# Patient Record
Sex: Male | Born: 1950
Health system: Southern US, Community
[De-identification: ages and names within clinical notes are randomized; demographics above are authoritative.]

## PROBLEM LIST (undated history)

## (undated) DIAGNOSIS — R200 Anesthesia of skin: Secondary | ICD-10-CM

## (undated) DIAGNOSIS — Z8619 Personal history of other infectious and parasitic diseases: Secondary | ICD-10-CM

## (undated) DIAGNOSIS — F419 Anxiety disorder, unspecified: Secondary | ICD-10-CM

## (undated) DIAGNOSIS — E041 Nontoxic single thyroid nodule: Secondary | ICD-10-CM

## (undated) DIAGNOSIS — F32A Depression, unspecified: Secondary | ICD-10-CM

## (undated) DIAGNOSIS — E669 Obesity, unspecified: Secondary | ICD-10-CM

## (undated) DIAGNOSIS — I10 Essential (primary) hypertension: Secondary | ICD-10-CM

## (undated) DIAGNOSIS — K5090 Crohn's disease, unspecified, without complications: Secondary | ICD-10-CM

## (undated) DIAGNOSIS — E785 Hyperlipidemia, unspecified: Secondary | ICD-10-CM

## (undated) DIAGNOSIS — Z87442 Personal history of urinary calculi: Secondary | ICD-10-CM

## (undated) DIAGNOSIS — T8859XA Other complications of anesthesia, initial encounter: Secondary | ICD-10-CM

## (undated) DIAGNOSIS — R7303 Prediabetes: Secondary | ICD-10-CM

## (undated) DIAGNOSIS — T4145XA Adverse effect of unspecified anesthetic, initial encounter: Secondary | ICD-10-CM

## (undated) DIAGNOSIS — I219 Acute myocardial infarction, unspecified: Secondary | ICD-10-CM

## (undated) DIAGNOSIS — M1990 Unspecified osteoarthritis, unspecified site: Secondary | ICD-10-CM

## (undated) DIAGNOSIS — I2510 Atherosclerotic heart disease of native coronary artery without angina pectoris: Secondary | ICD-10-CM

## (undated) DIAGNOSIS — H269 Unspecified cataract: Secondary | ICD-10-CM

## (undated) DIAGNOSIS — R0600 Dyspnea, unspecified: Secondary | ICD-10-CM

## (undated) DIAGNOSIS — M2560 Stiffness of unspecified joint, not elsewhere classified: Secondary | ICD-10-CM

## (undated) DIAGNOSIS — G4730 Sleep apnea, unspecified: Secondary | ICD-10-CM

## (undated) DIAGNOSIS — M75102 Unspecified rotator cuff tear or rupture of left shoulder, not specified as traumatic: Secondary | ICD-10-CM

## (undated) DIAGNOSIS — M7552 Bursitis of left shoulder: Secondary | ICD-10-CM

## (undated) DIAGNOSIS — F329 Major depressive disorder, single episode, unspecified: Secondary | ICD-10-CM

## (undated) DIAGNOSIS — M24112 Other articular cartilage disorders, left shoulder: Secondary | ICD-10-CM

## (undated) DIAGNOSIS — J189 Pneumonia, unspecified organism: Secondary | ICD-10-CM

## (undated) HISTORY — PX: KNEE ARTHROSCOPY: SHX127

## (undated) HISTORY — PX: LITHOTRIPSY: SUR834

## (undated) HISTORY — PX: PARTIAL NEPHRECTOMY: SHX414

## (undated) HISTORY — PX: CARDIAC CATHETERIZATION: SHX172

## (undated) HISTORY — PX: SPLENECTOMY: SUR1306

## (undated) HISTORY — PX: BIOPSY THYROID: PRO38

## (undated) HISTORY — PX: CYSTECTOMY: SUR359

---

## 1992-09-13 DIAGNOSIS — I219 Acute myocardial infarction, unspecified: Secondary | ICD-10-CM

## 1992-09-13 HISTORY — DX: Acute myocardial infarction, unspecified: I21.9

## 1996-09-24 HISTORY — PX: CARDIAC CATHETERIZATION: SHX172

## 2000-11-07 ENCOUNTER — Ambulatory Visit (HOSPITAL_COMMUNITY): Admission: RE | Admit: 2000-11-07 | Discharge: 2000-11-07 | Payer: Self-pay | Admitting: Cardiovascular Disease

## 2000-11-07 ENCOUNTER — Encounter: Payer: Self-pay | Admitting: Cardiovascular Disease

## 2000-11-07 HISTORY — PX: CARDIAC CATHETERIZATION: SHX172

## 2001-04-01 ENCOUNTER — Inpatient Hospital Stay (HOSPITAL_COMMUNITY): Admission: EM | Admit: 2001-04-01 | Discharge: 2001-04-03 | Payer: Self-pay | Admitting: Cardiovascular Disease

## 2001-04-01 ENCOUNTER — Emergency Department (HOSPITAL_COMMUNITY): Admission: EM | Admit: 2001-04-01 | Discharge: 2001-04-01 | Payer: Self-pay | Admitting: *Deleted

## 2001-04-01 ENCOUNTER — Encounter: Payer: Self-pay | Admitting: *Deleted

## 2001-04-02 HISTORY — PX: CARDIAC CATHETERIZATION: SHX172

## 2001-04-06 ENCOUNTER — Ambulatory Visit: Admission: RE | Admit: 2001-04-06 | Discharge: 2001-04-06 | Payer: Self-pay | Admitting: Cardiovascular Disease

## 2001-04-24 ENCOUNTER — Encounter: Payer: Self-pay | Admitting: Family Medicine

## 2001-04-24 ENCOUNTER — Inpatient Hospital Stay (HOSPITAL_COMMUNITY): Admission: AD | Admit: 2001-04-24 | Discharge: 2001-04-26 | Payer: Self-pay | Admitting: Pediatrics

## 2001-04-24 ENCOUNTER — Ambulatory Visit (HOSPITAL_COMMUNITY): Admission: RE | Admit: 2001-04-24 | Discharge: 2001-04-24 | Payer: Self-pay | Admitting: Family Medicine

## 2001-05-07 ENCOUNTER — Encounter: Payer: Self-pay | Admitting: Family Medicine

## 2001-05-07 ENCOUNTER — Ambulatory Visit (HOSPITAL_COMMUNITY): Admission: RE | Admit: 2001-05-07 | Discharge: 2001-05-07 | Payer: Self-pay | Admitting: Family Medicine

## 2001-05-08 ENCOUNTER — Emergency Department (HOSPITAL_COMMUNITY): Admission: EM | Admit: 2001-05-08 | Discharge: 2001-05-08 | Payer: Self-pay | Admitting: *Deleted

## 2001-05-08 ENCOUNTER — Encounter: Payer: Self-pay | Admitting: *Deleted

## 2001-05-11 ENCOUNTER — Ambulatory Visit (HOSPITAL_COMMUNITY): Admission: RE | Admit: 2001-05-11 | Discharge: 2001-05-11 | Payer: Self-pay | Admitting: General Surgery

## 2001-05-17 ENCOUNTER — Ambulatory Visit (HOSPITAL_COMMUNITY): Admission: RE | Admit: 2001-05-17 | Discharge: 2001-05-17 | Payer: Self-pay | Admitting: Cardiovascular Disease

## 2001-05-17 ENCOUNTER — Encounter: Payer: Self-pay | Admitting: Cardiovascular Disease

## 2001-05-31 ENCOUNTER — Encounter: Payer: Self-pay | Admitting: Family Medicine

## 2001-05-31 ENCOUNTER — Ambulatory Visit (HOSPITAL_COMMUNITY): Admission: RE | Admit: 2001-05-31 | Discharge: 2001-05-31 | Payer: Self-pay | Admitting: Family Medicine

## 2001-08-18 ENCOUNTER — Emergency Department (HOSPITAL_COMMUNITY): Admission: EM | Admit: 2001-08-18 | Discharge: 2001-08-18 | Payer: Self-pay

## 2001-08-18 ENCOUNTER — Encounter: Payer: Self-pay | Admitting: Emergency Medicine

## 2001-12-04 ENCOUNTER — Encounter: Payer: Self-pay | Admitting: Family Medicine

## 2001-12-04 ENCOUNTER — Ambulatory Visit (HOSPITAL_COMMUNITY): Admission: RE | Admit: 2001-12-04 | Discharge: 2001-12-04 | Payer: Self-pay | Admitting: Family Medicine

## 2001-12-14 ENCOUNTER — Encounter: Payer: Self-pay | Admitting: Urology

## 2001-12-14 ENCOUNTER — Ambulatory Visit (HOSPITAL_COMMUNITY): Admission: RE | Admit: 2001-12-14 | Discharge: 2001-12-14 | Payer: Self-pay | Admitting: Urology

## 2002-02-14 ENCOUNTER — Encounter: Payer: Self-pay | Admitting: Neurosurgery

## 2002-02-18 ENCOUNTER — Encounter: Payer: Self-pay | Admitting: Neurosurgery

## 2002-02-18 ENCOUNTER — Inpatient Hospital Stay (HOSPITAL_COMMUNITY): Admission: RE | Admit: 2002-02-18 | Discharge: 2002-02-21 | Payer: Self-pay | Admitting: Neurosurgery

## 2002-02-18 HISTORY — PX: LUMBAR LAMINECTOMY: SHX95

## 2002-02-18 HISTORY — PX: LUMBAR FUSION: SHX111

## 2002-03-11 ENCOUNTER — Encounter: Payer: Self-pay | Admitting: Urology

## 2002-03-11 ENCOUNTER — Ambulatory Visit (HOSPITAL_COMMUNITY): Admission: RE | Admit: 2002-03-11 | Discharge: 2002-03-11 | Payer: Self-pay | Admitting: Urology

## 2002-03-26 ENCOUNTER — Encounter: Payer: Self-pay | Admitting: *Deleted

## 2002-03-26 ENCOUNTER — Emergency Department (HOSPITAL_COMMUNITY): Admission: EM | Admit: 2002-03-26 | Discharge: 2002-03-26 | Payer: Self-pay | Admitting: *Deleted

## 2002-04-04 ENCOUNTER — Encounter: Payer: Self-pay | Admitting: Internal Medicine

## 2002-04-04 ENCOUNTER — Encounter (HOSPITAL_COMMUNITY): Admission: RE | Admit: 2002-04-04 | Discharge: 2002-05-04 | Payer: Self-pay | Admitting: Internal Medicine

## 2002-04-24 ENCOUNTER — Ambulatory Visit (HOSPITAL_COMMUNITY): Admission: RE | Admit: 2002-04-24 | Discharge: 2002-04-24 | Payer: Self-pay | Admitting: Family Medicine

## 2002-04-24 ENCOUNTER — Encounter: Payer: Self-pay | Admitting: Family Medicine

## 2002-05-06 ENCOUNTER — Encounter: Payer: Self-pay | Admitting: Family Medicine

## 2002-05-06 ENCOUNTER — Ambulatory Visit (HOSPITAL_COMMUNITY): Admission: RE | Admit: 2002-05-06 | Discharge: 2002-05-06 | Payer: Self-pay | Admitting: Family Medicine

## 2002-05-10 ENCOUNTER — Encounter: Payer: Self-pay | Admitting: Urology

## 2002-05-10 ENCOUNTER — Ambulatory Visit (HOSPITAL_COMMUNITY): Admission: RE | Admit: 2002-05-10 | Discharge: 2002-05-10 | Payer: Self-pay | Admitting: Urology

## 2002-05-13 ENCOUNTER — Ambulatory Visit (HOSPITAL_COMMUNITY): Admission: RE | Admit: 2002-05-13 | Discharge: 2002-05-13 | Payer: Self-pay | Admitting: Internal Medicine

## 2002-08-22 ENCOUNTER — Encounter: Payer: Self-pay | Admitting: Urology

## 2002-08-22 ENCOUNTER — Ambulatory Visit (HOSPITAL_COMMUNITY): Admission: RE | Admit: 2002-08-22 | Discharge: 2002-08-22 | Payer: Self-pay | Admitting: Urology

## 2003-01-30 ENCOUNTER — Ambulatory Visit (HOSPITAL_COMMUNITY): Admission: RE | Admit: 2003-01-30 | Discharge: 2003-01-30 | Payer: Self-pay | Admitting: Internal Medicine

## 2003-07-14 ENCOUNTER — Ambulatory Visit (HOSPITAL_COMMUNITY): Admission: RE | Admit: 2003-07-14 | Discharge: 2003-07-14 | Payer: Self-pay | Admitting: Internal Medicine

## 2003-08-28 ENCOUNTER — Ambulatory Visit (HOSPITAL_COMMUNITY): Admission: RE | Admit: 2003-08-28 | Discharge: 2003-08-28 | Payer: Self-pay | Admitting: Orthopedic Surgery

## 2003-08-28 HISTORY — PX: CARPAL TUNNEL RELEASE: SHX101

## 2003-11-05 ENCOUNTER — Ambulatory Visit (HOSPITAL_COMMUNITY): Admission: RE | Admit: 2003-11-05 | Discharge: 2003-11-06 | Payer: Self-pay | Admitting: Otolaryngology

## 2003-11-05 HISTORY — PX: CAUTERY OF TURBINATES: SHX1315

## 2003-11-05 HISTORY — PX: NASAL SEPTUM SURGERY: SHX37

## 2003-12-18 ENCOUNTER — Ambulatory Visit (HOSPITAL_BASED_OUTPATIENT_CLINIC_OR_DEPARTMENT_OTHER): Admission: RE | Admit: 2003-12-18 | Discharge: 2003-12-18 | Payer: Self-pay | Admitting: Otolaryngology

## 2004-05-18 ENCOUNTER — Ambulatory Visit (HOSPITAL_COMMUNITY): Admission: RE | Admit: 2004-05-18 | Discharge: 2004-05-18 | Payer: Self-pay | Admitting: Internal Medicine

## 2007-02-14 ENCOUNTER — Ambulatory Visit (HOSPITAL_COMMUNITY): Admission: RE | Admit: 2007-02-14 | Discharge: 2007-02-14 | Payer: Self-pay | Admitting: Internal Medicine

## 2007-12-24 ENCOUNTER — Inpatient Hospital Stay (HOSPITAL_COMMUNITY): Admission: EM | Admit: 2007-12-24 | Discharge: 2007-12-26 | Payer: Self-pay | Admitting: Emergency Medicine

## 2008-12-01 ENCOUNTER — Ambulatory Visit: Payer: Self-pay | Admitting: Orthopedic Surgery

## 2008-12-01 DIAGNOSIS — M19049 Primary osteoarthritis, unspecified hand: Secondary | ICD-10-CM | POA: Insufficient documentation

## 2008-12-01 DIAGNOSIS — M7150 Other bursitis, not elsewhere classified, unspecified site: Secondary | ICD-10-CM | POA: Insufficient documentation

## 2009-01-12 ENCOUNTER — Ambulatory Visit: Payer: Self-pay | Admitting: Orthopedic Surgery

## 2009-03-05 HISTORY — PX: TRANSTHORACIC ECHOCARDIOGRAM: SHX275

## 2009-08-17 ENCOUNTER — Ambulatory Visit: Payer: Self-pay | Admitting: Orthopedic Surgery

## 2009-08-18 ENCOUNTER — Encounter (INDEPENDENT_AMBULATORY_CARE_PROVIDER_SITE_OTHER): Payer: Self-pay | Admitting: *Deleted

## 2009-09-08 ENCOUNTER — Ambulatory Visit (HOSPITAL_COMMUNITY): Admission: RE | Admit: 2009-09-08 | Discharge: 2009-09-08 | Payer: Self-pay | Admitting: Family Medicine

## 2010-03-11 NOTE — Assessment & Plan Note (Signed)
Summary: RE-CHECK LT HAND/BRACE/UHC/CAF   Visit Type:  Follow-up  CC:  recheck left hand.  History of Present Illness: I saw [name] in the office today for a 6 WEEK  followup visit.  He is a 60 years old man with the complaint of:  LEFT HAND pain in his thumb with triggering of the RIGHT ring finger and LEFT ring finger  DX:  Arthritis and Trigger finger. [ring]  Treatment:  Injection and brace  MEDS: Aleve., ASA, Amlodipine, Ramipal, Celexa, Clonidine, Metoprolol, Zetia, MV, Zyrtec, Gemfibrozil.  Today, scheduled for: Recheck after one month of bracing and Aleve two times a day.  he has basal joint arthritis on x-ray and symptoms  Also has 2 trigger fingers RIGHT worse and LEFT  I treated him with bracing, injection and anti-inflammatories and is not improving.  I will go ahead and inject his RIGHT ring finger and the LEFT one is not completely locking so we will await.  Allergies: 1)  ! Penicillin  Physical Exam  Additional Exam:  GEN: well developed and nourished. normal body habitus, grooming and hygiene  SKIN: normal without lesions, masses, nodules NEURO/PSYCH: Normal coordination, reflexes and sensation. awake alert and oriented    LEFT  hand thumb tenderness over the base of the metacarpal with positive grind test decreased range of motion normal muscle tone in the thenar eminence stability is normal.  No swelling.  Crepitance found on range of motion.  RIGHT ring finger triggering pain over the A1 pulley catching locking tenderness.  Alignment normal.  Pulses perfusion normal.  Capillary refill normal.  Full range of motion noted.     Impression & Recommendations:  Problem # 1:  HAND, ARTHRITIS, DEGEN./OSTEO (ICD-715.94) Assessment Deteriorated  Orders: Orthopedic Surgeon Referral (Ortho Surgeon) Est. Patient Level III (99213)/Hand surgery  Problem # 2:  TRIGGER FINGER (ICD-727.3) Assessment: Comment Only  inject RIGHT ring trigger finger Verbal  consent was obtained: The finger was prepped with ethyl chloride and injected with 1:1 injection of .25% sensorcaine, 1cc  and 40 mg of depomedrol, 1cc. There were no complications.  Orders: Est. Patient Level III (21308) Injection, Tendon / Ligament (65784) Depo- Medrol 40mg  (J1030)  Patient Instructions: 1)  Refer to Dr. Amanda Pea for basilar joint arthritis left thumb 2)  Take xrays with you for appointment with the hand specialist  3)  You have received an injection of cortisone today. You may experience increased pain at the injection site. Apply ice pack to the area for 20 minutes every 2 hours and take 2 xtra strength tylenol every 8 hours. This increased pain will usually resolve in 24 hours. The injection will take effect in 3-10 days.  4)  If the other finger locks then I would recommend injection in it as well

## 2010-03-11 NOTE — Miscellaneous (Signed)
Summary: faxed referral to Dr. Amanda Pea  Clinical Lists Changes

## 2010-03-11 NOTE — Assessment & Plan Note (Signed)
Summary: left hand pain/no xr done/uhc/bsf   Vital Signs:  Patient profile:   60 year old male Weight:      210 pounds Pulse rate:   88 / minute Resp:     18 per minute  Vitals Entered By: Fuller Canada MD (December 01, 2008 4:07 PM)  Visit Type:  Initial     CC:  left hand.  History of Present Illness: I saw [name] in the office today for an initial visit.  He is a 60 years old man with the complaint of:  chief complaint: pain in left hand, thumb and wrist area. Also has trigger finger right ring finger.  pain -duration 6 months, no injury.  -location left hand, thumb and wrist area.  -severity [1-10] 9  -worsened by: has pain turning steering wheel, turning door knobs.  -improved by:  nothing.  -context:  sharp pain.  -other symptoms has numbness whole hand since last night. No bracing or injections.  -xrays done & where:  today in our office.  Meds: Amlodipine, Ramipril, Clonidine, ASA, Citalopram, Metoprolol, Zetia, MV, Gemfibrozil.  Allergies (verified): 1)  ! Penicillin  Past History:  Past Medical History: heart attack 1994 htn chest pains cholesterol depression hearing loss  Past Surgical History: spllen removed partial kidney removed pylenodal cysts bilateral CTR back surgery 2004 nasal surgery 2005 rt knee arthroscopy 2008.  Family History: Family History of Diabetes Family History Coronary Heart Disease male < 72 Family History of Arthritis Hx, family, chronic respiratory condition  Social History: Patient is married.  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 Cardiac :  Complains of heart attack; denies chest pain, angina, heart failure, poor circulation, blood clots, and phlebitis. Resp:  Complains of short of breath; denies difficulty breathing, COPD, cough, and  pneumonia. GI:  Complains of constipation and reflux; denies nausea, vomiting, diarrhea, difficulty swallowing, ulcers, and GERD. GU:  Denies kidney failure, kidney transplant, kidney stones, burning, poor stream, testicular cancer, blood in urine, and ; dribbling. Neuro:  Complains of headache and numbness; denies dizziness, migraines, weakness, tremor, and unsteady walking. MS:  Complains of joint pain and joint swelling; denies rheumatoid arthritis, gout, bone cancer, osteoporosis, and . Endo:  Denies thyroid disease, goiter, and diabetes. Psych:  Complains of depression; denies mood swings, anxiety, panic attack, bipolar, and schizophrenia. Derm:  Complains of eczema and itching; denies cancer. EENT:  Complains of ears ringing; denies poor vision, cataracts, glaucoma, poor hearing, vertigo, sinusitis, hoarseness, toothaches, and bleeding gums; hearing loss. Immunology:  Denies seasonal allergies, sinus problems, and allergic to bee stings. Lymphatic:  Denies lymph node cancer and lymph edema.  Physical Exam  Additional Exam:  GEN: well developed and nourished. normal body habitus, grooming and hygiene CDV: normal pulses perfusion color and temperature without swelling or edema Lymph: normal lymph system SKIN: normal without lesions, masses, nodules NEURO/PSYCH: Normal coordination, reflexes and sensation. awake alert and oriented  XBJ:[id number] normal  RIGHT hand thumb tenderness over the base of the metacarpal with positive grind test decreased range of motion normal muscle tone in the thenar eminence stability is normal.  No swelling.  Crepitance found on range of motion.  RIGHT ring finger triggering pain over the A1 pulley catching locking tenderness.  Alignment normal.  Pulses perfusion normal.  Capillary refill normal.  Full range of motion noted.  Impression & Recommendations:  Problem # 1:  HAND, ARTHRITIS, DEGEN./OSTEO (ICD-715.94)  Orders: New Patient Level IV  (16109) Hand x-ray, minimum 3 views (73130) radiographs do show that there are degenerative changes at the base of the first metacarpal and the carpal joint  Impression osteoarthritis carpal joint first digit/thumb.  Problem # 2:  TRIGGER FINGER (ICD-727.3)  Orders: New Patient Level IV (60454) Depo- Medrol 40mg  (J1030) Joint Aspirate / Injection, Small (20600)inject the LEFT trigger finger  Verbal consent was obtained: The finger was prepped with ethyl chloride and injected with 1:1 injection of .25% sensorcaine, 1cc  and 40 mg of depomedrol, 1cc. There were no complications.  Hand x-ray, minimum 3 views (73130)  Patient Instructions: 1)  You have received an injection of cortisone today. You may experience increased pain at the injection site. Apply ice pack to the area for 20 minutes every 2 hours and take 2 xtra strength tylenol every 8 hours. This increased pain will usually resolve in 24 hours. The injection will take effect in 3-10 days.  2)  brace left thumb during non work hours  3)  take aleve two times a day for 4 weeks  4)  return in 6 weeks

## 2010-03-11 NOTE — Letter (Signed)
Summary: History form  History form   Imported By: Jacklynn Ganong 12/03/2008 12:43:51  _____________________________________________________________________  External Attachment:    Type:   Image     Comment:   External Document

## 2010-06-22 NOTE — H&P (Signed)
[name], [name] NO.:  [id number]   MEDICAL RECORD NO.:  [id number]          PATIENT TYPE:  EMS   LOCATION:  MAJO                         FACILITY:  [facility]   PHYSICIAN:  [name], M.D.   DATE OF BIRTH:  January 21, 1951   DATE OF ADMISSION:  12/24/2007  DATE OF DISCHARGE:                              HISTORY & PHYSICAL   HISTORY OF PRESENT ILLNESS:  [name] is a 60 year old male who is  followed by Dr. Alanda Mckee and Dr. Sherwood Mckee in [place].  He has a history  of coronary disease.  He had a remote PMI treated with PTCA in 1994.  Restudy in 2003 showed total occluded distal right with left-to-right  collaterals.  He did have some moderate disease in the LAD of 40% with  good LV function.  He had a Myoview in June 2007 that showed no ischemia  with an EF of 54%.  He does say he has occasional chest pain although it  is hard for him to say this is always exertional or typical.  He says  once in awhile he has to take a nitroglycerin.  He works as a Curator.  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  He got nauseated and mildly  flushed.  He went to the bathroom and took a nitroglycerin with partial  relief.  He went by to see the office nurse and is now seen in the  emergency room at [facility].  Currently he is pain free.  He is admitted now  for further evaluation.  He denies any recent increase in his angina  although this episode was unusual for him.   PAST MEDICAL HISTORY:  Remarkable for treated hypertension.  He has  dyslipidemia with a history of statin intolerance.   CURRENT MEDICATIONS:  Aspirin 81 mg a day, Altace 10 mg a day, Celexa 10  mg a day, clonidine 0.1 mg b.i.d., fish oil 1 gram b.i.d., multivitamin  daily, Tricor 145 daily, Zetia 10 mg a day and Crestor 5 mg once a week.   ALLERGIES:  He is allergic to PENICILLIN and intolerant to STATINS and  NIACIN.   SOCIAL HISTORY:  He is  married and he has two children, three  grandchildren.  He quit smoking in 1994 when he had his heart attack.  He works as a Curator at [employer].   FAMILY HISTORY:  Unremarkable.   REVIEW OF SYSTEMS:  Unremarkable except for above.  He denies any GI  bleeding or melena.  Has not had thyroid disease or diabetes.   PHYSICAL EXAMINATION:  VITAL SIGNS:  Blood pressure 122/68, pulse 62,  temperature 98.1, O2 sat is 97% on 2 liters.  GENERAL:  He is well-developed, well-nourished male in no acute  distress.  HEENT: Normocephalic.  Extraocular movements are intact.  Sclerae is  nonicteric.  Conjunctivae are within normal limits.  NECK:  Without bruit and without JVD.  CHEST: Clear to auscultation and percussion.  CARDIAC EXAM:  Reveals regular rate and rhythm without murmur, rub  or  gallop.  Normal S1 and S2.  ABDOMEN:  Nontender.  No hepatosplenomegaly.  EXTREMITIES:  Without edema.  Distal pulses are intact.  There are no  femoral artery bruits noted.  NEURO:  Exam grossly intact.  He is awake, alert and oriented and  operative.  Moves all extremities without obvious deficit.  SKIN:  Skin is warm and dry.   EKG shows sinus rhythm without acute changes.   LABS:  Sodium 139, potassium 4.0, BUN 14, creatinine 1.2, hemoglobin  14.6, hematocrit 43, Troponin is negative times one.   IMPRESSION:  1. Unstable angina.  2. Known coronary disease with remote PMI treated with percutaneous      transluminal coronary angioplasty in 1994, catheterization in 2003      showing occluded right coronary artery with left-to-right      collaterals and 40% left anterior descending, treated medically,      last Myoview was unremarkable for significant ischemia in 2007.  3. Dyslipidemia with history of statin intolerance.  4. Hypertension, currently treated.  5. Remote smoker.   PLAN:  The patient was seen by Dr. Allyson Mckee and myself today in the office.  He will be admitted to telemetry, started on  heparin and nitrates.  We  will go ahead and set him up for restudy tomorrow.      [name], P.A.      [name], M.D.  Electronically Signed    LKK/MEDQ  D:  12/24/2007  T:  12/24/2007  Job:  [id number]

## 2010-06-22 NOTE — Cardiovascular Report (Signed)
NAMENAJEE, MANNINEN NO.:  [id number]   MEDICAL RECORD NO.:  [id number]          PATIENT TYPE:  INP   LOCATION:  3738                         FACILITY:  [facility]   PHYSICIAN:  Nicki Guadalajara, M.D.     DATE OF BIRTH:  1950-04-02   DATE OF PROCEDURE:  DATE OF DISCHARGE:                            CARDIAC CATHETERIZATION   PROCEDURES:  Left heart catheterization:  Cine coronary angiography;  biplane cine left ventriculography; distal aortography.   INDICATIONS:  [name] is a 60 year old gentleman who suffered an  inferior wall myocardial infarction in 1994, treated with PTCA of his  right coronary artery.  He subsequently developed silent re-occlusion  and was found to have extensive collaterals on subsequent  catheterization studies.  His last catheterization was done by Dr.  Alanda Amass in February 2003.  The patient had been doing well on medical  therapy.  He recently developed an episode of substernal chest tightness  while at work radiating to his jaw.  He was admitted to [facility] on December 24, 2007.  Cardiac enzymes have been negative.  He  is now referred for definitive repeat cardiac catheterization.   PROCEDURE:  After premedication with Valium 5 mg intravenously, the  patient was prepped and draped in the usual fashion.  His right femoral  artery was punctured anteriorly and a 5-French sheath was inserted.  Diagnostic catheterization was done utilizing 5-French Judkins for left  and right coronary catheters.  A 5-French pigtail catheter was used for  biplane cine left ventriculography as well as distal aortography.  Hemostasis was obtained by direct manual pressure.  The patient  tolerated the procedure well.   HEMODYNAMIC DATA:  Central aortic pressure was 170/96.  Left ventricular  pressure was 170/14.   ANGIOGRAPHIC DATA:  Left main coronary artery was angiographically  normal and bifurcated into a large LAD and left circumflex  system.   The LAD was a moderate-sized vessel, which extended to and wrapped  around the LV apex.  Gave rise to a proximal diagonal vessel, which had  smooth 20% proximal narrowing.  The first septal perforating artery was  a large vessel.  The LAD extended to and wrapped around the LV apex.  There was collateralization to the PLA branch of the distal RCA via the  LAD.   The circumflex vessel was angiographically normal and gave rise to 2  small marginal vessels as well as a large marginal vessel, which  extended to the apex.  The AV groove circumflex as well as an additional  branch also supplied collaterals to the distal right coronary artery and  well visualized were a PLA, 2 PD branches, and there was filling of the  RCA to the mid segment.   The right coronary artery was a moderate-sized vessel, which was totally  occluded after giving rise to a marginal branch in the mid segment.  There was 40% proximal RCA narrowing and 40-50% narrowing in the RCA  prior to giving rise to this branch and then total occlusion.  As noted  above, there was brisk left-to-right  collaterals to the distal RCA via  the left injection.   Biplane cine left ventriculography revealed preserved global  contractility with an ejection fraction of at least 50-55%, but there  was mid-to-basal hypocontractility on the RAO projection and  hypocontractility in the inferoapical segment on the LAO projection.   Distal aortography revealed widely patent renal arteries without renal  artery stenosis.  There was no evidence for any aneurysm formation in  the abdominal aorta.  The iliac system was suboptimally visualized, but  no definitive stenosis was demonstrated.   IMPRESSION:  1. Preserved global contractility with mild mid-to-basal inferior and      inferoapical hypocontractility.  This patient is status post remote      inferior wall myocardial infarction in August 1994 treated with      percutaneous  transluminal coronary angioplasty of his right      coronary artery.  2. Minimal 20% narrowing in the first diagonal branch of the left      anterior descending, but otherwise, normal left anterior descending      system.  3. Large normal left circumflex system.  4. Total occlusion of the mid right coronary artery with extensive      left-to-right collaterals from at least 3 resources rising from the      left anterior descending and 2 branches of the circumflex vessel      supplying the distal right coronary artery, which is collateralized      essentially up to its point of near occlusion in its midsegment.  5. No evidence for renal artery stenosis.  6. Systemic hypertension.   RECOMMENDATIONS:  Medical therapy.           ______________________________  Nicki Guadalajara, M.D.     TK/MEDQ  D:  12/25/2007  T:  12/25/2007  Job:  [id number]   cc:   Gerlene Burdock A. Alanda Amass, M.D.  Madelin Rear. Sherwood Gambler, MD

## 2010-06-25 NOTE — H&P (Signed)
[facility]  Patient:    [name], [name] Visit Number: [id number] MRN: [id number]          Service Type: OUT Location: RAD Attending Physician:  Darlin Priestly Dictated by:   Franky Macho, M.D. Admit Date:  05/07/2001   CC:         Patrica Duel, M.D.   History and Physical  AGE:  60 years old.  CHIEF COMPLAINT:  Dysphagia, hematochezia.  HISTORY OF PRESENT ILLNESS:  The patient is a 60 year old white male who is referred for endoscopic evaluation.  He has had multiple episodes of chest pain and dysphagia recently.  He has had two cardiac catheterizations, both of which were unremarkable.  He was seen in the emergency room earlier this week with chest pain which radiates to his shoulders.  He has had some swallowing difficulties.  No significant heartburn has been noted.  It feels like something gets stuck in his throat.  No nausea, vomiting, epigastric pain, fever, or chills have been noted.  No melena has been noted.  He has passed blood in the stools in the past.  He has never had a colonoscopy.  He denies any hemorrhoidal problems.  There is no family history of colon carcinoma.  PAST MEDICAL HISTORY: 1. Hypertension. 2. Coronary artery disease.  PAST SURGICAL HISTORY: 1. Splenectomy and partial kidney removal in the 1960s. 2. Pilonidal cyst excision.  CURRENT MEDICATIONS: 1. Toprol XL 50 mg p.o. q.d. 2. Baby aspirin 1 tablet p.o. q.d. 3. Imdur 30 mg p.o. q.d. 4. Altace 2.5 mg p.o. q.d. 5. Norvasc 5 mg p.o. q.d. 6. Lexapro 10 mg p.o. q.d. 7. Nexium 1 tablet p.o. b.i.d.  ALLERGIES:  PENICILLIN and NIACIN.  REVIEW OF SYSTEMS:  As noted above.  SOCIAL HISTORY:  The patient denies drinking or smoking.  PHYSICAL EXAMINATION:  GENERAL:  Well-developed, well-nourished white male in no acute distress.  VITAL SIGNS:  Afebrile, vital signs stable.  NECK:  Supple.  Without lymphadenopathy.  LUNGS:  Clear to auscultation, with  equal breath sounds bilaterally.  HEART:  Regular rate and rhythm without S3, S4, or murmurs.  ABDOMEN:  Soft, nontender, nondistended.  No hepatosplenomegaly or masses are noted.  RECTAL:  Deferred until the procedure.  IMPRESSION:  Dysphagia, chest pain of noncardiac nature, hematochezia.  PLAN:  The patient is scheduled for an EGD and colonoscopy on May 11, 2001. The risks and benefits of the procedure including bleeding and perforation were fully explained to the patient, who gave informed consent. Dictated by:   Franky Macho, M.D. Attending Physician:  Darlin Priestly DD:  05/10/01 TD:  05/10/01 Job: [id number]

## 2010-06-25 NOTE — Procedures (Signed)
   [name], [name]                           ACCOUNT NO.:  [id number]   MEDICAL RECORD NO.:  [id number]                   PATIENT TYPE:  EMS   LOCATION:  ED                                   FACILITY:  [facility]   PHYSICIAN:  Edward L. Juanetta Gosling, M.D.             DATE OF BIRTH:  January 11, 1951   DATE OF PROCEDURE:  03/26/2002  DATE OF DISCHARGE:  03/26/2002                                EKG INTERPRETATION   TIME:  1203 hours   DATE:  03/26/02   INTERPRETATION:  The rhythm is sinus rhythm with a rate in the 60s.  There  are small Q waves inferiorly which may indicate a previous inferior  myocardial infarction but these are rather small and may be of no  significance.   IMPRESSION:  Clinical correlation is suggested.                                                 Edward L. Juanetta Gosling, M.D.    ELH/MEDQ  D:  03/26/2002  T:  03/26/2002  Job:  [id number]

## 2010-06-25 NOTE — Op Note (Signed)
NAMENESHAWN, AIRD                           ACCOUNT NO.:  [id number]   MEDICAL RECORD NO.:  [id number]                   PATIENT TYPE:  AMB   LOCATION:  DAY                                  FACILITY:  [facility]   PHYSICIAN:  Vickki Hearing, M.D.           DATE OF BIRTH:  [birth year]   DATE OF PROCEDURE:  DATE OF DISCHARGE:                                 OPERATIVE REPORT   PREOPERATIVE DIAGNOSIS:  Carpal tunnel syndrome, right upper extremity.   POSTOPERATIVE DIAGNOSIS:  Carpal tunnel syndrome, right upper extremity.   PROCEDURE:  Open right carpal tunnel release.   SURGEON:  Vickki Hearing, M.D.   ANESTHESIA:  Regional block using a Bier block technique.   FINDINGS:  Stenosis of the carpal tunnel, compression of the median nerve.  Otherwise, the carpal tunnel contents were normal.   HISTORY:  This is a 60 year old male with tingling and numbness in the  median nerve distribution of the right upper extremity.  He had a nerve  conduction study confirming his carpal tunnel syndrome.  He had underlying  axonal neuropathy with some mild-to-moderate amplitude latency on the motor  side of the test.  He presented for carpal tunnel release.  Primary  indication was pain and weakness.   DESCRIPTION OF PROCEDURE:  Mr. Laboy was identified in the preoperative  holding area.  He marked his operative site.  I signed my initials over the  operative site.  He was given Ancef and taken to the operating room for a  Bier block.  After successful anesthesia, the required timeout was taken.  Everyone agreed with the procedure, extremity, and the patient's identity as  Riker Collier.   We made an incision over the carpal tunnel and divided the subcutaneous  tissue down in the palmar fascia.  The palmar fascia was dissected until the  transverse carpal ligament was identified at its distal extent.  Blunt  dissection was performed beneath the ligament, and the ligament was  released.  The  median nerve was found.  Findings are as noted.  The carpal  tunnel was irrigated, and the wound was closed with a running 3-0 nylon  suture.  We then injected 10 cc of 0.5% Sensorcaine plain, covered the wound  with sterile dressings, and released the tourniquet.  The fingers had good  capillary refill and perfusion.   The patient was taken to the recovery room in stable condition.   Follow up will be on Monday for dressing change.  He will be discharged on  Lorcet Plus one q.4h. p.r.n., #60 with two refills.      ___________________________________________                                            Vickki Hearing, M.D.   SEH/MEDQ  D:  08/28/2003  T:  08/28/2003  Job:  [id number]

## 2010-06-25 NOTE — Op Note (Signed)
[name], [name]                 ACCOUNT NO.:  [id number]   MEDICAL RECORD NO.:  [id number]          PATIENT TYPE:  OIB   LOCATION:  2908                         FACILITY:  [facility]   PHYSICIAN:  Kinnie Scales. Annalee Genta, M.D.DATE OF BIRTH:  22-Nov-1950   DATE OF PROCEDURE:  11/05/2003  DATE OF DISCHARGE:                                 OPERATIVE REPORT   PREOPERATIVE DIAGNOSES:  1.  Deviated nasoseptum with nasal obstruction.  2.  Turbinates hypertrophy.  3.  Severe, obstructive sleep apnea.  4.  History of coronary artery disease.   POSTOPERATIVE DIAGNOSES:  1.  Deviated nasoseptum with nasal obstruction.  2.  Turbinates hypertrophy.  3.  Severe, obstructive sleep apnea.  4.  History of coronary artery disease.   PROCEDURE:  1.  Nasoseptoplasty.  2.  Bilateral inferior turbinates intramural cautery.   ANESTHESIA:  General endotracheal.   SURGEON:  Kinnie Scales. Annalee Genta, M.D.   COMPLICATIONS:  None.   ESTIMATED BLOOD LOSS:  Less than 50 cc.   DISPOSITION:  The patient was transferred from the operating room to the  recovery room in stable condition.   INDICATIONS FOR PROCEDURE:  1.  Deviated nasoseptum with nasal obstruction.  2.  Turbinates hypertrophy.  3.  Severe, obstructive sleep apnea.  4.  History of coronary artery disease.   Mr. Highley is a 60 year old white male who is referred for evaluation of nasal  airway obstruction and obstructive sleep apnea.  The patient was diagnosed  with severe obstructive sleep apnea and significant past medical history of  coronary artery disease.  He was titrated on CPAP. He has been wearing the  CPAP on a consistent basis but was having significant difficulty secondary  to nasal airway obstruction. Given his history and examination, I  recommended that we consider him for nasoseptoplasty and turbinates  reduction. The risks, benefits and possible complications of procedure were  discussed in detail with the patient and his family, who  understood and  concurred with our plan for surgery, which was scheduled as above.   DESCRIPTION OF PROCEDURE:  The patient was brought to the operating room on  November 05, 2003 and placed in the supine position on the operating table.  General endotracheal anesthesia was established without difficulty. When the  patient was adequately anesthetized, his nose was injected with 7 cc of 1%  lidocaine with 1:100,000 solution epinephrine, injected in a submucosal  fashion along the nasoseptum and inferior turbinates bilaterally.  The  patient's nose was then packed with Afrin soaked cottonoid pledgets, which  were left in place for approximately 10 minutes to allow for  vasoconstriction and hemostasis.  The patient was prepped and draped in a  sterile fashion, and surgical procedure was begun with bilateral nasal  exams.  [name] has a severely deviated septum with left septal deviation  and significant septal spurring on the right, with bilateral inferior  turbinates hypertrophy.   The procedure was begun by creating a right hemitransfixion incision.  Incision was carried through the mucosa and underlying submucosa and a  mucoperiosteal flap was elevated from anterior  to posterior on the patient's  right-hand side.  Bony cartilaginous junction was crossed in the midline and  a mucoperiosteal flap was elevated on the patient's left.  Deviated bone and  cartilage in the mid and posterior aspect of the nasoseptum were then  resected using a swivel knife and through-cutting forceps.  A large, septal  spur was identified and the overlying mucosa was elevated and preserved.  A  4 mm osteotome was used to remove the bony, septal spur.  With septum  brought to the midline, mid-septal cartilage was morcellized and returned to  the mucoperichondrial pocket.  The mucoperichondrial flaps were  reapproximated with a 4-0 gut suture on a Keith in [employer]  fashion.  Bilateral Doyle nasal  splints were placed after the application of  Bactroban ointment and sutured in position with a 3-0 Ethilon suture.   Bilateral inferior turbinates intramural cautery was then performed with the  cautery set at 12 watts. Two passes were made in a submucosal fashion in  each inferior turbinate. When the inferior turbinates had been adequately  cauterized, they were out-fractured to create a more patent nasal cavity.  Patient's nasal cavity, oral cavity, nasopharynx, oropharynx were then  irrigated and suctioned. An orogastric tube was passed and the stomach  contents were aspirated.  The patient was then awakened from his anesthetic,  extubated and transferred from the operating room to the recovery room in  stable condition. There were no complications, and blood loss was  approximately 50 cc.       DLS/MEDQ  D:  08/65/7846  T:  11/05/2003  Job:  [id number]

## 2010-06-25 NOTE — Cardiovascular Report (Signed)
[place]  Patient:    [name], [name] Visit Number: [id number] MRN: [id number]          Service Type: MED Location: 2000 2099 [id number] Attending Physician:  Virgina Evener Dictated by:   Pearletha Furl Alanda Amass, M.D. Proc. Date: 04/02/01 Admit Date:  04/01/2001   CC:         CP Lab  Lennette Bihari, M.D.  Record Room  Dr. Jodelle Green c/o [facility] in [place], [place]   Cardiac Catheterization  PROCEDURE:  Retrograde central aortic catheterization, selective coronary angiography by Judkins technique, LV angiogram, RAO and LAO projection.  A 6 French Perclose single stitch CRFA closure device deployed successfully.  DESCRIPTION OF PROCEDURE:  The patient was brought to the second floor CP lab in a post-absorptive state after 5 mg of Valium p.o. premedication.  He was hydrated preoperatively with normal creatinine.  Heparin was on hold.  The right groin was prepped and draped in the usual manner.  Xylocaine 1% was used as local anesthesia.  The CRFA was entered with a single anterior puncture using an 18 thin wall needle, and a 6 French short dage sidearm sheath was inserted without difficulty.  Catheterization was performed with 6 French 4 cm taper pre-form Cortis coronary pigtail catheters using Omnipaque dye.  The LV angiogram was done in the RAO and LAO projection at 25 cc, 14 cc per second, and 20 cc, 12 cc per second, respectively.  Pullback pressure of the CA showed no gradient across the aortic valve.  Catheter was removed.  Sidearm sheath was flushed, pending review of the patients ______ angiograms.  Following this, hand injection of the CRFA was done in the angled projection to confirm common femoral puncture, and then the 6 French Perclose device was deployed with single suture technique successfully.  The patient was transferred to the holding area for postoperative care in stable condition with intact  RLE pulses.  PRESSURES: 1. LV:  110/0; LVEDP 18 mmHg. 2. CA:  110/70 mmHg. 3. There was no gradient across the aortic valve on catheter pull back.  Fluoroscopy revealed +1 proximal left anterior descending artery and right coronary artery calcifications.  There was no intracardiac or valvular calcification seen.  LV angiogram in the RAO and LAO projection showed hypo-akinesis of the basilar third to one half of the inferior wall, and no significant abnormality in the LAO projection.  Ejection fraction greater than 55% with no mitral regurgitation.  Prior to catheterization revealed normal renal arteries and IMAs (10/02).  The main left coronary is normal.  The left anterior descending artery has a 30 to 40% smooth mildly segmental narrowing at the junction of the proximal mid third after the moderately large first diagonal branch.  There was another tandem eccentric 40% narrowing just beyond this that was smooth and essentially unchanged.  There was 50% narrowing with mild hypodensity in the very distal quarter of the left anterior descending artery before the apical bifurcation.  The first diagonal was moderately large.  There was 30 to 40% narrowing before the bifurcation.  The circumflex was a moderately large vessel.  There was a small first marginal that was normal.  A small second marginal that was normal.  There was 30% narrowing of the right coronary artery after the second marginal.  The distal right coronary artery bifurcated with no significant stenosis, and was a predominant marginal branch.  There were three PABG branches.  One supplied direct collaterals to the  distal right coronary artery, and the other one supplied indirect collaterals.  The PLA and PDA of the RCA were visualized up to the bifurcation (grade III collaterals).  The RCA showed 70% narrowing, smooth before the acute marginal branch.  The acute marginal branch had a 70% proximal narrowing.   Beyond this, was 99 then 100% narrowing in a recanalization area.  There was only faint filling up to the acute margin, but the vessel appeared to be diffusely diseased up to the distal vessel.  This 60 year old white married father of two with three grandchildren quit smoking in 1994.  Still works for [employer] [name].  He suffered a DMI in 1994, treated with emergency percutaneous transluminal coronary angioplasty.  He had subsequently silent reocclusion following that, evident on past angiography. He has a history of intermittent chest pain, gastroesophageal reflux disease, remote cigarette abuse, hyperlipidemia, and hypertension.  He was admitted with chest pain on 04/02/01, with myocardial infarction ruled out by serial enzymes and EKGs.  There was no significant change on angiography from prior angiogram of 11/07/00.  There were non-jeopardized collaterals to the distal right coronary artery.  His distal right coronary artery does not appear to be a candidate for possible recanalization of his CTO because of diffuse nature of the disease as evident angiographically.  He has noncritical left coronary disease, and was recommend reassurance.  He may need to be considered for GI evaluation if he has recurrent chest pain.  Overall ejection fraction is well preserved.  Continue medical therapy.  CATHETERIZATION DIAGNOSES: 1. Arteriosclerotic heart disease, remote DMI on 09/13/92, treated with    emergency percutaneous transluminal coronary angioplasty, subsequent silent    reocclusion with grade III collaterals, chronic. 2. Remote smoker, quit in 1994. 3. Hyperlipidemia. 4. Systemic hypertension on therapy. 5. History of headaches. 6. Benign prostatic hypertrophy. 7. 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  8. Chest pain, etiology determined, possible gastroesophageal reflux disease    and/or esophageal spasm. Dictated by:   Pearletha Furl Alanda Amass, M.D. Attending Physician:  Virgina Evener DD:  04/02/01 TD:  04/02/01 Job: [id number]

## 2010-06-25 NOTE — Discharge Summary (Signed)
[place]  Patient:    [name], [name] Visit Number: [id number] MRN: [id number]          Service Type: MED Location: [id number] Attending Physician:  Netta Cedars Dictated by:   Raymon Mutton, P.A. Admit Date:  04/24/2001 Discharge Date: 04/26/2001   CC:         Zigmund Daniel, M.D.  Netta Cedars, M.D.  Richard A. Alanda Amass, M.D.   Discharge Summary  DATE OF BIRTH:  December 15, 1950.  ADMITTING PHYSICIAN:  Netta Cedars, M.D.  DISCHARGING PHYSICIAN:  Lenise Herald, M.D.  [facility] CHART NUMBER:  [phone]  DISCHARGE DIAGNOSES: 1. Right groin hematoma after cardiac catheterization, status post    surgical consult with Zigmund Daniel, M.D. 2. Coronary artery disease status post remote myocardial infarction in 1994    and status post recent catheterization on March 31, 2001, by    Richard A. Alanda Amass, M.D.  Medical therapy was recommended. 3. Hypertension. 4. Hyperlipidemia. 5. Remote traumatic splenectomy at age 72. 6. Chest pain determined possible gastroesophageal reflux disease and/or    esophageal spasm.  HISTORY OF PRESENT ILLNESS:  Mr. Larke is a 60 year old Caucasian gentleman was seen in our office on April 24, 2001, with complaints of pain in the right groin with large lump.  He said that he noticed small knot at the groin right after the discharge and he was seen in the office on April 06, 2001, a few days after his catheterization and it was thought that the patient had probably small palpable lymph node and no bruits were heard at that time. Ultrasound was without any abnormalities at that time per the patient.  Since that time he did somewhat better and even went on vacation to [name] with his wife but midway through his vacation, the small knot in the groin started enlarging and when he came back it was evaluated by Jonell Cluck, M.D., by CT scan  and it showed right groin hematoma with surrounding edema and hematoma was measured was measured at 7/2.6/3 cm.  The patient was admitted to the hospital on the same day and surgical consult was ordered.  Dr. Orson Slick saw the patient and based on the ultrasound evaluation, pseudoaneurysm or AV fistula was ruled out but ultrasound confirmed diagnosis of hematoma that measures 2.6/1.24 cm.  The hematoma spontaneously drained and the discharge was dark red, no fresh bright blood was seen suggestive of pseudoaneurysm. Blood culture reports were sent. The patient stayed afebrile and by the time of discharge he said that he ambulates with less difficulty than before and the pain subsided. At the time of discharge, the wound culture was pending. Dr. Orson Slick saw the patient and found him stable for discharge.  RECOMMENDATIONS:  Avoid any vigorous activities and heavy lifting for probably one week and no bathtubs or swimming until the wound heals. The patient was allowed to take showers starting tomorrow. He also was instructed to contact Dr. Marcellina Millin office if pain increases or if swelling gets worse or some bloody or purulent discharge appears again.  CONDITION ON DISCHARGE:  He was sent home in stable condition.  DISCHARGE MEDICATIONS: 1. Altace 2.5 mg one pill daily. 2. Norvasc 5 mg one pill daily. 3. Toprol XL 50 mg q.d. 4. Imdur 30 mg q.d. 5. Protonix 40 mg one to two pills daily. 6. The patient was instructed to start on aspirin 81 mg one pill daily.  FOLLOW-UP:  He will follow up with Dr. Orson Slick p.r.n. Dictated by:   Raymon Mutton, P.A. Attending Physician:  Netta Cedars DD:  04/26/01 TD:  04/27/01 Job: [id number]

## 2010-06-25 NOTE — Op Note (Signed)
NAMEOLUWASEMILORE, BAHL                           ACCOUNT NO.:  [id number]   MEDICAL RECORD NO.:  [id number]                   PATIENT TYPE:  INP   LOCATION:  3006                                 FACILITY:  [facility]   PHYSICIAN:  Hewitt Shorts, M.D.            DATE OF BIRTH:  [birth year]   DATE OF PROCEDURE:  02/18/2002  DATE OF DISCHARGE:                                 OPERATIVE REPORT   PREOPERATIVE DIAGNOSES:  L4-5 lumbar disk herniation, lumbar stenosis,  lumbar spondylosis, and lumbar degenerative disk disease.   POSTOPERATIVE DIAGNOSES:  L4-5 lumbar disk herniation, lumbar stenosis,  lumbar spondylosis, and lumbar degenerative disk disease.   PROCEDURE:  Lateral L4-5 lumbar laminotomy, facetectomy, microdiskectomy,  and posterior lumbar interbody fusion with split femoral ring allograft and  V-Toss with bone marrow aspirate and posterolateral arthrodesis with 90-D  posterior instrumentation, VITOSS, and bone marrow aspirate.   SURGEON:  Hewitt Shorts, M.D.   ASSISTANT:  Coletta Memos, M.D.   ANESTHESIA:  General endotracheal.   INDICATIONS:  The patient is a 60 year old man who presented with back and  left lower extremity pain and was found L4-5 disk herniation, broad based,  worse to the left, with resulting stenosis at the L4-5 level.  The decision  was made to proceed with decompression and arthrodesis.   PROCEDURE:  The patient was brought to the operating room and placed under  general endotracheal anesthesia.  The patient was turned to the prone  position.  The lumbar region was prepped with Betadine soap and solution and  draped in a sterile fashion.  The midline was infiltrated with local  anesthetic with epinephrine.  A midline incision was made and carried down  to the subcutaneous tissue.  Bipolar cautery and electrocautery was used to  make hemostasis.  Dissection was carried down to the lumbar fascia, which  was incised bilaterally, and the paraspinal  muscle was recessed to the  spinous process of the lamina in a subperiosteal fashion.  Localizing x-rays  were taken.  The L4-5 intralaminar space was identified.  Dissection was  carried out laterally, exposing the transverse processes of L4 and L5  bilaterally.  We then proceeded with a bilateral laminotomy and facetectomy  using the [facility] Max drill and Kerrison punches.  The microscope was draped  and brought into the field for provide magnification, illumination, and  visualization, and the remainder of the diskectomy was performed using  microdissection and microsurgical technique.  The ligamentum flavum was  carefully resected, and we identified the thecal sac and L4 and L5 nerve  roots bilaterally.  The annulus of L4-5 was also identified and dissection  was begun initially on the left side with a subligamentous disk fragment  extruding upon incision of the annulus. We entered into the disk space and  proceeded with a thorough diskectomy, removing all loose fragments of disk  material.  We then incised  the annulus on the right side, and similarly, the  diskectomy was performed using a variety of microcurettes and pituitary  rongeurs.  Posterior spinous overgrowth was removed bilaterally at the level  of the disk space, and then the disk space was further prepared for the  interbody fusion.  We placed a 12 mm distractor on the right side.  We took  a 12 mm ring of femoral allograft.  It was cut and shaped using an  oscillating saw, as well as the [facility] Max drill, and the graft was  positioned within the intervertebral disk space and countersunk.  We then  removed the distractor from the right side, again prepared the end plates  for the interbody arthrodesis, and placed the interbody graft and again  countersunk it.  The wound was then irrigated with Bacitracin solution, and  the C-arm fluoroscopy unit was brought in to help guide the placement of  bilateral pedicle screws at L4-L5.   At each site, the pedicle entry site was  identified with the C-arm fluoroscopy, the cortex perforated with the [facility] drill, and then a pedicle probe was passed down through the pedicle into  the vertebral body.  We then examined the probed hole to make sure that  there were no break-outs. We used a 5.25 tap to thread the pedicles, and  then a 6.75 x 45 mm screw was placed at each level after having again  checked the threaded hole for cut-outs.  None were found at any level.  All  four screws were placed.  We then selected 40 mm rods, which were cut to fit  the construct and to avoid excessive rod length.  Two rods were placed and  the locking caps placed, and then using the counter torque with the final  tightener, all four locking caps were secured.  We then exposed the  transverse processes in the intertransverse space at L4-5.  The transverse  processes were decorticated, and VITOSS, which had been soaked in bone  marrow aspirate, which had been aspirated through the probed pedicles, was  packed in the lateral gutters and also in the intervertebral disk space  lateral to the interbody grafts.  Care was taken to ensure that no VITOSS  was against the thecal sac or nerve roots.  Of note, during the laminectomy  portion, a small rent in the dura occurred.  This was closed with a 6-0  Prolene suture.  The patient underwent Valsalva and a good, watertight  closure was noted.  After the VITOSS had been packed in the various areas,  we did inject Tisseel over the dural repair.  The wound was then closed in  multiple layers with the deep fascia closed with interrupted undyed 1 Vicryl  sutures.  The deep subcutaneous tissue was closed with interrupted undyed 1  Vicryl sutures, the subcutaneous and subcuticular closure with interrupted  inverted 2-0 Vicryl  sutures, and the skin was reapproximated with  Dermabond.  The patient tolerated the procedure well.  The estimated blood loss was 650  cc.  The patient was given back 300 cc of Cell Saver blood.  Sponge and needle counts were correct.  Following surgery, the patient was  turned back to the supine position, reversed from anesthetic, extubated, and  transferred to the recovery room for further care.  Hewitt Shorts, M.D.    RWN/MEDQ  D:  02/18/2002  T:  02/18/2002  Job:  [id number]

## 2010-06-25 NOTE — Cardiovascular Report (Signed)
[place]. [facility]  Patient:    [name], [name] Visit Number: [id number] MRN: [id number]          Service Type: CAT Location: [facility] 2853 01 Attending Physician:  Ruta Hinds Proc. Date: 11/07/00 Admit Date:  11/07/2000   CC:         Cath Lab  Dr. Jodelle Green c/o [facility]  Runell Gess, M.D.   Cardiac Catheterization  PROCEDURE:  Retrograde central aortic catheterization, selective coronary angiography via Judkins technique, LV angiogram in RAO and LAO projection, subselective LIMA/RIMA, abdominal aortic angiogram midstream PA projection.  DESCRIPTION OF PROCEDURE:  The patient was brought to the second floor CP lab in the postabsorptive state after 5 mg of Valium p.o. premedication.  This is a same-day admission with normal preoperative laboratory.  He was hydrated preoperatively.  Omnipaque dye was used throughout the procedure.  Diagnostic coronary angiography was done with 6 French 4 cm taper Cordis preformed coronary and pigtail catheters through a 6 [place] short Daig sidearm sheath that was placed in the CFRA via single anterior puncture using an 18 thin wall needle using modified Seldinger technique.  Subselective LIMA and RIMA were done with the right coronary catheter.  LV angiogram was done in the RAO and LAO projection with 25 cc at 14 cc per second, 20 cc at 12 cc per second. Pullback pressure of the CA showed no gradient across the aortic valve. Abdominal aortic angiogram in the midstream PA projection above the level of the renal arteries showed a patent siliac and SMA axis.  The renal arteries were large, single, and normal bilaterally.  The infrarenal abdominal aorta had no significant stenosis.  Mild tortuosity of the proximal iliacs which were widely patent with good runoff.  The LIMA and RIMA were widely patent. There was no brachiocephalic or subclavian stenosis.  There was tortuosity of the brachiocephalic  and subclavian vessels.  PRESSURES:  LV 120/0, LVEDP 16 mmHg.  CA 120/84 mmHg.  There was no gradient across the aortic valve on catheter pullback.  FLUOROSCOPY:  1+ left coronary and 1+ right coronary calcification.  There was no intracardiac or valvular calcification.  The left main coronary artery was normal.  The left anterior descending artery had a 40% smooth, eccentric narrowing at the junction of the proximal third after the large septal perforator and after the large DX1 branch.  The remainder of the LAD was widely patent, coursed to the apex and undersurface of the heart.  It had no significant narrowing or stenosis.  The first diagonal branch had 40% smooth narrowing in its midportion and 40% smooth narrowing in the distal third before the bifurcation.  This branch arose before SP1 and was very proximal.  The circumflex artery gave off a normal OM1 and OM2 that was small.  There was a large OM3 distally that was normal and a large posterior descending branch from the distal circumflex.  The PABG branches were widely patent and provided excellent, well-established collaterals to the distal RCA, PDA, and PLA. There was no significant circumflex disease with only minor 20% narrowing that was smooth in the midportion.  The right coronary artery showed a 70% stenosis at the junction of the proximal third and then 99 and 100% tandem stenoses in the mid RCA just after the RV branch.  There was only faint filling of a little bit of RCA beyond this with no other antegrade filling.  This was unchanged from prior angiograms.  Following  the procedure on review of the angiograms, the right femoral angiogram was taken by hand in the oblique projection showing good entrance into the RCFA.  A 6 French Perclose single-stitch device was then used to close the arteriotomy successfully.  The patient was brought to the holding area for postoperative care in stable condition.  He received 4  mg of Versed in divided doses for sedation during the procedure.  This 60 year old married father of two and grandfather is a prior smoker.  He quit in 1994.  The patient suffered a DMI on September 13, 1992, and was treated with emergency PTCA with a reperfusion time of 3 hours 14 minutes.  On subsequent catheterizations in 1995 and 1998, he was found to have total occlusion of his RCA with good collaterals and no significant left coronary disease.  He underwent diagnostic catheterization on this occasion for several months of exertional dyspnea as well as substernal pressure and fullness on heavy lifting and moderate exertion that had been consistent.  Fortunately, diagnostic angiography shows no significant progression of disease with only minor left coronary disease and excellent well-established collaterals to the right coronary artery.  His LV angiogram demonstrates hypokinesis of the proximal third of the inferior wall with no other wall motion abnormalities and EF of approximately 55% with no MR.  He has good LV function.  I would recommend continued medical therapy along with prophylactic aspirin, statin therapy, antihypertensive therapy which he has been on longterm.  Etiology of his chest pain is not clear and may be upper GI in origin.  CATHETERIZATION DIAGNOSES: 1. Chest pain etiology not determined. 2. Remote diaphragmatic myocardial infarction September 13, 1992, treated with    emergency percutaneous transluminal coronary angioplasty, subsequent silent    occlusion with excellent left to right collaterals. 3. Hyperlipidemia. 4. Systemic hypertension. 5. History of headaches on amitriptyline. 6. Benign prostatic hypertrophy. 7. Splenectomy, traumatic, at age 21. 96. Remote cigarette abuse, quit in 1994. Attending Physician:  Ruta Hinds DD:  11/07/00 TD:  11/07/00  Job: [id number]

## 2010-06-25 NOTE — Discharge Summary (Signed)
[name], [name]                 ACCOUNT NO.:  [id number]   MEDICAL RECORD NO.:  [id number]          PATIENT TYPE:  OIB   LOCATION:  2908                         FACILITY:  [facility]   PHYSICIAN:  Kinnie Scales. Annalee Genta, M.D.DATE OF BIRTH:  1951/01/17   DATE OF ADMISSION:  11/05/2003  DATE OF DISCHARGE:  11/06/2003                                 DISCHARGE SUMMARY   ADMISSION DIAGNOSES:  1.  Severe obstructive sleep apnea.  2.  Nasal septal deviation with nasal airway obstruction.  3.  Coronary artery disease.  4.  Hypertension.   DISCHARGE DIAGNOSES:  1.  Severe obstructive sleep apnea.  2.  Nasal septal deviation with nasal airway obstruction.  3.  Coronary artery disease.  4.  Hypertension.   PROCEDURES:  1.  Nasal septoplasty.  2.  Bilateral inferior turbinate cautery, surgery performed on November 05, 2003.   DISPOSITION:  The patient discharged to home in stable condition in the  company of his wife.   DISCHARGE MEDICATIONS:  Preoperative medications:  1.  Altace 10 mg p.o. daily.  2.  Nexium 40 mg daily.  3.  Xanax 1 mg p.o. b.i.d. p.r.n.  4.  Celexa 10 mg p.o. daily.  5.  Pravachol 80 mg p.o. q.h.s.   The patient also takes discharge medications which include:  1.  Percocet 5/325 1-2 p.o. q.4-6h. p.r.n. pain.  2.  Levaquin 500 mg p.o. daily x10 days.   Pain management:  Above medications or Tylenol.   DISCHARGE INSTRUCTIONS:  1.  Activity:  Limited.  No lifting, straining or nose bleeding.  2.  Diet:  No restrictions.  3.  Wound care:  Saline nasal spray 4 puffs per nostril each hour while      awake.  4.  The patient will follow up in my office in one week for postoperative      care.   BRIEF HISTORY:  [name] is a 60 year old white male who is referred for  evaluation of nasal airway obstruction, severe obstructive sleep apnea.  The  patient had significant past medical history which included hypertension and  coronary artery disease.  Given the  patient's history and failure to respond  to CPAP secondary to nasal airway obstruction, I recommended that we  consider him for nasal septoplasty and turbinate reduction under general  anesthesia.  The risks, benefits and possible complications of the procedure  were discussed in detail, and the patient and his family understood and  conferred with our plan for surgery.  Prior to surgery, he was cleared by  his cardiologist, and surgery was scheduled at [facility] main OR.   HOSPITAL COURSE:  The patient was admitted to the ENT service under Dr.  Thurmon Fair care on November 05, 2003.  He was taken to the operating room  at [facility] main OR and underwent nasal septoplasty and inferior  turbinate reduction under general anesthesia without complication or  incident.  The patient was transferred from the operating room to recovery  and from recovery to unit 2900 for postoperative monitoring.  The patients' postoperative course was uneventful.  He had a mild amount of  bloody nasal discharge, no significant pain, he was tolerating a normal oral  diet without difficulty, normal bowel and bladder function, was ambulating  without difficulty.  The patient was monitored overnight with continuous  pulse oximetry and cardiac monitoring, and his vital signs were stable  throughout the night.  He was discharged to home in stable condition with  the above discharge instructions in the company of his wife.  They  understand and concur our discharge planning.  Will follow up in my office  in one week for postoperative care.       DLS/MEDQ  D:  16/11/9602  T:  11/06/2003  Job:  [id number]   cc:   Madelin Rear. Sherwood Gambler, M.D.  [address]  [place]  [place] [address]  Fax: [phone]   Richard A. Alanda Amass, M.D.  [phone] [address]  [place]  [place] [address]  Fax: [phone]

## 2010-06-25 NOTE — Procedures (Signed)
[name], PANAS                 ACCOUNT NO.:  [id number]   MEDICAL RECORD NO.:  [id number]          PATIENT TYPE:  OUT   LOCATION:  SLEEP CENTER                 FACILITY:  [facility]   PHYSICIAN:  Clinton D. Maple Hudson, M.D. DATE OF BIRTH:  January 31, 1951   DATE OF STUDY:                              NOCTURNAL POLYSOMNOGRAM   REFERRING PHYSICIAN:  Kinnie Scales. Annalee Genta, M.D.   INDICATION FOR STUDY AND HISTORY:  1.  Hypersomnia with sleep apnea.  2.  Previous diagnosis of sleep apnea with CPAP.   Epworth sleepiness score 8/24, neck size 15.5 inches, BMI 29.3, weight 198  pounds.   SLEEP ARCHITECTURE:  Total sleep time 335 minutes with sleep efficiency of  86%.  Stage I was 6%; stage II, 74%; Stages III and IV 2%.  REM was 19% of  total sleep time.  Latency to sleep onset 6 minutes, latency to REM 140  minutes.  Awake after sleep onset 49 minute.  Arousal index 50.9 which is  increased.   RESPIRATORY DATA:  Split study protocol.  RDI 54.9 per hour indicating  severe obstructive sleep apnea/hypopnea syndrome before CPAP.  This included  1 obstructive apneas, 4 mixed apneas, and 125 hypopneas before CPAP.  Events  were not positional.  REM RDI was 23 per hour.  CPA was titrated to 13 CWP,  RDI 4.4 per hour.  The patient initially did not tolerate CPAP or BiPAP but  was switched back to CPAP at 9 CWP and subsequently titrated up with  satisfactory control and toleration.  A large Respironics mask was used with  heated humidifier.   OXYGEN DATA:  Very loud snoring with oxygen desaturation to a nadir of 85%  with apneas during REM before CPAP.  After CPAP titration, saturation held  95 to 98% on room air.   CARDIAC DATA:  Sinus rhythm with frequent PVCs.   MOVEMENT AND PARASOMNIA:  A total of 171 limb jerks were recorded of which  114 were associated with arousal or awakening for a periodic limb movement  with arousal index of 20.4 per hour which is markedly increased.   IMPRESSION AND  RECOMMENDATIONS:  1.  Severe obstructive sleep apnea/hypopnea syndrome, RDI 54.9 per hour with      desaturation to 85%.  2.  Successful CPAP titration to 13 CWP using a large Respironics mask with      heated humidifier.  3.  Frequent PVCs.  4.  Periodic limb movement with arousal, 20 per hour, which can be      reassessed after CPAP control if necessary.   Clinton D. Maple Hudson, M.D.  Diplomate, Biomedical engineer of Sleep Medicine                                                           Clinton D. Maple Hudson, M.D.  Diplomate, [employer] Board   CDY/MEDQ  D:  12/21/2003 09:23:12  T:  12/21/2003 10:36:55  Job:  [id number]

## 2010-06-25 NOTE — Discharge Summary (Signed)
NAMEERYCK, NEGRON                 ACCOUNT NO.:  [id number]   MEDICAL RECORD NO.:  [id number]          PATIENT TYPE:  INP   LOCATION:  3738                         FACILITY:  [facility]   PHYSICIAN:  Richard A. Alanda Amass, M.D.DATE OF BIRTH:  1950/12/24   DATE OF ADMISSION:  12/24/2007  DATE OF DISCHARGE:  12/26/2007                               DISCHARGE SUMMARY   DISCHARGE DIAGNOSES:  1. Unstable angina, negative myocardial infarction.  2. Coronary artery disease with previous percutaneous transluminal      coronary angioplasties and totally occluded right coronary artery.      a.     Stable coronary disease would continue to 100% right       coronary artery stenosis and left-to-right collaterals, and normal       ejection fraction.  3. Dyslipidemia with statin intolerance.  4. Hypertension, treated.  5. Remote tobacco use.   DISCHARGE CONDITION:  Improved.   PROCEDURES:  Combined left heart cath, December 25, 2007, by Dr. Nicki Guadalajara.   DISCHARGE MEDICATIONS:  1. Enteric-coated aspirin 81 mg daily.  2. Celexa 10 mg daily.  3. Multivitamin one daily.  4. Clonidine 0.1 mg twice a day.  5. Fish oil capsule daily.  6. Altace 20, decreased dose to 10 mg daily.  7. Increase Toprol-XL to 75 mg daily which would be 1-1/2 tabs of 50      mg tablets.  8. Added Norvasc 5 mg daily.  9. Pepcid 20 mg daily.  10.Fiorinal one every 4 hours as needed for pain.  11.__________ cream as needed.   DISCHARGE INSTRUCTIONS:  1. Return to work December 31, 2007.  2. Low-sodium heart-healthy diet.  3. Increase activity slowly, may shower.  No lifting for 2 days, no      driving for 2 days.  4. Wash cath site with soap and water.  Call if any bleeding,      swelling, or drainage.  5. Follow up with Dr. Alanda Amass in [place] in 2-3 weeks.  Office      will call with date and time.   HISTORY OF PRESENT ILLNESS:  A 60 year old male with a history of  coronary disease with remote inferior MI,  treated with angioplasty in  1994.  Restudy in 2003 showed total occluded distal RCA with left-to-  right collaterals.  He had moderate-to-mild disease of the LAD with good  LV function.  EF by Myoview in June 2007 was 54%.  When he was at work  on the day of admission, December 24, 2007, developed substernal chest  tightness with radiation to his jaw, got nauseated and mildly flushed,  went to the bathroom, took a nitroglycerin with partial relief.  He was  seen by the office nurse and was sent to the emergency room at [facility].  In  the ER, he was pain free.  He denied any recent increase in his usual  angina, though this episode was a little bit more dramatic for him.   PAST MEDICAL HISTORY:  Other than coronary disease, he has hypertension,  hyperlipidemia, and STATIN intolerance.  ALLERGIES:  PENICILLIN, STATINS, and NIACIN.   FAMILY HISTORY, SOCIAL HISTORY, REVIEW OF SYSTEMS:  See H&P.   PHYSICAL EXAMINATION:  VITAL SIGNS:  Blood pressure 145/85, pulse in the  70s sinus rhythm, oxygen saturation 96-99%.  NECK:  Supple.  No JVD.  LUNGS:  Clear without rales.  HEART:  Regular rate and rhythm.  Right groin cath site was stable.   LABORATORY DATA:  Hemoglobin 14.3, hematocrit 42.1, WBC 7.5, platelets  338.  Neutrophil 66, lymph 20, mono 11, eos 3, bas is 0 .  Protime 13.3,  INR of 1, on heparin drip he was therapeutic.   Chemistry, sodium 139, potassium 4, chloride 105, CO2 27, glucose 81,  BUN 12, creatinine 1.08.   Total protein 6.8, albumin 3.9, AST 24, ALT 22, alkaline phos 72, total  bili 0.5, magnesium 2.2.   Cardiac enzymes were negative.  CK range 149 to 114, MB 2.4 to 1.8, and  troponin I 0.01.   Total cholesterol 190, triglycerides 170, HDL 26, LDL 130.  TSH 1.384.  UA was clear.  EKG on admission, sinus rhythm, old inferior infarction.  No acute changes.   HOSPITAL COURSE:  The patient was admitted after presenting from work  after episode of somewhat more  traumatic chest pain.  Pain free on  arrival to the emergency room.  He was admitted to evaluate on December 24, 2007,  by Dr. Allyson Sabal.  Placed on IV heparin, nitroglycerin drip was  held.  Serial CK and MBs were done which were negative.  It was planned  for cardiac catheterization which he underwent by Dr. Tresa Endo on December 25, 2007, which was stable and by December 26, 2007, he was still  slightly hypertensive his medications were adjusted by Dr. Tresa Endo with  the addition of Norvasc and increasing the Lopressor and decreasing the  ACE inhibitor and after ambulating the hall, he was stable and was  discharged home.  He would follow up as an outpatient.      [name]. [place], N.P.      Richard A. Alanda Amass, M.D.  Electronically Signed   LRI/MEDQ  D:  02/06/2008  T:  02/07/2008  Job:  [id number]   cc:   Madelin Rear. Sherwood Gambler, MD  Richard A. Alanda Amass, M.D.

## 2010-06-25 NOTE — Discharge Summary (Signed)
[name], LEVELS                           ACCOUNT NO.:  [id number]   MEDICAL RECORD NO.:  [id number]                   PATIENT TYPE:  INP   LOCATION:  3006                                 FACILITY:  [facility]   PHYSICIAN:  Hewitt Shorts, M.D.            DATE OF BIRTH:  01-Nov-1950   DATE OF ADMISSION:  02/18/2002  DATE OF DISCHARGE:  02/21/2002                                 DISCHARGE SUMMARY   ADMISSION HISTORY AND PHYSICAL:  The patient is a 60 year old man who was  evaluated for back and left lower extremity pain.  He was found to have  large L4-5 disk herniation, worse on the left than the right, with canal  stenosis, severe at the L4-5 level, again left worse than right.  Decision  was made to admit the patient for decompression and arthrodesis.  General  examination was unremarkable.  Neurologic examination was intact.  He did  undergo preoperative cardiology clearance by Dr. Alanda Amass.   HOSPITAL COURSE:  The patient was admitted, taken to surgery for a bilateral  L4-5 lumbar laminotomy, facetectomy, microdiskectomy, and posterior lumbar  interbody fusion with split femoral ring allograft and Vitoss with bone  marrow aspirate, posterior lateral arthrodesis with 90D posterior  instrumentation, Vitoss, and bone marrow aspirate.  He had mild to moderate  bradycardia after induction, intubation, and positioning in prone position.  We contacted Dr. Alanda Amass who felt that the bradycardia was probably due to  his beta blocker (Toprol XL).  He felt that we could proceed with surgery,  and the remainder of the surgical procedure was uneventful without evidence  of any further bradycardia.   He was monitored with telemetry postoperatively and was seen in  postoperative cardiology evaluation by Dr. Jenne Campus.  His telemetry did not  reveal any significant abnormalities and was discontinued.  He has been able  to get up ambulating in the halls.  His wound is healing well.  He has  had  some difficulties with constipation, and that is continuing to be worked on.  He has had some mild postoperative fever that improved with incentive  spirometry.  He is encouraged to steadily increase his ambulation and  activity.   FOLLOW UP:  We would like to see him back in three weeks for followup.  He  is to have AP and lateral lumbar spine x-rays done at the time of that  visit.   DISCHARGE MEDICATIONS:  Percocet 1 to 2 tablets p.o. q.4-6h. p.r.n. pain, 60  tablets prescribed with no refills.   WOUND CARE AND ACTIVITIES:  He was given instructions regarding wound care  and activities.   DISCHARGE DIAGNOSES:  1. Lumbar disk herniation.  2. Lumbar stenosis.  3. Lumbar spondylosis.  4. Lumbar degenerative disk disease.  Hewitt Shorts, M.D.   RWN/MEDQ  D:  02/21/2002  T:  02/21/2002  Job:  [id number]

## 2010-06-25 NOTE — Discharge Summary (Signed)
[place]. [facility]  Patient:    CELEDONIO, SORTINO Visit Number: [id number] MRN: [id number]          Service Type: MED Location: 2000 2033 01 Attending Physician:  Virgina Evener Dictated by:   Abelino Derrick, P.A.C. Admit Date:  04/01/2001 Discharge Date: 04/03/2001   CC:         Dr. Luciana Axe in [facility] COURSE:  Mr. Erbes is a 60 year old male followed by Dr. Alanda Amass in [place] with a history of coronary disease.  He had a DMI in 1994.  He was catheterized in October 2002 and had a total RCA with left-to-right collaterals from the circumflex.  He did have a 40% eccentric LAD narrowing. His EF was 55%.  He has been doing well on medical therapy.  He presented to [facility] ER over the weekend with chest pain and was transferred to [facility]. He was put on heparin and nitrates and his beta blocker was increased and he ruled out for an MI.  He was set up for a diagnostic catheterization on April 02, 2001 which was done by Dr. Alanda Amass.  This revealed essentially no change in his anatomy.  Plan is for continued medical therapy and empiric GI therapy.  He has had some myalgias in the past on Zocor and again on Lipitor.  He was unable to tolerate Advicor because of flushing.  He will be sent home off Lipitor and follow up with Dr. Alanda Amass and see if any of his myalgias improve.  DISCHARGE MEDICATIONS: 1. Altace 2.5 mg a day. 2. Coated aspirin q.d. 3. Imdur 30 mg a day. 4. Norvasc 5 mg a day. 5. Toprol-XL 50 mg a day. 6. Nitroglycerin sublingual p.r.n. 7. Nexium 40 mg once a day. 8. Ambien p.r.n. sleep.  LABORATORY DATA:  CK-MB and troponins are negative.  Sodium 140, potassium 3.3, BUN 9, creatinine 0.9.  LFTs are normal.  INR 1.1.  Lipid panel was obtained and is pending.  EKG shows sinus rhythm, inferior T waves.  DISPOSITION:  Patient is discharged in stable condition and will follow up with Dr.  Alanda Amass in [place]. Dictated by:   Abelino Derrick, P.A.C. Attending Physician:  Virgina Evener DD:  04/03/01 TD:  04/03/01 Job: [id number]

## 2010-06-25 NOTE — Op Note (Signed)
NAME:  [name], [name]                           ACCOUNT NO.:  [id number]   MEDICAL RECORD NO.:  [id number]                   PATIENT TYPE:  AMB   LOCATION:  DAY                                  FACILITY:  [facility]   PHYSICIAN:  R. Roetta Sessions, M.D.              DATE OF BIRTH:  [birth year]   DATE OF PROCEDURE:  05/13/2002  DATE OF DISCHARGE:                                 OPERATIVE REPORT   PROCEDURE:  Esophagogastroduodenoscopy with gastric and small bowel biopsy.   INDICATIONS FOR PROCEDURE:  The patient is a 60 year old gentleman with a  one-year history of worsening left pectoral/scapular pain, postprandial  abdominal bloating, and weight loss.  This gentleman has been evaluated  previously by Dr. Christella Hartigan, including EGD and colonoscopy.  I saw him  originally the first of March.  HIDA scan demonstrated no tracer activity in  the small bowel; however, he had a gallbladder EF in the 80% range.  His  bile duct was not dilated on ultrasound nor did he have any stones.  His  LFTs and amylase have been normal.  Recently performed an MR arteriogram at  [facility] to check for mesenteric ischemia.  This was also  essentially negative.  He has not responded to Nexium.   I spoke with his wife who tells me that he has been quite anxious and not  being able to do his usual activities such as working in the yard or working  on his antique cars because of his back.  He has been wearing a tight-  fitting back corset which just over the past one week he has loosened up and  started to wean himself off of, which has been associated with some  improvement in the above-mentioned symptoms.  He did have serological  evidence of H pylori and just recently completed triple drug therapy.  EGD  is now being done to further evaluate his symptoms.  I spoke with Dr.  Regino Schultze about this patient last week.  This approach has been discussed with  the patient.  The potential risks, benefits, and  alternatives have been  reviewed and questions answered.  Please see my handwritten H&P in the  office notes on the chart.   PROCEDURE:  O2 saturation, blood pressure, pulses, and respirations were  monitored throughout the entire procedure.  Conscious sedation was with  Versed 5 mg IV, Demerol 100 mg IV in divided doses.  The instrument used was  the Olympus video chip gastroscope.   FINDINGS:  Examination of the tubular esophagus revealed no mucosal  abnormalities.  The EG junction was easily traversed.   Stomach:  The gastric cavity was empty and insufflated well with air.  Thorough examination of the gastric mucosa including retroflex view in the  proximal stomach and esophagogastric junction demonstrated some mottled  antral erythema.  There was no ulcer, erosion, or infiltrating process.  There was  a small hiatal hernia.  The pylorus was patent and easily  traversed.   Duodenum:  Examination of D1, D2, and D3 revealed no abnormalities.   THERAPEUTIC/DIAGNOSTIC MANEUVERS PERFORMED:  Biopsies of D2 and D3 were  taken for histologic study.  Also, biopsies of the antrum were taken for  histologic study.   The patient tolerated the procedure well and was reactive in endoscopy.   IMPRESSION:  1. Normal esophagus.  2. Mottled antral erythema, doubt clinical significance.  3. Small hiatal hernia.  Otherwise normal stomach.  4. Normal first, second, and third portions of the duodenum.  Biopsies     taken, as outlined above.   RECOMMENDATIONS:  1. Will also check repeat labs today, CBC with differential, LFTs, amylase,     sed rate.  Will go ahead and check a celiac antibody panel.  2. Continue Nexium and other medications at this time without change.   NOTE:  If not mentioned above, the patient told me that he saw Dr. Alanda Amass  and associates recently, and another stress test is planned in the very near  future (the patient states he had one about a year ago and things were   okay).  He does have a history of coronary disease.   I certainly agree with the approach of another stress test at this point in  time, although I doubt his symptoms are cardiac in origin.  It is very  difficult for me to unify all of his symptoms into one diagnosis at this  time.  Further recommendations to follow.                                               Jonathon Bellows, M.D.    RMR/MEDQ  D:  05/13/2002  T:  05/13/2002  Job:  [id number]

## 2010-06-25 NOTE — H&P (Signed)
[name], [name]                           ACCOUNT NO.:  [id number]   MEDICAL RECORD NO.:  [id number]                   PATIENT TYPE:  INP   LOCATION:  3006                                 FACILITY:  [facility]   PHYSICIAN:  Hewitt Shorts, M.D.            DATE OF BIRTH:  [birth year]   DATE OF ADMISSION:  02/18/2002  DATE OF DISCHARGE:                                HISTORY & PHYSICAL   HISTORY OF PRESENT ILLNESS:  The patient is a 60 year old right-handed white  male who was evaluated for low back and left lower extremity pain.  He had  been having difficulty since September 2003.  He has pain from the left side  of his low back into the left buttock, posterior thigh and leg, and into the  sole of his left foot.  There is numbness and tingling in the left buttock,  posterior thigh and leg, and into the lateral aspect of the left foot and  into the sole of the left foot.  He did not describe any weakness.   MRI of the lumbar spine and x-rays of the lumbar spine were done.  X-ray  showed good disc space height and preservation. The alignment is good but  there is osteophytic spurring anteriorly at each of the lower lumbar disc  levels.  As well, there is some right L5-S1 facet arthropathy.  MRI scan  reveals mild stenosis of L2-3, L3-4 and in L4-5 there is a large disc  herniation, broad based, worse to the left than to the right superimposed  upon preexisting mild canal stenosis resulting in severe stenosis at L4-5,  left worse than right.  There is some right L5-S1 facet arthropathy.   The patient is admitted now for decompression and arthrodesis at the  L4-5  level.   PAST MEDICAL HISTORY:  This is notable for history of myocardial infarction  in August 1994, history of hypertension, hiatal hernia, and pilonidal cyst.  The patient underwent cardiac evaluation and was cleared for surgery by Dr.  Alanda Amass.  He did not describe any history of stroke, diabetes, cancer or  lung  disease.   ALLERGIES:  PENICILLIN.   CURRENT MEDICATIONS:  1. Enteric-coated aspirin 81 mg daily.  2. Imdur 30 mg daily.  3. Celexa 10 mg daily.  4. Toprol XL 25 mg daily.  5. Xanax 1 mg q. h.s.  6. NitroQuick 0.4 mg p.r.n. chest pain.  7. Fiorinal one q.4 h. p.r.n. for headaches.   PAST SURGICAL HISTORY:  This includes a splenectomy and partial nephrectomy  in the 1960s for an injury that he sustained as a child.  He had a vasectomy  in 1978, and pilonidal cyst surgery in the mid 1970s and lithotripsy on  January 15, 2002.   FAMILY HISTORY:  His father died of a heart attack and had asthma as well.  His mother is in fair health at age  67.  She has leukopenia and stomach  disease.   SOCIAL HISTORY:  The patient works as a Press photographer at [employer].  He  stopped smoking in 1994.  He smoked previously from age 52.  He does not  drink alcoholic beverages and denies history of substance abuse.   REVIEW OF SYSTEMS:  This is notable as described in history of present  illness and past medical history but is, otherwise, unremarkable.   PHYSICAL EXAMINATION:  GENERAL:  A patient is a well-developed, well-  nourished black male in no acute distress.  VITAL SIGNS:  Height 5 feet 9 inches.  Weight 190 pounds.  He is afebrile.  LUNGS:  Clear to auscultation.  He has symmetrical respiratory excursion.  CARDIOVASCULAR:  Regular rate and rhythm, S1 and S2.  There is no murmur.  ABDOMEN:  Soft and nondistended.  Bowel sounds present.  EXTREMITIES:  There is no clubbing, cyanosis, or edema.  MUSCULOSKELETAL:  No tenderness to palpation over the cervical spinous  process or paracervical musculature.  There is no tenderness over the lumbar  spinous process or paralumbar musculature.  He has good range of motion of  the neck without significant discomfort on range of motion of the neck.  He  is able to flex to nearly 90 degrees in the low back.  He has discomfort  with extension.  Straight  leg raising is negative bilaterally.  NEUROLOGICAL:  There is 5/5 strength in the upper and lower extremities  including deltoid, biceps, triceps, intrinsics, grip, iliopsoas, quadriceps,  dorsiflexion, plantar flexion, extensor hallucis longus bilaterally.  Sensation is intact to pinprick in both the upper and lower extremities.  Reflexes are trace in the biceps and brachioradialis bilaterally, 1-2 in the  triceps and quadriceps bilaterally, trace in the gastrocnemius bilaterally.  Toes are downgoing bilaterally.  He has a normal gait and stance.   IMPRESSION:  Low back and left lower extremity pain secondary to a large L4-  5 lumbar disc herniation with resultant canal stenosis, severe at the L4-5  level with more mild degenerative changes seen in other levels of the lumbar  region.   PLAN:  The patient will be admitted for bilateral L4-5 lumbar laminotomy,  facetectomy, microdiscectomy and posterior lumbar inner body fusion with  bone graft and bilateral L4-5 posterior lateral arthrodesis with posterior  instrumentation and bone graft.  We discussed the nature of surgery,  difficulty of surgery, overall recuperation,  his limitations  postoperatively, the need for postoperative mobilization and a lumbar corset  and the plan to use a CellSaver during the surgery.  We also discussed the  risks of surgery including risk for infection, bleeding, possible need for  transfusion, the risk of nerve dysfunction with pain, lesions, numbness or  paresthesias, the risk of dural tears and CSF leakage, possible need for  another surgery, anesthetic risks and myocardial infarction, stroke,  pneumonia and death.  Understanding all of this, he wishes to go ahead with  surgery and is admitted for such.                                               Hewitt Shorts, M.D.    RWN/MEDQ  D:  02/20/2002  T:  02/20/2002  Job:  [id number]

## 2010-10-13 ENCOUNTER — Other Ambulatory Visit (HOSPITAL_COMMUNITY): Payer: Self-pay | Admitting: Family Medicine

## 2010-10-13 ENCOUNTER — Ambulatory Visit (HOSPITAL_COMMUNITY)
Admission: RE | Admit: 2010-10-13 | Discharge: 2010-10-13 | Disposition: A | Discharge status: Home / Self Care | Payer: 59 | Source: Ambulatory Visit | Attending: Family Medicine | Admitting: Family Medicine

## 2010-10-13 DIAGNOSIS — M25579 Pain in unspecified ankle and joints of unspecified foot: Secondary | ICD-10-CM

## 2010-11-09 LAB — LIPID PANEL
Cholesterol: 190
HDL: 26 — ABNORMAL LOW
LDL Cholesterol: 130 — ABNORMAL HIGH
Total CHOL/HDL Ratio: 7.3
Triglycerides: 170 — ABNORMAL HIGH
VLDL: 34

## 2010-11-09 LAB — COMPREHENSIVE METABOLIC PANEL
ALT: 20
ALT: 22
AST: 24
AST: 27
Albumin: 3.9
Albumin: 4.1
Alkaline Phosphatase: 72
Alkaline Phosphatase: 73
BUN: 12
BUN: 12
CO2: 27
CO2: 27
Calcium: 9.1
Calcium: 9.1
Chloride: 103
Chloride: 104
Creatinine, Ser: 1.08
Creatinine, Ser: 1.12
GFR calc Af Amer: >60
GFR calc Af Amer: >60
GFR calc non Af Amer: >60
GFR calc non Af Amer: >60
Glucose, Bld: 81
Glucose, Bld: 93
Potassium: 3.4 — ABNORMAL LOW
Potassium: 3.8
Sodium: 139
Sodium: 139
Total Bilirubin: 0.5
Total Bilirubin: 0.7
Total Protein: 6.8
Total Protein: 7.4

## 2010-11-09 LAB — URINALYSIS, ROUTINE W REFLEX MICROSCOPIC
Bilirubin Urine: NEGATIVE
Bilirubin Urine: NEGATIVE
Glucose, UA: NEGATIVE
Glucose, UA: NEGATIVE
Hgb urine dipstick: NEGATIVE
Hgb urine dipstick: NEGATIVE
Ketones, ur: NEGATIVE
Ketones, ur: NEGATIVE
Nitrite: NEGATIVE
Nitrite: NEGATIVE
Protein, ur: NEGATIVE
Protein, ur: NEGATIVE
Specific Gravity, Urine: 1.014
Specific Gravity, Urine: 1.014
Urobilinogen, UA: 0.2
Urobilinogen, UA: 0.2
pH: 7
pH: 7.5

## 2010-11-09 LAB — CBC
HCT: 42.1
HCT: 43.4
Hemoglobin: 14.3
Hemoglobin: 14.7
MCHC: 34
MCHC: 34
MCV: 92.8
MCV: 92.9
Platelets: 388
Platelets: 392
RBC: 4.54
RBC: 4.67
RDW: 13
RDW: 13.2
WBC: 7.5
WBC: 7.5

## 2010-11-09 LAB — CARDIAC PANEL(CRET KIN+CKTOT+MB+TROPI)
CK, MB: 1.8
CK, MB: 1.8
Relative Index: 1.3
Relative Index: 1.6
Total CK: 114
Total CK: 139
Troponin I: 0.01
Troponin I: 0.01

## 2010-11-09 LAB — POCT I-STAT, CHEM 8
BUN: 14
Calcium, Ion: 1.19
Chloride: 105
Creatinine, Ser: 1.2
Glucose, Bld: 95
HCT: 43
Hemoglobin: 14.6
Potassium: 4
Sodium: 139
TCO2: 27

## 2010-11-09 LAB — DIFFERENTIAL
Basophils Absolute: 0
Basophils Relative: 0
Eosinophils Absolute: 0.2
Eosinophils Relative: 3
Lymphocytes Relative: 20
Lymphs Abs: 1.5
Monocytes Absolute: 0.9
Monocytes Relative: 11
Neutro Abs: 5
Neutrophils Relative %: 66

## 2010-11-09 LAB — POCT CARDIAC MARKERS
CKMB, poc: 2.2
Myoglobin, poc: 110
Troponin i, poc: <0.05

## 2010-11-09 LAB — CK TOTAL AND CKMB (NOT AT ARMC)
CK, MB: 2.4
Relative Index: 1.6
Total CK: 149

## 2010-11-09 LAB — TSH: TSH: 1.384

## 2010-11-09 LAB — PROTIME-INR
INR: 1
Prothrombin Time: 13.3

## 2010-11-09 LAB — HEPARIN LEVEL (UNFRACTIONATED)
Heparin Unfractionated: 0.13 — ABNORMAL LOW
Heparin Unfractionated: 0.52
Heparin Unfractionated: 0.65

## 2010-11-09 LAB — TROPONIN I: Troponin I: <0.01

## 2010-11-09 LAB — MAGNESIUM: Magnesium: 2.2

## 2011-03-30 ENCOUNTER — Other Ambulatory Visit (HOSPITAL_COMMUNITY): Payer: Self-pay | Admitting: Urology

## 2011-03-30 DIAGNOSIS — N400 Enlarged prostate without lower urinary tract symptoms: Secondary | ICD-10-CM

## 2011-04-01 ENCOUNTER — Ambulatory Visit (HOSPITAL_COMMUNITY)
Admission: RE | Admit: 2011-04-01 | Discharge: 2011-04-01 | Disposition: A | Discharge status: Home / Self Care | Payer: 59 | Source: Ambulatory Visit | Attending: Urology | Admitting: Urology

## 2011-04-01 DIAGNOSIS — N200 Calculus of kidney: Secondary | ICD-10-CM | POA: Insufficient documentation

## 2011-04-01 DIAGNOSIS — R350 Frequency of micturition: Secondary | ICD-10-CM | POA: Insufficient documentation

## 2011-04-01 DIAGNOSIS — Q619 Cystic kidney disease, unspecified: Secondary | ICD-10-CM | POA: Insufficient documentation

## 2011-04-01 DIAGNOSIS — N400 Enlarged prostate without lower urinary tract symptoms: Secondary | ICD-10-CM | POA: Insufficient documentation

## 2011-04-01 MED ORDER — IOHEXOL 300 MG/ML  SOLN
125.0000 mL | Freq: Once | INTRAMUSCULAR | Status: AC | PRN
  Administered 2011-04-01: 125 mL via INTRAVENOUS

## 2012-02-06 ENCOUNTER — Encounter (HOSPITAL_COMMUNITY): Payer: Self-pay | Admitting: *Deleted

## 2012-02-06 ENCOUNTER — Emergency Department (HOSPITAL_COMMUNITY): Payer: 59

## 2012-02-06 ENCOUNTER — Emergency Department (HOSPITAL_COMMUNITY)
Admission: EM | Admit: 2012-02-06 | Discharge: 2012-02-06 | Disposition: A | Discharge status: Home / Self Care | Payer: 59 | Attending: Emergency Medicine | Admitting: Emergency Medicine

## 2012-02-06 DIAGNOSIS — Z87448 Personal history of other diseases of urinary system: Secondary | ICD-10-CM | POA: Insufficient documentation

## 2012-02-06 DIAGNOSIS — M25559 Pain in unspecified hip: Secondary | ICD-10-CM | POA: Insufficient documentation

## 2012-02-06 DIAGNOSIS — L0231 Cutaneous abscess of buttock: Secondary | ICD-10-CM | POA: Insufficient documentation

## 2012-02-06 DIAGNOSIS — L0291 Cutaneous abscess, unspecified: Secondary | ICD-10-CM

## 2012-02-06 DIAGNOSIS — Z8679 Personal history of other diseases of the circulatory system: Secondary | ICD-10-CM | POA: Insufficient documentation

## 2012-02-06 DIAGNOSIS — Z7982 Long term (current) use of aspirin: Secondary | ICD-10-CM | POA: Insufficient documentation

## 2012-02-06 DIAGNOSIS — F329 Major depressive disorder, single episode, unspecified: Secondary | ICD-10-CM | POA: Insufficient documentation

## 2012-02-06 DIAGNOSIS — Z87891 Personal history of nicotine dependence: Secondary | ICD-10-CM | POA: Insufficient documentation

## 2012-02-06 DIAGNOSIS — Z9889 Other specified postprocedural states: Secondary | ICD-10-CM | POA: Insufficient documentation

## 2012-02-06 DIAGNOSIS — Z79899 Other long term (current) drug therapy: Secondary | ICD-10-CM | POA: Insufficient documentation

## 2012-02-06 DIAGNOSIS — F3289 Other specified depressive episodes: Secondary | ICD-10-CM | POA: Insufficient documentation

## 2012-02-06 HISTORY — DX: Major depressive disorder, single episode, unspecified: F32.9

## 2012-02-06 HISTORY — DX: Depression, unspecified: F32.A

## 2012-02-06 MED ORDER — HYDROCODONE-ACETAMINOPHEN 5-325 MG PO TABS
2.0000 | ORAL_TABLET | Freq: Once | ORAL | Status: AC
  Administered 2012-02-06: 2 via ORAL
  Filled 2012-02-06: qty 2

## 2012-02-06 MED ORDER — DOXYCYCLINE HYCLATE 100 MG PO TABS
100.0000 mg | ORAL_TABLET | Freq: Once | ORAL | Status: AC
  Administered 2012-02-06: 100 mg via ORAL
  Filled 2012-02-06: qty 1

## 2012-02-06 MED ORDER — IBUPROFEN 800 MG PO TABS
800.0000 mg | ORAL_TABLET | Freq: Once | ORAL | Status: AC
  Administered 2012-02-06: 800 mg via ORAL
  Filled 2012-02-06: qty 1

## 2012-02-06 MED ORDER — DOXYCYCLINE HYCLATE 100 MG PO CAPS: 100.0000 mg | ORAL_CAPSULE | Freq: Two times a day (BID) | ORAL | Status: DC

## 2012-02-06 MED ORDER — HYDROCODONE-ACETAMINOPHEN 5-325 MG PO TABS: 1.0000 | ORAL_TABLET | ORAL | Status: AC | PRN

## 2012-02-06 NOTE — ED Provider Notes (Signed)
History     CSN: [id number]  Arrival date & time 02/06/12  0145   First MD Initiated Contact with Patient 02/06/12 [phone]      Chief Complaint  Patient presents with  . Cyst    (Consider location/radiation/quality/duration/timing/severity/associated sxs/prior treatment) HPI [name] is a 61 y.o. male who presents to the Emergency Department complaining of right hip pain that began tonight making it hard for him to lie down. No known injury. He is also here because he has an abscess on his right buttock. This is the 4th abscess in 4 months. It has begun to drain. Denies fever, chills, nausea, vomiting.   PCP Dr. Sherwood Gambler Past Medical History  Diagnosis Date  . Renal disorder   . Heart attack   . Depression     Past Surgical History  Procedure Date  . Knee surgery   . Back surgery   . Wrist surgery   . Cystectomy   . Nose surgery     No family history on file.  History  Substance Use Topics  . Smoking status: Former Games developer  . Smokeless tobacco: Not on file  . Alcohol Use: No      Review of Systems  Constitutional: Negative for fever.       10 Systems reviewed and are negative for acute change except as noted in the HPI.  HENT: Negative for congestion.   Eyes: Negative for discharge and redness.  Respiratory: Negative for cough and shortness of breath.   Cardiovascular: Negative for chest pain.  Gastrointestinal: Negative for vomiting and abdominal pain.  Musculoskeletal: Negative for back pain.       Right hip pain  Skin: Negative for rash.       abscess  Neurological: Negative for syncope, numbness and headaches.  Psychiatric/Behavioral:       No behavior change.    Allergies  Penicillins  Home Medications   Current Outpatient Rx  Name  Route  Sig  Dispense  Refill  . AMLODIPINE BESYLATE 2.5 MG PO TABS   Oral   Take 2.5 mg by mouth daily.         . ASPIRIN 81 MG PO TABS   Oral   Take 81 mg by mouth 2 (two) times daily.         Marland Kitchen  CITALOPRAM HYDROBROMIDE 10 MG PO TABS   Oral   Take 10 mg by mouth daily.         Marland Kitchen EZETIMIBE 10 MG PO TABS   Oral   Take 10 mg by mouth daily.         Marland Kitchen GEMFIBROZIL 600 MG PO TABS   Oral   Take 600 mg by mouth 2 (two) times daily.         Marland Kitchen METOPROLOL SUCCINATE ER 50 MG PO TB24   Oral   Take 50 mg by mouth daily. Take with or immediately following a meal.           BP 157/91  Pulse 97  Temp 97.9 F (36.6 C) (Oral)  Resp 20  Ht 5\' 9"  (1.753 m)  Wt 230 lb (104.327 kg)  BMI 33.96 kg/m2  SpO2 95%  Physical Exam  Nursing note and vitals reviewed. Constitutional: He appears well-developed and well-nourished.       Awake, alert, nontoxic appearance.  HENT:  Head: Atraumatic.  Eyes: Right eye exhibits no discharge. Left eye exhibits no discharge.  Neck: Neck supple.  Cardiovascular: Normal heart sounds.  Pulmonary/Chest: Effort normal and breath sounds normal. He exhibits no tenderness.  Abdominal: Soft. Bowel sounds are normal. There is no tenderness. There is no rebound.  Musculoskeletal: He exhibits no tenderness.       Baseline ROM, no obvious new focal weakness.Unable to perform ROM due to discomfort. No swelling, erythema, warmth to right hip.   Neurological:       Mental status and motor strength appears baseline for patient and situation.  Skin: No rash noted.       Abscess to right buttock draining purulent material. Beginning cyst in pilonidal area.  Psychiatric: He has a normal mood and affect.    ED Course  Procedures (including critical care time)  Dg Hip Complete Right  02/06/2012  *RADIOLOGY REPORT*  Clinical Data: Worsening cyst on buttocks; right hip pain.  RIGHT HIP - COMPLETE 2+ VIEW  Comparison: None.  Findings: There is no evidence of fracture or dislocation.  Both femoral heads are seated normally within their respective acetabula.  The proximal right femur appears intact.  The patient is status post lumbar spinal fusion at L4-L5.  The  sacroiliac joints are unremarkable in appearance.  The visualized bowel gas pattern is grossly unremarkable in appearance.  IMPRESSION: No evidence of fracture or dislocation.   Original Report Authenticated By: Tonia Ghent, M.D.    [phone] Patient states hip feels better and he is able to walk better.  MDM  Patient presents with sudden onset right hip pain and recurrent abscesses to buttock. Given ibuprofen and analgesic. xrays of hip without acute findings. Suspect hip pain is due to him sitting and walking abnormally due to the abscesses.Initiated antibiotic therapy. Referral to general surgeon. Reviewed results with patient and his wife.  Pt feels improved after observation and/or treatment in ED.Pt stable in ED with no significant deterioration in condition.The patient appears reasonably screened and/or stabilized for discharge and I doubt any other medical condition or other [facility] requiring further screening, evaluation, or treatment in the ED at this time prior to discharge.  MDM Reviewed: nursing note and vitals Interpretation: x-ray           Nicoletta Dress. Colon Branch, MD 02/06/12 [phone]

## 2012-02-06 NOTE — ED Notes (Signed)
Pt reports cyst on buttocks for the past 2 weeks just keeps getting worse. States this is the third or fourth cyst he has had over the past 4 months

## 2012-05-09 ENCOUNTER — Other Ambulatory Visit: Payer: Self-pay | Admitting: Neurosurgery

## 2012-05-09 DIAGNOSIS — M47817 Spondylosis without myelopathy or radiculopathy, lumbosacral region: Secondary | ICD-10-CM

## 2012-05-09 DIAGNOSIS — M5137 Other intervertebral disc degeneration, lumbosacral region: Secondary | ICD-10-CM

## 2012-05-10 ENCOUNTER — Other Ambulatory Visit (HOSPITAL_COMMUNITY): Payer: Self-pay | Admitting: Internal Medicine

## 2012-05-10 DIAGNOSIS — D34 Benign neoplasm of thyroid gland: Secondary | ICD-10-CM

## 2012-05-14 ENCOUNTER — Ambulatory Visit (HOSPITAL_COMMUNITY)
Admission: RE | Admit: 2012-05-14 | Discharge: 2012-05-14 | Disposition: A | Discharge status: Home / Self Care | Payer: 59 | Source: Ambulatory Visit | Attending: Internal Medicine | Admitting: Internal Medicine

## 2012-05-14 DIAGNOSIS — E041 Nontoxic single thyroid nodule: Secondary | ICD-10-CM | POA: Insufficient documentation

## 2012-05-14 DIAGNOSIS — D34 Benign neoplasm of thyroid gland: Secondary | ICD-10-CM

## 2012-05-22 ENCOUNTER — Encounter (HOSPITAL_COMMUNITY): Payer: Self-pay | Admitting: Pharmacy Technician

## 2012-05-22 ENCOUNTER — Encounter (HOSPITAL_COMMUNITY): Payer: Self-pay

## 2012-05-22 ENCOUNTER — Encounter (HOSPITAL_COMMUNITY)
Admission: RE | Admit: 2012-05-22 | Discharge: 2012-05-22 | Disposition: A | Discharge status: Home / Self Care | Payer: 59 | Source: Ambulatory Visit | Attending: General Surgery | Admitting: General Surgery

## 2012-05-22 HISTORY — DX: Essential (primary) hypertension: I10

## 2012-05-22 HISTORY — DX: Adverse effect of unspecified anesthetic, initial encounter: T41.45XA

## 2012-05-22 HISTORY — DX: Sleep apnea, unspecified: G47.30

## 2012-05-22 HISTORY — DX: Other complications of anesthesia, initial encounter: T88.59XA

## 2012-05-22 LAB — CBC WITH DIFFERENTIAL/PLATELET
Basophils Absolute: 0.1 10*3/uL (ref 0.0–0.1)
Basophils Relative: 1 % (ref 0–1)
Eosinophils Absolute: 0.3 10*3/uL (ref 0.0–0.7)
Eosinophils Relative: 4 % (ref 0–5)
HCT: 40.7 % (ref 39.0–52.0)
Hemoglobin: 14.2 g/dL (ref 13.0–17.0)
Lymphocytes Relative: 20 % (ref 12–46)
Lymphs Abs: 1.8 10*3/uL (ref 0.7–4.0)
MCH: 30.7 pg (ref 26.0–34.0)
MCHC: 34.9 g/dL (ref 30.0–36.0)
MCV: 87.9 fL (ref 78.0–100.0)
Monocytes Absolute: 1.8 10*3/uL — ABNORMAL HIGH (ref 0.1–1.0)
Monocytes Relative: 20 % — ABNORMAL HIGH (ref 3–12)
Neutro Abs: 5.2 10*3/uL (ref 1.7–7.7)
Neutrophils Relative %: 56 % (ref 43–77)
Platelets: 482 10*3/uL — ABNORMAL HIGH (ref 150–400)
RBC: 4.63 MIL/uL (ref 4.22–5.81)
RDW: 14.1 % (ref 11.5–15.5)
WBC: 9.2 10*3/uL (ref 4.0–10.5)

## 2012-05-22 LAB — BASIC METABOLIC PANEL
BUN: 10 mg/dL (ref 6–23)
CO2: 26 mEq/L (ref 19–32)
Calcium: 9.6 mg/dL (ref 8.4–10.5)
Chloride: 98 mEq/L (ref 96–112)
Creatinine, Ser: 0.88 mg/dL (ref 0.50–1.35)
GFR calc Af Amer: >90 mL/min (ref >90)
GFR calc non Af Amer: >90 mL/min (ref >90)
Glucose, Bld: 79 mg/dL (ref 70–99)
Potassium: 4.6 mEq/L (ref 3.5–5.1)
Sodium: 137 mEq/L (ref 135–145)

## 2012-05-22 LAB — SURGICAL PCR SCREEN
MRSA, PCR: POSITIVE — AB
Staphylococcus aureus: POSITIVE — AB

## 2012-05-22 MED ORDER — CHLORHEXIDINE GLUCONATE 4 % EX LIQD: 1.0000 "application " | Freq: Once | CUTANEOUS | Status: DC

## 2012-05-22 NOTE — H&P (Signed)
  NTS SOAP Note  Vital Signs:  Vitals as of: 05/22/2012: Systolic 148: Diastolic 88: Heart Rate 79: Temp 98.19F: Height 58ft 9in: Weight 234Lbs 0 Ounces: Pain Level 10: BMI 34.56  BMI : 34.56 kg/m2  Subjective: This 62 Years 27 Months old Male presents for a right buttock abscess.  Has been present for two weeks, but recently has worsened.  Seen by primary care physician today, referred for i and d.  Just prescribed an antibiotic.  Review of Symptoms:  Constitutional:  fatigue    headache Eyes:unremarkable   Nose/Mouth/Throat:unremarkable Cardiovascular:  unremarkable   Respiratory:  dyspnea,cough Gastrointestinal:  unremarkable   Genitourinary:unremarkable       neck, back pain previous h/o skin boils   hay fever   Past Medical History:    Reviewed   Past Medical History  Surgical History: Splenectomy, Partial nephrectomy, pilonidal cyst excision, back surgery, knee surgery, carpal tunnel Medical Problems: HTN, CAD, Hx MI Psychiatric History: depression Allergies: PCN Medications: celexa, asa, metoprolol, zetia, norvasc, gemfibrozil, rapaflo, docusate.   Social History:Reviewed  Social History  Preferred Language: English Ethnicity: Not Hispanic / Latino Age: 62 Years 2 Months Marital Status:  M Alcohol: none Recreational drug(s): none   Smoking Status: Former smoker reviewed on 02/09/2012 Started Date: 02/08/1971 Stopped Date: 09/07/1992 Functional Status reviewed on mm/dd/yyyy ------------------------------------------------ Bathing: Normal Cooking: Normal Dressing: Normal Driving: Normal Eating: Normal Managing Meds: Normal Oral Care: Normal Shopping: Normal Toileting: Normal Transferring: Normal Walking: Normal Cognitive Status reviewed on mm/dd/yyyy ------------------------------------------------ Attention: Normal Decision Making: Normal Language: Normal Memory: Normal Motor: Normal Perception:  Normal Problem Solving: Normal Visual and Spatial: Normal   Family History:  Reviewed   Family History  Is there a family history of: noncontributory    Objective Information: General:  Well appearing, well nourished in no distress.      Large erythematous, indurated area on right buttock.  Some purulent drainage noted. Heart:  RRR, no murmur Lungs:    CTA bilaterally, no wheezes, rhonchi, rales.  Breathing unlabored.  Assessment:Abscess, right buttock  Diagnosis &amp; Procedure Smart Code   Plan:Scheduled for incision and drainage of right buttock abscess on 05/23/12.   Patient Education:Alternative treatments to surgery were discussed with patient (and family).  Risks and benefits  of procedure were fully explained to the patient (and family) who gave informed consent. Patient/family questions were addressed.  Follow-up:Pending Surgery

## 2012-05-22 NOTE — Patient Instructions (Addendum)
SANDERS MANNINEN  05/22/2012   Your procedure is scheduled on:  05/23/12  Report to [facility] at 8:10 AM.  Call this number if you have problems the morning of surgery: [phone]   Remember:   Do not eat food or drink liquids after midnight.   Take these medicines the morning of surgery with A SIP OF WATER: norvasc,celexa,zetia, lopid, toprol   Do not wear jewelry, make-up or nail polish.  Do not wear lotions, powders, or perfumes. You may wear deodorant.  Do not shave 48 hours prior to surgery. Men may shave face and neck.  Do not bring valuables to the hospital.  Contacts, dentures or bridgework may not be worn into surgery.  Leave suitcase in the car. After surgery it may be brought to your room.  For patients admitted to the hospital, checkout time is 11:00 AM the day of  discharge.   Patients discharged the day of surgery will not be allowed to drive  home.  Name and phone number of your driver:  Special Instructions: Shower using CHG 2 nights before surgery and the night before surgery.  If you shower the day of surgery use CHG.  Use special wash - you have one bottle of CHG for all showers.  You should use approximately 1/3 of the bottle for each shower.   Please read over the following fact sheets that you were given: Pain Booklet, Coughing and Deep Breathing, MRSA Information, Surgical Site Infection Prevention, Anesthesia Post-op Instructions and Care and Recovery After Surgery  Incision and Drainage Incision and drainage is a procedure in which a sac-like structure (cystic structure) is opened and drained. The area to be drained usually contains material such as pus, fluid, or blood.  LET YOUR CAREGIVER KNOW ABOUT:   Allergies to medicine.  Medicines taken, including vitamins, herbs, eyedrops, over-the-counter medicines, and creams.  Use of steroids (by mouth or creams).  Previous problems with anesthetics or numbing medicines.  History of bleeding problems or blood  clots.  Previous surgery.  Other health problems, including diabetes and kidney problems.  Possibility of pregnancy, if this applies. RISKS AND COMPLICATIONS  Pain.  Bleeding.  Scarring.  Infection. BEFORE THE PROCEDURE  You may need to have an ultrasound or other imaging tests to see how large or deep your cystic structure is. Blood tests may also be used to determine if you have an infection or how severe the infection is. You may need to have a tetanus shot. PROCEDURE  The affected area is cleaned with a cleaning fluid. The cyst area will then be numbed with a medicine (local anesthetic). A small incision will be made in the cystic structure. A syringe or catheter may be used to drain the contents of the cystic structure, or the contents may be squeezed out. The area will then be flushed with a cleansing solution. After cleansing the area, it is often gently packed with a gauze or another wound dressing. Once it is packed, it will be covered with gauze and tape or some other type of wound dressing. AFTER THE PROCEDURE   Often, you will be allowed to go home right after the procedure.  You may be given antibiotic medicine to prevent or heal an infection.  If the area was packed with gauze or some other wound dressing, you will likely need to come back in 1 to 2 days to get it removed.  The area should heal in about 14 days. Document Released: 07/20/2000 Document Revised:  07/26/2011 Document Reviewed: 03/21/2011 [facility] Patient Information 2013 [place], [place].  PATIENT INSTRUCTIONS POST-ANESTHESIA  IMMEDIATELY FOLLOWING SURGERY:  Do not drive or operate machinery for the first twenty four hours after surgery.  Do not make any important decisions for twenty four hours after surgery or while taking narcotic pain medications or sedatives.  If you develop intractable nausea and vomiting or a severe headache please notify your doctor immediately.  FOLLOW-UP:  Please make an  appointment with your surgeon as instructed. You do not need to follow up with anesthesia unless specifically instructed to do so.  WOUND CARE INSTRUCTIONS (if applicable):  Keep a dry clean dressing on the anesthesia/puncture wound site if there is drainage.  Once the wound has quit draining you may leave it open to air.  Generally you should leave the bandage intact for twenty four hours unless there is drainage.  If the epidural site drains for more than 36-48 hours please call the anesthesia department.  QUESTIONS?:  Please feel free to call your physician or the hospital operator if you have any questions, and they will be happy to assist you.

## 2012-05-23 ENCOUNTER — Ambulatory Visit (HOSPITAL_COMMUNITY): Payer: 59 | Admitting: Anesthesiology

## 2012-05-23 ENCOUNTER — Encounter (HOSPITAL_COMMUNITY): Payer: Self-pay | Admitting: Anesthesiology

## 2012-05-23 ENCOUNTER — Ambulatory Visit (HOSPITAL_COMMUNITY)
Admission: RE | Admit: 2012-05-23 | Discharge: 2012-05-23 | Disposition: A | Discharge status: Home / Self Care | Payer: 59 | Source: Ambulatory Visit | Attending: General Surgery | Admitting: General Surgery

## 2012-05-23 ENCOUNTER — Encounter (HOSPITAL_COMMUNITY): Payer: Self-pay | Admitting: *Deleted

## 2012-05-23 ENCOUNTER — Encounter (HOSPITAL_COMMUNITY)
Admission: RE | Disposition: A | Discharge status: Home / Self Care | Payer: Self-pay | Source: Ambulatory Visit | Attending: General Surgery

## 2012-05-23 DIAGNOSIS — L0231 Cutaneous abscess of buttock: Secondary | ICD-10-CM | POA: Insufficient documentation

## 2012-05-23 HISTORY — PX: INCISION AND DRAINAGE ABSCESS: SHX5864

## 2012-05-23 SURGERY — INCISION AND DRAINAGE, ABSCESS
Anesthesia: General | Site: Buttocks | Laterality: Right | Wound class: Dirty or Infected

## 2012-05-23 MED ORDER — BUPIVACAINE HCL (PF) 0.5 % IJ SOLN
INTRAMUSCULAR | Status: AC
  Filled 2012-05-23: qty 30

## 2012-05-23 MED ORDER — PROPOFOL 10 MG/ML IV BOLUS
INTRAVENOUS | Status: DC | PRN
  Administered 2012-05-23: 180 mg via INTRAVENOUS

## 2012-05-23 MED ORDER — FENTANYL CITRATE 0.05 MG/ML IJ SOLN: 25.0000 ug | INTRAMUSCULAR | Status: DC | PRN

## 2012-05-23 MED ORDER — MUPIROCIN CALCIUM 2 % EX CREA: TOPICAL_CREAM | Freq: Three times a day (TID) | CUTANEOUS | Status: DC

## 2012-05-23 MED ORDER — GLYCOPYRROLATE 0.2 MG/ML IJ SOLN
0.2000 mg | Freq: Once | INTRAMUSCULAR | Status: AC
  Administered 2012-05-23: 0.2 mg via INTRAVENOUS

## 2012-05-23 MED ORDER — SODIUM CHLORIDE 0.9 % IR SOLN
Status: DC | PRN
  Administered 2012-05-23: 1000 mL

## 2012-05-23 MED ORDER — MUPIROCIN 2 % EX OINT
TOPICAL_OINTMENT | CUTANEOUS | Status: AC
  Filled 2012-05-23: qty 22

## 2012-05-23 MED ORDER — FENTANYL CITRATE 0.05 MG/ML IJ SOLN
INTRAMUSCULAR | Status: DC | PRN
  Administered 2012-05-23 (×2): 50 ug via INTRAVENOUS

## 2012-05-23 MED ORDER — MUPIROCIN 2 % EX OINT
1.0000 "application " | TOPICAL_OINTMENT | Freq: Two times a day (BID) | CUTANEOUS | Status: DC
  Administered 2012-05-23: 1 via NASAL

## 2012-05-23 MED ORDER — VANCOMYCIN HCL 10 G IV SOLR
1500.0000 mg | INTRAVENOUS | Status: AC
  Administered 2012-05-23: 1500 mg via INTRAVENOUS
  Filled 2012-05-23: qty 1500

## 2012-05-23 MED ORDER — ONDANSETRON HCL 4 MG/2ML IJ SOLN
INTRAMUSCULAR | Status: AC
  Filled 2012-05-23: qty 2

## 2012-05-23 MED ORDER — ONDANSETRON HCL 4 MG/2ML IJ SOLN
4.0000 mg | Freq: Once | INTRAMUSCULAR | Status: AC
  Administered 2012-05-23: 4 mg via INTRAVENOUS

## 2012-05-23 MED ORDER — FENTANYL CITRATE 0.05 MG/ML IJ SOLN
INTRAMUSCULAR | Status: AC
  Filled 2012-05-23: qty 5

## 2012-05-23 MED ORDER — MIDAZOLAM HCL 2 MG/2ML IJ SOLN
INTRAMUSCULAR | Status: AC
  Filled 2012-05-23: qty 2

## 2012-05-23 MED ORDER — KETOROLAC TROMETHAMINE 30 MG/ML IJ SOLN
30.0000 mg | Freq: Once | INTRAMUSCULAR | Status: AC
  Administered 2012-05-23: 30 mg via INTRAVENOUS

## 2012-05-23 MED ORDER — EPHEDRINE SULFATE 50 MG/ML IJ SOLN
INTRAMUSCULAR | Status: DC | PRN
  Administered 2012-05-23: 10 mg via INTRAVENOUS

## 2012-05-23 MED ORDER — PROPOFOL 10 MG/ML IV EMUL
INTRAVENOUS | Status: AC
  Filled 2012-05-23: qty 20

## 2012-05-23 MED ORDER — BUPIVACAINE HCL (PF) 0.5 % IJ SOLN
INTRAMUSCULAR | Status: DC | PRN
  Administered 2012-05-23: 10 mL

## 2012-05-23 MED ORDER — LIDOCAINE HCL (PF) 1 % IJ SOLN
INTRAMUSCULAR | Status: AC
  Filled 2012-05-23: qty 5

## 2012-05-23 MED ORDER — LIDOCAINE HCL (CARDIAC) 20 MG/ML IV SOLN
INTRAVENOUS | Status: DC | PRN
  Administered 2012-05-23: 30 mg via INTRAVENOUS

## 2012-05-23 MED ORDER — KETOROLAC TROMETHAMINE 30 MG/ML IJ SOLN
INTRAMUSCULAR | Status: AC
  Filled 2012-05-23: qty 1

## 2012-05-23 MED ORDER — GLYCOPYRROLATE 0.2 MG/ML IJ SOLN
INTRAMUSCULAR | Status: AC
  Filled 2012-05-23: qty 1

## 2012-05-23 MED ORDER — LACTATED RINGERS IV SOLN
INTRAVENOUS | Status: DC
  Administered 2012-05-23: 1000 mL via INTRAVENOUS

## 2012-05-23 MED ORDER — MIDAZOLAM HCL 2 MG/2ML IJ SOLN
1.0000 mg | INTRAMUSCULAR | Status: DC | PRN
  Administered 2012-05-23: 2 mg via INTRAVENOUS

## 2012-05-23 MED ORDER — ONDANSETRON HCL 4 MG/2ML IJ SOLN: 4.0000 mg | Freq: Once | INTRAMUSCULAR | Status: DC | PRN

## 2012-05-23 SURGICAL SUPPLY — 25 items
BAG HAMPER (MISCELLANEOUS) ×2 IMPLANT
CLOTH BEACON ORANGE TIMEOUT ST (SAFETY) ×2 IMPLANT
COVER LIGHT HANDLE STERIS (MISCELLANEOUS) ×4 IMPLANT
ELECT REM PT RETURN 9FT ADLT (ELECTROSURGICAL) ×2
ELECTRODE REM PT RTRN 9FT ADLT (ELECTROSURGICAL) ×1 IMPLANT
GAUZE PACKING IODOFORM 2 (PACKING) ×1 IMPLANT
GLOVE BIOGEL M STRL SZ7.5 (GLOVE) ×2 IMPLANT
GLOVE BIOGEL PI IND STRL 7.0 (GLOVE) IMPLANT
GLOVE BIOGEL PI IND STRL 7.5 (GLOVE) IMPLANT
GLOVE BIOGEL PI INDICATOR 7.0 (GLOVE) ×1
GLOVE BIOGEL PI INDICATOR 7.5 (GLOVE) ×1
GLOVE ECLIPSE 6.5 STRL STRAW (GLOVE) ×1 IMPLANT
GOWN STRL REIN XL XLG (GOWN DISPOSABLE) ×4 IMPLANT
KIT ROOM TURNOVER APOR (KITS) ×2 IMPLANT
MANIFOLD NEPTUNE II (INSTRUMENTS) ×2 IMPLANT
MARKER SKIN DUAL TIP RULER LAB (MISCELLANEOUS) ×2 IMPLANT
NS IRRIG 1000ML POUR BTL (IV SOLUTION) ×2 IMPLANT
PACK MINOR (CUSTOM PROCEDURE TRAY) ×2 IMPLANT
PAD ARMBOARD 7.5X6 YLW CONV (MISCELLANEOUS) ×2 IMPLANT
SET BASIN LINEN APH (SET/KITS/TRAYS/PACK) ×2 IMPLANT
SPONGE GAUZE 4X4 12PLY (GAUZE/BANDAGES/DRESSINGS) ×2 IMPLANT
SWAB CULTURE LIQ STUART DBL (MISCELLANEOUS) ×1 IMPLANT
SYR BULB IRRIGATION 50ML (SYRINGE) ×2 IMPLANT
SYR CONTROL 10ML LL (SYRINGE) ×2 IMPLANT
TAPE CLOTH SURG 4X10 WHT LF (GAUZE/BANDAGES/DRESSINGS) ×1 IMPLANT

## 2012-05-23 NOTE — Anesthesia Postprocedure Evaluation (Signed)
  Anesthesia Post-op Note  Patient: [name]  Procedure(s) Performed: Procedure(s): INCISION AND DRAINAGE ABSCESS (Right)  Patient Location: PACU  Anesthesia Type:General  Level of Consciousness: awake  Airway and Oxygen Therapy: Patient Spontanous Breathing  Post-op Pain: none  Post-op Assessment: Post-op Vital signs reviewed, Patient's Cardiovascular Status Stable, Respiratory Function Stable, Patent Airway and No signs of Nausea or vomiting  Post-op Vital Signs: Reviewed and stable  Complications: No apparent anesthesia complications

## 2012-05-23 NOTE — Op Note (Signed)
Patient:  [name]  DOB:  07/04/1950  MRN:  [id number]   Preop Diagnosis:  Abscess, right buttock  Postop Diagnosis:  Same  Procedure:  Incision and drainage of abscess, right buttock  Surgeon:  Franky Macho, M.D.  Anes:  General  Indications:  Patient is a 62 year old white male who presents with a right buttock abscess. He also has tested positive for MRSA. The risks and benefits of the procedure were fully explained to the patient, who gave informed consent.  Procedure note:  The patient was placed in the left lateral decubitus position after general anesthesia was a Optician, dispensing. The right buttock was prepped and draped using usual sterile technique with Betadine. Surgical site confirmation was performed.  A transverse incision was made over the abscess. Purulent fluid was found. Aerobic culture was taken and sent to microbiology. The abscess cavity tracked superiorly and deep. A curette was used to remove any necrotic debris. The wound is irrigated normal saline. The wound was packed with iodoform nugauze. The surrounding tissue was injected with 0.5% Sensorcaine. A dry sterile dressing was applied.  All tape and needle counts were correct the end of the procedure. Patient was awakened and transferred to PACU in stable condition.  Complications:  None  EBL:  Minimal  Specimen:  Aerobic culture of right buttock abscess

## 2012-05-23 NOTE — Transfer of Care (Signed)
Immediate Anesthesia Transfer of Care Note  Patient: [name]  Procedure(s) Performed: Procedure(s): INCISION AND DRAINAGE ABSCESS (Right)  Patient Location: PACU  Anesthesia Type:General  Level of Consciousness: awake, alert  and oriented  Airway & Oxygen Therapy: Patient Spontanous Breathing and Patient connected to face mask oxygen  Post-op Assessment: Report given to PACU RN  Post vital signs: Reviewed and stable  Complications: No apparent anesthesia complications

## 2012-05-23 NOTE — Anesthesia Preprocedure Evaluation (Signed)
Anesthesia Evaluation  Patient identified by MRN, date of birth, ID band Patient awake    Reviewed: Allergy & Precautions, H&P , NPO status , Patient's Chart, lab work & pertinent test results  Airway Mallampati: II TM Distance: >3 FB Neck ROM: Full    Dental  (+) Teeth Intact   Pulmonary sleep apnea , former smoker,  breath sounds clear to auscultation        Cardiovascular hypertension, Pt. on medications - angina+ CAD and + Past MI Rhythm:Regular Rate:Normal     Neuro/Psych PSYCHIATRIC DISORDERS Depression    GI/Hepatic   Endo/Other    Renal/GU      Musculoskeletal   Abdominal   Peds  Hematology   Anesthesia Other Findings   Reproductive/Obstetrics                           Anesthesia Physical Anesthesia Plan  ASA: III  Anesthesia Plan: General   Post-op Pain Management:    Induction: Intravenous  Airway Management Planned: LMA  Additional Equipment:   Intra-op Plan:   Post-operative Plan: Extubation in OR  Informed Consent: I have reviewed the patients History and Physical, chart, labs and discussed the procedure including the risks, benefits and alternatives for the proposed anesthesia with the patient or authorized representative who has indicated his/her understanding and acceptance.     Plan Discussed with:   Anesthesia Plan Comments:         Anesthesia Quick Evaluation

## 2012-05-23 NOTE — Transfer of Care (Signed)
Immediate Anesthesia Transfer of Care Note  Patient: [name]  Procedure(s) Performed: Procedure(s): INCISION AND DRAINAGE ABSCESS (Right)  Patient Location: PACU  Anesthesia Type:General  Level of Consciousness: awake, alert  and oriented  Airway & Oxygen Therapy: Patient Spontanous Breathing  Post-op Assessment: Report given to PACU RN  Post vital signs: Reviewed and stable  Complications: No apparent anesthesia complications

## 2012-05-23 NOTE — Interval H&P Note (Signed)
History and Physical Interval Note:  05/23/2012 8:19 AM  [name]  has presented today for surgery, with the diagnosis of abscess right buttock  The various methods of treatment have been discussed with the patient and family. After consideration of risks, benefits and other options for treatment, the patient has consented to  Procedure(s): INCISION AND DRAINAGE ABSCESS (Right) as a surgical intervention .  The patient's history has been reviewed, patient examined, no change in status, stable for surgery.  I have reviewed the patient's chart and labs.  Questions were answered to the patient's satisfaction.     Franky Macho A

## 2012-05-23 NOTE — Anesthesia Procedure Notes (Signed)
Procedure Name: LMA Insertion Date/Time: 05/23/2012 9:04 AM Performed by: Glynn Octave E Pre-anesthesia Checklist: Patient identified, Patient being monitored, Emergency Drugs available, Timeout performed and Suction available Patient Re-evaluated:Patient Re-evaluated prior to inductionOxygen Delivery Method: Circle System Utilized Preoxygenation: Pre-oxygenation with 100% oxygen Intubation Type: IV induction Ventilation: Mask ventilation without difficulty LMA: LMA inserted LMA Size: 4.0 Number of attempts: 1 Placement Confirmation: positive ETCO2 and breath sounds checked- equal and bilateral

## 2012-05-24 ENCOUNTER — Encounter (HOSPITAL_COMMUNITY): Payer: Self-pay | Admitting: General Surgery

## 2012-05-26 LAB — CULTURE, ROUTINE-ABSCESS

## 2012-06-08 ENCOUNTER — Ambulatory Visit
Admission: RE | Admit: 2012-06-08 | Discharge: 2012-06-08 | Disposition: A | Discharge status: Home / Self Care | Payer: 59 | Source: Ambulatory Visit | Attending: Neurosurgery | Admitting: Neurosurgery

## 2012-06-08 DIAGNOSIS — M5137 Other intervertebral disc degeneration, lumbosacral region: Secondary | ICD-10-CM

## 2012-06-08 DIAGNOSIS — M47817 Spondylosis without myelopathy or radiculopathy, lumbosacral region: Secondary | ICD-10-CM

## 2012-06-19 ENCOUNTER — Other Ambulatory Visit: Payer: Self-pay | Admitting: Internal Medicine

## 2012-06-19 DIAGNOSIS — E041 Nontoxic single thyroid nodule: Secondary | ICD-10-CM

## 2012-06-28 ENCOUNTER — Ambulatory Visit
Admission: RE | Admit: 2012-06-28 | Discharge: 2012-06-28 | Disposition: A | Discharge status: Home / Self Care | Payer: 59 | Source: Ambulatory Visit | Attending: Internal Medicine | Admitting: Internal Medicine

## 2012-06-28 ENCOUNTER — Other Ambulatory Visit (HOSPITAL_COMMUNITY)
Admission: RE | Admit: 2012-06-28 | Discharge: 2012-06-28 | Disposition: A | Discharge status: Home / Self Care | Payer: 59 | Source: Ambulatory Visit | Attending: Interventional Radiology | Admitting: Interventional Radiology

## 2012-06-28 DIAGNOSIS — E049 Nontoxic goiter, unspecified: Secondary | ICD-10-CM | POA: Insufficient documentation

## 2012-06-28 DIAGNOSIS — E041 Nontoxic single thyroid nodule: Secondary | ICD-10-CM

## 2012-09-06 ENCOUNTER — Other Ambulatory Visit: Payer: Self-pay | Admitting: *Deleted

## 2012-09-06 DIAGNOSIS — R011 Cardiac murmur, unspecified: Secondary | ICD-10-CM

## 2012-09-06 DIAGNOSIS — R0989 Other specified symptoms and signs involving the circulatory and respiratory systems: Secondary | ICD-10-CM

## 2012-09-12 ENCOUNTER — Ambulatory Visit (HOSPITAL_COMMUNITY)
Admission: RE | Admit: 2012-09-12 | Discharge: 2012-09-12 | Disposition: A | Discharge status: Home / Self Care | Payer: 59 | Source: Ambulatory Visit | Attending: Cardiovascular Disease | Admitting: Cardiovascular Disease

## 2012-09-12 ENCOUNTER — Other Ambulatory Visit: Payer: Self-pay | Admitting: Cardiovascular Disease

## 2012-09-12 DIAGNOSIS — R0989 Other specified symptoms and signs involving the circulatory and respiratory systems: Secondary | ICD-10-CM | POA: Insufficient documentation

## 2012-09-12 DIAGNOSIS — Z6834 Body mass index (BMI) 34.0-34.9, adult: Secondary | ICD-10-CM | POA: Insufficient documentation

## 2012-09-12 DIAGNOSIS — R011 Cardiac murmur, unspecified: Secondary | ICD-10-CM | POA: Insufficient documentation

## 2012-09-12 LAB — COMPREHENSIVE METABOLIC PANEL
ALT: 16 U/L (ref 0–53)
AST: 20 U/L (ref 0–37)
Albumin: 4.2 g/dL (ref 3.5–5.2)
Alkaline Phosphatase: 93 U/L (ref 39–117)
BUN: 11 mg/dL (ref 6–23)
CO2: 29 mEq/L (ref 19–32)
Calcium: 9.5 mg/dL (ref 8.4–10.5)
Chloride: 102 mEq/L (ref 96–112)
Creat: 0.82 mg/dL (ref 0.50–1.35)
Glucose, Bld: 93 mg/dL (ref 70–99)
Potassium: 5 mEq/L (ref 3.5–5.3)
Sodium: 136 mEq/L (ref 135–145)
Total Bilirubin: 0.5 mg/dL (ref 0.3–1.2)
Total Protein: 7.6 g/dL (ref 6.0–8.3)

## 2012-09-12 LAB — CBC WITH DIFFERENTIAL/PLATELET
Basophils Absolute: 0.1 10*3/uL (ref 0.0–0.1)
Basophils Relative: 1 % (ref 0–1)
Eosinophils Absolute: 0.2 10*3/uL (ref 0.0–0.7)
Eosinophils Relative: 4 % (ref 0–5)
HCT: 42.9 % (ref 39.0–52.0)
Hemoglobin: 14.9 g/dL (ref 13.0–17.0)
Lymphocytes Relative: 36 % (ref 12–46)
Lymphs Abs: 2 10*3/uL (ref 0.7–4.0)
MCH: 30.2 pg (ref 26.0–34.0)
MCHC: 34.7 g/dL (ref 30.0–36.0)
MCV: 86.8 fL (ref 78.0–100.0)
Monocytes Absolute: 0.8 10*3/uL (ref 0.1–1.0)
Monocytes Relative: 15 % — ABNORMAL HIGH (ref 3–12)
Neutro Abs: 2.4 10*3/uL (ref 1.7–7.7)
Neutrophils Relative %: 44 % (ref 43–77)
Platelets: 521 10*3/uL — ABNORMAL HIGH (ref 150–400)
RBC: 4.94 MIL/uL (ref 4.22–5.81)
RDW: 14.3 % (ref 11.5–15.5)
WBC: 5.5 10*3/uL (ref 4.0–10.5)

## 2012-09-12 NOTE — Progress Notes (Signed)
*  PRELIMINARY RESULTS* Echocardiogram 2D Echocardiogram has been performed.  Conrad Tea 09/12/2012, 9:03 AM

## 2012-09-13 ENCOUNTER — Encounter: Payer: Self-pay | Admitting: Cardiovascular Disease

## 2013-02-15 ENCOUNTER — Other Ambulatory Visit: Payer: Self-pay

## 2013-02-15 ENCOUNTER — Emergency Department (HOSPITAL_COMMUNITY): Payer: 59

## 2013-02-15 ENCOUNTER — Observation Stay (HOSPITAL_COMMUNITY)
Admission: EM | Admit: 2013-02-15 | Discharge: 2013-02-16 | Disposition: A | Discharge status: Home / Self Care | Payer: 59 | Attending: Internal Medicine | Admitting: Internal Medicine

## 2013-02-15 ENCOUNTER — Encounter (HOSPITAL_COMMUNITY): Payer: Self-pay | Admitting: Emergency Medicine

## 2013-02-15 DIAGNOSIS — E785 Hyperlipidemia, unspecified: Secondary | ICD-10-CM | POA: Diagnosis present

## 2013-02-15 DIAGNOSIS — N200 Calculus of kidney: Secondary | ICD-10-CM | POA: Insufficient documentation

## 2013-02-15 DIAGNOSIS — Z87891 Personal history of nicotine dependence: Secondary | ICD-10-CM | POA: Insufficient documentation

## 2013-02-15 DIAGNOSIS — I10 Essential (primary) hypertension: Secondary | ICD-10-CM | POA: Diagnosis present

## 2013-02-15 DIAGNOSIS — M19049 Primary osteoarthritis, unspecified hand: Secondary | ICD-10-CM

## 2013-02-15 DIAGNOSIS — M7150 Other bursitis, not elsewhere classified, unspecified site: Secondary | ICD-10-CM

## 2013-02-15 DIAGNOSIS — R079 Chest pain, unspecified: Principal | ICD-10-CM | POA: Diagnosis present

## 2013-02-15 DIAGNOSIS — I2510 Atherosclerotic heart disease of native coronary artery without angina pectoris: Secondary | ICD-10-CM | POA: Diagnosis present

## 2013-02-15 DIAGNOSIS — F329 Major depressive disorder, single episode, unspecified: Secondary | ICD-10-CM | POA: Insufficient documentation

## 2013-02-15 DIAGNOSIS — G4730 Sleep apnea, unspecified: Secondary | ICD-10-CM | POA: Insufficient documentation

## 2013-02-15 DIAGNOSIS — E669 Obesity, unspecified: Secondary | ICD-10-CM | POA: Insufficient documentation

## 2013-02-15 DIAGNOSIS — F3289 Other specified depressive episodes: Secondary | ICD-10-CM | POA: Insufficient documentation

## 2013-02-15 HISTORY — DX: Obesity, unspecified: E66.9

## 2013-02-15 HISTORY — DX: Acute myocardial infarction, unspecified: I21.9

## 2013-02-15 HISTORY — DX: Hyperlipidemia, unspecified: E78.5

## 2013-02-15 LAB — COMPREHENSIVE METABOLIC PANEL
ALT: 18 U/L (ref 0–53)
AST: 20 U/L (ref 0–37)
Albumin: 4 g/dL (ref 3.5–5.2)
Alkaline Phosphatase: 114 U/L (ref 39–117)
BUN: 12 mg/dL (ref 6–23)
CO2: 27 mEq/L (ref 19–32)
Calcium: 9.7 mg/dL (ref 8.4–10.5)
Chloride: 99 mEq/L (ref 96–112)
Creatinine, Ser: 1.02 mg/dL (ref 0.50–1.35)
GFR calc Af Amer: 88 mL/min — ABNORMAL LOW (ref >90)
GFR calc non Af Amer: 76 mL/min — ABNORMAL LOW (ref >90)
Glucose, Bld: 159 mg/dL — ABNORMAL HIGH (ref 70–99)
Potassium: 3.2 mEq/L — ABNORMAL LOW (ref 3.7–5.3)
Sodium: 140 mEq/L (ref 137–147)
Total Bilirubin: 0.2 mg/dL — ABNORMAL LOW (ref 0.3–1.2)
Total Protein: 8.2 g/dL (ref 6.0–8.3)

## 2013-02-15 LAB — CBC WITH DIFFERENTIAL/PLATELET
Basophils Absolute: 0 10*3/uL (ref 0.0–0.1)
Basophils Relative: 1 % (ref 0–1)
Eosinophils Absolute: 0.3 10*3/uL (ref 0.0–0.7)
Eosinophils Relative: 4 % (ref 0–5)
HCT: 43.1 % (ref 39.0–52.0)
Hemoglobin: 15.1 g/dL (ref 13.0–17.0)
Lymphocytes Relative: 35 % (ref 12–46)
Lymphs Abs: 2.5 10*3/uL (ref 0.7–4.0)
MCH: 31.3 pg (ref 26.0–34.0)
MCHC: 35 g/dL (ref 30.0–36.0)
MCV: 89.4 fL (ref 78.0–100.0)
Monocytes Absolute: 1.1 10*3/uL — ABNORMAL HIGH (ref 0.1–1.0)
Monocytes Relative: 15 % — ABNORMAL HIGH (ref 3–12)
Neutro Abs: 3.4 10*3/uL (ref 1.7–7.7)
Neutrophils Relative %: 46 % (ref 43–77)
Platelets: 481 10*3/uL — ABNORMAL HIGH (ref 150–400)
RBC: 4.82 MIL/uL (ref 4.22–5.81)
RDW: 13.7 % (ref 11.5–15.5)
WBC: 7.3 10*3/uL (ref 4.0–10.5)

## 2013-02-15 LAB — TROPONIN I: Troponin I: <0.3 ng/mL (ref <0.30)

## 2013-02-15 MED ORDER — OXYCODONE HCL 5 MG PO TABS
5.0000 mg | ORAL_TABLET | ORAL | Status: DC | PRN
  Administered 2013-02-15: 5 mg via ORAL

## 2013-02-15 MED ORDER — SODIUM CHLORIDE 0.9 % IV SOLN: INTRAVENOUS | Status: DC

## 2013-02-15 MED ORDER — OXYCODONE HCL 5 MG PO TABS
ORAL_TABLET | ORAL | Status: AC
  Filled 2013-02-15: qty 1

## 2013-02-15 MED ORDER — POTASSIUM CHLORIDE CRYS ER 20 MEQ PO TBCR
40.0000 meq | EXTENDED_RELEASE_TABLET | Freq: Once | ORAL | Status: AC
  Administered 2013-02-15: 40 meq via ORAL
  Filled 2013-02-15: qty 2

## 2013-02-15 NOTE — ED Notes (Addendum)
C/O bilateral upper arm and neck pain off and on x 2-3 days. Mild nausea.  Denies SOB, diaphoresis, vomiting or fevers.  Fine, raised, red, puritic rash noted over neck, shoulders and forearms.  Hx MI.  C/O HA as well.

## 2013-02-15 NOTE — H&P (Addendum)
PATIENT DETAILS Name: [name] Age: 63 y.o. Sex: male Date of Birth: [birth year] Admit Date: 02/15/2013 [id number] J., MD   CHIEF COMPLAINT:  Chest pain for past 2 days  HPI: [name] is a 63 y.o. male with a Past Medical History of CAD status post PTCI in the 1990s, attention and dyslipidemia who presents today with the above noted complaint. Patient started having chest pain yesterday evening, chest pain is located in the upper chest and lower neck, and is mostly in the midline. Pain is approximately 6-7/10 at its worse, radiating to both his arms, associated with nausea, facial flushing and some mild diaphoresis. There is no associated shortness of breath. Patient has had 2 episodes of chest pain, one last night and one this evening. He was brought to the emergency room, I was asked to admit this patient for further evaluation and treatment. During my evaluation, patient was not had any chest pain, he appeared comfortable. Patient also complains of mild headaches for which he usually takes Fiorocet. Patient denies any abdominal pain, vomiting, diarrhea or fever.   ALLERGIES:   Allergies  Allergen Reactions  . Niacin And Related Other (See Comments)    flush  . Penicillins     PAST MEDICAL HISTORY: Past Medical History  Diagnosis Date  . Depression   . Heart attack   . Hypertension   . Sleep apnea   . Renal disorder     kidney stones  . Complication of anesthesia     heart rate dropped twice during surg  . Arthritis   . Myocardial infarction     PAST SURGICAL HISTORY: Past Surgical History  Procedure Laterality Date  . Knee surgery    . Back surgery    . Wrist surgery      carpal tunnel  . Cystectomy    . Nose surgery    . Pilonidal cyst excision    . Heart stent    . Incision and drainage abscess Right 05/23/2012    Procedure: INCISION AND DRAINAGE ABSCESS;  Surgeon: [name], MD;  Location: [facility];  Service: General;  Laterality:  Right;  . Splenectomy      MEDICATIONS AT HOME: Prior to Admission medications   Medication Sig Start Date End Date Taking? Authorizing Provider  ALPRAZolam Duanne Moron) 0.5 MG tablet Take 0.5 mg by mouth 4 (four) times daily as needed for anxiety or sleep.   Yes Historical Provider, MD  amLODipine (NORVASC) 2.5 MG tablet Take 2.5 mg by mouth daily.   Yes Historical Provider, MD  aspirin 81 MG tablet Take 162 mg by mouth every morning.    Yes Historical Provider, MD  citalopram (CELEXA) 10 MG tablet Take 10 mg by mouth daily.   Yes Historical Provider, MD  desoximetasone (TOPICORT) 0.25 % cream Apply 1 application topically daily as needed. For rash/irritation 11/10/12  Yes Historical Provider, MD  ezetimibe (ZETIA) 10 MG tablet Take 10 mg by mouth daily.   Yes Historical Provider, MD  gemfibrozil (LOPID) 600 MG tablet Take 600 mg by mouth 2 (two) times daily.   Yes Historical Provider, MD  metoprolol succinate (TOPROL-XL) 50 MG 24 hr tablet Take 50 mg by mouth daily. Take with or immediately following a meal.   Yes Historical Provider, MD  mupirocin cream (BACTROBAN) 2 % Apply topically 3 (three) times daily. 05/23/12  Yes [name], MD  nitroGLYCERIN (NITROSTAT) 0.4 MG SL tablet Place 0.4 mg under the tongue every 5 (five) minutes  as needed for chest pain.   Yes Historical Provider, MD  ramipril (ALTACE) 10 MG capsule Take 10 mg by mouth daily.   Yes Historical Provider, MD    FAMILY HISTORY: CAD-father  SOCIAL HISTORY: He quit smoking in 1994  REVIEW OF SYSTEMS:  Constitutional:   No  weight loss, night sweats,  Fevers, chills, fatigue.  HEENT:    No headaches, Difficulty swallowing,Tooth/dental problems,Sore throat,  No sneezing, itching, ear ache, nasal congestion, post nasal drip,   Cardio-vascular: No  Orthopnea, PND, swelling in lower extremities, anasarca,  dizziness, palpitations  GI:  No heartburn, indigestion, abdominal pain, nausea, vomiting, diarrhea, change in   bowel habits, loss of appetite  Resp: No shortness of breath with exertion or at rest.  No excess mucus, no productive cough, No non-productive cough,  No coughing up of blood.No change in color of mucus.No wheezing.No chest wall deformity  Skin:  no rash or lesions.  GU:  no dysuria, change in color of urine, no urgency or frequency.  No flank pain.  Musculoskeletal: No joint pain or swelling.  No decreased range of motion.  No back pain.  Psych: No change in mood or affect. No depression or anxiety.  No memory loss.   PHYSICAL EXAM: Blood pressure 123/68, pulse 77, temperature 98.3 F (36.8 C), temperature source Oral, resp. rate 16, height 5\' 9"  (1.753 m), weight 102.059 kg (225 lb), SpO2 91.00%.  General appearance :Awake, alert, not in any distress. Speech Clear. Not toxic Looking HEENT: Atraumatic and Normocephalic, pupils equally reactive to light and accomodation Neck: supple, no JVD. No cervical lymphadenopathy.  Chest:Good air entry bilaterally, no added sounds  CVS: S1 S2 regular, no murmurs.  Abdomen: Bowel sounds present, Non tender and not distended with no gaurding, rigidity or rebound. Extremities: B/L Lower Ext shows no edema, both legs are warm to touch Neurology: Awake alert, and oriented X 3, CN II-XII intact, Non focal Skin:No Rash Wounds:N/A  LABS ON ADMISSION:   Recent Labs  02/15/13 2140  NA 140  K 3.2*  CL 99  CO2 27  GLUCOSE 159*  BUN 12  CREATININE 1.02  CALCIUM 9.7    Recent Labs  02/15/13 2140  AST 20  ALT 18  ALKPHOS 114  BILITOT 0.2*  PROT 8.2  ALBUMIN 4.0   No results found for this basename: LIPASE, AMYLASE,  in the last 72 hours  Recent Labs  02/15/13 2140  WBC 7.3  NEUTROABS 3.4  HGB 15.1  HCT 43.1  MCV 89.4  PLT 481*    Recent Labs  02/15/13 2140  TROPONINI <0.30   No results found for this basename: DDIMER,  in the last 72 hours No components found with this basename: POCBNP,    RADIOLOGIC STUDIES ON  ADMISSION: Dg Chest 2 View  02/15/2013   CLINICAL DATA:  Shortness of breath and nausea  EXAM: CHEST  2 VIEW  COMPARISON:  09/08/2009  FINDINGS: Cardiac shadow is within normal limits. The lungs are well aerated bilaterally. Few scattered calcified granulomas are again seen. No focal infiltrate or sizable effusion is noted. No bony abnormality is seen.  IMPRESSION: No acute abnormality noted   Electronically Signed   By: Inez Catalina M.D.   On: 02/15/2013 21:56     EKG: Independently reviewed. Normal sinus rhythm  ASSESSMENT AND PLAN: Present on Admission:  . Chest pain - With both typical and atypical features. However has history of CAD.  - Case discussed with Dr. Golden Hurter  cardiology on call, we will transfer patient to [facility] for cardiology evaluation. Case was also discussed with hospitalist at [facility] - Patient will be admitted to a telemetry unit, cardiac enzymes will be cycled, patient will continue on aspirin, statins and beta blockers.  - Patient will be monitored closely, we will await further recommendations from cardiology.  Marland Kitchen HTN (hypertension) - Currently stable, continue with preadmission medications   . CAD (coronary artery disease) - Known history of CAD, prior cardiac catheterization in 2009 revealed total occlusion of RCA with good collaterals.   . Dyslipidemia - Continue preadmission medications  Further plan will depend as patient's clinical course evolves and further radiologic and laboratory data become available. Patient will be monitored closely.   Above noted plan was discussed with patient/spouse , they were in agreement.   DVT Prophylaxis: Prophylactic Lovenox  Code Status: Full Code  Total time spent for admission equals 45 minutes.  [facility] Pager [phone]  If 7PM-7AM, please contact night-coverage [email or web address] Password [facility] 02/15/2013, 11:37 PM

## 2013-02-15 NOTE — ED Provider Notes (Signed)
CSN: [id number]     Arrival date & time 02/15/13  2129 History  This chart was scribed for Gregory Jacobsen, MD by Elby Beck, ED Scribe. This patient was seen in room [facility] and the patient's care was started at 9:39 PM.   Chief Complaint  Patient presents with  . Chest Pain    The history is provided by the patient. No language interpreter was used.    HPI Comments: [name] is a 63 y.o. male with a history of MI and HTN who presents to the Emergency Department complaining of gradually worsening intermittent chest pain described as "soreness" over the past 2 days. He states that he has been having 25-30 minute episodes of this chest pain. He reports associated nausea during his episodes of chest pain. He states that the pain onsets while at rest and there are no modifying factors for his pain. He states that he is prescribed Nitro, but has not been taking this due to not wanting the side effect of headaches. He states that he has a history of cardiac catheterization, most recently in 2006 or 2007. He states that he last had a stress test about 2 years ago. He states that he had jaw pain with the MI he had in the past and he denies jaw pain over the past 2 days. He denies diaphoresis or any other symptoms.   Past Medical History  Diagnosis Date  . Depression   . Heart attack   . Hypertension   . Sleep apnea   . Renal disorder     kidney stones  . Complication of anesthesia     heart rate dropped twice during surg  . Arthritis   . Myocardial infarction    Past Surgical History  Procedure Laterality Date  . Knee surgery    . Back surgery    . Wrist surgery      carpal tunnel  . Cystectomy    . Nose surgery    . Pilonidal cyst excision    . Heart stent    . Incision and drainage abscess Right 05/23/2012    Procedure: INCISION AND DRAINAGE ABSCESS;  Surgeon: [name], MD;  Location: [facility];  Service: General;  Laterality: Right;  . Splenectomy     History  reviewed. No pertinent family history. History  Substance Use Topics  . Smoking status: Former Research scientist (life sciences)  . Smokeless tobacco: Not on file  . Alcohol Use: No    Review of Systems  Constitutional: Negative for diaphoresis.  HENT: Negative for dental problem (denies jaw pain).   Cardiovascular: Positive for chest pain.  Gastrointestinal: Positive for nausea. Negative for vomiting.  All other systems reviewed and are negative.   Allergies  Niacin and related and Penicillins  Home Medications   Current Outpatient Rx  Name  Route  Sig  Dispense  Refill  . amLODipine (NORVASC) 2.5 MG tablet   Oral   Take 2.5 mg by mouth daily.         Marland Kitchen aspirin 81 MG tablet   Oral   Take 81 mg by mouth 2 (two) times daily.         . citalopram (CELEXA) 10 MG tablet   Oral   Take 10 mg by mouth daily.         Marland Kitchen doxycycline (VIBRAMYCIN) 100 MG capsule   Oral   Take 1 capsule (100 mg total) by mouth 2 (two) times daily.   40 capsule  0   . ezetimibe (ZETIA) 10 MG tablet   Oral   Take 10 mg by mouth daily.         Marland Kitchen gemfibrozil (LOPID) 600 MG tablet   Oral   Take 600 mg by mouth 2 (two) times daily.         . metoprolol succinate (TOPROL-XL) 50 MG 24 hr tablet   Oral   Take 50 mg by mouth daily. Take with or immediately following a meal.         . mupirocin cream (BACTROBAN) 2 %   Topical   Apply topically 3 (three) times daily.   15 g   2   . nabumetone (RELAFEN) 500 MG tablet   Oral   Take 500 mg by mouth 2 (two) times daily.         Marland Kitchen oxyCODONE-acetaminophen (PERCOCET) 7.5-325 MG per tablet   Oral   Take 1 tablet by mouth every 4 (four) hours as needed for pain.         . ramipril (ALTACE) 10 MG capsule   Oral   Take 10 mg by mouth daily.         . sennosides-docusate sodium (SENOKOT-S) 8.6-50 MG tablet   Oral   Take 1 tablet by mouth daily.         . silodosin (RAPAFLO) 8 MG CAPS capsule   Oral   Take 8 mg by mouth daily.          Triage  Vitals: BP 161/94  Pulse 98  Temp(Src) 98.3 F (36.8 C) (Oral)  Resp 18  Ht 5\' 9"  (1.753 m)  Wt 225 lb (102.059 kg)  BMI 33.21 kg/m2  SpO2 96%  Physical Exam  Nursing note and vitals reviewed. Constitutional: He is oriented to person, place, and time. He appears well-developed and well-nourished.  Non-toxic appearance. No distress.  HENT:  Head: Normocephalic and atraumatic.  Eyes: Conjunctivae, EOM and lids are normal. Pupils are equal, round, and reactive to light.  Neck: Normal range of motion. Neck supple. No tracheal deviation present. No mass present.  Cardiovascular: Normal rate, regular rhythm and normal heart sounds.  Exam reveals no gallop.   No murmur heard. Pulmonary/Chest: Effort normal and breath sounds normal. No stridor. No respiratory distress. He has no decreased breath sounds. He has no wheezes. He has no rhonchi. He has no rales.  Abdominal: Soft. Normal appearance and bowel sounds are normal. He exhibits no distension. There is no tenderness. There is no rebound and no CVA tenderness.  Musculoskeletal: Normal range of motion. He exhibits no edema and no tenderness.  Neurological: He is alert and oriented to person, place, and time. He has normal strength. No cranial nerve deficit or sensory deficit. GCS eye subscore is 4. GCS verbal subscore is 5. GCS motor subscore is 6.  Skin: Skin is warm and dry. No abrasion and no rash noted.  Psychiatric: He has a normal mood and affect. His speech is normal and behavior is normal.    ED Course  Procedures (including critical care time)  DIAGNOSTIC STUDIES: Oxygen Saturation is 96% on RA, normal by my interpretation.    COORDINATION OF CARE: 9:47 PM- Discussed plan to obtain diagnostic lab work and a CXR. Pt advised of plan for treatment and pt agrees.  Medications  0.9 %  sodium chloride infusion (not administered)   Labs Review Labs Reviewed  CBC WITH DIFFERENTIAL - Abnormal; Notable for the following:     Platelets 481 (*)  Monocytes Relative 15 (*)    Monocytes Absolute 1.1 (*)    All other components within normal limits  TROPONIN I  COMPREHENSIVE METABOLIC PANEL   Imaging Review Dg Chest 2 View  02/15/2013   CLINICAL DATA:  Shortness of breath and nausea  EXAM: CHEST  2 VIEW  COMPARISON:  09/08/2009  FINDINGS: Cardiac shadow is within normal limits. The lungs are well aerated bilaterally. Few scattered calcified granulomas are again seen. No focal infiltrate or sizable effusion is noted. No bony abnormality is seen.  IMPRESSION: No acute abnormality noted   Electronically Signed   By: Inez Catalina M.D.   On: 02/15/2013 21:56    EKG Interpretation   None       MDM  No diagnosis found.  Date: 02/15/2013  Rate: 94  Rhythm: normal sinus rhythm  QRS Axis: normal  Intervals: normal  ST/T Wave abnormalities: nonspecific ST changes  Conduction Disutrbances:none  Narrative Interpretation:   Old EKG Reviewed: none available  Patient to be admitted for evaluation of chest pain    I personally performed the services described in this documentation, which was scribed in my presence. The recorded information has been reviewed and is accurate.      Gregory Jacobsen, MD 02/15/13 2245

## 2013-02-15 NOTE — ED Notes (Signed)
Called report to [place], RN on unit 300.  During report told by Dr. Sloan Leiter patient would be transferring to [facility].

## 2013-02-15 NOTE — ED Notes (Signed)
INtermittent  Cp since yesterday, worse last 20 min with sob and nausea

## 2013-02-16 ENCOUNTER — Encounter (HOSPITAL_COMMUNITY): Payer: Self-pay | Admitting: Physician Assistant

## 2013-02-16 ENCOUNTER — Observation Stay (HOSPITAL_COMMUNITY): Payer: 59

## 2013-02-16 DIAGNOSIS — R079 Chest pain, unspecified: Secondary | ICD-10-CM

## 2013-02-16 LAB — CBC
HCT: 41.6 % (ref 39.0–52.0)
Hemoglobin: 14.2 g/dL (ref 13.0–17.0)
MCH: 30.7 pg (ref 26.0–34.0)
MCHC: 34.1 g/dL (ref 30.0–36.0)
MCV: 89.8 fL (ref 78.0–100.0)
Platelets: 467 10*3/uL — ABNORMAL HIGH (ref 150–400)
RBC: 4.63 MIL/uL (ref 4.22–5.81)
RDW: 13.9 % (ref 11.5–15.5)
WBC: 7.4 10*3/uL (ref 4.0–10.5)

## 2013-02-16 LAB — CREATININE, SERUM
Creatinine, Ser: 0.93 mg/dL (ref 0.50–1.35)
GFR calc Af Amer: >90 mL/min (ref >90)
GFR calc non Af Amer: 88 mL/min — ABNORMAL LOW (ref >90)

## 2013-02-16 LAB — LIPID PANEL
Cholesterol: 163 mg/dL (ref 0–200)
HDL: 31 mg/dL — ABNORMAL LOW (ref >39)
LDL Cholesterol: 112 mg/dL — ABNORMAL HIGH (ref 0–99)
Total CHOL/HDL Ratio: 5.3 RATIO
Triglycerides: 100 mg/dL (ref <150)
VLDL: 20 mg/dL (ref 0–40)

## 2013-02-16 LAB — TROPONIN I
Troponin I: <0.3 ng/mL (ref <0.30)
Troponin I: <0.3 ng/mL (ref <0.30)
Troponin I: <0.3 ng/mL (ref <0.30)

## 2013-02-16 LAB — CK: Total CK: 92 U/L (ref 7–232)

## 2013-02-16 LAB — MRSA PCR SCREENING: MRSA by PCR: NEGATIVE

## 2013-02-16 LAB — SEDIMENTATION RATE: Sed Rate: 28 mm/hr — ABNORMAL HIGH (ref 0–16)

## 2013-02-16 LAB — TSH: TSH: 0.636 u[IU]/mL (ref 0.350–4.500)

## 2013-02-16 MED ORDER — RAMIPRIL 10 MG PO CAPS
10.0000 mg | ORAL_CAPSULE | Freq: Every day | ORAL | Status: DC
Start: 2013-02-16 — End: 2013-02-16
  Administered 2013-02-16: 10 mg via ORAL
  Filled 2013-02-16: qty 1

## 2013-02-16 MED ORDER — ALPRAZOLAM 0.5 MG PO TABS: 0.5000 mg | ORAL_TABLET | Freq: Four times a day (QID) | ORAL | Status: DC | PRN

## 2013-02-16 MED ORDER — ONDANSETRON HCL 4 MG PO TABS: 4.0000 mg | ORAL_TABLET | Freq: Four times a day (QID) | ORAL | Status: DC | PRN

## 2013-02-16 MED ORDER — NITROGLYCERIN 0.4 MG SL SUBL: 0.4000 mg | SUBLINGUAL_TABLET | SUBLINGUAL | Status: DC | PRN

## 2013-02-16 MED ORDER — ALBUTEROL SULFATE (2.5 MG/3ML) 0.083% IN NEBU: 2.5000 mg | INHALATION_SOLUTION | RESPIRATORY_TRACT | Status: DC | PRN

## 2013-02-16 MED ORDER — MORPHINE SULFATE 2 MG/ML IJ SOLN: 1.0000 mg | INTRAMUSCULAR | Status: DC | PRN

## 2013-02-16 MED ORDER — TECHNETIUM TC 99M SESTAMIBI GENERIC - CARDIOLITE
10.0000 | Freq: Once | INTRAVENOUS | Status: AC | PRN
  Administered 2013-02-16: 10 via INTRAVENOUS

## 2013-02-16 MED ORDER — ASPIRIN EC 325 MG PO TBEC
DELAYED_RELEASE_TABLET | ORAL | Status: AC
  Administered 2013-02-16: 325 mg via ORAL
  Filled 2013-02-16: qty 1

## 2013-02-16 MED ORDER — EZETIMIBE 10 MG PO TABS
10.0000 mg | ORAL_TABLET | Freq: Every day | ORAL | Status: DC
  Administered 2013-02-16: 10 mg via ORAL
  Filled 2013-02-16: qty 1

## 2013-02-16 MED ORDER — SODIUM CHLORIDE 0.9 % IJ SOLN
3.0000 mL | Freq: Two times a day (BID) | INTRAMUSCULAR | Status: DC
  Administered 2013-02-16: 3 mL via INTRAVENOUS

## 2013-02-16 MED ORDER — ONDANSETRON HCL 4 MG/2ML IJ SOLN: 4.0000 mg | Freq: Four times a day (QID) | INTRAMUSCULAR | Status: DC | PRN

## 2013-02-16 MED ORDER — CITALOPRAM HYDROBROMIDE 10 MG PO TABS
10.0000 mg | ORAL_TABLET | Freq: Every day | ORAL | Status: DC
  Administered 2013-02-16: 10 mg via ORAL
  Filled 2013-02-16: qty 1

## 2013-02-16 MED ORDER — ALUM & MAG HYDROXIDE-SIMETH 200-200-20 MG/5ML PO SUSP: 30.0000 mL | Freq: Four times a day (QID) | ORAL | Status: DC | PRN

## 2013-02-16 MED ORDER — ENOXAPARIN SODIUM 40 MG/0.4ML ~~LOC~~ SOLN
40.0000 mg | SUBCUTANEOUS | Status: DC
  Filled 2013-02-16: qty 0.4

## 2013-02-16 MED ORDER — AMLODIPINE BESYLATE 2.5 MG PO TABS
2.5000 mg | ORAL_TABLET | Freq: Every day | ORAL | Status: DC
  Administered 2013-02-16: 2.5 mg via ORAL
  Filled 2013-02-16: qty 1

## 2013-02-16 MED ORDER — SODIUM CHLORIDE 0.9 % IV SOLN
INTRAVENOUS | Status: DC
  Administered 2013-02-16: 02:00:00 via INTRAVENOUS

## 2013-02-16 MED ORDER — ACETAMINOPHEN 325 MG PO TABS: 650.0000 mg | ORAL_TABLET | Freq: Four times a day (QID) | ORAL | Status: DC | PRN

## 2013-02-16 MED ORDER — METOPROLOL SUCCINATE ER 50 MG PO TB24
50.0000 mg | ORAL_TABLET | Freq: Every day | ORAL | Status: DC
  Administered 2013-02-16: 50 mg via ORAL
  Filled 2013-02-16: qty 1

## 2013-02-16 MED ORDER — ASPIRIN EC 325 MG PO TBEC
325.0000 mg | DELAYED_RELEASE_TABLET | Freq: Every day | ORAL | Status: DC
Start: 2013-02-16 — End: 2013-02-16
  Administered 2013-02-16 (×2): 325 mg via ORAL
  Filled 2013-02-16: qty 1

## 2013-02-16 MED ORDER — REGADENOSON 0.4 MG/5ML IV SOLN
INTRAVENOUS | Status: AC
  Administered 2013-02-16: 0.4 mg via INTRAVENOUS
  Filled 2013-02-16: qty 5

## 2013-02-16 MED ORDER — REGADENOSON 0.4 MG/5ML IV SOLN
0.4000 mg | Freq: Once | INTRAVENOUS | Status: AC
  Administered 2013-02-16: 0.4 mg via INTRAVENOUS
  Filled 2013-02-16: qty 5

## 2013-02-16 MED ORDER — ACETAMINOPHEN 650 MG RE SUPP: 650.0000 mg | Freq: Four times a day (QID) | RECTAL | Status: DC | PRN

## 2013-02-16 MED ORDER — GEMFIBROZIL 600 MG PO TABS
600.0000 mg | ORAL_TABLET | Freq: Two times a day (BID) | ORAL | Status: DC
  Administered 2013-02-16: 600 mg via ORAL
  Filled 2013-02-16 (×2): qty 1

## 2013-02-16 MED ORDER — TECHNETIUM TC 99M SESTAMIBI - CARDIOLITE
30.0000 | Freq: Once | INTRAVENOUS | Status: AC | PRN
  Administered 2013-02-16: 30 via INTRAVENOUS

## 2013-02-16 MED ORDER — BUTALBITAL-APAP-CAFFEINE 50-325-40 MG PO TABS: 1.0000 | ORAL_TABLET | Freq: Four times a day (QID) | ORAL | Status: DC | PRN

## 2013-02-16 NOTE — Progress Notes (Signed)
[facility] hospitalist progress note. Chief complaint. Transfer note. History of present illness. This 63 year old male presented to [facility] emergency room with complaints of chest pain for 2 days. Patient was admitted with both typical and atypical features of chest pain but does have a prior history of coronary artery disease. The case was discussed with Doctor Radford Pax cardiology requested patient be transferred to [facility] for cardiology evaluation. Patient has now arrived and I am seeing him to ensure he remained stable post transfer and that his orders transferred appropriately. Patient currently denies any chest pain. Vital signs. Temperature 97.4, pulse 71, respiration 18, blood pressure 120/67. O2 sats 94%. General appearance. Well-developed elderly male who is alert, cooperative, and in no distress. Cardiac. Rate and rhythm regular. Lungs. Breath sounds clear and equal. Abdomen. Soft and obese with positive bowel sounds. No pain with palpation. Impression/plan. Problem #1 chest pain. Cardiac enzymes will be cycled. Patient continues on aspirin, statin, and beta blockers. We'll await further recommendations from cardiology. Patient is clinically stable at the time of this note and all orders appear to of transferred appropriately.

## 2013-02-16 NOTE — Discharge Summary (Signed)
Physician Discharge Summary  [name] ZOX:[id number] DOB: [birth year] DOA: 02/15/2013  PCP: Gregory Herring., MD  Admit date: 02/15/2013 Discharge date: 02/16/2013  Time spent: 45 minutes  Recommendations for Outpatient Follow-up:  -Will be discharged home today. -Advised to follow up with his PCP in 2 weeks.    Discharge Diagnoses:  Principal Problem:   Chest pain Active Problems:   HTN (hypertension)   CAD (coronary artery disease)   Dyslipidemia   Discharge Condition: Stable and improved  Filed Weights   02/15/13 2137 02/16/13 0120  Weight: 102.059 kg (225 lb) 105.416 kg (232 lb 6.4 oz)    History of present illness:  [name] is a 63 y.o. male with a Past Medical History of CAD status post PTCI in the 1990s, attention and dyslipidemia who presents today with chest pain. Patient started having chest pain yesterday evening, chest pain is located in the upper chest and lower neck, and is mostly in the midline. Pain is approximately 6-7/10 at its worse, radiating to both his arms, associated with nausea, facial flushing and some mild diaphoresis. There is no associated shortness of breath. Patient has had 2 episodes of chest pain, one last night and one this evening. He was brought to the emergency room, I was asked to admit this patient for further evaluation and treatment.  During my evaluation, patient was not had any chest pain, he appeared comfortable.  Patient also complains of mild headaches for which he usually takes Fiorocet.  Patient denies any abdominal pain, vomiting, diarrhea or fever.   Hospital Course:  Chest Pain -Was seen in consultation by cardiology. -Given his history of CAD it was decided to proceed with a stress myoview, that was negative for signs of inducible ischemia. -EF was 47% (lower than his last ECHO if 55-60%). This was discussed with [name], who does not believe further workup is necessary at this point.  HTN -Well  controlled. -Continue home medications.  Hyperlipidemia -Continue home medications.  Procedures: Stress myoview:  IMPRESSION:  1. No convincing inducible myocardial ischemia.  2. Limited evaluation of the inferior wall due to movement and  overlap of the diaphragm and activity within the left lobe of the  liver.  3. Calculated ejection fraction of 47%.   Consultations:  Cardiology, [name]  Discharge Instructions      Discharge Orders   Future Appointments Provider Department Dept Phone   03/28/2013 1:30 PM Troy Sine, MD [facility] [phone]   Future Orders Complete By Expires   Diet - low sodium heart healthy  As directed    Discontinue IV  As directed    Increase activity slowly  As directed        Medication List         ALPRAZolam 0.5 MG tablet  Commonly known as:  XANAX  Take 0.5 mg by mouth 4 (four) times daily as needed for anxiety or sleep.     amLODipine 2.5 MG tablet  Commonly known as:  NORVASC  Take 2.5 mg by mouth daily.     aspirin 81 MG tablet  Take 162 mg by mouth every morning.     citalopram 10 MG tablet  Commonly known as:  CELEXA  Take 10 mg by mouth daily.     desoximetasone 0.25 % cream  Commonly known as:  TOPICORT  Apply 1 application topically daily as needed. For rash/irritation     ezetimibe 10 MG tablet  Commonly known as:  ZETIA  Take 10 mg by mouth daily.     gemfibrozil 600 MG tablet  Commonly known as:  LOPID  Take 600 mg by mouth 2 (two) times daily.     metoprolol succinate 50 MG 24 hr tablet  Commonly known as:  TOPROL-XL  Take 50 mg by mouth daily. Take with or immediately following a meal.     mupirocin cream 2 %  Commonly known as:  BACTROBAN  Apply topically 3 (three) times daily.     nitroGLYCERIN 0.4 MG SL tablet  Commonly known as:  NITROSTAT  Place 0.4 mg under the tongue every 5 (five) minutes as needed for chest pain.     ramipril 10 MG capsule  Commonly known as:  ALTACE   Take 10 mg by mouth daily.       Allergies  Allergen Reactions  . Niacin And Related Other (See Comments)    flush  . Penicillins    Follow-up Information   Follow up with Gregory Herring., MD. Schedule an appointment as soon as possible for a visit in 2 weeks.   Specialty:  Internal Medicine   Contact information:   [address] [place] [address] [phone]        The results of significant diagnostics from this hospitalization (including imaging, microbiology, ancillary and laboratory) are listed below for reference.    Significant Diagnostic Studies: Dg Chest 2 View  02/15/2013   CLINICAL DATA:  Shortness of breath and nausea  EXAM: CHEST  2 VIEW  COMPARISON:  09/08/2009  FINDINGS: Cardiac shadow is within normal limits. The lungs are well aerated bilaterally. Few scattered calcified granulomas are again seen. No focal infiltrate or sizable effusion is noted. No bony abnormality is seen.  IMPRESSION: No acute abnormality noted   Electronically Signed   By: Inez Catalina M.D.   On: 02/15/2013 21:56   Nm Myocar Multi W/spect W/wall Motion / Ef  02/16/2013   CLINICAL DATA:  Chest pain and history of coronary artery disease with prior myocardial infarction and coronary angioplasty.  EXAM: MYOCARDIAL IMAGING WITH SPECT (REST AND PHARMACOLOGIC-STRESS)  GATED LEFT VENTRICULAR WALL MOTION STUDY  LEFT VENTRICULAR EJECTION FRACTION  TECHNIQUE: Standard myocardial SPECT imaging was performed after resting intravenous injection of 10 mCi Tc-63m sestamibi. Subsequently, intravenous infusion of Lexiscan was performed under the supervision of the Cardiology staff. At peak effect of the drug, 30 mCi Tc-51m sestamibi was injected intravenously and standard myocardial SPECT imaging was performed. Quantitative gated imaging was also performed to evaluate left ventricular wall motion, and estimate left ventricular ejection fraction.  COMPARISON:  None.  FINDINGS: Utilizing  gated data, the end-diastolic volume is estimated to be 95 mL and the end systolic volume 50 mL. Calculated ejection fraction is 47%.  Wall motion analysis shows mild inferior wall hypokinesis towards the base of the heart.  SPECT imaging shows no convincing inducible myocardial ischemia. Evaluation of the inferior wall was limited due to patient movement and partial obscuration of the inferior wall by the diaphragm and left lobe of the liver.  IMPRESSION: 1. No convincing inducible myocardial ischemia. 2. Limited evaluation of the inferior wall due to movement and overlap of the diaphragm and activity within the left lobe of the liver. 3. Calculated ejection fraction of 47%.   Electronically Signed   By: Aletta Edouard M.D.   On: 02/16/2013 13:29    Microbiology: Recent Results (from the past 240 hour(s))  MRSA PCR SCREENING     Status:  None   Collection Time    02/16/13  1:47 AM      Result Value Range Status   MRSA by PCR NEGATIVE  NEGATIVE Final   Comment:            The GeneXpert MRSA Assay (FDA     approved for NASAL specimens     only), is one component of a     comprehensive MRSA colonization     surveillance program. It is not     intended to diagnose MRSA     infection nor to guide or     monitor treatment for     MRSA infections.     Labs: Basic Metabolic Panel:  Recent Labs Lab 02/15/13 2140 02/16/13 0341  NA 140  --   K 3.2*  --   CL 99  --   CO2 27  --   GLUCOSE 159*  --   BUN 12  --   CREATININE 1.02 0.93  CALCIUM 9.7  --    Liver Function Tests:  Recent Labs Lab 02/15/13 2140  AST 20  ALT 18  ALKPHOS 114  BILITOT 0.2*  PROT 8.2  ALBUMIN 4.0   No results found for this basename: LIPASE, AMYLASE,  in the last 168 hours No results found for this basename: AMMONIA,  in the last 168 hours CBC:  Recent Labs Lab 02/15/13 2140 02/16/13 0341  WBC 7.3 7.4  NEUTROABS 3.4  --   HGB 15.1 14.2  HCT 43.1 41.6  MCV 89.4 89.8  PLT 481* 467*   Cardiac  Enzymes:  Recent Labs Lab 02/15/13 2140 02/16/13 0341 02/16/13 0745  CKTOTAL  --  92  --   TROPONINI <0.30 <0.30 <0.30   BNP: BNP (last 3 results) No results found for this basename: PROBNP,  in the last 8760 hours CBG: No results found for this basename: GLUCAP,  in the last 168 hours     Signed:  Lelon Frohlich  [facility] Pager: [phone] 02/16/2013, 2:25 PM

## 2013-02-16 NOTE — Consult Note (Signed)
Cardiology Consult Note   Patient ID: [name] MRN: [id number], DOB/AGE: 04/24/1950   Admit date: 02/15/2013 Date of Consult: 02/16/2013  Primary Physician: [name]., MD Primary Cardiologist: Marella Chimes., MD  Reason for consult: chest pain  HPI: [name] is a 63 y.o. male w/ PMHx s/f CAD, HTN, HLD, OSA, obesity and h/o tobacco abuse who was transferred from [facility] to [facility] today with unstable angina.   He had a MI in 1994 and underwent PTCA alone at that time. Last ischemic evaluation included a cardiac catheterization in 2009 revealing a mid RCA CTO with diffuse L-R collateralization, inferoapical, minimal nonobstructive dz elsewhere. Mid-to-basal HK, EF 55-55%. Echo 09/2012- 55-60%, mild inferolateral HK, mild MR, mild LVH. He is statin and niacin intolerant (c/w RCA CTO).  He had been in his USOH. His largest complaint over the past month has been bilateral arm soreness, particularly when he lays down, non-exertional. Last night, he ate some deer meat. Later in the evening, he developed precordial chest "soreness." He did have some neck discomfort during this time. No belching or water brash. He laid down and developed nausea, flushing and diaphoresis. The discomfort lasted 15-20 minutes, and remitted spontaneously w/o NTG (gives him a HA). No strong comparison to his prior MI. Given his cardiac history, and the fact that his wife is a Marine scientist, he presented to the [facility] ED.  There, EKG revealed subtle, nonspecific ST changes. Initial TnI WNL. K 3.2. PLT 481. CXR- no active disease. He was admitted by the medicine service, then transferred to [facility] after discussion with Dr. Radford Pax. A subsequent troponin returned WNL. He is currently cp free.   Problem List: Past Medical History  Diagnosis Date  . Depression   . Myocardial infarct     a. MI in 1994 s/p PTCA alone  . Hypertension   . Sleep apnea   . Nephrolithiasis   . Complication of anesthesia    heart rate dropped twice during surg  . Arthritis   . CAD (coronary artery disease)     a. Mid RCA CTO w/ L-R collateralization, nonobstructive dz elsewhere by 2009 cath  . Obesity   . History of tobacco abuse   . Hyperlipidemia     a. statin, niacin intolerant    Past Surgical History  Procedure Laterality Date  . Knee surgery    . Back surgery    . Wrist surgery      carpal tunnel  . Cystectomy    . Nose surgery    . Pilonidal cyst excision    . Incision and drainage abscess Right 05/23/2012    Procedure: INCISION AND DRAINAGE ABSCESS;  Surgeon: [name], MD;  Location: [facility] ORS;  Service: General;  Laterality: Right;  . Splenectomy    . Cardiac catheterization  1994, 2009    a. PTCA alone in 1994 b. RCA CTO w/ L-R collaterals, 40-50% prox RCA, 20% prox LAD; EF 50-55%, inferoapical HK     Allergies:  Allergies  Allergen Reactions  . Niacin And Related Other (See Comments)    flush  . Penicillins     Home Medications: Prior to Admission medications   Medication Sig Start Date End Date Taking? Authorizing Provider  ALPRAZolam Duanne Moron) 0.5 MG tablet Take 0.5 mg by mouth 4 (four) times daily as needed for anxiety or sleep.   Yes Historical Provider, MD  amLODipine (NORVASC) 2.5 MG tablet Take 2.5 mg by mouth daily.   Yes  Historical Provider, MD  aspirin 81 MG tablet Take 162 mg by mouth every morning.    Yes Historical Provider, MD  citalopram (CELEXA) 10 MG tablet Take 10 mg by mouth daily.   Yes Historical Provider, MD  desoximetasone (TOPICORT) 0.25 % cream Apply 1 application topically daily as needed. For rash/irritation 11/10/12  Yes Historical Provider, MD  ezetimibe (ZETIA) 10 MG tablet Take 10 mg by mouth daily.   Yes Historical Provider, MD  gemfibrozil (LOPID) 600 MG tablet Take 600 mg by mouth 2 (two) times daily.   Yes Historical Provider, MD  metoprolol succinate (TOPROL-XL) 50 MG 24 hr tablet Take 50 mg by mouth daily. Take with or immediately following a  meal.   Yes Historical Provider, MD  mupirocin cream (BACTROBAN) 2 % Apply topically 3 (three) times daily. 05/23/12  Yes [name], MD  nitroGLYCERIN (NITROSTAT) 0.4 MG SL tablet Place 0.4 mg under the tongue every 5 (five) minutes as needed for chest pain.   Yes Historical Provider, MD  ramipril (ALTACE) 10 MG capsule Take 10 mg by mouth daily.   Yes Historical Provider, MD    Inpatient Medications:  . amLODipine  2.5 mg Oral Daily  . aspirin EC  325 mg Oral Daily  . citalopram  10 mg Oral Daily  . enoxaparin (LOVENOX) injection  40 mg Subcutaneous Q24H  . ezetimibe  10 mg Oral Daily  . gemfibrozil  600 mg Oral BID  . metoprolol succinate  50 mg Oral Daily  . ramipril  10 mg Oral Daily  . regadenoson  0.4 mg Intravenous Once  . sodium chloride  3 mL Intravenous Q12H   Prescriptions prior to admission  Medication Sig Dispense Refill  . ALPRAZolam (XANAX) 0.5 MG tablet Take 0.5 mg by mouth 4 (four) times daily as needed for anxiety or sleep.      Marland Kitchen amLODipine (NORVASC) 2.5 MG tablet Take 2.5 mg by mouth daily.      Marland Kitchen aspirin 81 MG tablet Take 162 mg by mouth every morning.       . citalopram (CELEXA) 10 MG tablet Take 10 mg by mouth daily.      Marland Kitchen desoximetasone (TOPICORT) 0.25 % cream Apply 1 application topically daily as needed. For rash/irritation      . ezetimibe (ZETIA) 10 MG tablet Take 10 mg by mouth daily.      Marland Kitchen gemfibrozil (LOPID) 600 MG tablet Take 600 mg by mouth 2 (two) times daily.      . metoprolol succinate (TOPROL-XL) 50 MG 24 hr tablet Take 50 mg by mouth daily. Take with or immediately following a meal.      . mupirocin cream (BACTROBAN) 2 % Apply topically 3 (three) times daily.  15 g  2  . nitroGLYCERIN (NITROSTAT) 0.4 MG SL tablet Place 0.4 mg under the tongue every 5 (five) minutes as needed for chest pain.      . ramipril (ALTACE) 10 MG capsule Take 10 mg by mouth daily.        Family History  Problem Relation Age of Onset  . CAD Father     CABG   .  CAD Brother     CABG in 72s  . CAD Brother     CABG in 53s     History   Social History  . Marital Status: Married    Spouse Name: N/A    Number of Children: N/A  . Years of Education: N/A   Occupational History  .  Not on file.   Social History Main Topics  . Smoking status: Former Smoker -- 2.00 packs/day for 28 years    Types: Cigarettes  . Smokeless tobacco: Not on file  . Alcohol Use: No  . Drug Use: No  . Sexual Activity: Yes   Other Topics Concern  . Not on file   Social History Narrative   Lives in [place], [name]. Married, 2 children.      Review of Systems: General: negative for chills, fever, night sweats or weight changes.  Cardiovascular: positive for chest pain, negative for dyspnea on exertion, edema, orthopnea, palpitations, paroxysmal nocturnal dyspnea or shortness of breath Dermatological: negative for rash Respiratory: negative for cough or wheezing Urologic: negative for hematuria Abdominal: positive for nausea, negative for vomiting, diarrhea, bright red blood per rectum, melena, or hematemesis Neurologic: negative for visual changes, syncope, or dizziness All other systems reviewed and are otherwise negative except as noted above.  Physical Exam: Blood pressure 130/77, pulse 62, temperature 97.9 F (36.6 C), temperature source Oral, resp. rate 18, height '5\' 9"'  (1.753 m), weight 105.416 kg (232 lb 6.4 oz), SpO2 96.00%.   General: Well developed, well nourished, in no acute distress. Head: Normocephalic, atraumatic, sclera non-icteric, no xanthomas, nares are without discharge.  Neck: Negative for carotid bruits. JVD not elevated. Lungs: Clear bilaterally to auscultation without wheezes, rales, or rhonchi. Breathing is unlabored. Heart:  RRR with S1 S2. No murmurs, rubs, or gallops appreciated. Abdomen: Soft, non-tender, non-distended with normoactive bowel sounds. No hepatomegaly. No rebound/guarding. No obvious abdominal masses. Msk:   Strength  and tone appears normal for age. Extremities: No clubbing, cyanosis or edema.  Distal pedal pulses are 2+ and equal bilaterally. Neuro: Alert and oriented X 3. Moves all extremities spontaneously. Psych:  Responds to questions appropriately with a normal affect.  Labs: Recent Labs     02/15/13  2140  02/16/13  0341  WBC  7.3  7.4  HGB  15.1  14.2  HCT  43.1  41.6  MCV  89.4  89.8  PLT  481*  467*    Recent Labs Lab 02/15/13 2140 02/16/13 0341  NA 140  --   K 3.2*  --   CL 99  --   CO2 27  --   BUN 12  --   CREATININE 1.02 0.93  CALCIUM 9.7  --   PROT 8.2  --   BILITOT 0.2*  --   ALKPHOS 114  --   ALT 18  --   AST 20  --   GLUCOSE 159*  --    Recent Labs     02/15/13  2140  02/16/13  0341  02/16/13  0745  TROPONINI  <0.30  <0.30  <0.30   Radiology/Studies: Dg Chest 2 View  02/15/2013   CLINICAL DATA:  Shortness of breath and nausea  EXAM: CHEST  2 VIEW  COMPARISON:  09/08/2009  FINDINGS: Cardiac shadow is within normal limits. The lungs are well aerated bilaterally. Few scattered calcified granulomas are again seen. No focal infiltrate or sizable effusion is noted. No bony abnormality is seen.  IMPRESSION: No acute abnormality noted   Electronically Signed   By: Inez Catalina M.D.   On: 02/15/2013 21:56    EKG: NSR, 94 bpm, subtle, nonspecific ST scooping V4-V6, II, III, aVF, Q waves II, III, aVF, V5, V6  ASSESSMENT AND PLAN:   1. Precordial pain 2. CAD s/p PTCA in 1994, known RCA CTO 3. Myalgias 4. Hypertension 5.  Hyperlipidemia 6. H/o tobacco abuse 7. Obesity 8. OSA  The patient reports experiencing an episode of postprandial precordial chest "soreness" lasting 15-20 minutes w/ associated diaphoresis and nausea after laying down. No exertional component. He did have bilateral arm discomfort, but this is described as a soreness, related to position and lasting ~ 1 month. He does have a prior cardiac history. Last ischemic eval- 2009 cath, stable CAD noted  above. Objectively, EKG nonspecific. TnI x 2 WNL. Chest pain free currently. HPI contains both typical and atypical features. Will plan Lexiscan Cardiolite this AM to r/o ischemic cause. Keep reflux and myalgias on differential. Will check CK and ESR, and allow medicine to follow-up on this. He is not on statin or niacin due myalgias. Will check TSH, lipid panel as well. Patient lives in [place], [name], and can follow-up with either Dr. Harl Bowie or Jacinta Shoe as an outpatient. Continue ASA, BB, ACEi, Zetia, fibrate, NTG SL PRN.   Signed, R. Valeria Batman, PA-C 02/16/2013, 9:00 AM   I have seen, examined the patient, and reviewed the above assessment and plan.  Changes to above are made where necessary.  He has longstanding myalgias of unclear etiology.  We will check CK and ESR.  Further workup per primary team.  He does have some chest pain with both typical and atypical features.  I think that myoview is prudent to further risk stratify.  Given prior known occlusion of the RCA, we would not anticipate further CV workup unless he had significant LAD or LCx territory abnormality on myoview.  If myoview is low risk, no further CV workup would be planned and outpatient follow-up could be considered.  Co Sign: Thompson Grayer, MD 02/16/2013 10:31 AM

## 2013-02-16 NOTE — Progress Notes (Signed)
Patient came in for chest pain and has a h/o CAD. Has ruled out by troponins. Has been seen by cardiology with plans for a stress test today. If negative can DC later this afternoon. Will continue to follow.  Domingo Mend, MD [facility] Pager: [phone]

## 2013-03-18 ENCOUNTER — Other Ambulatory Visit: Payer: Self-pay | Admitting: *Deleted

## 2013-03-18 MED ORDER — RAMIPRIL 10 MG PO CAPS: 10.0000 mg | ORAL_CAPSULE | Freq: Every day | ORAL | Status: DC

## 2013-03-28 ENCOUNTER — Ambulatory Visit (INDEPENDENT_AMBULATORY_CARE_PROVIDER_SITE_OTHER): Payer: 59 | Admitting: Cardiovascular Disease

## 2013-03-28 ENCOUNTER — Encounter: Payer: Self-pay | Admitting: Cardiovascular Disease

## 2013-03-28 VITALS — BP 142/70 | HR 66 | Ht 69.0 in | Wt 224.7 lb

## 2013-03-28 DIAGNOSIS — R079 Chest pain, unspecified: Secondary | ICD-10-CM

## 2013-03-28 DIAGNOSIS — R0683 Snoring: Secondary | ICD-10-CM

## 2013-03-28 DIAGNOSIS — R0609 Other forms of dyspnea: Secondary | ICD-10-CM

## 2013-03-28 DIAGNOSIS — E785 Hyperlipidemia, unspecified: Secondary | ICD-10-CM

## 2013-03-28 DIAGNOSIS — R0989 Other specified symptoms and signs involving the circulatory and respiratory systems: Secondary | ICD-10-CM

## 2013-03-28 DIAGNOSIS — I2510 Atherosclerotic heart disease of native coronary artery without angina pectoris: Secondary | ICD-10-CM

## 2013-03-28 DIAGNOSIS — I10 Essential (primary) hypertension: Secondary | ICD-10-CM

## 2013-03-28 DIAGNOSIS — G4733 Obstructive sleep apnea (adult) (pediatric): Secondary | ICD-10-CM

## 2013-03-28 MED ORDER — AMLODIPINE BESYLATE 5 MG PO TABS: 5.0000 mg | ORAL_TABLET | Freq: Every day | ORAL | Status: AC

## 2013-03-28 MED ORDER — PITAVASTATIN CALCIUM 2 MG PO TABS: ORAL_TABLET | ORAL | Status: DC

## 2013-03-28 NOTE — Patient Instructions (Signed)
Your physician has recommended you make the following change in your medication: increase the amlodipine to 5 mg daily.( #2 of the 2.5 mg tabs).  Start the livalo as directed. This has been sent to the local [facility] in [place]. Get some over the counter CoQ 10 300mg . Stop gemfibrozil.  Your physician has recommended that you have a sleep study. This test records several body functions during sleep, including: brain activity, eye movement, oxygen and carbon dioxide blood levels, heart rate and rhythm, breathing rate and rhythm, the flow of air through your mouth and nose, snoring, body muscle movements, and chest and belly movement.  Your physician recommends that you schedule a follow-up appointment in: 2 months.

## 2013-03-30 ENCOUNTER — Encounter: Payer: Self-pay | Admitting: Cardiovascular Disease

## 2013-03-30 DIAGNOSIS — G4733 Obstructive sleep apnea (adult) (pediatric): Secondary | ICD-10-CM | POA: Insufficient documentation

## 2013-03-30 NOTE — Progress Notes (Signed)
Patient ID: [name], male   DOB: 1950/09/04, 63 y.o.   MRN: [id number]     HPI: [name] is a 63 y.o. male who is a former patient of Dr. Rollene Fare and last saw Dr. Rollene Fare in July 2014. He presents to the office today to establish cardiology care with me.  [name] suffered an inferior wall MI 1995 and catheterization revealed RCA occlusion with left-to-right collaterals. He was treated initially with PTCA of his RCA. His last cardiac catheterization was in 2009 which showed an RCA occlusion with collaterals with no significant LAD disease, although there was mild 20% diagonal stenosis. He also is a history of hypertension, as well as obesity. An echo Doppler study done in August 2014 by Dr. Rollene Fare showed mild LVH with mild hypokinesis of the inferolateral wall but with preserved global systolic function with an EF of 55-60%. He had normal diastolic parameters.  Apparently, he was recently hospital in January 2015 and was admitted by the hospitalist service with chest pain. He did undergo a stress Myoview study which did not demonstrate convincing inducible myocardial ischemia. There was limited evaluation of the inferior wall due to movement and overlap of the diaphragm and activity within the left lobe of the liver. Ejection fraction was 47%. Apparently he was seen by Dr. Rayann Heman for cardiology consultation who recommended no further cardiac workup. He presents to the office for a followup of that hospitalization.  Of note, one month ago total closure was 163 triglycerides 100 HDL 31 LDL 112 in the past, he had some difficulty taking statins.  Additional problems include arthritis, hypertension, he is a remote history of right buttock abscess leading to hospitalization in December 2013.  Past Medical History  Diagnosis Date  . Depression   . Myocardial infarct     a. MI in 1994 s/p PTCA alone  . Hypertension   . Sleep apnea   . Nephrolithiasis   . Complication of anesthesia     heart rate dropped twice during surg  . Arthritis   . CAD (coronary artery disease)     a. Mid RCA CTO w/ L-R collateralization, nonobstructive dz elsewhere by 2009 cath  . Obesity   . History of tobacco abuse   . Hyperlipidemia     a. statin, niacin intolerant    Past Surgical History  Procedure Laterality Date  . Knee surgery    . Back surgery    . Wrist surgery      carpal tunnel  . Cystectomy    . Nose surgery    . Pilonidal cyst excision    . Incision and drainage abscess Right 05/23/2012    Procedure: INCISION AND DRAINAGE ABSCESS;  Surgeon: [name], MD;  Location: [facility];  Service: General;  Laterality: Right;  . Splenectomy    . Cardiac catheterization  1994, 2009    a. PTCA alone in 1994 b. RCA CTO w/ L-R collaterals, 40-50% prox RCA, 20% prox LAD; EF 50-55%, inferoapical HK    Allergies  Allergen Reactions  . Niacin And Related Other (See Comments)    flush  . Penicillins   . Statins     myalgias    Current Outpatient Prescriptions  Medication Sig Dispense Refill  . ALPRAZolam (XANAX) 0.5 MG tablet Take 0.5 mg by mouth 4 (four) times daily as needed for anxiety or sleep.      Marland Kitchen aspirin 81 MG tablet Take 162 mg by mouth every morning.       Marland Kitchen  Cholecalciferol (VITAMIN D-3) 1000 UNITS CAPS Take 2,000 Units by mouth daily.      . citalopram (CELEXA) 10 MG tablet Take 10 mg by mouth daily.      Marland Kitchen desoximetasone (TOPICORT) 0.25 % cream Apply 1 application topically daily as needed. For rash/irritation      . ezetimibe (ZETIA) 10 MG tablet Take 10 mg by mouth daily.      Marland Kitchen gemfibrozil (LOPID) 600 MG tablet Take 600 mg by mouth 2 (two) times daily.      . metoprolol succinate (TOPROL-XL) 50 MG 24 hr tablet Take 50 mg by mouth daily. Take with or immediately following a meal.      . mupirocin cream (BACTROBAN) 2 % Apply topically 3 (three) times daily.  15 g  2  . nitroGLYCERIN (NITROSTAT) 0.4 MG SL tablet Place 0.4 mg under the tongue every 5 (five) minutes as  needed for chest pain.      . ramipril (ALTACE) 10 MG capsule Take 1 capsule (10 mg total) by mouth daily.  90 capsule  1  . amLODipine (NORVASC) 5 MG tablet Take 1 tablet (5 mg total) by mouth daily.  180 tablet  3  . Pitavastatin Calcium (LIVALO) 2 MG TABS Take 1/2 tablet every 3rd day initially. If tolerated then take 1 tablet daily.  30 tablet  6   No current facility-administered medications for this visit.    History   Social History  . Marital Status: Married    Spouse Name: N/A    Number of Children: N/A  . Years of Education: N/A   Occupational History  . Not on file.   Social History Main Topics  . Smoking status: Former Smoker -- 2.00 packs/day for 28 years    Types: Cigarettes    Quit date: 03/28/1990  . Smokeless tobacco: Former Systems developer    Types: Chew  . Alcohol Use: No  . Drug Use: No  . Sexual Activity: Yes   Other Topics Concern  . Not on file   Social History Narrative   Lives in [place], [name]. Married, 2 children.     Family History  Problem Relation Age of Onset  . CAD Father     CABG   . CAD Brother     CABG in 81s  . CAD Brother   . Arthritis/Rheumatoid Mother   . Hyperlipidemia Mother   . Thrombocytopenia Mother   . COPD Son     smoker  . Alzheimer's disease Maternal Grandmother   . Lung disease Paternal Grandfather     ROS is negative for fevers, chills or night sweats. He is change in vision or hearing. He is unaware of lymphadenopathy. He denies wheezing. There is no increased sputum production. He denies chest pain. He denies presyncope or syncope. He denies abdominal pain. Denies nausea or vomiting. He is unaware of blood in stool or urine. He denies claudication. He is unaware of diabetes or thyroid issues. He was told of having sleep apnea several years ago but is on CPAP therapy. Previously had a deviated septum. He also status post splenectomy in 1960s.  Other comprehensive 14 point system review is negative.  PE BP 142/70  Pulse 66   Ht 5\' 9"  (1.753 m)  Wt 224 lb 11.2 oz (101.923 kg)  BMI 33.17 kg/m2  General: Alert, oriented, no distress.  Skin: normal turgor, no rashes HEENT: Normocephalic, atraumatic. Pupils round and reactive; sclera anicteric;no lid lag. Extraocular muscles intact;; no xanthelasmas. Nose without nasal  septal hypertrophy Mouth/Parynx benign; Mallinpatti scale 3 Neck: No JVD, no carotid bruits; normal carotid upstroke Lungs: clear to ausculatation and percussion; no wheezing or rales Chest wall: no tenderness to palpitation; pectus excavatum chest wall deformity Heart: RRR, s1 s2 normal; 1/6 systolic murmur;no diastolic murmur, rub thrills or heaves Abdomen: soft, nontender; no hepatosplenomehaly, BS+; abdominal aorta nontender and not dilated by palpation. Back: no CVA tenderness Pulses 2+ Extremities: no clubbing cyanosis or edema, Homan's sign negative  Neurologic: grossly nonfocal; cranial nerves grossly normal. Psychologic: normal affect and mood.  ECG (independently read by me): Sinus rhythm at 66 beats per minute. No ectopy. Q-wave in lead 3   LABS:  BMET    Component Value Date/Time   NA 140 02/15/2013 2140   K 3.2* 02/15/2013 2140   CL 99 02/15/2013 2140   CO2 27 02/15/2013 2140   GLUCOSE 159* 02/15/2013 2140   BUN 12 02/15/2013 2140   CREATININE 0.93 02/16/2013 0341   CREATININE 0.82 09/12/2012 0745   CALCIUM 9.7 02/15/2013 2140   GFRNONAA 88* 02/16/2013 0341   GFRAA >90 02/16/2013 0341     Hepatic Function Panel     Component Value Date/Time   PROT 8.2 02/15/2013 2140   ALBUMIN 4.0 02/15/2013 2140   AST 20 02/15/2013 2140   ALT 18 02/15/2013 2140   ALKPHOS 114 02/15/2013 2140   BILITOT 0.2* 02/15/2013 2140     CBC    Component Value Date/Time   WBC 7.4 02/16/2013 0341   RBC 4.63 02/16/2013 0341   HGB 14.2 02/16/2013 0341   HCT 41.6 02/16/2013 0341   PLT 467* 02/16/2013 0341   MCV 89.8 02/16/2013 0341   MCH 30.7 02/16/2013 0341   MCHC 34.1 02/16/2013 0341   RDW 13.9 02/16/2013 0341    LYMPHSABS 2.5 02/15/2013 2140   MONOABS 1.1* 02/15/2013 2140   EOSABS 0.3 02/15/2013 2140   BASOSABS 0.0 02/15/2013 2140     BNP No results found for this basename: probnp    Lipid Panel     Component Value Date/Time   CHOL 163 02/16/2013 0340   TRIG 100 02/16/2013 0340   HDL 31* 02/16/2013 0340   CHOLHDL 5.3 02/16/2013 0340   VLDL 20 02/16/2013 0340   LDLCALC 112* 02/16/2013 0340     RADIOLOGY: No results found.    ASSESSMENT AND PLAN:  [name] is a 63 year old gentleman who suffered an inferior wall myocardial infarction 20 years ago  has been found to have total RCA occlusion with good left to right collaterals without significant contralateral disease. His most recent nuclear perfusion study did not demonstrate significant ischemia. However, ejection fraction was 47%. His echo Doppler study did show inferolateral wall motion abnormality. I did review his recent laboratory. I have recommended he discontinue his gemfibrozil. I have recommended a trial of Livalo to see if he's able to tolerate this and I have given him a prescription for 1 mg initially and to start taking this every third day. He will also take coenzyme Q 10. I am further titrating his amlodipine to 5 mg daily for optimal blood pressure control. He is 5 years since his deviated septal surgery. I have recommended he followup his obstructive sleep apnea with a sleep study particularly with the availability of new technology. I will see him in the office in 2 months followup evaluation.    Troy Sine, MD, [facility]  03/30/2013 7:33 PM

## 2013-04-03 ENCOUNTER — Other Ambulatory Visit: Payer: Self-pay | Admitting: Internal Medicine

## 2013-04-03 DIAGNOSIS — E041 Nontoxic single thyroid nodule: Secondary | ICD-10-CM

## 2013-04-09 ENCOUNTER — Ambulatory Visit
Admission: RE | Admit: 2013-04-09 | Discharge: 2013-04-09 | Disposition: A | Discharge status: Home / Self Care | Payer: 59 | Source: Ambulatory Visit | Attending: Internal Medicine | Admitting: Internal Medicine

## 2013-04-09 DIAGNOSIS — E041 Nontoxic single thyroid nodule: Secondary | ICD-10-CM

## 2013-05-30 ENCOUNTER — Encounter: Payer: Self-pay | Admitting: Cardiovascular Disease

## 2013-05-30 ENCOUNTER — Ambulatory Visit (INDEPENDENT_AMBULATORY_CARE_PROVIDER_SITE_OTHER): Payer: 59 | Admitting: Cardiovascular Disease

## 2013-05-30 VITALS — BP 116/70 | HR 61 | Ht 69.0 in | Wt 220.7 lb

## 2013-05-30 DIAGNOSIS — I2510 Atherosclerotic heart disease of native coronary artery without angina pectoris: Secondary | ICD-10-CM

## 2013-05-30 DIAGNOSIS — E785 Hyperlipidemia, unspecified: Secondary | ICD-10-CM

## 2013-05-30 DIAGNOSIS — G4733 Obstructive sleep apnea (adult) (pediatric): Secondary | ICD-10-CM

## 2013-05-30 DIAGNOSIS — I10 Essential (primary) hypertension: Secondary | ICD-10-CM

## 2013-05-30 NOTE — Patient Instructions (Signed)
Continue your current medication therapy. Dr Claiborne Billings will see you back in 2 months for the [facility] (with [facility]).

## 2013-06-02 ENCOUNTER — Encounter: Payer: Self-pay | Admitting: Cardiovascular Disease

## 2013-06-02 NOTE — Progress Notes (Signed)
Patient ID: [name], male   DOB: 07/09/1950, 63 y.o.   MRN: [id number]     HPI: [name] is a 63 y.o. male who is a former patient of Dr. Rollene Fare and last saw Dr. Rollene Fare in July 2014. He establish cardiology care with me 2 months ago and presents for followup evaluation.  [name] suffered an inferior wall MI 1995 and catheterization revealed RCA occlusion with left-to-right collaterals. He was treated initially with PTCA of his RCA. His last cardiac catheterization was in 2009 which showed an RCA occlusion with collaterals with no significant LAD disease, although there was mild 20% diagonal stenosis. He also is a history of hypertension, as well as obesity. An echo Doppler study done in August 2014 by Dr. Rollene Fare showed mild LVH with mild hypokinesis of the inferolateral wall but with preserved global systolic function with an EF of 55-60%. He had normal diastolic parameters.  In January 2015 he was admitted by the hospitalist service with chest pain. A stress Myoview study did not demonstrate convincing inducible myocardial ischemia. There was limited evaluation of the inferior wall due to movement and overlap of the diaphragm and activity within the left lobe of the liver. Ejection fraction was 47%. He was seen by Dr. Rayann Heman for cardiology consultation who recommended no further cardiac workup.  Of note, one month ago total closure was 163 triglycerides 100 HDL 31 LDL 112 in the past, he had some difficulty taking statins.  Additional problems include arthritis, hypertension, he is a remote history of right buttock abscess leading to hospitalization in December 2013.  When I saw him 2 months ago, I discontinued his gemfibrozil and recommended a trial of Livalo to take and hopefully tolerate  along with coenzyme Q10.  I titrated his amlodipine to 5 mg daily for more optimal blood pressure control.  I was also very concerned about obstructive sleep apnea and referred him for a sleep  study.  He underwent a split-night protocol, on 05/10/2012.  This revealed severe sleep apnea with an AHI of 75.6 per hour.  During REM sleep this was 40 per hour.  He had significant oxygen desaturation to 78% with non-REM sleep and 76% with REM sleep.  He also had a significant positional component to his sleep apnea.  He was started on CPAP titration but due to high-pressure he was was switched to a BiPAP mode.  Ultimately, a BiPAP auto unit was recommended with an EPAP minimum of 10 and a IPAP maximum up to 25.  He also did have periodic lid movements with sleep with an index of 18.  He presents for evaluation.    Past Medical History  Diagnosis Date  . Depression   . Myocardial infarct     a. MI in 1994 s/p PTCA alone  . Hypertension   . Sleep apnea   . Nephrolithiasis   . Complication of anesthesia     heart rate dropped twice during surg  . Arthritis   . CAD (coronary artery disease)     a. Mid RCA CTO w/ L-R collateralization, nonobstructive dz elsewhere by 2009 cath  . Obesity   . History of tobacco abuse   . Hyperlipidemia     a. statin, niacin intolerant    Past Surgical History  Procedure Laterality Date  . Knee surgery    . Back surgery    . Wrist surgery      carpal tunnel  . Cystectomy    . Nose surgery    .  Pilonidal cyst excision    . Incision and drainage abscess Right 05/23/2012    Procedure: INCISION AND DRAINAGE ABSCESS;  Surgeon: [name], MD;  Location: [facility];  Service: General;  Laterality: Right;  . Splenectomy    . Cardiac catheterization  1994, 2009    a. PTCA alone in 1994 b. RCA CTO w/ L-R collaterals, 40-50% prox RCA, 20% prox LAD; EF 50-55%, inferoapical HK  . Lower extremity arterial doppler  08/02/2007    Bilateral ABIs-no evidence of arterial insufficiency. Bilateral PVRs demonstrate normal waveforms.  . Cardiac catheterization  04/02/2001    Continue medical therapy.  . Cardiac catheterization  11/07/2000    Continue medical therapy.  .  Cardiac catheterization  09/24/1996    Continue medical therapy  . Cardiovascular stress test  04/06/2010    No scintigraphic evidence of inducible myocardial ischemia. No ECG changes. EKG negative for ischemia.  . Transthoracic echocardiogram  03/05/2009    EF >55%, normal LV wall thickness.    Allergies  Allergen Reactions  . Niacin And Related Other (See Comments)    flush  . Penicillins   . Statins     myalgias    Current Outpatient Prescriptions  Medication Sig Dispense Refill  . ALPRAZolam (XANAX) 0.5 MG tablet Take 0.5 mg by mouth 4 (four) times daily as needed for anxiety or sleep.      Marland Kitchen amLODipine (NORVASC) 5 MG tablet Take 1 tablet (5 mg total) by mouth daily.  180 tablet  3  . aspirin 81 MG tablet Take 162 mg by mouth every morning.       . Cholecalciferol (VITAMIN D-3) 1000 UNITS CAPS Take 2,000 Units by mouth daily.      . citalopram (CELEXA) 10 MG tablet Take 10 mg by mouth daily.      . Coenzyme Q10 (COQ10) 200 MG CAPS Take 1 capsule by mouth daily.      Marland Kitchen desoximetasone (TOPICORT) 0.25 % cream Apply 1 application topically daily as needed. For rash/irritation      . ezetimibe (ZETIA) 10 MG tablet Take 10 mg by mouth daily.      . metoprolol succinate (TOPROL-XL) 50 MG 24 hr tablet Take 50 mg by mouth daily. Take with or immediately following a meal.      . mupirocin cream (BACTROBAN) 2 % Apply topically 3 (three) times daily.  15 g  2  . nitroGLYCERIN (NITROSTAT) 0.4 MG SL tablet Place 0.4 mg under the tongue every 5 (five) minutes as needed for chest pain.      . Pitavastatin Calcium 2 MG TABS take 1 tablet daily.      . ramipril (ALTACE) 10 MG capsule Take 1 capsule (10 mg total) by mouth daily.  90 capsule  1  . silodosin (RAPAFLO) 8 MG CAPS capsule Take 8 mg by mouth daily with breakfast.       No current facility-administered medications for this visit.    History   Social History  . Marital Status: Married    Spouse Name: N/A    Number of Children: N/A    . Years of Education: N/A   Occupational History  . Not on file.   Social History Main Topics  . Smoking status: Former Smoker -- 2.00 packs/day for 28 years    Types: Cigarettes    Quit date: 03/28/1990  . Smokeless tobacco: Former Systems developer    Types: Chew  . Alcohol Use: No  . Drug Use: No  .  Sexual Activity: Yes   Other Topics Concern  . Not on file   Social History Narrative   Lives in [place], [name]. Married, 2 children.     Family History  Problem Relation Age of Onset  . CAD Father     CABG   . CAD Brother     CABG in 40s  . CAD Brother   . Arthritis/Rheumatoid Mother   . Hyperlipidemia Mother   . Thrombocytopenia Mother   . COPD Son     smoker  . Alzheimer's disease Maternal Grandmother   . Lung disease Paternal Grandfather    ROS General: Negative; No fevers, chills, or night sweats HEENT: Negative; No changes in vision or hearing, sinus congestion, difficulty swallowing Pulmonary: Negative; No cough, wheezing, shortness of breath, hemoptysis Cardiovascular: Negative; No chest pain, presyncope, syncope, palpatations GI: Negative; No nausea, vomiting, diarrhea, or abdominal pain GU: Negative; No dysuria, hematuria, or difficulty voiding Musculoskeletal: Negative; no myalgias, joint pain, or weakness Hematologic: Negative; no easy bruising, bleeding Endocrine: Negative; no heat/cold intolerance Neuro: Negative; no changes in balance, headaches Skin: Negative; No rashes or skin lesions Psychiatric: Negative; No behavioral problems, depression Sleep: positive for  daytime sleepiness, hypersomnolence, and snoring, no bruxism, he admits to limb movement, but denies painful restless legs, no hypnogognic hallucinations, no cataplexy   PE BP 116/70  Pulse 61  Ht 5\' 9"  (1.753 m)  Wt 220 lb 11.2 oz (100.109 kg)  BMI 32.58 kg/m2  General: Alert, oriented, no distress.  Skin: normal turgor, no rashes HEENT: Normocephalic, atraumatic. Pupils round and reactive;  sclera anicteric;no lid lag. Extraocular muscles intact;; no xanthelasmas. Nose without nasal septal hypertrophy Mouth/Parynx benign; Mallinpatti scale 3 Neck: No JVD, no carotid bruits; normal carotid upstroke Lungs: clear to ausculatation and percussion; no wheezing or rales Chest wall: no tenderness to palpitation; pectus excavatum chest wall deformity Heart: RRR, s1 s2 normal; 1/6 systolic murmur;no diastolic murmur, rub thrills or heaves Abdomen: soft, nontender; no hepatosplenomehaly, BS+; abdominal aorta nontender and not dilated by palpation. Back: no CVA tenderness Pulses 2+ Extremities: no clubbing cyanosis or edema, Homan's sign negative  Neurologic: grossly nonfocal; cranial nerves grossly normal. Psychologic: normal affect and mood.   ECG proxies independently read by me): Normal sinus rhythm at 61 beats per minute.  Normal intervals  Prior 03/28/2013 ECG (independently read by me): Sinus rhythm at 66 beats per minute. No ectopy. Q-wave in lead 3   LABS:  BMET    Component Value Date/Time   NA 140 02/15/2013 2140   K 3.2* 02/15/2013 2140   CL 99 02/15/2013 2140   CO2 27 02/15/2013 2140   GLUCOSE 159* 02/15/2013 2140   BUN 12 02/15/2013 2140   CREATININE 0.93 02/16/2013 0341   CREATININE 0.82 09/12/2012 0745   CALCIUM 9.7 02/15/2013 2140   GFRNONAA 88* 02/16/2013 0341   GFRAA >90 02/16/2013 0341     Hepatic Function Panel     Component Value Date/Time   PROT 8.2 02/15/2013 2140   ALBUMIN 4.0 02/15/2013 2140   AST 20 02/15/2013 2140   ALT 18 02/15/2013 2140   ALKPHOS 114 02/15/2013 2140   BILITOT 0.2* 02/15/2013 2140     CBC    Component Value Date/Time   WBC 7.4 02/16/2013 0341   RBC 4.63 02/16/2013 0341   HGB 14.2 02/16/2013 0341   HCT 41.6 02/16/2013 0341   PLT 467* 02/16/2013 0341   MCV 89.8 02/16/2013 0341   MCH 30.7 02/16/2013 0341   MCHC 34.1  02/16/2013 0341   RDW 13.9 02/16/2013 0341   LYMPHSABS 2.5 02/15/2013 2140   MONOABS 1.1* 02/15/2013 2140   EOSABS 0.3 02/15/2013 2140    BASOSABS 0.0 02/15/2013 2140     BNP No results found for this basename: probnp    Lipid Panel     Component Value Date/Time   CHOL 163 02/16/2013 0340   TRIG 100 02/16/2013 0340   HDL 31* 02/16/2013 0340   CHOLHDL 5.3 02/16/2013 0340   VLDL 20 02/16/2013 0340   LDLCALC 112* 02/16/2013 0340     RADIOLOGY: No results found.    ASSESSMENT AND PLAN:  [name] is a 63 year old gentleman who suffered an inferior wall myocardial infarction 20 years ago  has been found to have total RCA occlusion with good left to right collaterals without significant contralateral disease. A recent nuclear perfusion study did not demonstrate significant ischemia. However, ejection fraction was 47%. His echo Doppler study did show inferolateral wall motion abnormality.  His blood pressure is better today and he has tolerated the increased dose of amlodipine at 5 mg.  He is able to tolerate Livalo in addition to Zetia for his hyperlipidemia.  I reviewed his sleep study with him.  This demonstrated severe sleep apnea.  He has not yet been contacted by [employer].  He will initiate a BiPAP Auto unit with a EPAP minimal 10, delta of 4  and an IPAP maximum up to 25 if necessary.  At present, he denies any painful urge to move his legs, although he did have increased periodic leg movements noted on this study with an index of 18.  We will continue to monitor this.  I will see him in a sleep clinic in 2 months for followup evaluation and further recommendations were made at that time.   Troy Sine, MD, [facility]  06/02/2013 11:42 AM

## 2013-06-14 ENCOUNTER — Other Ambulatory Visit (HOSPITAL_COMMUNITY): Payer: Self-pay | Admitting: Physician Assistant

## 2013-06-14 ENCOUNTER — Ambulatory Visit (HOSPITAL_COMMUNITY)
Admission: RE | Admit: 2013-06-14 | Discharge: 2013-06-14 | Disposition: A | Discharge status: Home / Self Care | Payer: 59 | Source: Ambulatory Visit | Attending: Physician Assistant | Admitting: Physician Assistant

## 2013-06-14 DIAGNOSIS — R0602 Shortness of breath: Secondary | ICD-10-CM

## 2013-06-24 ENCOUNTER — Telehealth: Payer: Self-pay | Admitting: *Deleted

## 2013-06-24 MED ORDER — ZOLPIDEM TARTRATE 5 MG PO TABS: 5.0000 mg | ORAL_TABLET | Freq: Every evening | ORAL | Status: DC | PRN

## 2013-06-24 NOTE — Telephone Encounter (Signed)
Informed patient zolpidem prescription has been telephoned into his pharmacy.

## 2013-06-24 NOTE — Telephone Encounter (Signed)
Phoned zolpidem 5 mg to pharmacy per V.O. By Dr. Claiborne Billings. Anderson Malta with [facility] came by office and discussed sleep issues with Dr. Claiborne Billings and decision was made to add medication.

## 2013-06-25 ENCOUNTER — Encounter (HOSPITAL_COMMUNITY): Payer: Self-pay | Admitting: Pharmacy Technician

## 2013-06-25 NOTE — Patient Instructions (Addendum)
[name]  06/25/2013   Your procedure is scheduled on:  06/28/2013  Report to [facility] at  800  AM.  Call this number if you have problems the morning of surgery: [phone]   Remember:   Do not eat food or drink liquids after midnight.   Take these medicines the morning of surgery with A SIP OF WATER: Amlodipine, Metoprolol, Ramipril and Citalopram. You may take your Xanax and Percocet if needed. Use your Albuterol inhaler before coming also.   Do not wear jewelry, make-up or nail polish.  Do not wear lotions, powders, or perfumes.   Do not shave 48 hours prior to surgery. Men may shave face and neck.  Do not bring valuables to the hospital.  [facility] is not responsible for any belongings or valuables.               Contacts, dentures or bridgework may not be worn into surgery.  Leave suitcase in the car. After surgery it may be brought to your room.  For patients admitted to the hospital, discharge time is determined by your treatment team.               Patients discharged the day of surgery will not be allowed to drive home.    Special Instructions: Shower using CHG 1 night before surgery and the morning of surgery.  Use special wash - you have one bottle of CHG for both showers.  You should use approximately 1/2 of the bottle for each shower.   Please read over the following fact sheets that you were given: Pain Booklet, Coughing and Deep Breathing, Surgical Site Infection Prevention, Anesthesia Post-op Instructions and Care and Recovery After Surgery   Anal Fistula An anal fistula is an abnormal tunnel that develops between the bowel and skin near the outside of the anus, where feces comes out. The anus has a number of tiny glands that make lubricating fluid. Sometimes these glands can become plugged and infected. This may lead to the development of a fluid-filled pocket (abscess). An anal fistula often develops after this infection or abscess. It is nearly always  caused by a past or current anal abscess.  CAUSES  Though an anal fistula is almost always caused by a past or current anal abscess, other causes can include:  A complication of surgery.  Trauma to the rectal area.  Radiation to the area.  Other medical conditions or diseases, such as:   Chronic inflammatory bowel disease, such as Crohn disease or ulcerative colitis.   Colon or rectal cancer.   Diverticular disease, such as diverticulitis.   A sexually transmitted disease, such as gonorrhea, chlamydia, or syphilis.  An HIV infection or AIDS.  SYMPTOMS   Throbbing or constant pain that may be worse when sitting.   Swelling or irritation around the anus.   Drainage of pus or blood from an opening near the anus.   Pain with bowel movements.  Fever or chills. DIAGNOSIS  Your caregiver will examine the area to find the openings of the anal fistula and the fistula tract. The external opening of the anal fistula may be seen during a physical examination. Other examinations that may be performed include:   Examination of the rectal area with a gloved hand (digital rectal exam).   Examination with a probe or scope to help locate the internal opening of the fistula.   Injection of a dye into the fistula opening. X-rays can  be taken to find the exact location and path of the fistula.   An MRI or ultrasound of the anal area.  Other tests may be performed to find the cause of the anal fistula.   TREATMENT  The most common treatment for an anal fistula is surgery. There are different surgery options depending on where your fistula is located and how complex the fistula is. Surgical options include:  A fistulotomy. This surgery involves opening up the whole fistula and draining the contents inside to promote healing.  Seton placement. A silk string (seton) is placed into the fistula during a fistulotomy to drain any infection to promote healing.  Advancement flap  procedure. Tissue is removed from your rectum or the skin around the anus and is attached to the opening of the fistula.  Bioprosthetic plug. A cone-shaped plug is made from your tissue and is used to block the opening of the fistula. Some anal fistulas do not require surgery. A fibrin glue is a non-surgical option that involves injecting the glue to seal the fistula. You also may be prescribed an antibiotic medicine to treat an infection.  HOME CARE INSTRUCTIONS   Take your antibiotics as directed. Finish them even if you start to feel better.  Only take over-the-counter or prescription medicines as directed by your caregiver.Use a stool softener or laxative, if recommended.   Eat a high-fiber diet to help avoid constipation or as directed by your caregiver.  Drink enough water to keep your urine clear or pale yellow.   A warm sitz bath may be soothing and help with healing. You may take warm sitz baths for 15 20 minutes, 3 4 times a day to ease pain and discomfort.   Follow excellent hygiene to keep the anal area as clean and dry as possible. Use wet toilet paper or moist towelettes after each bowel movement.  SEEK MEDICAL CARE IF: You have increased pain not controlled with medicines.  SEEK IMMEDIATE MEDICAL CARE IF:  You have severe, intolerable pain.  You have new swelling, redness, or discharge around the anal area.  You have tenderness or warmth around the anal area.  You have chills or diarrhea.  You have severe problems urinating or having a bowel movement.   You have a fever or persistent symptoms for more than 2 3 days.   You have a fever and your symptoms suddenly get worse.  MAKE SURE YOU:   Understand these instructions.  Will watch your condition.  Will get help right away if you are not doing well or get worse. Document Released: 01/07/2008 Document Revised: 01/11/2012 Document Reviewed: 11/29/2010 [facility] Patient Information 2014 [place].  PATIENT INSTRUCTIONS POST-ANESTHESIA  IMMEDIATELY FOLLOWING SURGERY:  Do not drive or operate machinery for the first twenty four hours after surgery.  Do not make any important decisions for twenty four hours after surgery or while taking narcotic pain medications or sedatives.  If you develop intractable nausea and vomiting or a severe headache please notify your doctor immediately.  FOLLOW-UP:  Please make an appointment with your surgeon as instructed. You do not need to follow up with anesthesia unless specifically instructed to do so.  WOUND CARE INSTRUCTIONS (if applicable):  Keep a dry clean dressing on the anesthesia/puncture wound site if there is drainage.  Once the wound has quit draining you may leave it open to air.  Generally you should leave the bandage intact for twenty four hours unless there is drainage.  If the epidural  site drains for more than 36-48 hours please call the anesthesia department.  QUESTIONS?:  Please feel free to call your physician or the hospital operator if you have any questions, and they will be happy to assist you.

## 2013-06-26 ENCOUNTER — Encounter (HOSPITAL_COMMUNITY)
Admission: RE | Admit: 2013-06-26 | Discharge: 2013-06-26 | Disposition: A | Discharge status: Home / Self Care | Payer: 59 | Source: Ambulatory Visit | Attending: General Surgery | Admitting: General Surgery

## 2013-06-26 ENCOUNTER — Encounter (HOSPITAL_COMMUNITY): Payer: Self-pay

## 2013-06-26 LAB — BASIC METABOLIC PANEL
BUN: 14 mg/dL (ref 6–23)
CO2: 29 mEq/L (ref 19–32)
Calcium: 9.3 mg/dL (ref 8.4–10.5)
Chloride: 101 mEq/L (ref 96–112)
Creatinine, Ser: 0.92 mg/dL (ref 0.50–1.35)
GFR calc Af Amer: >90 mL/min (ref >90)
GFR calc non Af Amer: 88 mL/min — ABNORMAL LOW (ref >90)
Glucose, Bld: 91 mg/dL (ref 70–99)
Potassium: 4.6 mEq/L (ref 3.7–5.3)
Sodium: 142 mEq/L (ref 137–147)

## 2013-06-26 LAB — CBC WITH DIFFERENTIAL/PLATELET
Basophils Absolute: 0 10*3/uL (ref 0.0–0.1)
Basophils Relative: 0 % (ref 0–1)
Eosinophils Absolute: 0.2 10*3/uL (ref 0.0–0.7)
Eosinophils Relative: 2 % (ref 0–5)
HCT: 42.7 % (ref 39.0–52.0)
Hemoglobin: 14.2 g/dL (ref 13.0–17.0)
Lymphocytes Relative: 18 % (ref 12–46)
Lymphs Abs: 2 10*3/uL (ref 0.7–4.0)
MCH: 29.8 pg (ref 26.0–34.0)
MCHC: 33.3 g/dL (ref 30.0–36.0)
MCV: 89.5 fL (ref 78.0–100.0)
Monocytes Absolute: 1.2 10*3/uL — ABNORMAL HIGH (ref 0.1–1.0)
Monocytes Relative: 11 % (ref 3–12)
Neutro Abs: 7.6 10*3/uL (ref 1.7–7.7)
Neutrophils Relative %: 69 % (ref 43–77)
Platelets: 471 10*3/uL — ABNORMAL HIGH (ref 150–400)
RBC: 4.77 MIL/uL (ref 4.22–5.81)
RDW: 14.1 % (ref 11.5–15.5)
WBC: 11 10*3/uL — ABNORMAL HIGH (ref 4.0–10.5)

## 2013-06-26 LAB — SURGICAL PCR SCREEN
MRSA, PCR: NEGATIVE
Staphylococcus aureus: NEGATIVE

## 2013-06-26 MED ORDER — CHLORHEXIDINE GLUCONATE 4 % EX LIQD: 1.0000 "application " | Freq: Once | CUTANEOUS | Status: DC

## 2013-06-26 NOTE — H&P (Signed)
[name] is an 63 y.o. male.   Chief Complaint: Perianal sinus HPI: Patient is a 63 year old white male status post surgical treatment for perianal fistula in 2014 who presents with recurrent drainage in the perianal region with a sinus present. Despite conservative therapy, he continues to have intermittent drainage. No feculent material is noted. No incontinence is noted.  Past Medical History  Diagnosis Date  . Depression   . Myocardial infarct     a. MI in 1994 s/p PTCA alone  . Hypertension   . Nephrolithiasis   . Complication of anesthesia     heart rate dropped twice during surg  . Arthritis   . CAD (coronary artery disease)     a. Mid RCA CTO w/ L-R collateralization, nonobstructive dz elsewhere by 2009 cath  . Obesity   . History of tobacco abuse   . Hyperlipidemia     a. statin, niacin intolerant  . Sleep apnea     wears TPAP machine at night     Past Surgical History  Procedure Laterality Date  . Knee surgery    . Back surgery    . Wrist surgery      carpal tunnel  . Cystectomy    . Nose surgery    . Pilonidal cyst excision    . Incision and drainage abscess Right 05/23/2012    Procedure: INCISION AND DRAINAGE ABSCESS;  Surgeon: [name], MD;  Location: [facility];  Service: General;  Laterality: Right;  . Splenectomy    . Cardiac catheterization  1994, 2009    a. PTCA alone in 1994 b. RCA CTO w/ L-R collaterals, 40-50% prox RCA, 20% prox LAD; EF 50-55%, inferoapical HK  . Lower extremity arterial doppler  08/02/2007    Bilateral ABIs-no evidence of arterial insufficiency. Bilateral PVRs demonstrate normal waveforms.  . Cardiac catheterization  04/02/2001    Continue medical therapy.  . Cardiac catheterization  11/07/2000    Continue medical therapy.  . Cardiac catheterization  09/24/1996    Continue medical therapy  . Cardiovascular stress test  04/06/2010    No scintigraphic evidence of inducible myocardial ischemia. No ECG changes. EKG negative for  ischemia.  . Transthoracic echocardiogram  03/05/2009    EF >55%, normal LV wall thickness.    Family History  Problem Relation Age of Onset  . CAD Father     CABG   . CAD Brother     CABG in 58s  . CAD Brother   . Arthritis/Rheumatoid Mother   . Hyperlipidemia Mother   . Thrombocytopenia Mother   . COPD Son     smoker  . Alzheimer's disease Maternal Grandmother   . Lung disease Paternal Grandfather    Social History:  reports that he quit smoking about 23 years ago. His smoking use included Cigarettes. He has a 56 pack-year smoking history. He has quit using smokeless tobacco. His smokeless tobacco use included Chew. He reports that he does not drink alcohol or use illicit drugs.  Allergies:  Allergies  Allergen Reactions  . Niacin And Related Other (See Comments)    flush  . Penicillins   . Statins     myalgias    No prescriptions prior to admission    No results found for this or any previous visit (from the past 48 hour(s)). No results found.  Review of Systems  Constitutional: Negative.   HENT: Negative.   Eyes: Negative.   Respiratory: Negative.   Cardiovascular: Negative.   Gastrointestinal:  Perianal pain and irritation  Genitourinary: Negative.   Musculoskeletal: Negative.   Skin: Negative.     There were no vitals taken for this visit. Physical Exam  Constitutional: He appears well-developed and well-nourished.  HENT:  Head: Normocephalic and atraumatic.  Neck: Normal range of motion. Neck supple.  Cardiovascular: Normal rate, regular rhythm and normal heart sounds.   Respiratory: Effort normal and breath sounds normal.  GI: Soft. Bowel sounds are normal. There is no tenderness.  Genitourinary:  Small perianal draining sinus at 2:00 position. Mild induration noted in this region. No feculent drainage noted. Perianal skin irritation noted.     Assessment/Plan Impression: Perianal fistula Plan: Patient will be taken to the operating room  for fistulotomy. The risks and benefits of the procedure including bleeding, infection, and recurrence of the fistula were fully explained to the patient, who gave informed consent.  [name] 06/26/2013, 1:41 PM

## 2013-06-28 ENCOUNTER — Encounter (HOSPITAL_COMMUNITY): Payer: 59 | Admitting: Anesthesiology

## 2013-06-28 ENCOUNTER — Ambulatory Visit (HOSPITAL_COMMUNITY): Payer: 59 | Admitting: Anesthesiology

## 2013-06-28 ENCOUNTER — Encounter (HOSPITAL_COMMUNITY)
Admission: RE | Disposition: A | Discharge status: Home / Self Care | Payer: Self-pay | Source: Ambulatory Visit | Attending: General Surgery

## 2013-06-28 ENCOUNTER — Encounter (HOSPITAL_COMMUNITY): Payer: Self-pay | Admitting: *Deleted

## 2013-06-28 ENCOUNTER — Ambulatory Visit (HOSPITAL_COMMUNITY)
Admission: RE | Admit: 2013-06-28 | Discharge: 2013-06-28 | Disposition: A | Discharge status: Home / Self Care | Payer: 59 | Source: Ambulatory Visit | Attending: General Surgery | Admitting: General Surgery

## 2013-06-28 DIAGNOSIS — K603 Anal fistula, unspecified: Secondary | ICD-10-CM | POA: Insufficient documentation

## 2013-06-28 DIAGNOSIS — E669 Obesity, unspecified: Secondary | ICD-10-CM | POA: Insufficient documentation

## 2013-06-28 DIAGNOSIS — Z87891 Personal history of nicotine dependence: Secondary | ICD-10-CM | POA: Insufficient documentation

## 2013-06-28 DIAGNOSIS — I2510 Atherosclerotic heart disease of native coronary artery without angina pectoris: Secondary | ICD-10-CM | POA: Insufficient documentation

## 2013-06-28 DIAGNOSIS — Z888 Allergy status to other drugs, medicaments and biological substances status: Secondary | ICD-10-CM | POA: Insufficient documentation

## 2013-06-28 DIAGNOSIS — I10 Essential (primary) hypertension: Secondary | ICD-10-CM | POA: Insufficient documentation

## 2013-06-28 DIAGNOSIS — I252 Old myocardial infarction: Secondary | ICD-10-CM | POA: Insufficient documentation

## 2013-06-28 DIAGNOSIS — F329 Major depressive disorder, single episode, unspecified: Secondary | ICD-10-CM | POA: Insufficient documentation

## 2013-06-28 DIAGNOSIS — G4730 Sleep apnea, unspecified: Secondary | ICD-10-CM | POA: Insufficient documentation

## 2013-06-28 DIAGNOSIS — E785 Hyperlipidemia, unspecified: Secondary | ICD-10-CM | POA: Insufficient documentation

## 2013-06-28 DIAGNOSIS — F3289 Other specified depressive episodes: Secondary | ICD-10-CM | POA: Insufficient documentation

## 2013-06-28 DIAGNOSIS — Z880 Allergy status to penicillin: Secondary | ICD-10-CM | POA: Insufficient documentation

## 2013-06-28 HISTORY — PX: ANAL FISTULOTOMY: SHX6423

## 2013-06-28 SURGERY — ANAL FISTULOTOMY
Anesthesia: General | Site: Anus

## 2013-06-28 MED ORDER — LACTATED RINGERS IV SOLN
INTRAVENOUS | Status: DC | PRN
  Administered 2013-06-28 (×2): via INTRAVENOUS

## 2013-06-28 MED ORDER — BUPIVACAINE HCL (PF) 0.5 % IJ SOLN
INTRAMUSCULAR | Status: DC | PRN
  Administered 2013-06-28: 5 mL

## 2013-06-28 MED ORDER — FENTANYL CITRATE 0.05 MG/ML IJ SOLN
25.0000 ug | INTRAMUSCULAR | Status: DC | PRN
Start: 2013-06-28 — End: 2013-06-28
  Administered 2013-06-28 (×2): 50 ug via INTRAVENOUS

## 2013-06-28 MED ORDER — GLYCOPYRROLATE 0.2 MG/ML IJ SOLN
0.2000 mg | Freq: Once | INTRAMUSCULAR | Status: AC
  Administered 2013-06-28: 0.2 mg via INTRAVENOUS

## 2013-06-28 MED ORDER — ONDANSETRON HCL 4 MG/2ML IJ SOLN
4.0000 mg | Freq: Once | INTRAMUSCULAR | Status: AC
  Administered 2013-06-28: 4 mg via INTRAVENOUS

## 2013-06-28 MED ORDER — KETOROLAC TROMETHAMINE 30 MG/ML IJ SOLN
INTRAMUSCULAR | Status: AC
  Filled 2013-06-28: qty 1

## 2013-06-28 MED ORDER — FENTANYL CITRATE 0.05 MG/ML IJ SOLN
25.0000 ug | INTRAMUSCULAR | Status: AC
  Administered 2013-06-28 (×2): 25 ug via INTRAVENOUS

## 2013-06-28 MED ORDER — PROPOFOL 10 MG/ML IV BOLUS
INTRAVENOUS | Status: DC | PRN
  Administered 2013-06-28: 170 mg via INTRAVENOUS

## 2013-06-28 MED ORDER — CIPROFLOXACIN IN D5W 400 MG/200ML IV SOLN
INTRAVENOUS | Status: AC
  Filled 2013-06-28: qty 200

## 2013-06-28 MED ORDER — FENTANYL CITRATE 0.05 MG/ML IJ SOLN
INTRAMUSCULAR | Status: DC | PRN
  Administered 2013-06-28 (×2): 50 ug via INTRAVENOUS
  Administered 2013-06-28: 100 ug via INTRAVENOUS
  Administered 2013-06-28: 50 ug via INTRAVENOUS

## 2013-06-28 MED ORDER — SODIUM CHLORIDE 0.9 % IR SOLN
Status: DC | PRN
  Administered 2013-06-28: 1000 mL

## 2013-06-28 MED ORDER — METRONIDAZOLE IN NACL 5-0.79 MG/ML-% IV SOLN
INTRAVENOUS | Status: AC
  Filled 2013-06-28: qty 100

## 2013-06-28 MED ORDER — HEMOSTATIC AGENTS (NO CHARGE) OPTIME
TOPICAL | Status: DC | PRN
  Administered 2013-06-28: 1 via TOPICAL

## 2013-06-28 MED ORDER — GLYCOPYRROLATE 0.2 MG/ML IJ SOLN
INTRAMUSCULAR | Status: AC
  Filled 2013-06-28: qty 1

## 2013-06-28 MED ORDER — ONDANSETRON HCL 4 MG/2ML IJ SOLN
INTRAMUSCULAR | Status: AC
  Filled 2013-06-28: qty 2

## 2013-06-28 MED ORDER — LIDOCAINE HCL (CARDIAC) 20 MG/ML IV SOLN
INTRAVENOUS | Status: DC | PRN
  Administered 2013-06-28: 50 mg via INTRAVENOUS

## 2013-06-28 MED ORDER — LACTATED RINGERS IV SOLN
INTRAVENOUS | Status: DC
  Administered 2013-06-28: 09:00:00 via INTRAVENOUS

## 2013-06-28 MED ORDER — BUPIVACAINE HCL (PF) 0.5 % IJ SOLN
INTRAMUSCULAR | Status: AC
  Filled 2013-06-28: qty 30

## 2013-06-28 MED ORDER — LIDOCAINE VISCOUS 2 % MT SOLN
OROMUCOSAL | Status: AC
  Filled 2013-06-28: qty 15

## 2013-06-28 MED ORDER — METRONIDAZOLE IN NACL 5-0.79 MG/ML-% IV SOLN
500.0000 mg | INTRAVENOUS | Status: AC
  Administered 2013-06-28: 500 mg via INTRAVENOUS

## 2013-06-28 MED ORDER — LIDOCAINE HCL (PF) 1 % IJ SOLN
INTRAMUSCULAR | Status: AC
  Filled 2013-06-28: qty 5

## 2013-06-28 MED ORDER — ARTIFICIAL TEARS OP OINT
TOPICAL_OINTMENT | OPHTHALMIC | Status: AC
  Filled 2013-06-28: qty 3.5

## 2013-06-28 MED ORDER — ONDANSETRON HCL 4 MG/2ML IJ SOLN: 4.0000 mg | Freq: Once | INTRAMUSCULAR | Status: DC | PRN

## 2013-06-28 MED ORDER — MIDAZOLAM HCL 2 MG/2ML IJ SOLN
1.0000 mg | INTRAMUSCULAR | Status: DC | PRN
  Administered 2013-06-28: 2 mg via INTRAVENOUS

## 2013-06-28 MED ORDER — LIDOCAINE VISCOUS 2 % MT SOLN
OROMUCOSAL | Status: DC | PRN
  Administered 2013-06-28: 20 mL

## 2013-06-28 MED ORDER — CIPROFLOXACIN IN D5W 400 MG/200ML IV SOLN
400.0000 mg | INTRAVENOUS | Status: AC
  Administered 2013-06-28: 400 mg via INTRAVENOUS

## 2013-06-28 MED ORDER — EPHEDRINE SULFATE 50 MG/ML IJ SOLN
INTRAMUSCULAR | Status: DC | PRN
  Administered 2013-06-28 (×3): 10 mg via INTRAVENOUS

## 2013-06-28 MED ORDER — GLYCOPYRROLATE 0.2 MG/ML IJ SOLN
INTRAMUSCULAR | Status: DC | PRN
  Administered 2013-06-28: 0.2 mg via INTRAVENOUS

## 2013-06-28 MED ORDER — ARTIFICIAL TEARS OP OINT
TOPICAL_OINTMENT | OPHTHALMIC | Status: DC | PRN
  Administered 2013-06-28: 1 via OPHTHALMIC

## 2013-06-28 MED ORDER — FENTANYL CITRATE 0.05 MG/ML IJ SOLN
INTRAMUSCULAR | Status: AC
  Filled 2013-06-28: qty 2

## 2013-06-28 MED ORDER — FENTANYL CITRATE 0.05 MG/ML IJ SOLN
INTRAMUSCULAR | Status: AC
  Filled 2013-06-28: qty 5

## 2013-06-28 MED ORDER — KETOROLAC TROMETHAMINE 30 MG/ML IJ SOLN
30.0000 mg | Freq: Once | INTRAMUSCULAR | Status: AC
  Administered 2013-06-28: 30 mg via INTRAVENOUS

## 2013-06-28 MED ORDER — MIDAZOLAM HCL 2 MG/2ML IJ SOLN
INTRAMUSCULAR | Status: AC
  Filled 2013-06-28: qty 2

## 2013-06-28 SURGICAL SUPPLY — 32 items
BAG HAMPER (MISCELLANEOUS) ×3 IMPLANT
CLOTH BEACON ORANGE TIMEOUT ST (SAFETY) ×3 IMPLANT
COVER LIGHT HANDLE STERIS (MISCELLANEOUS) ×6 IMPLANT
COVER MAYO STAND XLG (DRAPE) ×3 IMPLANT
DECANTER SPIKE VIAL GLASS SM (MISCELLANEOUS) ×3 IMPLANT
DRAPE PROXIMA HALF (DRAPES) ×3 IMPLANT
ELECT REM PT RETURN 9FT ADLT (ELECTROSURGICAL) ×3
ELECTRODE REM PT RTRN 9FT ADLT (ELECTROSURGICAL) ×1 IMPLANT
FORMALIN 10 PREFIL 120ML (MISCELLANEOUS) ×3 IMPLANT
GAUZE SPONGE 4X4 12PLY STRL (GAUZE/BANDAGES/DRESSINGS) ×3 IMPLANT
GLOVE BIOGEL PI IND STRL 7.0 (GLOVE) IMPLANT
GLOVE BIOGEL PI INDICATOR 7.0 (GLOVE) ×2
GLOVE ECLIPSE 6.5 STRL STRAW (GLOVE) ×2 IMPLANT
GLOVE EXAM NITRILE LRG STRL (GLOVE) ×2 IMPLANT
GLOVE SURG SS PI 7.5 STRL IVOR (GLOVE) ×6 IMPLANT
GOWN STRL REUS W/TWL LRG LVL3 (GOWN DISPOSABLE) ×3 IMPLANT
GOWN STRL REUS W/TWL XL LVL3 (GOWN DISPOSABLE) ×3 IMPLANT
HEMOSTAT SURGICEL 4X8 (HEMOSTASIS) ×3 IMPLANT
KIT ROOM TURNOVER AP CYSTO (KITS) ×3 IMPLANT
MANIFOLD NEPTUNE II (INSTRUMENTS) ×3 IMPLANT
NDL HYPO 25X1 1.5 SAFETY (NEEDLE) ×1 IMPLANT
NEEDLE HYPO 25X1 1.5 SAFETY (NEEDLE) ×3 IMPLANT
NS IRRIG 1000ML POUR BTL (IV SOLUTION) ×3 IMPLANT
PACK PERI GYN (CUSTOM PROCEDURE TRAY) ×3 IMPLANT
PAD ARMBOARD 7.5X6 YLW CONV (MISCELLANEOUS) ×3 IMPLANT
PENCIL HANDSWITCHING (ELECTRODE) ×2 IMPLANT
SET BASIN LINEN APH (SET/KITS/TRAYS/PACK) ×3 IMPLANT
SPONGE GAUZE 4X4 12PLY (GAUZE/BANDAGES/DRESSINGS) ×1 IMPLANT
SURGILUBE 3G PEEL PACK STRL (MISCELLANEOUS) ×3 IMPLANT
SUT SILK 0 FSL (SUTURE) ×3 IMPLANT
SUT VIC AB 2-0 CT2 27 (SUTURE) ×2 IMPLANT
SYR CONTROL 10ML LL (SYRINGE) ×3 IMPLANT

## 2013-06-28 NOTE — Op Note (Signed)
Patient:  [name]  DOB:  [birth year]  MRN:  [id number]   Preop Diagnosis:  Anal fistula  Postop Diagnosis:  Same  Procedure:  Fistulotomy  Surgeon:  Aviva Signs, M.D.  Anes:  General  Indications:  Patient is a 63 year old white male status post fistulotomy in the past who now presents with recurrent drainage in the perianal region. He denies any incontinence of stool. The risks and benefits of the procedure were fully explained to the patient, who gave informed consent.  Procedure note:  The patient was placed in the lithotomy position after general anesthesia was administered. The perineum was prepped and draped using usual sterile technique with Betadine. Surgical site confirmation was performed.  On anoscopy, the patient was noted to have a serosanguineous drainage at the 2:00 position. Using a probe, the draining sinus did not enter the rectum. The fistulous tract was unroofed. It did not enter the external sphincter mechanism. Granulation tissue was done and this was curetted. A bleeding was controlled using Bovie electrocautery. 0.5% Sensorcaine was instilled the surrounding wound. Surgicel and Viscous Xylocaine was also placed into the rectum.  All tape and needle counts were correct the end of the procedure. Patient was awakened and transferred to PACU in stable condition.  Complications:  None  EBL:  Minimal  Specimen:  None

## 2013-06-28 NOTE — Anesthesia Postprocedure Evaluation (Signed)
  Anesthesia Post-op Note  Patient: [name]  Procedure(s) Performed: Procedure(s): ANAL FISTULOTOMY (N/A)  Patient Location: PACU  Anesthesia Type:General  Level of Consciousness: sedated and patient cooperative  Airway and Oxygen Therapy: Patient Spontanous Breathing and Patient connected to face mask oxygen  Post-op Pain: none  Post-op Assessment: Post-op Vital signs reviewed, Patient's Cardiovascular Status Stable, Respiratory Function Stable, Patent Airway and No signs of Nausea or vomiting  Post-op Vital Signs: Reviewed and stable  Last Vitals:  Filed Vitals:   06/28/13 0915  BP: 115/61  Temp:   Resp: 14    Complications: No apparent anesthesia complications

## 2013-06-28 NOTE — Anesthesia Procedure Notes (Signed)
Procedure Name: LMA Insertion Date/Time: 06/28/2013 9:32 AM Performed by: Andree Elk, Arthi Mcdonald A Pre-anesthesia Checklist: Patient identified, Timeout performed, Emergency Drugs available, Suction available and Patient being monitored Patient Re-evaluated:Patient Re-evaluated prior to inductionOxygen Delivery Method: Circle system utilized Preoxygenation: Pre-oxygenation with 100% oxygen Intubation Type: IV induction Ventilation: Mask ventilation without difficulty LMA: LMA inserted LMA Size: 5.0 Placement Confirmation: positive ETCO2 and breath sounds checked- equal and bilateral Tube secured with: Tape Dental Injury: Teeth and Oropharynx as per pre-operative assessment  Comments: Placed LMA 4 placed initially without a good seal removed and LMA 5 placed with ease; easy mask; VSS throughout.

## 2013-06-28 NOTE — Interval H&P Note (Signed)
History and Physical Interval Note:  06/28/2013 8:54 AM  [name]  has presented today for surgery, with the diagnosis of anal fistula  The various methods of treatment have been discussed with the patient and family. After consideration of risks, benefits and other options for treatment, the patient has consented to  Procedure(s): ANAL FISTULOTOMY (N/A) as a surgical intervention .  The patient's history has been reviewed, patient examined, no change in status, stable for surgery.  I have reviewed the patient's chart and labs.  Questions were answered to the patient's satisfaction.     [name]

## 2013-06-28 NOTE — Discharge Instructions (Signed)
Soak in tub daily, sitz bath daily as able.     Anal Fistula An anal fistula is an abnormal tunnel that develops between the bowel and skin near the outside of the anus, where feces comes out. The anus has a number of tiny glands that make lubricating fluid. Sometimes these glands can become plugged and infected. This may lead to the development of a fluid-filled pocket (abscess). An anal fistula often develops after this infection or abscess. It is nearly always caused by a past or current anal abscess.  CAUSES  Though an anal fistula is almost always caused by a past or current anal abscess, other causes can include:  A complication of surgery.  Trauma to the rectal area.  Radiation to the area.  Other medical conditions or diseases, such as:   Chronic inflammatory bowel disease, such as Crohn disease or ulcerative colitis.   Colon or rectal cancer.   Diverticular disease, such as diverticulitis.   A sexually transmitted disease, such as gonorrhea, chlamydia, or syphilis.  An HIV infection or AIDS.  SYMPTOMS   Throbbing or constant pain that may be worse when sitting.   Swelling or irritation around the anus.   Drainage of pus or blood from an opening near the anus.   Pain with bowel movements.  Fever or chills. DIAGNOSIS  Your caregiver will examine the area to find the openings of the anal fistula and the fistula tract. The external opening of the anal fistula may be seen during a physical examination. Other examinations that may be performed include:   Examination of the rectal area with a gloved hand (digital rectal exam).   Examination with a probe or scope to help locate the internal opening of the fistula.   Injection of a dye into the fistula opening. X-rays can be taken to find the exact location and path of the fistula.   An MRI or ultrasound of the anal area.  Other tests may be performed to find the cause of the anal fistula.   TREATMENT   The most common treatment for an anal fistula is surgery. There are different surgery options depending on where your fistula is located and how complex the fistula is. Surgical options include:  A fistulotomy. This surgery involves opening up the whole fistula and draining the contents inside to promote healing.  Seton placement. A silk string (seton) is placed into the fistula during a fistulotomy to drain any infection to promote healing.  Advancement flap procedure. Tissue is removed from your rectum or the skin around the anus and is attached to the opening of the fistula.  Bioprosthetic plug. A cone-shaped plug is made from your tissue and is used to block the opening of the fistula. Some anal fistulas do not require surgery. A fibrin glue is a non-surgical option that involves injecting the glue to seal the fistula. You also may be prescribed an antibiotic medicine to treat an infection.  HOME CARE INSTRUCTIONS   Take your antibiotics as directed. Finish them even if you start to feel better.  Only take over-the-counter or prescription medicines as directed by your caregiver.Use a stool softener or laxative, if recommended.   Eat a high-fiber diet to help avoid constipation or as directed by your caregiver.  Drink enough water to keep your urine clear or pale yellow.   A warm sitz bath may be soothing and help with healing. You may take warm sitz baths for 15 20 minutes, 3 4 times a day  to ease pain and discomfort.   Follow excellent hygiene to keep the anal area as clean and dry as possible. Use wet toilet paper or moist towelettes after each bowel movement.  SEEK MEDICAL CARE IF: You have increased pain not controlled with medicines.  SEEK IMMEDIATE MEDICAL CARE IF:  You have severe, intolerable pain.  You have new swelling, redness, or discharge around the anal area.  You have tenderness or warmth around the anal area.  You have chills or diarrhea.  You have severe  problems urinating or having a bowel movement.   You have a fever or persistent symptoms for more than 2 3 days.   You have a fever and your symptoms suddenly get worse.  MAKE SURE YOU:   Understand these instructions.  Will watch your condition.  Will get help right away if you are not doing well or get worse. Document Released: 01/07/2008 Document Revised: 01/11/2012 Document Reviewed: 11/29/2010 [facility] Patient Information 2014 [place].

## 2013-06-28 NOTE — Transfer of Care (Signed)
Immediate Anesthesia Transfer of Care Note  Patient: [name]  Procedure(s) Performed: Procedure(s): ANAL FISTULOTOMY (N/A)  Patient Location: PACU  Anesthesia Type:General  Level of Consciousness: sedated and patient cooperative  Airway & Oxygen Therapy: Patient Spontanous Breathing and Patient connected to face mask oxygen  Post-op Assessment: Report given to PACU RN and Post -op Vital signs reviewed and stable  Post vital signs: Reviewed and stable  Complications: No apparent anesthesia complications

## 2013-06-28 NOTE — Anesthesia Preprocedure Evaluation (Signed)
Anesthesia Evaluation  Patient identified by MRN, date of birth, ID band Patient awake    Reviewed: Allergy & Precautions, H&P , NPO status , Patient's Chart, lab work & pertinent test results, reviewed documented beta blocker date and time   Airway Mallampati: II TM Distance: >3 FB     Dental  (+) Teeth Intact   Pulmonary sleep apnea , COPDformer smoker,  breath sounds clear to auscultation        Cardiovascular hypertension, Pt. on medications and Pt. on home beta blockers - angina+ CAD and + Past MI Rhythm:Regular Rate:Normal     Neuro/Psych PSYCHIATRIC DISORDERS Depression    GI/Hepatic   Endo/Other    Renal/GU      Musculoskeletal   Abdominal   Peds  Hematology   Anesthesia Other Findings   Reproductive/Obstetrics                           Anesthesia Physical Anesthesia Plan  ASA: III  Anesthesia Plan: General   Post-op Pain Management:    Induction: Intravenous  Airway Management Planned: LMA  Additional Equipment:   Intra-op Plan:   Post-operative Plan: Extubation in OR  Informed Consent: I have reviewed the patients History and Physical, chart, labs and discussed the procedure including the risks, benefits and alternatives for the proposed anesthesia with the patient or authorized representative who has indicated his/her understanding and acceptance.     Plan Discussed with:   Anesthesia Plan Comments:         Anesthesia Quick Evaluation

## 2013-07-03 ENCOUNTER — Encounter (HOSPITAL_COMMUNITY): Payer: Self-pay | Admitting: General Surgery

## 2013-08-03 ENCOUNTER — Encounter: Payer: Self-pay | Admitting: Cardiovascular Disease

## 2013-09-11 ENCOUNTER — Other Ambulatory Visit: Payer: Self-pay | Admitting: Cardiovascular Disease

## 2013-09-11 NOTE — Telephone Encounter (Signed)
Rx was sent to pharmacy electronically. 

## 2014-05-16 ENCOUNTER — Ambulatory Visit (INDEPENDENT_AMBULATORY_CARE_PROVIDER_SITE_OTHER): Payer: 59 | Admitting: Cardiovascular Disease

## 2014-05-16 ENCOUNTER — Encounter: Payer: Self-pay | Admitting: Cardiovascular Disease

## 2014-05-16 VITALS — BP 130/82 | HR 68 | Ht 69.0 in | Wt 228.2 lb

## 2014-05-16 DIAGNOSIS — E785 Hyperlipidemia, unspecified: Secondary | ICD-10-CM | POA: Diagnosis not present

## 2014-05-16 DIAGNOSIS — I2510 Atherosclerotic heart disease of native coronary artery without angina pectoris: Secondary | ICD-10-CM | POA: Diagnosis not present

## 2014-05-16 DIAGNOSIS — I10 Essential (primary) hypertension: Secondary | ICD-10-CM | POA: Diagnosis not present

## 2014-05-16 DIAGNOSIS — E669 Obesity, unspecified: Secondary | ICD-10-CM

## 2014-05-16 DIAGNOSIS — G4733 Obstructive sleep apnea (adult) (pediatric): Secondary | ICD-10-CM

## 2014-05-16 NOTE — Patient Instructions (Signed)
Your physician recommends that you return for lab work fasting.   Your physician wants you to follow-up in: 1 year or sooner if needed. You will receive a reminder letter in the mail two months in advance. If you don't receive a letter, please call our office to schedule the follow-up appointment.   

## 2014-05-18 ENCOUNTER — Encounter: Payer: Self-pay | Admitting: Cardiovascular Disease

## 2014-05-18 DIAGNOSIS — E66811 Obesity, class 1: Secondary | ICD-10-CM | POA: Insufficient documentation

## 2014-05-18 DIAGNOSIS — E669 Obesity, unspecified: Secondary | ICD-10-CM | POA: Insufficient documentation

## 2014-05-18 NOTE — Progress Notes (Signed)
Patient ID: [name], male   DOB: [birth year], 64 y.o.   MRN: [id number]     HPI: [name] is a 64 y.o. male who is a former patient of Dr. Rollene Fare who presents to the office today for one-year follow-up evaluation after status and care with me last year.  [name] suffered an inferior wall MI 1995 and catheterization revealed RCA occlusion with left-to-right collaterals. He was treated initially with PTCA of his RCA. His last cardiac catheterization was in 2009 which showed an RCA occlusion with collaterals with no significant LAD disease, although there was mild 20% diagonal stenosis.  An echo Doppler study done in August 2014 by Dr. Rollene Fare showed mild LVH with mild hypokinesis of the inferolateral wall but with preserved global systolic function with an EF of 55-60%. He had normal diastolic parameters.  In January 2015 he was admitted by the hospitalist service with chest pain. A stress Myoview study did not demonstrate convincing inducible myocardial ischemia. There was limited evaluation of the inferior wall due to movement and overlap of the diaphragm and activity within the left lobe of the liver. Ejection fraction was 47%. He was seen by Dr. Rayann Heman for cardiology consultation who recommended no further cardiac workup.  Lipid evaluation reveals a total cholesterol 163 triglycerides 100 HDL 31 LDL 112 in the past, he had some difficulty taking statins.  Additional problems include hypertension, obesity, and arthritis. He has a remote history of right buttock abscess leading to hospitalization in December 2013.  When I initially saw him, I discontinued his gemfibrozil and recommended a trial of Livalo to take and hopefully tolerate  along with coenzyme Q10.  I titrated his amlodipine to 5 mg daily for more optimal blood pressure control.  I was also very concerned about obstructive sleep apnea and referred him for a sleep study.  Due to my concerns for obstructive sleep apnea, he  underwent a split-night protocol, on 05/10/2012.  This revealed severe sleep apnea with an AHI of 75.6 per hour.  During REM sleep this was 40 per hour.  He had significant oxygen desaturation to 78% with non-REM sleep and 76% with REM sleep.  He also had a significant positional component to his sleep apnea.  He was started on CPAP titration but due to high-pressure he was was switched to a BiPAP mode.  Ultimately, a BiPAP auto unit was recommended with an EPAP minimum of 10 and a IPAP maximum up to 25.  He also did have periodic lid movements with sleep with an index of 18.  He apparently initially used BiPAP for only 3 weeks but was having difficulty with his mass.  However, before you in being seen in follow-up evaluation.  He returned his BiPAP unit due to concerns that he was not compliant and would have to pay for this without insurance.  He denies any recent chest pain.  He does not sleep on his back.  He snores.  He is unaware of palpitations.  He presents for one-year evaluation.  Past Medical History  Diagnosis Date  . Depression   . Myocardial infarct     a. MI in 1994 s/p PTCA alone  . Hypertension   . Nephrolithiasis   . Complication of anesthesia     heart rate dropped twice during surg  . Arthritis   . CAD (coronary artery disease)     a. Mid RCA CTO w/ L-R collateralization, nonobstructive dz elsewhere by 2009 cath  . Obesity   .  History of tobacco abuse   . Hyperlipidemia     a. statin, niacin intolerant  . Sleep apnea     wears TPAP machine at night     Past Surgical History  Procedure Laterality Date  . Knee surgery    . Back surgery    . Wrist surgery      carpal tunnel  . Cystectomy    . Nose surgery    . Pilonidal cyst excision    . Incision and drainage abscess Right 05/23/2012    Procedure: INCISION AND DRAINAGE ABSCESS;  Surgeon: [name], MD;  Location: [facility];  Service: General;  Laterality: Right;  . Splenectomy    . Cardiac catheterization  1994,  2009    a. PTCA alone in 1994 b. RCA CTO w/ L-R collaterals, 40-50% prox RCA, 20% prox LAD; EF 50-55%, inferoapical HK  . Lower extremity arterial doppler  08/02/2007    Bilateral ABIs-no evidence of arterial insufficiency. Bilateral PVRs demonstrate normal waveforms.  . Cardiac catheterization  04/02/2001    Continue medical therapy.  . Cardiac catheterization  11/07/2000    Continue medical therapy.  . Cardiac catheterization  09/24/1996    Continue medical therapy  . Cardiovascular stress test  04/06/2010    No scintigraphic evidence of inducible myocardial ischemia. No ECG changes. EKG negative for ischemia.  . Transthoracic echocardiogram  03/05/2009    EF >55%, normal LV wall thickness.  [name] fistulotomy N/A 06/28/2013    Procedure: ANAL FISTULOTOMY;  Surgeon: [name], MD;  Location: [facility];  Service: General;  Laterality: N/A;    Allergies  Allergen Reactions  . Niacin And Related Other (See Comments)    flush  . Penicillins   . Statins     myalgias    Current Outpatient Prescriptions  Medication Sig Dispense Refill  . albuterol (PROVENTIL HFA;VENTOLIN HFA) 108 (90 BASE) MCG/ACT inhaler Inhale 1 puff into the lungs every 6 (six) hours as needed for wheezing or shortness of breath.    . ALPRAZolam (XANAX) 0.5 MG tablet Take 0.5 mg by mouth 4 (four) times daily as needed for anxiety or sleep.    Marland Kitchen amLODipine (NORVASC) 5 MG tablet Take 1 tablet (5 mg total) by mouth daily. 180 tablet 3  . aspirin 81 MG tablet Take 162 mg by mouth every morning.     . citalopram (CELEXA) 10 MG tablet Take 10 mg by mouth daily.    Marland Kitchen ezetimibe (ZETIA) 10 MG tablet Take 10 mg by mouth daily.    . metoprolol succinate (TOPROL-XL) 50 MG 24 hr tablet Take 50 mg by mouth daily. Take with or immediately following a meal.    . nitroGLYCERIN (NITROSTAT) 0.4 MG SL tablet Place 0.4 mg under the tongue every 5 (five) minutes as needed for chest pain.    Marland Kitchen oxyCODONE-acetaminophen (PERCOCET) 7.5-325 MG per  tablet Take 1 tablet by mouth every 4 (four) hours as needed for pain.    . ramipril (ALTACE) 10 MG capsule TAKE 1 CAPSULE DAILY 90 capsule 2  . VIAGRA 100 MG tablet Take 1 tablet by mouth as needed.    . zolpidem (AMBIEN) 5 MG tablet Take 1 tablet (5 mg total) by mouth at bedtime as needed for sleep. 30 tablet 1   No current facility-administered medications for this visit.    History   Social History  . Marital Status: Married    Spouse Name: N/A  . Number of Children: N/A  .  Years of Education: N/A   Occupational History  . Not on file.   Social History Main Topics  . Smoking status: Former Smoker -- 2.00 packs/day for 28 years    Types: Cigarettes    Quit date: 03/28/1990  . Smokeless tobacco: Former Systems developer    Types: Chew  . Alcohol Use: No  . Drug Use: No  . Sexual Activity: Yes   Other Topics Concern  . Not on file   Social History Narrative   Lives in [place], [name]. Married, 2 children.     Family History  Problem Relation Age of Onset  . CAD Father     CABG   . CAD Brother     CABG in 25s  . CAD Brother   . Arthritis/Rheumatoid Mother   . Hyperlipidemia Mother   . Thrombocytopenia Mother   . COPD Son     smoker  . Alzheimer's disease Maternal Grandmother   . Lung disease Paternal Grandfather    ROS General: Negative; No fevers, chills, or night sweats HEENT: Negative; No changes in vision or hearing, sinus congestion, difficulty swallowing Pulmonary: Negative; No cough, wheezing, shortness of breath, hemoptysis Cardiovascular: Negative; No chest pain, presyncope, syncope, palpatations GI: Negative; No nausea, vomiting, diarrhea, or abdominal pain GU: Negative; No dysuria, hematuria, or difficulty voiding Musculoskeletal: Negative; no myalgias, joint pain, or weakness Hematologic: Negative; no easy bruising, bleeding Endocrine: Negative; no heat/cold intolerance Neuro: Negative; no changes in balance, headaches Skin: Negative; No rashes or skin  lesions Psychiatric: Negative; No behavioral problems, depression Sleep: positive severe sleep apnea, currently untreated;  daytime sleepiness, hypersomnolence, and snoring, no bruxism, he admits to limb movement, but denies painful restless legs, no hypnogognic hallucinations, no cataplexy   PE BP 130/82 mmHg  Pulse 68  Ht 5\' 9"  (1.753 m)  Wt 228 lb 3.2 oz (103.511 kg)  BMI 33.68 kg/m2  General: Alert, oriented, no distress.  Skin: normal turgor, no rashes HEENT: Normocephalic, atraumatic. Pupils round and reactive; sclera anicteric;no lid lag. Extraocular muscles intact;; no xanthelasmas. Nose without nasal septal hypertrophy Mouth/Parynx benign; Mallinpatti scale 3 Neck: No JVD, no carotid bruits; normal carotid upstroke Lungs: clear to ausculatation and percussion; no wheezing or rales Chest wall: no tenderness to palpitation; pectus excavatum chest wall deformity Heart: RRR, s1 s2 normal; 1/6 systolic murmur;no diastolic murmur, rub thrills or heaves Abdomen: soft, nontender; no hepatosplenomehaly, BS+; abdominal aorta nontender and not dilated by palpation. Back: no CVA tenderness Pulses 2+ Extremities: no clubbing cyanosis or edema, Homan's sign negative  Neurologic: grossly nonfocal; cranial nerves grossly normal. Psychologic: normal affect and mood.  ECG (independently read by me): Normal sinus rhythm at 68 bpm.  Nonspecific T changes.  Normal intervals.  April 2015 ECG (independently read by me): Normal sinus rhythm at 61 beats per minute.  Normal intervals  Prior 03/28/2013 ECG (independently read by me): Sinus rhythm at 66 beats per minute. No ectopy. Q-wave in lead 3   LABS:  BMET    Component Value Date/Time   NA 142 06/26/2013 1335   K 4.6 06/26/2013 1335   CL 101 06/26/2013 1335   CO2 29 06/26/2013 1335   GLUCOSE 91 06/26/2013 1335   BUN 14 06/26/2013 1335   CREATININE 0.92 06/26/2013 1335   CREATININE 0.82 09/12/2012 0745   CALCIUM 9.3 06/26/2013 1335    GFRNONAA 88* 06/26/2013 1335   GFRAA >90 06/26/2013 1335     Hepatic Function Panel     Component Value Date/Time   PROT  8.2 02/15/2013 2140   ALBUMIN 4.0 02/15/2013 2140   AST 20 02/15/2013 2140   ALT 18 02/15/2013 2140   ALKPHOS 114 02/15/2013 2140   BILITOT 0.2* 02/15/2013 2140     CBC    Component Value Date/Time   WBC 11.0* 06/26/2013 1335   RBC 4.77 06/26/2013 1335   HGB 14.2 06/26/2013 1335   HCT 42.7 06/26/2013 1335   PLT 471* 06/26/2013 1335   MCV 89.5 06/26/2013 1335   MCH 29.8 06/26/2013 1335   MCHC 33.3 06/26/2013 1335   RDW 14.1 06/26/2013 1335   LYMPHSABS 2.0 06/26/2013 1335   MONOABS 1.2* 06/26/2013 1335   EOSABS 0.2 06/26/2013 1335   BASOSABS 0.0 06/26/2013 1335     BNP No results found for: PROBNP  Lipid Panel     Component Value Date/Time   CHOL 163 02/16/2013 0340   TRIG 100 02/16/2013 0340   HDL 31* 02/16/2013 0340   CHOLHDL 5.3 02/16/2013 0340   VLDL 20 02/16/2013 0340   LDLCALC 112* 02/16/2013 0340     RADIOLOGY: No results found.    ASSESSMENT AND PLAN:  [name] is a 64 year old gentleman who suffered an inferior wall myocardial infarction 21 years ago due to total RCA occlusion with good left to right collaterals without significant contralateral disease. A recent nuclear perfusion study did not demonstrate significant ischemia. However, ejection fraction was 47%. His echo Doppler study did show inferolateral wall motion abnormality.  Over the past year, he is been fairly stable without recurrent anginal symptoms and admits to his blood pressure being stable.  He is on amlodipine 5 mg, Toprol-XL 50 mg, in addition to Remeron, Toprol 10 mg daily, both for blood pressure and CAD.  He is not required any nitrates.  He has a prescription for Viagra for erectile dysfunction but has never used this.  He is not on any nitrate therapy.  Viagra His blood pressure is better today and he has tolerated the increased dose of amlodipine at 5  mg. he no longer is taking a Livalo, and has had myalgias with statins.  He is on Zetia 10 mg for his hyperlipidemia.  He is mildly obese with a body mass index of 33.7.  Weight reduction was recommended.  I have recommended exercise at least 5 days a week for 30 minutes.  I reviewed his sleep study from a year ago.  This was notable for severe obstructive sleep apnea with an AHI of 75 per hour.  Significant oxygen desaturation to 76%.  We discussed potential cardial vascular risks associated with untreated sleep apnea.  Apparently he never really gave the machine significant opportunity for him to note benefit or to consider sig mask adjustment prior to turning in his unit.  Consequently, he does not have a BiPAP unit and if he is to reconsider initiation of therapy.  Unfortunately, he will need to undergo a new study based on his insurance.  Follow-up laboratory is recommended.  I will see him in one year for reevaluation or sooner as needed.   Troy Sine, MD, [facility]  05/18/2014 9:40 AM

## 2014-06-08 ENCOUNTER — Other Ambulatory Visit: Payer: Self-pay | Admitting: Cardiovascular Disease

## 2014-06-09 NOTE — Telephone Encounter (Signed)
Rx refill sent to patient pharmacy   

## 2014-06-11 LAB — CBC
HCT: 44.6 % (ref 39.0–52.0)
Hemoglobin: 15.4 g/dL (ref 13.0–17.0)
MCH: 31.1 pg (ref 26.0–34.0)
MCHC: 34.5 g/dL (ref 30.0–36.0)
MCV: 90.1 fL (ref 78.0–100.0)
MPV: 11 fL (ref 8.6–12.4)
Platelets: 444 K/uL — ABNORMAL HIGH (ref 150–400)
RBC: 4.95 MIL/uL (ref 4.22–5.81)
RDW: 14.1 % (ref 11.5–15.5)
WBC: 6.3 K/uL (ref 4.0–10.5)

## 2014-06-11 LAB — COMPREHENSIVE METABOLIC PANEL
ALT: 17 U/L (ref 0–53)
AST: 19 U/L (ref 0–37)
Albumin: 4.2 g/dL (ref 3.5–5.2)
Alkaline Phosphatase: 102 U/L (ref 39–117)
BUN: 11 mg/dL (ref 6–23)
CO2: 27 mEq/L (ref 19–32)
Calcium: 9.1 mg/dL (ref 8.4–10.5)
Chloride: 101 mEq/L (ref 96–112)
Creat: 0.76 mg/dL (ref 0.50–1.35)
Glucose, Bld: 101 mg/dL — ABNORMAL HIGH (ref 70–99)
Potassium: 4.7 mEq/L (ref 3.5–5.3)
Sodium: 137 mEq/L (ref 135–145)
Total Bilirubin: 0.5 mg/dL (ref 0.2–1.2)
Total Protein: 7.3 g/dL (ref 6.0–8.3)

## 2014-06-11 LAB — LIPID PANEL
Cholesterol: 192 mg/dL (ref 0–200)
HDL: 32 mg/dL — ABNORMAL LOW (ref >=40)
LDL Cholesterol: 126 mg/dL — ABNORMAL HIGH (ref 0–99)
Total CHOL/HDL Ratio: 6 Ratio
Triglycerides: 170 mg/dL — ABNORMAL HIGH (ref <150)
VLDL: 34 mg/dL (ref 0–40)

## 2014-06-11 LAB — TSH: TSH: 1.315 u[IU]/mL (ref 0.350–4.500)

## 2014-10-03 ENCOUNTER — Other Ambulatory Visit: Payer: Self-pay | Admitting: Orthopedic Surgery

## 2014-10-07 ENCOUNTER — Encounter (HOSPITAL_BASED_OUTPATIENT_CLINIC_OR_DEPARTMENT_OTHER)
Admission: RE | Admit: 2014-10-07 | Discharge: 2014-10-07 | Disposition: A | Discharge status: Home / Self Care | Payer: 59 | Source: Ambulatory Visit | Attending: Orthopedic Surgery | Admitting: Orthopedic Surgery

## 2014-10-07 ENCOUNTER — Encounter (HOSPITAL_BASED_OUTPATIENT_CLINIC_OR_DEPARTMENT_OTHER): Payer: Self-pay | Admitting: *Deleted

## 2014-10-07 DIAGNOSIS — E785 Hyperlipidemia, unspecified: Secondary | ICD-10-CM | POA: Diagnosis not present

## 2014-10-07 DIAGNOSIS — I252 Old myocardial infarction: Secondary | ICD-10-CM | POA: Diagnosis not present

## 2014-10-07 DIAGNOSIS — I10 Essential (primary) hypertension: Secondary | ICD-10-CM | POA: Diagnosis not present

## 2014-10-07 DIAGNOSIS — F329 Major depressive disorder, single episode, unspecified: Secondary | ICD-10-CM | POA: Diagnosis not present

## 2014-10-07 DIAGNOSIS — Z79899 Other long term (current) drug therapy: Secondary | ICD-10-CM | POA: Diagnosis not present

## 2014-10-07 DIAGNOSIS — Z87891 Personal history of nicotine dependence: Secondary | ICD-10-CM | POA: Diagnosis not present

## 2014-10-07 DIAGNOSIS — G4730 Sleep apnea, unspecified: Secondary | ICD-10-CM | POA: Diagnosis not present

## 2014-10-07 DIAGNOSIS — G5602 Carpal tunnel syndrome, left upper limb: Secondary | ICD-10-CM | POA: Diagnosis present

## 2014-10-07 DIAGNOSIS — Z7982 Long term (current) use of aspirin: Secondary | ICD-10-CM | POA: Diagnosis not present

## 2014-10-07 DIAGNOSIS — Z6833 Body mass index (BMI) 33.0-33.9, adult: Secondary | ICD-10-CM | POA: Diagnosis not present

## 2014-10-07 DIAGNOSIS — E669 Obesity, unspecified: Secondary | ICD-10-CM | POA: Diagnosis not present

## 2014-10-07 DIAGNOSIS — F419 Anxiety disorder, unspecified: Secondary | ICD-10-CM | POA: Diagnosis not present

## 2014-10-07 DIAGNOSIS — I2510 Atherosclerotic heart disease of native coronary artery without angina pectoris: Secondary | ICD-10-CM | POA: Diagnosis not present

## 2014-10-07 LAB — BASIC METABOLIC PANEL
Anion gap: 7 (ref 5–15)
BUN: 9 mg/dL (ref 6–20)
CO2: 25 mmol/L (ref 22–32)
Calcium: 9.2 mg/dL (ref 8.9–10.3)
Chloride: 106 mmol/L (ref 101–111)
Creatinine, Ser: 0.75 mg/dL (ref 0.61–1.24)
GFR calc Af Amer: >60 mL/min (ref >60)
GFR calc non Af Amer: >60 mL/min (ref >60)
Glucose, Bld: 78 mg/dL (ref 65–99)
Potassium: 4.6 mmol/L (ref 3.5–5.1)
Sodium: 138 mmol/L (ref 135–145)

## 2014-10-07 NOTE — Progress Notes (Signed)
Chart reviewed by Dr Al Corpus, due to patient hx of severe sleep apnea and no CPAP, BIPAP use, patient will be required to spend the night in [facility] for observation.

## 2014-10-09 ENCOUNTER — Ambulatory Visit (HOSPITAL_BASED_OUTPATIENT_CLINIC_OR_DEPARTMENT_OTHER): Payer: 59 | Admitting: Certified Registered"

## 2014-10-09 ENCOUNTER — Ambulatory Visit (HOSPITAL_BASED_OUTPATIENT_CLINIC_OR_DEPARTMENT_OTHER)
Admission: RE | Admit: 2014-10-09 | Discharge: 2014-10-09 | Disposition: A | Discharge status: Home / Self Care | Payer: 59 | Source: Ambulatory Visit | Attending: Orthopedic Surgery | Admitting: Orthopedic Surgery

## 2014-10-09 ENCOUNTER — Encounter (HOSPITAL_BASED_OUTPATIENT_CLINIC_OR_DEPARTMENT_OTHER): Payer: Self-pay | Admitting: *Deleted

## 2014-10-09 ENCOUNTER — Encounter (HOSPITAL_BASED_OUTPATIENT_CLINIC_OR_DEPARTMENT_OTHER)
Admission: RE | Disposition: A | Discharge status: Home / Self Care | Payer: Self-pay | Source: Ambulatory Visit | Attending: Orthopedic Surgery

## 2014-10-09 DIAGNOSIS — G5602 Carpal tunnel syndrome, left upper limb: Secondary | ICD-10-CM | POA: Diagnosis not present

## 2014-10-09 DIAGNOSIS — F419 Anxiety disorder, unspecified: Secondary | ICD-10-CM | POA: Insufficient documentation

## 2014-10-09 DIAGNOSIS — I10 Essential (primary) hypertension: Secondary | ICD-10-CM | POA: Insufficient documentation

## 2014-10-09 DIAGNOSIS — Z87891 Personal history of nicotine dependence: Secondary | ICD-10-CM | POA: Insufficient documentation

## 2014-10-09 DIAGNOSIS — I2510 Atherosclerotic heart disease of native coronary artery without angina pectoris: Secondary | ICD-10-CM | POA: Insufficient documentation

## 2014-10-09 DIAGNOSIS — Z7982 Long term (current) use of aspirin: Secondary | ICD-10-CM | POA: Insufficient documentation

## 2014-10-09 DIAGNOSIS — E785 Hyperlipidemia, unspecified: Secondary | ICD-10-CM | POA: Insufficient documentation

## 2014-10-09 DIAGNOSIS — Z79899 Other long term (current) drug therapy: Secondary | ICD-10-CM | POA: Insufficient documentation

## 2014-10-09 DIAGNOSIS — Z6833 Body mass index (BMI) 33.0-33.9, adult: Secondary | ICD-10-CM | POA: Insufficient documentation

## 2014-10-09 DIAGNOSIS — F329 Major depressive disorder, single episode, unspecified: Secondary | ICD-10-CM | POA: Insufficient documentation

## 2014-10-09 DIAGNOSIS — E669 Obesity, unspecified: Secondary | ICD-10-CM | POA: Insufficient documentation

## 2014-10-09 DIAGNOSIS — G4730 Sleep apnea, unspecified: Secondary | ICD-10-CM | POA: Insufficient documentation

## 2014-10-09 DIAGNOSIS — I252 Old myocardial infarction: Secondary | ICD-10-CM | POA: Insufficient documentation

## 2014-10-09 HISTORY — DX: Anxiety disorder, unspecified: F41.9

## 2014-10-09 HISTORY — PX: CARPAL TUNNEL RELEASE: SHX101

## 2014-10-09 LAB — POCT HEMOGLOBIN-HEMACUE: Hemoglobin: 14.8 g/dL (ref 13.0–17.0)

## 2014-10-09 SURGERY — CARPAL TUNNEL RELEASE
Anesthesia: Monitor Anesthesia Care | Site: Wrist | Laterality: Left

## 2014-10-09 MED ORDER — GLYCOPYRROLATE 0.2 MG/ML IJ SOLN: 0.2000 mg | Freq: Once | INTRAMUSCULAR | Status: DC | PRN

## 2014-10-09 MED ORDER — LIDOCAINE HCL (PF) 0.5 % IJ SOLN
INTRAMUSCULAR | Status: DC | PRN
  Administered 2014-10-09 (×2): 30 mL via INTRAVENOUS

## 2014-10-09 MED ORDER — ONDANSETRON HCL 4 MG/2ML IJ SOLN
INTRAMUSCULAR | Status: DC | PRN
  Administered 2014-10-09: 4 mg via INTRAVENOUS

## 2014-10-09 MED ORDER — VANCOMYCIN HCL IN DEXTROSE 1-5 GM/200ML-% IV SOLN
1000.0000 mg | INTRAVENOUS | Status: AC
  Administered 2014-10-09: 1000 mg via INTRAVENOUS

## 2014-10-09 MED ORDER — PROMETHAZINE HCL 25 MG/ML IJ SOLN: 6.2500 mg | INTRAMUSCULAR | Status: DC | PRN

## 2014-10-09 MED ORDER — HYDROCODONE-ACETAMINOPHEN 5-325 MG PO TABS: ORAL_TABLET | ORAL | Status: DC

## 2014-10-09 MED ORDER — MIDAZOLAM HCL 2 MG/2ML IJ SOLN
INTRAMUSCULAR | Status: AC
  Filled 2014-10-09: qty 4

## 2014-10-09 MED ORDER — VANCOMYCIN HCL IN DEXTROSE 1-5 GM/200ML-% IV SOLN
INTRAVENOUS | Status: AC
  Filled 2014-10-09: qty 200

## 2014-10-09 MED ORDER — PROPOFOL 500 MG/50ML IV EMUL
INTRAVENOUS | Status: AC
  Filled 2014-10-09: qty 50

## 2014-10-09 MED ORDER — LACTATED RINGERS IV SOLN
INTRAVENOUS | Status: DC
  Administered 2014-10-09 (×2): via INTRAVENOUS

## 2014-10-09 MED ORDER — BUPIVACAINE HCL (PF) 0.25 % IJ SOLN
INTRAMUSCULAR | Status: DC | PRN
Start: 2014-10-09 — End: 2014-10-09
  Administered 2014-10-09: 10 mL

## 2014-10-09 MED ORDER — SCOPOLAMINE 1 MG/3DAYS TD PT72: 1.0000 | MEDICATED_PATCH | Freq: Once | TRANSDERMAL | Status: DC | PRN

## 2014-10-09 MED ORDER — HYDROMORPHONE HCL 1 MG/ML IJ SOLN: 0.2500 mg | INTRAMUSCULAR | Status: DC | PRN

## 2014-10-09 MED ORDER — MIDAZOLAM HCL 2 MG/2ML IJ SOLN
1.0000 mg | INTRAMUSCULAR | Status: DC | PRN
  Administered 2014-10-09: 1 mg via INTRAVENOUS

## 2014-10-09 MED ORDER — CHLORHEXIDINE GLUCONATE 4 % EX LIQD: 60.0000 mL | Freq: Once | CUTANEOUS | Status: DC

## 2014-10-09 MED ORDER — FENTANYL CITRATE (PF) 100 MCG/2ML IJ SOLN
INTRAMUSCULAR | Status: AC
  Filled 2014-10-09: qty 4

## 2014-10-09 MED ORDER — PROPOFOL INFUSION 10 MG/ML OPTIME
INTRAVENOUS | Status: DC | PRN
  Administered 2014-10-09: 75 ug/kg/min via INTRAVENOUS

## 2014-10-09 MED ORDER — LIDOCAINE HCL (CARDIAC) 20 MG/ML IV SOLN
INTRAVENOUS | Status: AC
  Filled 2014-10-09: qty 5

## 2014-10-09 MED ORDER — FENTANYL CITRATE (PF) 100 MCG/2ML IJ SOLN
50.0000 ug | INTRAMUSCULAR | Status: DC | PRN
  Administered 2014-10-09: 50 ug via INTRAVENOUS

## 2014-10-09 SURGICAL SUPPLY — 38 items
BANDAGE ELASTIC 3 VELCRO ST LF (GAUZE/BANDAGES/DRESSINGS) ×5 IMPLANT
BLADE MINI RND TIP GREEN BEAV (BLADE) IMPLANT
BLADE SURG 15 STRL LF DISP TIS (BLADE) ×2 IMPLANT
BLADE SURG 15 STRL SS (BLADE) ×6
BNDG CMPR 9X4 STRL LF SNTH (GAUZE/BANDAGES/DRESSINGS) ×1
BNDG ESMARK 4X9 LF (GAUZE/BANDAGES/DRESSINGS) ×2 IMPLANT
BNDG GAUZE ELAST 4 BULKY (GAUZE/BANDAGES/DRESSINGS) ×3 IMPLANT
CHLORAPREP W/TINT 26ML (MISCELLANEOUS) ×3 IMPLANT
CORDS BIPOLAR (ELECTRODE) ×3 IMPLANT
COVER BACK TABLE 60X90IN (DRAPES) ×3 IMPLANT
COVER MAYO STAND STRL (DRAPES) ×3 IMPLANT
CUFF TOURNIQUET SINGLE 18IN (TOURNIQUET CUFF) ×3 IMPLANT
DRAPE EXTREMITY T 121X128X90 (DRAPE) ×3 IMPLANT
DRAPE SURG 17X23 STRL (DRAPES) ×3 IMPLANT
DRSG PAD ABDOMINAL 8X10 ST (GAUZE/BANDAGES/DRESSINGS) ×3 IMPLANT
GAUZE SPONGE 4X4 12PLY STRL (GAUZE/BANDAGES/DRESSINGS) ×3 IMPLANT
GAUZE XEROFORM 1X8 LF (GAUZE/BANDAGES/DRESSINGS) ×3 IMPLANT
GLOVE BIO SURGEON STRL SZ7 (GLOVE) ×2 IMPLANT
GLOVE BIO SURGEON STRL SZ7.5 (GLOVE) ×3 IMPLANT
GLOVE BIOGEL PI IND STRL 7.0 (GLOVE) IMPLANT
GLOVE BIOGEL PI IND STRL 8 (GLOVE) ×1 IMPLANT
GLOVE BIOGEL PI INDICATOR 7.0 (GLOVE) ×2
GLOVE BIOGEL PI INDICATOR 8 (GLOVE) ×2
GOWN STRL REUS W/ TWL LRG LVL3 (GOWN DISPOSABLE) ×1 IMPLANT
GOWN STRL REUS W/TWL LRG LVL3 (GOWN DISPOSABLE) ×3
GOWN STRL REUS W/TWL XL LVL3 (GOWN DISPOSABLE) ×3 IMPLANT
NDL HYPO 25X1 1.5 SAFETY (NEEDLE) IMPLANT
NEEDLE HYPO 25X1 1.5 SAFETY (NEEDLE) ×3 IMPLANT
NS IRRIG 1000ML POUR BTL (IV SOLUTION) ×3 IMPLANT
PACK BASIN DAY SURGERY FS (CUSTOM PROCEDURE TRAY) ×3 IMPLANT
PADDING CAST ABS 4INX4YD NS (CAST SUPPLIES) ×2
PADDING CAST ABS COTTON 4X4 ST (CAST SUPPLIES) ×1 IMPLANT
STOCKINETTE 4X48 STRL (DRAPES) ×3 IMPLANT
SUT ETHILON 4 0 PS 2 18 (SUTURE) ×3 IMPLANT
SYR BULB 3OZ (MISCELLANEOUS) ×3 IMPLANT
SYR CONTROL 10ML LL (SYRINGE) ×2 IMPLANT
TOWEL OR 17X24 6PK STRL BLUE (TOWEL DISPOSABLE) ×4 IMPLANT
UNDERPAD 30X30 (UNDERPADS AND DIAPERS) ×3 IMPLANT

## 2014-10-09 NOTE — Anesthesia Postprocedure Evaluation (Signed)
Anesthesia Post Note  Patient: [name]  Procedure(s) Performed: Procedure(s) (LRB): LEFT CARPAL TUNNEL RELEASE (Left)  Anesthesia type: MAC  Patient location: PACU  Post pain: Pain level controlled  Post assessment: Patient's Cardiovascular Status Stable  Last Vitals:  Filed Vitals:   10/09/14 1226  BP:   Pulse: 54  Temp:   Resp: 14    Post vital signs: Reviewed and stable  Level of consciousness: sedated  Complications: No apparent anesthesia complications

## 2014-10-09 NOTE — Anesthesia Procedure Notes (Addendum)
Anesthesia Regional Block:  Bier block (IV Regional)  Pre-Anesthetic Checklist: ,, timeout performed, Correct Patient, Correct Site, Correct Laterality, Correct Procedure,, site marked, surgical consent,, at surgeon's request Needles:  Injection technique: Single-shot  Needle Type: Other      Needle Gauge: 20 and 20 G    Additional Needles: Bier block (IV Regional) Narrative:   Performed by: Personally    Procedure Name: MAC Date/Time: 10/09/2014 10:45 AM Performed by: Orvin Netter D Pre-anesthesia Checklist: Patient identified, Emergency Drugs available, Suction available, Patient being monitored and Timeout performed Patient Re-evaluated:Patient Re-evaluated prior to inductionOxygen Delivery Method: Simple face mask

## 2014-10-09 NOTE — Op Note (Signed)
Gregory, Mckee NO.:  [id number]  MEDICAL RECORD NO.:  [id number]  LOCATION:                                 FACILITY:  PHYSICIAN:  [name], MD             DATE OF BIRTH:  DATE OF PROCEDURE:  10/09/2014 DATE OF DISCHARGE:                              OPERATIVE REPORT   PREOPERATIVE DIAGNOSIS:  Left carpal tunnel syndrome.  POSTOPERATIVE DIAGNOSIS:  Left carpal tunnel syndrome.  PROCEDURE:  Left carpal tunnel release.  SURGEON:  [name], MD  ASSISTANT:  None.  ANESTHESIA:  Bier block with sedation.  IV FLUIDS:  Per anesthesia flow sheet.  ESTIMATED BLOOD LOSS:  Minimal.  COMPLICATIONS:  None.  SPECIMENS:  None.  TIME OF TOURNIQUET:  24 minutes.  DISPOSITION:  Stable to PACU.  INDICATIONS:  [name] is a 64 year old male who has had pins and needle sensation in the left hand.  It is bothersome to him.  He has nocturnal symptoms.  Positive nerve conduction studies.  He wished to have a carpal tunnel release for management of symptoms.  Risks, benefits, and alternatives of surgery were discussed including risk of blood loss; infection; damage to nerves, vessels, tendons, ligaments, bone; failure of surgery; need for additional surgery; complications with wound healing; continued pain; recurrence of carpal tunnel syndrome; and damage to motor branch.  He voiced standing of these risks and elected to proceed.  OPERATIVE COURSE:  After being identified preoperatively by myself, the patient and I agreed upon procedure and site of procedure.  Surgical site was marked.  The risks, benefits, and alternatives of surgery were reviewed and he wished to proceed.  Surgical consent had been signed. He was given IV Ancef as preoperative antibiotic prophylaxis.  He was transferred to the operating room and placed on the operating room table in supine position with left upper extremity on arm board.  Bier block anesthesia was induced by  Anesthesiologist.  Left upper extremity was prepped and draped in normal sterile orthopedic fashion.  Surgical pause was performed between surgeons, anesthesia, and operating room staff, and all were in agreement as to the patient, procedure, and site of procedure.  Tourniquet at the proximal aspect of the forearm had been inflated for the Bier block.  Incision was made over the transverse carpal ligament and carried into subcutaneous tissues by spreading technique.  Bipolar electrocautery was used to obtain hemostasis.  The palmar fascia was sharply incised.  The transverse carpal ligament was identified and sharply incised.  It was incised distally first.  Care was taken to ensure complete decompression distally.  It was then incised proximally.  Scissors were used to split the distal aspect of the volar antebrachial fascia.  A finger was placed into the wound to ensure complete decompression which was the case.  The nerve was inspected, it was flat and hyperemic.  Motor branch was identified, it was intact.  The wound was copiously irrigated with sterile saline.  It was closed with 4-0 nylon in horizontal mattress fashion.  It was injected with 10 mL of 0.25% plain Marcaine to aid in postoperative analgesia.  He was then dressed with sterile Xeroform, 4x4s, and ABD and wrapped with Kerlix and Ace bandage.  Tourniquet was deflated at 24 minutes.  Fingertips were pink with brisk capillary refill after deflation of the tourniquet.  The operative drapes were broken down. The patient was awoken from anesthesia safely.  He was transferred back to stretcher and taken to PACU in stable condition.  I will see him back in the office in 1 week for postoperative followup.  I will give him Norco 5/325, 1-2 p.o. q.6 hours p.r.n. pain, dispensed #30.     [name], MD     KK/MEDQ  D:  10/09/2014  T:  10/09/2014  Job:  [id number]

## 2014-10-09 NOTE — Op Note (Signed)
[id number] 

## 2014-10-09 NOTE — Transfer of Care (Signed)
Immediate Anesthesia Transfer of Care Note  Patient: [name]  Procedure(s) Performed: Procedure(s): LEFT CARPAL TUNNEL RELEASE (Left)  Patient Location: PACU  Anesthesia Type:Bier block  Level of Consciousness: awake, alert , oriented and patient cooperative  Airway & Oxygen Therapy: Patient Spontanous Breathing and Patient connected to face mask oxygen  Post-op Assessment: Report given to RN and Post -op Vital signs reviewed and stable  Post vital signs: Reviewed and stable  Last Vitals:  Filed Vitals:   10/09/14 0853  BP: 133/78  Pulse: 64  Temp: 36.6 C  Resp: 20    Complications: No apparent anesthesia complications

## 2014-10-09 NOTE — Brief Op Note (Signed)
10/09/2014  11:21 AM  PATIENT:  [name]  64 y.o. male  PRE-OPERATIVE DIAGNOSIS:  LEFT CARPAL TUNNEL SYNDROME   POST-OPERATIVE DIAGNOSIS:  LEFT CARPAL TUNNEL SYNDROME   PROCEDURE:  Procedure(s): LEFT CARPAL TUNNEL RELEASE (Left)  SURGEON:  Surgeon(s) and Role:    * [name], MD - Primary  PHYSICIAN ASSISTANT:   ASSISTANTS: none   ANESTHESIA:   Bier block with sedation  EBL:  Total I/O In: 1000 [I.V.:1000] Out: -   BLOOD ADMINISTERED:none  DRAINS: none   LOCAL MEDICATIONS USED:  MARCAINE     SPECIMEN:  No Specimen  DISPOSITION OF SPECIMEN:  N/A  COUNTS:  YES  TOURNIQUET:   Total Tourniquet Time Documented: area (laterality) - 24 minutes Total: area (laterality) - 24 minutes   DICTATION: .Other Dictation: Dictation Number [id number]  PLAN OF CARE: Discharge to home after PACU  PATIENT DISPOSITION:  PACU - hemodynamically stable.

## 2014-10-09 NOTE — Progress Notes (Signed)
Pt was to stay the night due to sleep apnea but pt refusing. Spoke with Dr singer and he would like pt to stay for an extra two hours and monitored undisturbed. If pt is stable and doesn't exhibit apnea it would be okay for pt to leave for home but it has to be okay'd by Dr. Fredna Dow.first.

## 2014-10-09 NOTE — H&P (Signed)
[name] is an 64 y.o. male.   Chief Complaint: left carpal tunnel syndrome HPI: 64 yo rhd male with many years of numbness in his left hand.  Nocturnal symptoms wake him nightly.  Positive nerve conduction studies.  He wishes to have a carpal tunnel release for management of his symptoms.  Past Medical History  Diagnosis Date  . Depression   . Myocardial infarct     a. MI in 1994 s/p PTCA alone  . Hypertension   . Nephrolithiasis   . Complication of anesthesia     heart rate dropped twice during surg  . Arthritis   . CAD (coronary artery disease)     a. Mid RCA CTO w/ L-R collateralization, nonobstructive dz elsewhere by 2009 cath  . Obesity   . History of tobacco abuse   . Hyperlipidemia     a. statin, niacin intolerant  . Sleep apnea     does not tol BIPAP or CPAP  . Anxiety     Past Surgical History  Procedure Laterality Date  . Knee surgery    . Back surgery    . Wrist surgery      carpal tunnel  . Cystectomy    . Nose surgery    . Pilonidal cyst excision    . Incision and drainage abscess Right 05/23/2012    Procedure: INCISION AND DRAINAGE ABSCESS;  Surgeon: [name], MD;  Location: [facility];  Service: General;  Laterality: Right;  . Splenectomy    . Cardiac catheterization  1994, 2009    a. PTCA alone in 1994 b. RCA CTO w/ L-R collaterals, 40-50% prox RCA, 20% prox LAD; EF 50-55%, inferoapical HK  . Lower extremity arterial doppler  08/02/2007    Bilateral ABIs-no evidence of arterial insufficiency. Bilateral PVRs demonstrate normal waveforms.  . Cardiac catheterization  04/02/2001    Continue medical therapy.  . Cardiac catheterization  11/07/2000    Continue medical therapy.  . Cardiac catheterization  09/24/1996    Continue medical therapy  . Cardiovascular stress test  04/06/2010    No scintigraphic evidence of inducible myocardial ischemia. No ECG changes. EKG negative for ischemia.  . Transthoracic echocardiogram  03/05/2009    EF >55%, normal LV  wall thickness.  . Anal fistulotomy N/A 06/28/2013    Procedure: ANAL FISTULOTOMY;  Surgeon: [name], MD;  Location: [facility];  Service: General;  Laterality: N/A;    Family History  Problem Relation Age of Onset  . CAD Father     CABG   . CAD Brother     CABG in 38s  . CAD Brother   . Arthritis/Rheumatoid Mother   . Hyperlipidemia Mother   . Thrombocytopenia Mother   . COPD Son     smoker  . Alzheimer's disease Maternal Grandmother   . Lung disease Paternal Grandfather    Social History:  reports that he quit smoking about 24 years ago. His smoking use included Cigarettes. He has a 56 pack-year smoking history. He has quit using smokeless tobacco. His smokeless tobacco use included Chew. He reports that he does not drink alcohol or use illicit drugs.  Allergies:  Allergies  Allergen Reactions  . Niacin And Related Other (See Comments)    flush  . Penicillins   . Statins     myalgias    Medications Prior to Admission  Medication Sig Dispense Refill  . ALPRAZolam (XANAX) 0.5 MG tablet Take 0.5 mg by mouth 4 (four) times daily  as needed for anxiety or sleep.    Marland Kitchen amLODipine (NORVASC) 5 MG tablet Take 1 tablet (5 mg total) by mouth daily. 180 tablet 3  . aspirin 81 MG tablet Take 162 mg by mouth every morning.     . citalopram (CELEXA) 10 MG tablet Take 10 mg by mouth daily.    Marland Kitchen ezetimibe (ZETIA) 10 MG tablet Take 10 mg by mouth daily.    . metoprolol succinate (TOPROL-XL) 50 MG 24 hr tablet Take 50 mg by mouth daily. Take with or immediately following a meal.    . ramipril (ALTACE) 10 MG capsule TAKE 1 CAPSULE DAILY 90 capsule 2  . VIAGRA 100 MG tablet Take 1 tablet by mouth as needed.    Marland Kitchen albuterol (PROVENTIL HFA;VENTOLIN HFA) 108 (90 BASE) MCG/ACT inhaler Inhale 1 puff into the lungs every 6 (six) hours as needed for wheezing or shortness of breath.    . nitroGLYCERIN (NITROSTAT) 0.4 MG SL tablet Place 0.4 mg under the tongue every 5 (five) minutes as needed for chest  pain.    . ramipril (ALTACE) 10 MG capsule TAKE 1 CAPSULE DAILY 90 capsule 2    Results for orders placed or performed during the hospital encounter of 10/09/14 (from the past 48 hour(s))  Basic metabolic panel     Status: None   Collection Time: 10/07/14 12:00 PM  Result Value Ref Range   Sodium 138 135 - 145 mmol/L   Potassium 4.6 3.5 - 5.1 mmol/L   Chloride 106 101 - 111 mmol/L   CO2 25 22 - 32 mmol/L   Glucose, Bld 78 65 - 99 mg/dL   BUN 9 6 - 20 mg/dL   Creatinine, Ser 0.75 0.61 - 1.24 mg/dL   Calcium 9.2 8.9 - 10.3 mg/dL   GFR calc non Af Amer >60 >60 mL/min   GFR calc Af Amer >60 >60 mL/min    Comment: (NOTE) The eGFR has been calculated using the CKD EPI equation. This calculation has not been validated in all clinical situations. eGFR's persistently <60 mL/min signify possible Chronic Kidney Disease.    Anion gap 7 5 - 15  Hemoglobin-hemacue, POC     Status: None   Collection Time: 10/09/14  9:15 AM  Result Value Ref Range   Hemoglobin 14.8 13.0 - 17.0 g/dL    No results found.   A comprehensive review of systems was negative except for: Eyes: positive for contacts/glasses Ears, nose, mouth, throat, and face: positive for hearing loss and tinnitus Respiratory: positive for shortness of breath Integument/breast: positive for rash Hematologic/lymphatic: positive for easy bruising Musculoskeletal: positive for arthralgias and back pain Neurological: positive for headaches  Blood pressure 133/78, pulse 64, temperature 97.8 F (36.6 C), temperature source Oral, resp. rate 20, height '5\' 9"'  (1.753 m), weight 102.967 kg (227 lb), SpO2 95 %.  General appearance: alert, cooperative and appears stated age Head: Normocephalic, without obvious abnormality, atraumatic Neck: supple, symmetrical, trachea midline Resp: clear to auscultation bilaterally Cardio: regular rate and rhythm GI: non tender Extremities: intact sensation and capillary refill all digits.  +epl/fpl/io.   no wounds. Pulses: 2+ and symmetric Skin: Skin color, texture, turgor normal. No rashes or lesions Neurologic: Grossly normal Incision/Wound: none  Assessment/Plan Left carpal tunnel syndrome.  Non operative and operative treatment options were discussed with the patient and patient wishes to proceed with operative treatment. Risks, benefits, and alternatives of surgery were discussed and the patient agrees with the plan of care.   Theus Espin R  10/09/2014, 10:02 AM

## 2014-10-09 NOTE — Discharge Instructions (Addendum)
[facility] Instructions °Hand Surgery ° °Wound Care: °Keep your hand elevated above the level of your heart.  Do not allow it to dangle by your side.  Keep the dressing dry and do not remove it unless your doctor advises you to do so.  He will usually change it at the time of your post-op visit.  Moving your fingers is advised to stimulate circulation but will depend on the site of your surgery.  If you have a splint applied, your doctor will advise you regarding movement. ° °Activity: °Do not drive or operate machinery today.  Rest today and then you may return to your normal activity and work as indicated by your physician. ° °Diet:  °Drink liquids today or eat a light diet.  You may resume a regular diet tomorrow.   ° °General expectations: °Pain for two to three days. °Fingers may become slightly swollen. ° °Call your doctor if any of the following occur: °Severe pain not relieved by pain medication. °Elevated temperature. °Dressing soaked with blood. °Inability to move fingers. °White or bluish color to fingers. ° ° °Post Anesthesia Home Care Instructions ° °Activity: °Get plenty of rest for the remainder of the day. A responsible adult should stay with you for 24 hours following the procedure.  °For the next 24 hours, DO NOT: °-Drive a car °-Operate machinery °-Drink alcoholic beverages °-Take any medication unless instructed by your physician °-Make any legal decisions or sign important papers. ° °Meals: °Start with liquid foods such as gelatin or soup. Progress to regular foods as tolerated. Avoid greasy, spicy, heavy foods. If nausea and/or vomiting occur, drink only clear liquids until the nausea and/or vomiting subsides. Call your physician if vomiting continues. ° °Special Instructions/Symptoms: °Your throat may feel dry or sore from the anesthesia or the breathing tube placed in your throat during surgery. If this causes discomfort, gargle with warm salt water. The discomfort should disappear within 24  hours. ° °If you had a scopolamine patch placed behind your ear for the management of post- operative nausea and/or vomiting: ° °1. The medication in the patch is effective for 72 hours, after which it should be removed.  Wrap patch in a tissue and discard in the trash. Wash hands thoroughly with soap and water. °2. You may remove the patch earlier than 72 hours if you experience unpleasant side effects which may include dry mouth, dizziness or visual disturbances. °3. Avoid touching the patch. Wash your hands with soap and water after contact with the patch. °  °Call your surgeon if you experience:  ° °1.  Fever over 101.0. °2.  Inability to urinate. °3.  Nausea and/or vomiting. °4.  Extreme swelling or bruising at the surgical site. °5.  Continued bleeding from the incision. °6.  Increased pain, redness or drainage from the incision. °7.  Problems related to your pain medication. °8. Any change in color, movement and/or sensation °9. Any problems and/or concerns ° ° °

## 2014-10-09 NOTE — Anesthesia Preprocedure Evaluation (Addendum)
Anesthesia Evaluation  Patient identified by MRN, date of birth, ID band Patient awake    Reviewed: Allergy & Precautions, NPO status , Patient's Chart, lab work & pertinent test results  History of Anesthesia Complications Negative for: history of anesthetic complications  Airway Mallampati: II  TM Distance: >3 FB Neck ROM: Full    Dental  (+) Teeth Intact, Dental Advisory Given   Pulmonary sleep apnea , former smoker,    Pulmonary exam normal       Cardiovascular hypertension, Pt. on medications and Pt. on home beta blockers + CAD and + Past MI Normal cardiovascular exam Study Conclusions  - Left ventricle: The cavity size was normal. Wall thickness was increased in a pattern of mild LVH. There was mild concentric hypertrophy. Systolic function was normal. The estimated ejection fraction was in the range of 55% to 60%. Mild hypokinesis of the inferolateral myocardium. Left ventricular diastolic function parameters were normal. - Mitral valve: Mild regurgitation. - Atrial septum: No defect or patent foramen ovale was identified.    Neuro/Psych PSYCHIATRIC DISORDERS Anxiety Depression    GI/Hepatic negative GI ROS, Neg liver ROS,   Endo/Other  negative endocrine ROS  Renal/GU negative Renal ROS     Musculoskeletal  (+) Arthritis -,   Abdominal   Peds  Hematology   Anesthesia Other Findings   Reproductive/Obstetrics                          Anesthesia Physical Anesthesia Plan  ASA: III  Anesthesia Plan: MAC and Bier Block   Post-op Pain Management:    Induction:   Airway Management Planned: Simple Face Mask  Additional Equipment:   Intra-op Plan:   Post-operative Plan:   Informed Consent: I have reviewed the patients History and Physical, chart, labs and discussed the procedure including the risks, benefits and alternatives for the proposed anesthesia with the  patient or authorized representative who has indicated his/her understanding and acceptance.   Dental advisory given  Plan Discussed with: Anesthesiologist  Anesthesia Plan Comments:        Anesthesia Quick Evaluation

## 2014-10-10 ENCOUNTER — Encounter (HOSPITAL_BASED_OUTPATIENT_CLINIC_OR_DEPARTMENT_OTHER): Payer: Self-pay | Admitting: Orthopedic Surgery

## 2014-10-16 ENCOUNTER — Ambulatory Visit (INDEPENDENT_AMBULATORY_CARE_PROVIDER_SITE_OTHER): Payer: 59 | Admitting: Otolaryngology

## 2014-10-16 DIAGNOSIS — H903 Sensorineural hearing loss, bilateral: Secondary | ICD-10-CM

## 2014-10-16 DIAGNOSIS — H6123 Impacted cerumen, bilateral: Secondary | ICD-10-CM | POA: Diagnosis not present

## 2014-10-21 ENCOUNTER — Ambulatory Visit (INDEPENDENT_AMBULATORY_CARE_PROVIDER_SITE_OTHER): Payer: 59 | Admitting: Nurse Practitioner

## 2014-10-21 ENCOUNTER — Encounter: Payer: Self-pay | Admitting: Nurse Practitioner

## 2014-10-21 ENCOUNTER — Other Ambulatory Visit: Payer: Self-pay

## 2014-10-21 VITALS — BP 138/87 | HR 64 | Temp 97.6°F | Ht 69.0 in | Wt 226.0 lb

## 2014-10-21 DIAGNOSIS — K625 Hemorrhage of anus and rectum: Secondary | ICD-10-CM

## 2014-10-21 DIAGNOSIS — R194 Change in bowel habit: Secondary | ICD-10-CM

## 2014-10-21 MED ORDER — PEG-KCL-NACL-NASULF-NA ASC-C 100 G PO SOLR: 1.0000 | ORAL | Status: DC

## 2014-10-21 NOTE — Assessment & Plan Note (Signed)
Patient with bowel habit changes including fecal urgency and diarrhea which she associates with consuming dairy. It is likely lactose intolerance could be contributing to the symptoms. When he is not consuming dairy does not have a problem. However, given the fact he is never had a colonoscopy and his other presentation symptoms we will proceed with a colonoscopy as noted above. We'll advise him to avoid dairy as well. Return for follow-up in 3 months.

## 2014-10-21 NOTE — Progress Notes (Signed)
Primary Care Physician:  [name]., MD Primary Gastroenterologist:  [name]  Chief Complaint  Patient presents with  . set up TCS  . Rectal Bleeding    HPI:   64 year old male referred by PCP for rectal bleeding and change in bowel habits. PCP notes reviewed. There is a question of whether colonoscopy was previously done and another location in 2012 however her phone notes on the front of the PCP office note patient states only GI solids [name] which was just for an EGD. No colonoscopy or endoscopy notes found in our system. It is possible he is never had a colonoscopy. He has a history of anal fistula which was repaired surgically by [name] in 2015.  Today he states they thought they had had a colonoscopy in 2012, but no record could be found. They have not seen a GI provider that they know of. He had a cyst on his buttocks, which was surgically removed in 2015. States every now and then it'll return and burst. Has also had an anal fistula which he feels may also be potentially contributing. Denies rectal pain other than occasionally when wiping. When he does have rectal bleeding it is on the toilet tissue, water, and stool. His bowel habit changes include onset of profuse diarrhea without abdominal pain which he finds occurs after consuming dairy. Has had a couple incidences of LLQ and left mid abdominal pain which is described as stabbing and lasts a couple seconds and self-resolves. Has a bowel movement previously 2-3 times a day, has been having constipation due to pain medication related to carpel tunnel surgery. Is on stool softener. Carpel Tunnel surgery was 2 weeks ago. Denies fever, chills, unintentional weight loss. Deneis melena. Denies chest pain, dizziness, lightheadedness, syncope, near syncope. Denies any other upper or lower GI symptoms.   Past Medical History  Diagnosis Date  . Depression   . Myocardial infarct     a. MI in 1994 s/p PTCA alone  .  Hypertension   . Nephrolithiasis   . Complication of anesthesia     heart rate dropped twice during surg  . Arthritis   . CAD (coronary artery disease)     a. Mid RCA CTO w/ L-R collateralization, nonobstructive dz elsewhere by 2009 cath  . Obesity   . History of tobacco abuse   . Hyperlipidemia     a. statin, niacin intolerant  . Sleep apnea     does not tol BIPAP or CPAP  . Anxiety     Past Surgical History  Procedure Laterality Date  . Knee surgery    . Back surgery    . Wrist surgery      carpal tunnel  . Cystectomy    . Nose surgery    . Pilonidal cyst excision    . Incision and drainage abscess Right 05/23/2012    Procedure: INCISION AND DRAINAGE ABSCESS;  Surgeon: [name], MD;  Location: [facility];  Service: General;  Laterality: Right;  . Splenectomy    . Cardiac catheterization  1994, 2009    a. PTCA alone in 1994 b. RCA CTO w/ L-R collaterals, 40-50% prox RCA, 20% prox LAD; EF 50-55%, inferoapical HK  . Lower extremity arterial doppler  08/02/2007    Bilateral ABIs-no evidence of arterial insufficiency. Bilateral PVRs demonstrate normal waveforms.  . Cardiac catheterization  04/02/2001    Continue medical therapy.  . Cardiac catheterization  11/07/2000    Continue medical therapy.  Marland Kitchen  Cardiac catheterization  09/24/1996    Continue medical therapy  . Cardiovascular stress test  04/06/2010    No scintigraphic evidence of inducible myocardial ischemia. No ECG changes. EKG negative for ischemia.  . Transthoracic echocardiogram  03/05/2009    EF >55%, normal LV wall thickness.  . Anal fistulotomy N/A 06/28/2013    Procedure: ANAL FISTULOTOMY;  Surgeon: [name], MD;  Location: [facility];  Service: General;  Laterality: N/A;  . Carpal tunnel release Left 10/09/2014    Procedure: LEFT CARPAL TUNNEL RELEASE;  Surgeon: [name], MD;  Location: [facility];  Service: Orthopedics;  Laterality: Left;    Current Outpatient Prescriptions  Medication Sig  Dispense Refill  . albuterol (PROVENTIL HFA;VENTOLIN HFA) 108 (90 BASE) MCG/ACT inhaler Inhale 1 puff into the lungs every 6 (six) hours as needed for wheezing or shortness of breath.    . ALPRAZolam (XANAX) 0.5 MG tablet Take 0.5 mg by mouth 4 (four) times daily as needed for anxiety or sleep.    Marland Kitchen amLODipine (NORVASC) 5 MG tablet Take 1 tablet (5 mg total) by mouth daily. 180 tablet 3  . aspirin 81 MG tablet Take 162 mg by mouth every morning.     . citalopram (CELEXA) 10 MG tablet Take 10 mg by mouth daily.    Marland Kitchen ezetimibe (ZETIA) 10 MG tablet Take 10 mg by mouth daily.    . metoprolol succinate (TOPROL-XL) 50 MG 24 hr tablet Take 50 mg by mouth daily. Take with or immediately following a meal.    . nitroGLYCERIN (NITROSTAT) 0.4 MG SL tablet Place 0.4 mg under the tongue every 5 (five) minutes as needed for chest pain.    . ramipril (ALTACE) 10 MG capsule TAKE 1 CAPSULE DAILY 90 capsule 2  . VIAGRA 100 MG tablet Take 1 tablet by mouth as needed.    Marland Kitchen HYDROcodone-acetaminophen (NORCO) 5-325 MG per tablet 1-2 tabs po q6 hours prn pain (Patient not taking: Reported on 10/21/2014) 30 tablet 0   No current facility-administered medications for this visit.    Allergies as of 10/21/2014 - Review Complete 10/21/2014  Allergen Reaction Noted  . Niacin and related Other (See Comments) 05/22/2012  . Penicillins    . Statins  03/28/2013    Family History  Problem Relation Age of Onset  . CAD Father     CABG   . CAD Brother     CABG in 68s  . CAD Brother   . Arthritis/Rheumatoid Mother   . Hyperlipidemia Mother   . Thrombocytopenia Mother   . COPD Son     smoker  . Alzheimer's disease Maternal Grandmother   . Lung disease Paternal Grandfather     Social History   Social History  . Marital Status: Married    Spouse Name: N/A  . Number of Children: N/A  . Years of Education: N/A   Occupational History  . Not on file.   Social History Main Topics  . Smoking status: Former Smoker  -- 2.00 packs/day for 28 years    Types: Cigarettes    Quit date: 03/28/1990  . Smokeless tobacco: Former Systems developer    Types: Chew  . Alcohol Use: No  . Drug Use: No  . Sexual Activity: Yes   Other Topics Concern  . Not on file   Social History Narrative   Lives in [place], [name]. Married, 2 children.     Review of Systems: General: Negative for anorexia, weight loss, fever, chills, fatigue, weakness.  Eyes: Negative for vision changes.  ENT: Negative for hoarseness, difficulty swallowing. CV: Negative for chest pain, angina, palpitations, peripheral edema.  Respiratory: Negative for dyspnea at rest, cough, sputum, wheezing.  GI: See history of present illness. Derm: Negative for rash or itching.  Endo: Negative for unusual weight change.  Heme: Negative for bruising or bleeding. Allergy: Negative for rash or hives.    Physical Exam: BP 138/87 mmHg  Pulse 64  Temp(Src) 97.6 F (36.4 C)  Ht 5\' 9"  (1.753 m)  Wt 226 lb (102.513 kg)  BMI 33.36 kg/m2 General:   Alert and oriented. Pleasant and cooperative. Well-nourished and well-developed.  Head:  Normocephalic and atraumatic. Eyes:  Without icterus, sclera clear and conjunctiva pink.  Ears:  Normal auditory acuity. Cardiovascular:  S1, S2 present without murmurs appreciated. Normal pulses noted. Extremities without clubbing or edema. Respiratory:  Clear to auscultation bilaterally. No wheezes, rales, or rhonchi. No distress.  Gastrointestinal:  +BS, rounded, soft, non-tender and non-distended. No HSM noted. No guarding or rebound. No masses appreciated.  Rectal:  Deferred  Skin:  Intact without significant lesions or rashes. Neurologic:  Alert and oriented x4;  grossly normal neurologically. Psych:  Alert and cooperative. Normal mood and affect. Heme/Lymph/Immune: No excessive bruising noted.    10/21/2014 9:02 AM

## 2014-10-21 NOTE — Progress Notes (Signed)
CC'D TO PCP °

## 2014-10-21 NOTE — Assessment & Plan Note (Signed)
Patient with rectal bleeding as of late in addition to bowel habit changes. He has a history of anal fissure which was surgically repaired as well as Botox boils which occasionally returned. He feels these are likely a source of bleeding. Denies any melena. No other red flag/warning signs or symptoms. Likely benign anorectal source, however he has never had a colonoscopy that we can find or that they can find. Cannot rule out other more insidious process. We will proceed with colonoscopy for further evaluation.  Proceed with TCS with 25mg  phenergan with [name] in near future: the risks, benefits, and alternatives have been discussed with the patient in detail. The patient states understanding and desires to proceed.  He is not on any anticoagulants, chronic pain medications, antidepressants. He was briefly on pain medicine status post carpal tunnel surgery but this completed. He does take a low-dose Xanax at night with sleep. For this reason we'll complete the procedure with conscious sedation and 25 mg Phenergan.

## 2014-10-21 NOTE — Patient Instructions (Signed)
1. Try to avoid dairy as this seems to be contributing to your bowel habit changes. 2. We'll schedule your procedure for you. 3. Return follow-up in 3 months.

## 2014-11-06 ENCOUNTER — Encounter (HOSPITAL_COMMUNITY)
Admission: RE | Disposition: A | Discharge status: Home / Self Care | Payer: Self-pay | Source: Ambulatory Visit | Attending: Internal Medicine

## 2014-11-06 ENCOUNTER — Encounter (HOSPITAL_COMMUNITY): Payer: Self-pay

## 2014-11-06 ENCOUNTER — Ambulatory Visit (HOSPITAL_COMMUNITY)
Admission: RE | Admit: 2014-11-06 | Discharge: 2014-11-06 | Disposition: A | Discharge status: Home / Self Care | Payer: 59 | Source: Ambulatory Visit | Attending: Internal Medicine | Admitting: Internal Medicine

## 2014-11-06 DIAGNOSIS — Z7982 Long term (current) use of aspirin: Secondary | ICD-10-CM | POA: Diagnosis not present

## 2014-11-06 DIAGNOSIS — Z79899 Other long term (current) drug therapy: Secondary | ICD-10-CM | POA: Insufficient documentation

## 2014-11-06 DIAGNOSIS — R1032 Left lower quadrant pain: Secondary | ICD-10-CM | POA: Diagnosis not present

## 2014-11-06 DIAGNOSIS — R194 Change in bowel habit: Secondary | ICD-10-CM | POA: Insufficient documentation

## 2014-11-06 DIAGNOSIS — G4730 Sleep apnea, unspecified: Secondary | ICD-10-CM | POA: Insufficient documentation

## 2014-11-06 DIAGNOSIS — Z6833 Body mass index (BMI) 33.0-33.9, adult: Secondary | ICD-10-CM | POA: Insufficient documentation

## 2014-11-06 DIAGNOSIS — K921 Melena: Secondary | ICD-10-CM | POA: Diagnosis not present

## 2014-11-06 DIAGNOSIS — E669 Obesity, unspecified: Secondary | ICD-10-CM | POA: Insufficient documentation

## 2014-11-06 DIAGNOSIS — K529 Noninfective gastroenteritis and colitis, unspecified: Secondary | ICD-10-CM | POA: Insufficient documentation

## 2014-11-06 DIAGNOSIS — E785 Hyperlipidemia, unspecified: Secondary | ICD-10-CM | POA: Insufficient documentation

## 2014-11-06 DIAGNOSIS — K633 Ulcer of intestine: Secondary | ICD-10-CM

## 2014-11-06 DIAGNOSIS — Z87891 Personal history of nicotine dependence: Secondary | ICD-10-CM | POA: Diagnosis not present

## 2014-11-06 DIAGNOSIS — F418 Other specified anxiety disorders: Secondary | ICD-10-CM | POA: Insufficient documentation

## 2014-11-06 DIAGNOSIS — I10 Essential (primary) hypertension: Secondary | ICD-10-CM | POA: Diagnosis not present

## 2014-11-06 DIAGNOSIS — K625 Hemorrhage of anus and rectum: Secondary | ICD-10-CM

## 2014-11-06 HISTORY — PX: COLONOSCOPY: SHX5424

## 2014-11-06 SURGERY — COLONOSCOPY
Anesthesia: Moderate Sedation

## 2014-11-06 MED ORDER — MIDAZOLAM HCL 5 MG/5ML IJ SOLN
INTRAMUSCULAR | Status: DC | PRN
  Administered 2014-11-06 (×3): 1 mg via INTRAVENOUS
  Administered 2014-11-06: 2 mg via INTRAVENOUS

## 2014-11-06 MED ORDER — ONDANSETRON HCL 4 MG/2ML IJ SOLN
INTRAMUSCULAR | Status: AC
  Filled 2014-11-06: qty 2

## 2014-11-06 MED ORDER — MIDAZOLAM HCL 5 MG/5ML IJ SOLN
INTRAMUSCULAR | Status: AC
  Filled 2014-11-06: qty 10

## 2014-11-06 MED ORDER — PROMETHAZINE HCL 25 MG/ML IJ SOLN
25.0000 mg | Freq: Once | INTRAMUSCULAR | Status: AC
  Administered 2014-11-06: 25 mg via INTRAVENOUS

## 2014-11-06 MED ORDER — SODIUM CHLORIDE 0.9 % IV SOLN
INTRAVENOUS | Status: DC
  Administered 2014-11-06: 14:00:00 via INTRAVENOUS

## 2014-11-06 MED ORDER — MEPERIDINE HCL 100 MG/ML IJ SOLN
INTRAMUSCULAR | Status: DC | PRN
  Administered 2014-11-06 (×2): 25 mg via INTRAVENOUS
  Administered 2014-11-06: 50 mg via INTRAVENOUS

## 2014-11-06 MED ORDER — SODIUM CHLORIDE 0.9 % IJ SOLN
INTRAMUSCULAR | Status: AC
  Filled 2014-11-06: qty 3

## 2014-11-06 MED ORDER — MEPERIDINE HCL 100 MG/ML IJ SOLN
INTRAMUSCULAR | Status: AC
  Filled 2014-11-06: qty 2

## 2014-11-06 MED ORDER — PROMETHAZINE HCL 25 MG/ML IJ SOLN
INTRAMUSCULAR | Status: AC
  Filled 2014-11-06: qty 1

## 2014-11-06 MED ORDER — STERILE WATER FOR IRRIGATION IR SOLN
Status: DC | PRN
  Administered 2014-11-06: 15:00:00

## 2014-11-06 MED ORDER — ONDANSETRON HCL 4 MG/2ML IJ SOLN
INTRAMUSCULAR | Status: DC | PRN
  Administered 2014-11-06: 4 mg via INTRAVENOUS

## 2014-11-06 NOTE — Interval H&P Note (Signed)
History and Physical Interval Note:  11/06/2014 2:39 PM  [name]  has presented today for surgery, with the diagnosis of RECTAL BLEEDING  The various methods of treatment have been discussed with the patient and family. After consideration of risks, benefits and other options for treatment, the patient has consented to  Procedure(s) with comments: COLONOSCOPY (N/A) - 200 - moved to 2:45 - office notified pt as a surgical intervention .  The patient's history has been reviewed, patient examined, no change in status, stable for surgery.  I have reviewed the patient's chart and labs.  Questions were answered to the patient's satisfaction.     [name]  No change. Diagnostic colonoscopy per plan.  The risks, benefits, limitations, alternatives and imponderables have been reviewed with the patient. Questions have been answered. All parties are agreeable.

## 2014-11-06 NOTE — Discharge Instructions (Signed)
Colonoscopy Discharge Instructions  Read the instructions outlined below and refer to this sheet in the next few weeks. These discharge instructions provide you with general information on caring for yourself after you leave the hospital. Your doctor may also give you specific instructions. While your treatment has been planned according to the most current medical practices available, unavoidable complications occasionally occur. If you have any problems or questions after discharge, call [name] at [phone]. ACTIVITY  You may resume your regular activity, but move at a slower pace for the next 24 hours.   Take frequent rest periods for the next 24 hours.   Walking will help get rid of the air and reduce the bloated feeling in your belly (abdomen).   No driving for 24 hours (because of the medicine (anesthesia) used during the test).    Do not sign any important legal documents or operate any machinery for 24 hours (because of the anesthesia used during the test).  NUTRITION  Drink plenty of fluids.   You may resume your normal diet as instructed by your doctor.   Begin with a light meal and progress to your normal diet. Heavy or fried foods are harder to digest and may make you feel sick to your stomach (nauseated).   Avoid alcoholic beverages for 24 hours or as instructed.  MEDICATIONS  You may resume your normal medications unless your doctor tells you otherwise.  WHAT YOU CAN EXPECT TODAY  Some feelings of bloating in the abdomen.   Passage of more gas than usual.   Spotting of blood in your stool or on the toilet paper.  IF YOU HAD POLYPS REMOVED DURING THE COLONOSCOPY:  No aspirin products for 7 days or as instructed.   No alcohol for 7 days or as instructed.   Eat a soft diet for the next 24 hours.  FINDING OUT THE RESULTS OF YOUR TEST Not all test results are available during your visit. If your test results are not back during the visit, make an appointment  with your caregiver to find out the results. Do not assume everything is normal if you have not heard from your caregiver or the medical facility. It is important for you to follow up on all of your test results.  SEEK IMMEDIATE MEDICAL ATTENTION IF:  You have more than a spotting of blood in your stool.   Your belly is swollen (abdominal distention).   You are nauseated or vomiting.   You have a temperature over 101.   You have abdominal pain or discomfort that is severe or gets worse throughout the day.    Your colon was abnormal (ulcers). Biopsies performed.  Diverticulosis information provided  Schedule a CT enterography of the abdomen and pelvis to further evaluate right colon mass and ileocecal valve, right colon ulcerations  Further recommendations to follow in the near future.     Diverticulosis Diverticulosis is the condition that develops when small pouches (diverticula) form in the wall of your colon. Your colon, or large intestine, is where water is absorbed and stool is formed. The pouches form when the inside layer of your colon pushes through weak spots in the outer layers of your colon. CAUSES  No one knows exactly what causes diverticulosis. RISK FACTORS  Being older than 75. Your risk for this condition increases with age. Diverticulosis is rare in people younger than 40 years. By age 60, almost everyone has it.  Eating a low-fiber diet.  Being frequently constipated.  Being  overweight.  Not getting enough exercise.  Smoking.  Taking over-the-counter pain medicines, like aspirin and ibuprofen. SYMPTOMS  Most people with diverticulosis do not have symptoms. DIAGNOSIS  Because diverticulosis often has no symptoms, health care providers often discover the condition during an exam for other colon problems. In many cases, a health care provider will diagnose diverticulosis while using a flexible scope to examine the colon (colonoscopy). TREATMENT  If you  have never developed an infection related to diverticulosis, you may not need treatment. If you have had an infection before, treatment may include:  Eating more fruits, vegetables, and grains.  Taking a fiber supplement.  Taking a live bacteria supplement (probiotic).  Taking medicine to relax your colon. HOME CARE INSTRUCTIONS   Drink at least 6-8 glasses of water each day to prevent constipation.  Try not to strain when you have a bowel movement.  Keep all follow-up appointments. If you have had an infection before:  Increase the fiber in your diet as directed by your health care provider or dietitian.  Take a dietary fiber supplement if your health care provider approves.  Only take medicines as directed by your health care provider. SEEK MEDICAL CARE IF:   You have abdominal pain.  You have bloating.  You have cramps.  You have not gone to the bathroom in 3 days. SEEK IMMEDIATE MEDICAL CARE IF:   Your pain gets worse.  Yourbloating becomes very bad.  You have a fever or chills, and your symptoms suddenly get worse.  You begin vomiting.  You have bowel movements that are bloody or black. MAKE SURE YOU:  Understand these instructions.  Will watch your condition.  Will get help right away if you are not doing well or get worse. Document Released: 10/22/2003 Document Revised: 01/29/2013 Document Reviewed: 12/19/2012 [facility] Patient Information 2015 [place], [place]. This information is not intended to replace advice given to you by your health care provider. Make sure you discuss any questions you have with your health care provider.

## 2014-11-06 NOTE — Op Note (Signed)
[facility] [address] [place], [address]   COLONOSCOPY PROCEDURE REPORT  PATIENT: Gregory, Mckee  MR#: [id number] BIRTHDATE: 07/09/1950 , 52  yrs. old GENDER: male ENDOSCOPIST: R.  Garfield Cornea, MD FACP [facility] REFERRED XB:JYNWG Gerarda Fraction, M.D. PROCEDURE DATE:  11-29-2014 PROCEDURE:   Colonoscopy with biopsy INDICATIONS:hematochezia. MEDICATIONS: Versed 5 mg IV and Demerol 100 mg IV in divided doses. Zofran 4 mg IV.  Phenergan 25 mg IV ASA CLASS:       Class III  CONSENT: The risks, benefits, alternatives and imponderables including but not limited to bleeding, perforation as well as the possibility of a missed lesion have been reviewed.  The potential for biopsy, lesion removal, etc. have also been discussed. Questions have been answered.  All parties agreeable.  Please see the history and physical in the medical record for more information.  DESCRIPTION OF PROCEDURE:   After the risks benefits and alternatives of the procedure were thoroughly explained, informed consent was obtained.  The digital rectal exam revealed no abnormalities of the rectum.   The EC-[id number] ([id number])  endoscope was introduced through the anus and advanced to the cecum, which was identified by both the appendix and ileocecal valve. No adverse events experienced.   The quality of the prep was adequate  The instrument was then slowly withdrawn as the colon was fully examined. Estimated blood loss is zero unless otherwise noted in this procedure report.      COLON FINDINGS: Normal-appearing rectal mucosa.  Few scattered left-sided diverticula.  Patient multiple 1-2 mm ulcer scattered throughout the sigmoid segment.  From the sigmoid on into the ascending segment; the mucosa appeared normal.  However, in the mid ascending colon all the way to the ileocecal valve, the patient had multiple 2-5 mm areas of ulceration scattered  to the ileocecal valve; ICV was fixed, stenotic and  particularly ulcerated.  I was unable to intubate it; just distal to the ileocecal valve there is a 3 cm soft submucosal mass.  Please see photos.  Ulcerations overlying it as well.  Positive pillow sign.  Biopsies of the ulcerated ascending colon mucosa as well as the ileocecal valve as well as the sigmoid abnormalities taken. Retroflexion was performed. .  Withdrawal time=14 minutes 0 seconds.  The scope was withdrawn and the procedure completed. COMPLICATIONS: There were no immediate complications.  ENDOSCOPIC IMPRESSION: Skipped colonic ulcerations with significant ileocecal valve involvement and rectal sparing most consistent with Crohn's disease. Minimal non-steroidal drug use. Probable colonic lipoma  RECOMMENDATIONS: CT enterography to further evaluate abnormal colon and ileocecal valve. Follow-up on pathology.  eSigned:  R. Garfield Cornea, MD Rosalita Chessman [facility] 11/29/2014 3:28 PM   cc:  CPT CODES: ICD CODES:  The ICD and CPT codes recommended by this software are interpretations from the data that the clinical staff has captured with the software.  The verification of the translation of this report to the ICD and CPT codes and modifiers is the sole responsibility of the health care institution and practicing physician where this report was generated.  [employer] will not be held responsible for the validity of the ICD and CPT codes included on this report.  AMA assumes no liability for data contained or not contained herein. CPT is a Designer, television/film set of the [employer].  PATIENT NAME:  Gregory, Mckee MR#: [id number]

## 2014-11-06 NOTE — H&P (View-Only) (Signed)
Primary Care Physician:  [name]., MD Primary Gastroenterologist:  [name]  Chief Complaint  Patient presents with  . set up TCS  . Rectal Bleeding    HPI:   64 year old male referred by PCP for rectal bleeding and change in bowel habits. PCP notes reviewed. There is a question of whether colonoscopy was previously done and another location in 2012 however her phone notes on the front of the PCP office note patient states only GI solids [name] which was just for an EGD. No colonoscopy or endoscopy notes found in our system. It is possible he is never had a colonoscopy. He has a history of anal fistula which was repaired surgically by [name] in 2015.  Today he states they thought they had had a colonoscopy in 2012, but no record could be found. They have not seen a GI provider that they know of. He had a cyst on his buttocks, which was surgically removed in 2015. States every now and then it'll return and burst. Has also had an anal fistula which he feels may also be potentially contributing. Denies rectal pain other than occasionally when wiping. When he does have rectal bleeding it is on the toilet tissue, water, and stool. His bowel habit changes include onset of profuse diarrhea without abdominal pain which he finds occurs after consuming dairy. Has had a couple incidences of LLQ and left mid abdominal pain which is described as stabbing and lasts a couple seconds and self-resolves. Has a bowel movement previously 2-3 times a day, has been having constipation due to pain medication related to carpel tunnel surgery. Is on stool softener. Carpel Tunnel surgery was 2 weeks ago. Denies fever, chills, unintentional weight loss. Deneis melena. Denies chest pain, dizziness, lightheadedness, syncope, near syncope. Denies any other upper or lower GI symptoms.   Past Medical History  Diagnosis Date  . Depression   . Myocardial infarct     a. MI in 1994 s/p PTCA alone  .  Hypertension   . Nephrolithiasis   . Complication of anesthesia     heart rate dropped twice during surg  . Arthritis   . CAD (coronary artery disease)     a. Mid RCA CTO w/ L-R collateralization, nonobstructive dz elsewhere by 2009 cath  . Obesity   . History of tobacco abuse   . Hyperlipidemia     a. statin, niacin intolerant  . Sleep apnea     does not tol BIPAP or CPAP  . Anxiety     Past Surgical History  Procedure Laterality Date  . Knee surgery    . Back surgery    . Wrist surgery      carpal tunnel  . Cystectomy    . Nose surgery    . Pilonidal cyst excision    . Incision and drainage abscess Right 05/23/2012    Procedure: INCISION AND DRAINAGE ABSCESS;  Surgeon: [name], MD;  Location: [facility];  Service: General;  Laterality: Right;  . Splenectomy    . Cardiac catheterization  1994, 2009    a. PTCA alone in 1994 b. RCA CTO w/ L-R collaterals, 40-50% prox RCA, 20% prox LAD; EF 50-55%, inferoapical HK  . Lower extremity arterial doppler  08/02/2007    Bilateral ABIs-no evidence of arterial insufficiency. Bilateral PVRs demonstrate normal waveforms.  . Cardiac catheterization  04/02/2001    Continue medical therapy.  . Cardiac catheterization  11/07/2000    Continue medical therapy.  Marland Kitchen  Cardiac catheterization  09/24/1996    Continue medical therapy  . Cardiovascular stress test  04/06/2010    No scintigraphic evidence of inducible myocardial ischemia. No ECG changes. EKG negative for ischemia.  . Transthoracic echocardiogram  03/05/2009    EF >55%, normal LV wall thickness.  . Anal fistulotomy N/A 06/28/2013    Procedure: ANAL FISTULOTOMY;  Surgeon: [name], MD;  Location: [facility];  Service: General;  Laterality: N/A;  . Carpal tunnel release Left 10/09/2014    Procedure: LEFT CARPAL TUNNEL RELEASE;  Surgeon: [name], MD;  Location: [place];  Service: Orthopedics;  Laterality: Left;    Current Outpatient Prescriptions  Medication Sig  Dispense Refill  . albuterol (PROVENTIL HFA;VENTOLIN HFA) 108 (90 BASE) MCG/ACT inhaler Inhale 1 puff into the lungs every 6 (six) hours as needed for wheezing or shortness of breath.    . ALPRAZolam (XANAX) 0.5 MG tablet Take 0.5 mg by mouth 4 (four) times daily as needed for anxiety or sleep.    Marland Kitchen amLODipine (NORVASC) 5 MG tablet Take 1 tablet (5 mg total) by mouth daily. 180 tablet 3  . aspirin 81 MG tablet Take 162 mg by mouth every morning.     . citalopram (CELEXA) 10 MG tablet Take 10 mg by mouth daily.    Marland Kitchen ezetimibe (ZETIA) 10 MG tablet Take 10 mg by mouth daily.    . metoprolol succinate (TOPROL-XL) 50 MG 24 hr tablet Take 50 mg by mouth daily. Take with or immediately following a meal.    . nitroGLYCERIN (NITROSTAT) 0.4 MG SL tablet Place 0.4 mg under the tongue every 5 (five) minutes as needed for chest pain.    . ramipril (ALTACE) 10 MG capsule TAKE 1 CAPSULE DAILY 90 capsule 2  . VIAGRA 100 MG tablet Take 1 tablet by mouth as needed.    Marland Kitchen HYDROcodone-acetaminophen (NORCO) 5-325 MG per tablet 1-2 tabs po q6 hours prn pain (Patient not taking: Reported on 10/21/2014) 30 tablet 0   No current facility-administered medications for this visit.    Allergies as of 10/21/2014 - Review Complete 10/21/2014  Allergen Reaction Noted  . Niacin and related Other (See Comments) 05/22/2012  . Penicillins    . Statins  03/28/2013    Family History  Problem Relation Age of Onset  . CAD Father     CABG   . CAD Brother     CABG in 42s  . CAD Brother   . Arthritis/Rheumatoid Mother   . Hyperlipidemia Mother   . Thrombocytopenia Mother   . COPD Son     smoker  . Alzheimer's disease Maternal Grandmother   . Lung disease Paternal Grandfather     Social History   Social History  . Marital Status: Married    Spouse Name: N/A  . Number of Children: N/A  . Years of Education: N/A   Occupational History  . Not on file.   Social History Main Topics  . Smoking status: Former Smoker  -- 2.00 packs/day for 28 years    Types: Cigarettes    Quit date: 03/28/1990  . Smokeless tobacco: Former Systems developer    Types: Chew  . Alcohol Use: No  . Drug Use: No  . Sexual Activity: Yes   Other Topics Concern  . Not on file   Social History Narrative   Lives in [place], [name]. Married, 2 children.     Review of Systems: General: Negative for anorexia, weight loss, fever, chills, fatigue, weakness.  Eyes: Negative for vision changes.  ENT: Negative for hoarseness, difficulty swallowing. CV: Negative for chest pain, angina, palpitations, peripheral edema.  Respiratory: Negative for dyspnea at rest, cough, sputum, wheezing.  GI: See history of present illness. Derm: Negative for rash or itching.  Endo: Negative for unusual weight change.  Heme: Negative for bruising or bleeding. Allergy: Negative for rash or hives.    Physical Exam: BP 138/87 mmHg  Pulse 64  Temp(Src) 97.6 F (36.4 C)  Ht 5\' 9"  (1.753 m)  Wt 226 lb (102.513 kg)  BMI 33.36 kg/m2 General:   Alert and oriented. Pleasant and cooperative. Well-nourished and well-developed.  Head:  Normocephalic and atraumatic. Eyes:  Without icterus, sclera clear and conjunctiva pink.  Ears:  Normal auditory acuity. Cardiovascular:  S1, S2 present without murmurs appreciated. Normal pulses noted. Extremities without clubbing or edema. Respiratory:  Clear to auscultation bilaterally. No wheezes, rales, or rhonchi. No distress.  Gastrointestinal:  +BS, rounded, soft, non-tender and non-distended. No HSM noted. No guarding or rebound. No masses appreciated.  Rectal:  Deferred  Skin:  Intact without significant lesions or rashes. Neurologic:  Alert and oriented x4;  grossly normal neurologically. Psych:  Alert and cooperative. Normal mood and affect. Heme/Lymph/Immune: No excessive bruising noted.    10/21/2014 9:02 AM

## 2014-11-07 ENCOUNTER — Other Ambulatory Visit: Payer: Self-pay

## 2014-11-07 DIAGNOSIS — K6389 Other specified diseases of intestine: Secondary | ICD-10-CM

## 2014-11-11 ENCOUNTER — Encounter: Payer: Self-pay | Admitting: Internal Medicine

## 2014-11-11 ENCOUNTER — Encounter (HOSPITAL_COMMUNITY): Payer: Self-pay | Admitting: Internal Medicine

## 2014-11-12 ENCOUNTER — Telehealth: Payer: Self-pay

## 2014-11-12 NOTE — Telephone Encounter (Signed)
Pt is set for CT on 11/13/2014

## 2014-11-12 NOTE — Telephone Encounter (Signed)
Per RMR-  Send letter to patient.  Send copy of letter with path to referring provider and PCP. Needs CTE of the abdomen and pelvis to further evaluate the small intestine for potential Crohn's disease.              Needs office visit with Walden Field in one month.

## 2014-11-12 NOTE — Telephone Encounter (Signed)
Letter mailed to the pt.  Please schedule CTE  Pt already had ov.

## 2014-11-13 ENCOUNTER — Ambulatory Visit (HOSPITAL_COMMUNITY)
Admission: RE | Admit: 2014-11-13 | Discharge: 2014-11-13 | Disposition: A | Discharge status: Home / Self Care | Payer: Medicare Other | Source: Ambulatory Visit | Attending: Internal Medicine | Admitting: Internal Medicine

## 2014-11-13 DIAGNOSIS — K633 Ulcer of intestine: Secondary | ICD-10-CM | POA: Diagnosis not present

## 2014-11-13 DIAGNOSIS — K6389 Other specified diseases of intestine: Secondary | ICD-10-CM | POA: Insufficient documentation

## 2014-11-13 LAB — POCT I-STAT CREATININE: Creatinine, Ser: 1 mg/dL (ref 0.61–1.24)

## 2014-11-13 MED ORDER — SODIUM CHLORIDE 0.9 % IJ SOLN
INTRAMUSCULAR | Status: AC
  Filled 2014-11-13: qty 60

## 2014-11-13 MED ORDER — IOHEXOL 300 MG/ML  SOLN: 100.0000 mL | Freq: Once | INTRAMUSCULAR | Status: DC | PRN

## 2014-11-13 MED ORDER — SODIUM CHLORIDE 0.9 % IJ SOLN
INTRAMUSCULAR | Status: AC
  Filled 2014-11-13: qty 750

## 2014-11-13 MED ORDER — IOHEXOL 300 MG/ML  SOLN
125.0000 mL | Freq: Once | INTRAMUSCULAR | Status: AC | PRN
  Administered 2014-11-13: 125 mL via INTRAVENOUS

## 2014-11-19 ENCOUNTER — Telehealth: Payer: Self-pay | Admitting: Internal Medicine

## 2014-11-19 NOTE — Telephone Encounter (Signed)
Pt's wife Izora Gala) called to see if patient's result were available on his CT he had done last week. I told her it's normally 7-10 business days before we have the results and the nurse would call.  [phone]

## 2014-11-20 ENCOUNTER — Telehealth: Payer: Self-pay | Admitting: Internal Medicine

## 2014-11-20 NOTE — Telephone Encounter (Signed)
Tried to call- NA-LMOM 

## 2014-11-20 NOTE — Telephone Encounter (Signed)
Patient was returning a call to JL. I told him she was still at lunch and would let her know he had called. [phone]

## 2014-11-20 NOTE — Telephone Encounter (Signed)
I spoke with the pt.  

## 2015-01-20 ENCOUNTER — Telehealth: Payer: Self-pay | Admitting: Internal Medicine

## 2015-01-20 ENCOUNTER — Encounter: Payer: Self-pay | Admitting: Nurse Practitioner

## 2015-01-20 ENCOUNTER — Other Ambulatory Visit: Payer: Self-pay

## 2015-01-20 ENCOUNTER — Ambulatory Visit (INDEPENDENT_AMBULATORY_CARE_PROVIDER_SITE_OTHER): Payer: Medicare Other | Admitting: Nurse Practitioner

## 2015-01-20 VITALS — BP 145/79 | HR 73 | Temp 97.6°F | Ht 69.0 in | Wt 226.8 lb

## 2015-01-20 DIAGNOSIS — K604 Rectal fistula: Secondary | ICD-10-CM | POA: Diagnosis not present

## 2015-01-20 DIAGNOSIS — K50111 Crohn's disease of large intestine with rectal bleeding: Secondary | ICD-10-CM | POA: Diagnosis not present

## 2015-01-20 DIAGNOSIS — K625 Hemorrhage of anus and rectum: Secondary | ICD-10-CM | POA: Diagnosis not present

## 2015-01-20 DIAGNOSIS — K5090 Crohn's disease, unspecified, without complications: Secondary | ICD-10-CM | POA: Insufficient documentation

## 2015-01-20 DIAGNOSIS — I2510 Atherosclerotic heart disease of native coronary artery without angina pectoris: Secondary | ICD-10-CM

## 2015-01-20 LAB — CBC WITH DIFFERENTIAL/PLATELET
Basophils Absolute: 0.1 10*3/uL (ref 0.0–0.1)
Basophils Relative: 1 % (ref 0–1)
Eosinophils Absolute: 0.3 10*3/uL (ref 0.0–0.7)
Eosinophils Relative: 4 % (ref 0–5)
HCT: 43.6 % (ref 39.0–52.0)
Hemoglobin: 15.1 g/dL (ref 13.0–17.0)
Lymphocytes Relative: 29 % (ref 12–46)
Lymphs Abs: 1.8 10*3/uL (ref 0.7–4.0)
MCH: 30.8 pg (ref 26.0–34.0)
MCHC: 34.6 g/dL (ref 30.0–36.0)
MCV: 88.8 fL (ref 78.0–100.0)
MPV: 10.2 fL (ref 8.6–12.4)
Monocytes Absolute: 0.6 10*3/uL (ref 0.1–1.0)
Monocytes Relative: 10 % (ref 3–12)
Neutro Abs: 3.5 10*3/uL (ref 1.7–7.7)
Neutrophils Relative %: 56 % (ref 43–77)
Platelets: 465 10*3/uL — ABNORMAL HIGH (ref 150–400)
RBC: 4.91 MIL/uL (ref 4.22–5.81)
RDW: 13.4 % (ref 11.5–15.5)
WBC: 6.3 10*3/uL (ref 4.0–10.5)

## 2015-01-20 LAB — COMPREHENSIVE METABOLIC PANEL
ALT: 14 U/L (ref 9–46)
AST: 18 U/L (ref 10–35)
Albumin: 3.8 g/dL (ref 3.6–5.1)
Alkaline Phosphatase: 104 U/L (ref 40–115)
BUN: 9 mg/dL (ref 7–25)
CO2: 27 mmol/L (ref 20–31)
Calcium: 9 mg/dL (ref 8.6–10.3)
Chloride: 101 mmol/L (ref 98–110)
Creat: 0.87 mg/dL (ref 0.70–1.25)
Glucose, Bld: 159 mg/dL — ABNORMAL HIGH (ref 65–99)
Potassium: 4.3 mmol/L (ref 3.5–5.3)
Sodium: 138 mmol/L (ref 135–146)
Total Bilirubin: 0.4 mg/dL (ref 0.2–1.2)
Total Protein: 6.9 g/dL (ref 6.1–8.1)

## 2015-01-20 MED ORDER — BUDESONIDE 3 MG PO CPEP: 9.0000 mg | ORAL_CAPSULE | Freq: Every day | ORAL | Status: DC

## 2015-01-20 NOTE — Progress Notes (Signed)
Referring Provider: Redmond School, MD Primary Care Physician:  [name]., MD Primary GI:  [name]  Chief Complaint  Patient presents with  . Rectal Bleeding    HPI:   64 year old male presents for follow-up on rectal bleeding and pain. Colonoscopy completed on 11/06/2014 found skip colonic ulcerations with significant ileocecal valve involvement and rectal sparing most consistent with Crohn's disease. Committed follow-up with CT enterography to further evaluate. Biopsies were taken on the colonoscopy and return from pathology showing the cecal valve biopsy and colon biopsy both with ulcerated colonic mucosa which was nonspecific and could be result of NSAID related inflammation or Crohn's disease. ET enterography was nonspecific and findings an without significant enteritis. Determined if he was on chronic NSAIDs he would need to stop these as instructed. If felt to be more Crohn's disease would start Entocort 9 mg daily for one month.  Today he states he has not taken ASA or any other NSAIDs since the colonoscopy (approximately 2.5 months ago). Still having rectal pain and intermittent bleeding. The bleeding is intermittent. The fistula repaired by surgery in 2015 is still draining per the patient. It is draining clear fluid when expressed. Pain was severe enough yesterday that he took a pain pill. Pain is "on the whole side on the inside." Has a bowel movement 2-3 times a day, stools are variable in [place] scale. Denies N/V, other abdominal pain, unintentional weight loss, fever, chills. Denies chest pain, dyspnea, dizziness, lightheadedness, syncope, near syncope. Denies any other upper or lower GI symptoms.  Past Medical History  Diagnosis Date  . Depression   . Myocardial infarct [facility])     a. MI in 1994 s/p PTCA alone  . Hypertension   . Nephrolithiasis   . Complication of anesthesia     heart rate dropped twice during surg  . Arthritis   . CAD (coronary artery  disease)     a. Mid RCA CTO w/ L-R collateralization, nonobstructive dz elsewhere by 2009 cath  . Obesity   . History of tobacco abuse   . Hyperlipidemia     a. statin, niacin intolerant  . Sleep apnea     does not tol BIPAP or CPAP  . Anxiety     Past Surgical History  Procedure Laterality Date  . Knee surgery    . Back surgery    . Wrist surgery      carpal tunnel  . Cystectomy    . Nose surgery    . Pilonidal cyst excision    . Incision and drainage abscess Right 05/23/2012    Procedure: INCISION AND DRAINAGE ABSCESS;  Surgeon: [name], MD;  Location: [facility];  Service: General;  Laterality: Right;  . Splenectomy    . Cardiac catheterization  1994, 2009    a. PTCA alone in 1994 b. RCA CTO w/ L-R collaterals, 40-50% prox RCA, 20% prox LAD; EF 50-55%, inferoapical HK  . Lower extremity arterial doppler  08/02/2007    Bilateral ABIs-no evidence of arterial insufficiency. Bilateral PVRs demonstrate normal waveforms.  . Cardiac catheterization  04/02/2001    Continue medical therapy.  . Cardiac catheterization  11/07/2000    Continue medical therapy.  . Cardiac catheterization  09/24/1996    Continue medical therapy  . Cardiovascular stress test  04/06/2010    No scintigraphic evidence of inducible myocardial ischemia. No ECG changes. EKG negative for ischemia.  . Transthoracic echocardiogram  03/05/2009    EF >55%, normal LV wall thickness.  Marland Kitchen  Anal fistulotomy N/A 06/28/2013    Procedure: ANAL FISTULOTOMY;  Surgeon: [name], MD;  Location: [facility];  Service: General;  Laterality: N/A;  . Carpal tunnel release Left 10/09/2014    Procedure: LEFT CARPAL TUNNEL RELEASE;  Surgeon: [name], MD;  Location: [facility];  Service: Orthopedics;  Laterality: Left;  . Colonoscopy N/A 11/06/2014    RMR: Skipped Colonic ulcerations with significant ileocecal valve involvement and rectal sparing most consisitant with Crohns disease. Minimal non-steroidal drug use.  Probable colonic lipoma    Current Outpatient Prescriptions  Medication Sig Dispense Refill  . albuterol (PROVENTIL HFA;VENTOLIN HFA) 108 (90 BASE) MCG/ACT inhaler Inhale 1 puff into the lungs every 6 (six) hours as needed for wheezing or shortness of breath.    . ALPRAZolam (XANAX) 0.5 MG tablet Take 0.5 mg by mouth 4 (four) times daily as needed for anxiety or sleep.    Marland Kitchen amLODipine (NORVASC) 5 MG tablet Take 1 tablet (5 mg total) by mouth daily. 180 tablet 3  . citalopram (CELEXA) 10 MG tablet Take 10 mg by mouth daily.    Marland Kitchen ezetimibe (ZETIA) 10 MG tablet Take 10 mg by mouth daily.    . metoprolol succinate (TOPROL-XL) 50 MG 24 hr tablet Take 50 mg by mouth daily. Take with or immediately following a meal.    . nitroGLYCERIN (NITROSTAT) 0.4 MG SL tablet Place 0.4 mg under the tongue every 5 (five) minutes as needed for chest pain.    . ramipril (ALTACE) 10 MG capsule TAKE 1 CAPSULE DAILY 90 capsule 2  . VIAGRA 100 MG tablet Take 1 tablet by mouth as needed.    . budesonide (ENTOCORT EC) 3 MG 24 hr capsule Take 3 capsules (9 mg total) by mouth daily. 90 capsule 0   No current facility-administered medications for this visit.    Allergies as of 01/20/2015 - Review Complete 01/20/2015  Allergen Reaction Noted  . Niacin and related Other (See Comments) 05/22/2012  . Penicillins    . Statins  03/28/2013    Family History  Problem Relation Age of Onset  . CAD Father     CABG   . CAD Brother     CABG in 48s  . CAD Brother   . Arthritis/Rheumatoid Mother   . Hyperlipidemia Mother   . Thrombocytopenia Mother   . COPD Son     smoker  . Alzheimer's disease Maternal Grandmother   . Lung disease Paternal Grandfather     Social History   Social History  . Marital Status: Married    Spouse Name: N/A  . Number of Children: N/A  . Years of Education: N/A   Social History Main Topics  . Smoking status: Former Smoker -- 2.00 packs/day for 28 years    Types: Cigarettes    Quit  date: 03/28/1990  . Smokeless tobacco: Former Systems developer    Types: Chew  . Alcohol Use: No  . Drug Use: No  . Sexual Activity: Yes   Other Topics Concern  . None   Social History Narrative   Lives in [place], [name]. Married, 2 children.     Review of Systems: General: Negative for anorexia, weight loss, fever, chills, fatigue, weakness. Eyes: Negative for vision changes.  ENT: Negative for hoarseness, difficulty swallowing. CV: Negative for chest pain, angina, palpitations,  peripheral edema.  Respiratory: Negative for dyspnea at rest, cough, sputum, wheezing.  GI: See history of present illness. Derm: Negative for rash or itching.  Endo: Negative  for unusual weight change.  Heme: Negative for bruising or bleeding. Allergy: Negative for rash or hives.   Physical Exam: BP 145/79 mmHg  Pulse 73  Temp(Src) 97.6 F (36.4 C) (Oral)  Ht 5\' 9"  (1.753 m)  Wt 226 lb 12.8 oz (102.876 kg)  BMI 33.48 kg/m2 General:   Alert and oriented. Pleasant and cooperative. Well-nourished and well-developed.  Head:  Normocephalic and atraumatic. Eyes:  Without icterus, sclera clear and conjunctiva pink.  Ears:  Normal auditory acuity. Cardiovascular:  S1, S2 present without murmurs appreciated. Extremities without clubbing or edema. Respiratory:  Clear to auscultation bilaterally. No wheezes, rales, or rhonchi. No distress.  Gastrointestinal:  +BS, rounded but soft, non-tender and non-distended. No HSM noted. No guarding or rebound. No masses appreciated.  Rectal:  Deferred  Skin:  Intact without significant lesions or rashes. Neurologic:  Alert and oriented x4;  grossly normal neurologically. Psych:  Alert and cooperative. Normal mood and affect. Heme/Lymph/Immune: No excessive bruising noted.    01/20/2015 9:54 AM

## 2015-01-20 NOTE — Progress Notes (Signed)
CC'ED TO PCP 

## 2015-01-20 NOTE — Assessment & Plan Note (Signed)
Patient with a chronic rectal fistula status post surgical repair approximately 2 years ago. States he still having problems were persistently drains and when he follows up with the surgeon he is told "it'll stop eventually." He is not satisfied with this as it has been 2 years and continues to have problems. He is requesting referral to a second opinion surgeon for other options. We will provide him with a referral, continue to monitor. Follow-up in one month

## 2015-01-20 NOTE — Assessment & Plan Note (Signed)
Rectal bleeding is improved however does recur intermittently and last episode of rectal bleeding was yesterday. Colonoscopy indicative of ulcerations deemed to be Crohn's versus NSAID use. He stopped using all NSAIDs since then. Return for follow-up in 1 month.

## 2015-01-20 NOTE — Patient Instructions (Signed)
1. I send a medication to the pharmacy. Take 9 mg once a day for one month. 2. Call us if any worsening or severe symptoms. 3. We will send in a referral for you to see a surgeon for second opinion for your fistula. 4. Return for follow-up in 1 month.

## 2015-01-20 NOTE — Telephone Encounter (Signed)
rx was sent to [facility]

## 2015-01-20 NOTE — Telephone Encounter (Signed)
PATIENT THINKS THAT WRONG PHARMACY INFO WAS GIVEN.  HE NOW USES [facility] IN [place]   PLEASE FAX SCRIPT THERE

## 2015-01-20 NOTE — Assessment & Plan Note (Signed)
Patient with ulcerations on colonoscopy consistent with Crohn's disease versus NSAID use. Patient is stopped taking all NSAIDs including his daily aspirin as of the day of colonoscopy. Continues to have pain and occasional rectal bleeding. Per previously prescribed plan we will start him on Entocort 9 mg once daily for 4 weeks. We'll check a CBC and CMP prior to starting medication. Return for follow-up in 1 month for further evaluation.

## 2015-01-23 ENCOUNTER — Telehealth: Payer: Self-pay | Admitting: Internal Medicine

## 2015-01-23 NOTE — Telephone Encounter (Signed)
Routing to EG. Medication was entocort.

## 2015-01-23 NOTE — Telephone Encounter (Signed)
PATIENT STATED THAT THE MEDICATION CALLED IN FOR HIM WAS 119$ COPAY.  IS THERE SOMETHING ELSE THAT CAN BE CALLED IN THAT MEDICARE WILL COVER?  PLEASE ADVISE  [phone]

## 2015-01-26 NOTE — Telephone Encounter (Signed)
Sorry, we cannot get samples of entocort and they do not have a patient assistance program.

## 2015-01-26 NOTE — Telephone Encounter (Signed)
Please tell the patient we haven't forgotten. We're trying to get ahold of the drug company and see if they can send [place] samples. We'll let him know when we hear something back.

## 2015-02-08 HISTORY — PX: ANAL FISTULOTOMY: SHX6423

## 2015-02-24 ENCOUNTER — Ambulatory Visit: Payer: Medicare Other | Admitting: Internal Medicine

## 2015-03-02 DIAGNOSIS — K50111 Crohn's disease of large intestine with rectal bleeding: Secondary | ICD-10-CM | POA: Diagnosis not present

## 2015-03-02 DIAGNOSIS — K603 Anal fistula: Secondary | ICD-10-CM | POA: Diagnosis not present

## 2015-03-02 DIAGNOSIS — K602 Anal fissure, unspecified: Secondary | ICD-10-CM | POA: Diagnosis not present

## 2015-03-17 ENCOUNTER — Encounter: Payer: Self-pay | Admitting: Internal Medicine

## 2015-03-17 ENCOUNTER — Telehealth: Payer: Self-pay

## 2015-03-17 ENCOUNTER — Ambulatory Visit (INDEPENDENT_AMBULATORY_CARE_PROVIDER_SITE_OTHER): Payer: Medicare Other | Admitting: Internal Medicine

## 2015-03-17 VITALS — BP 136/81 | HR 68 | Temp 98.2°F | Ht 69.0 in | Wt 226.0 lb

## 2015-03-17 DIAGNOSIS — K6389 Other specified diseases of intestine: Secondary | ICD-10-CM | POA: Diagnosis not present

## 2015-03-17 DIAGNOSIS — K529 Noninfective gastroenteritis and colitis, unspecified: Secondary | ICD-10-CM

## 2015-03-17 MED ORDER — BUDESONIDE 3 MG PO CPEP: 9.0000 mg | ORAL_CAPSULE | Freq: Every day | ORAL | Status: DC

## 2015-03-17 NOTE — Telephone Encounter (Signed)
Pt is calling because the Entocort is going to cost him $700.00. He is willing to try some medication but he can not do that much for medication. Please advise  If there is anything different he can try?

## 2015-03-17 NOTE — Telephone Encounter (Signed)
Checked pts formulary- pts insurance has apriso and sulfasalazine as tier 3

## 2015-03-17 NOTE — Telephone Encounter (Signed)
[name] to find out about the best benefit with a mesalamine preparation

## 2015-03-17 NOTE — Progress Notes (Signed)
Primary Care Physician:  [name]., MD Primary Gastroenterologist:  [name]  Pre-Procedure History & Physical: HPI:  [name] is a 65 y.o. male here for probable ileocolonic Crohn's. The biopsies and colonoscopic findings most consistent with Crohn's colitis. No significant NSAIDs prior to colonoscopy (did take aspirin in the form of butalbital and a baby aspirin daily for cardiac prophylaxis). Patient has stopped all nonsteroidal agents for now without much difference in symptoms. Tendency towards diarrhea; no blood per rectum. Occasionally constipated.  Big issue with patient is chronic anal fissure and fistula related to pilonidal cyst. Saw Dr. Johney Maine in [place]. Felt no surgical intervention was warranted until inflammatory bowel disease under better control. We recommended beginning budesonide and a mesalamine preparation. However, patient felt it was too expensive. We talked about the alternative of systemic corticosteroid therapy with prednisone. Biologic therapy at some length. Patient willing to try Entocort for a finite period of time. Patient states cost is exorbitant.  Past Medical History  Diagnosis Date  . Depression   . Myocardial infarct [facility])     a. MI in 1994 s/p PTCA alone  . Hypertension   . Nephrolithiasis   . Complication of anesthesia     heart rate dropped twice during surg  . Arthritis   . CAD (coronary artery disease)     a. Mid RCA CTO w/ L-R collateralization, nonobstructive dz elsewhere by 2009 cath  . Obesity   . History of tobacco abuse   . Hyperlipidemia     a. statin, niacin intolerant  . Sleep apnea     does not tol BIPAP or CPAP  . Anxiety     Past Surgical History  Procedure Laterality Date  . Knee surgery    . Back surgery    . Wrist surgery      carpal tunnel  . Cystectomy    . Nose surgery    . Pilonidal cyst excision    . Incision and drainage abscess Right 05/23/2012    Procedure: INCISION AND DRAINAGE ABSCESS;   Surgeon: [name], MD;  Location: [facility];  Service: General;  Laterality: Right;  . Splenectomy    . Cardiac catheterization  1994, 2009    a. PTCA alone in 1994 b. RCA CTO w/ L-R collaterals, 40-50% prox RCA, 20% prox LAD; EF 50-55%, inferoapical HK  . Lower extremity arterial doppler  08/02/2007    Bilateral ABIs-no evidence of arterial insufficiency. Bilateral PVRs demonstrate normal waveforms.  . Cardiac catheterization  04/02/2001    Continue medical therapy.  . Cardiac catheterization  11/07/2000    Continue medical therapy.  . Cardiac catheterization  09/24/1996    Continue medical therapy  . Cardiovascular stress test  04/06/2010    No scintigraphic evidence of inducible myocardial ischemia. No ECG changes. EKG negative for ischemia.  . Transthoracic echocardiogram  03/05/2009    EF >55%, normal LV wall thickness.  . Anal fistulotomy N/A 06/28/2013    Procedure: ANAL FISTULOTOMY;  Surgeon: [name], MD;  Location: [facility];  Service: General;  Laterality: N/A;  . Carpal tunnel release Left 10/09/2014    Procedure: LEFT CARPAL TUNNEL RELEASE;  Surgeon: [name], MD;  Location: [facility];  Service: Orthopedics;  Laterality: Left;  . Colonoscopy N/A 11/06/2014    RMR: Skipped Colonic ulcerations with significant ileocecal valve involvement and rectal sparing most consisitant with Crohns disease. Minimal non-steroidal drug use. Probable colonic lipoma    Prior to Admission  medications   Medication Sig Start Date End Date Taking? Authorizing Provider  albuterol (PROVENTIL HFA;VENTOLIN HFA) 108 (90 BASE) MCG/ACT inhaler Inhale 1 puff into the lungs every 6 (six) hours as needed for wheezing or shortness of breath.   Yes Historical Provider, MD  ALPRAZolam Duanne Moron) 0.5 MG tablet Take 0.5 mg by mouth 4 (four) times daily as needed for anxiety or sleep.   Yes Historical Provider, MD  amLODipine (NORVASC) 5 MG tablet Take 1 tablet (5 mg total) by mouth daily. 03/28/13  Yes  Troy Sine, MD  citalopram (CELEXA) 10 MG tablet Take 10 mg by mouth daily.   Yes Historical Provider, MD  ezetimibe (ZETIA) 10 MG tablet Take 10 mg by mouth daily.   Yes Historical Provider, MD  metoprolol succinate (TOPROL-XL) 50 MG 24 hr tablet Take 50 mg by mouth daily. Take with or immediately following a meal.   Yes Historical Provider, MD  nitroGLYCERIN (NITROSTAT) 0.4 MG SL tablet Place 0.4 mg under the tongue every 5 (five) minutes as needed for chest pain.   Yes Historical Provider, MD  ramipril (ALTACE) 10 MG capsule TAKE 1 CAPSULE DAILY 09/11/13  Yes Lorretta Harp, MD  VIAGRA 100 MG tablet Take 1 tablet by mouth as needed. 03/10/14  Yes Historical Provider, MD  budesonide (ENTOCORT EC) 3 MG 24 hr capsule Take 3 capsules (9 mg total) by mouth daily. Patient not taking: Reported on 03/17/2015 01/20/15   Carlis Stable, NP    Allergies as of 03/17/2015 - Review Complete 01/20/2015  Allergen Reaction Noted  . Niacin and related Other (See Comments) 05/22/2012  . Penicillins    . Statins  03/28/2013    Family History  Problem Relation Age of Onset  . CAD Father     CABG   . CAD Brother     CABG in 63s  . CAD Brother   . Arthritis/Rheumatoid Mother   . Hyperlipidemia Mother   . Thrombocytopenia Mother   . COPD Son     smoker  . Alzheimer's disease Maternal Grandmother   . Lung disease Paternal Grandfather     Social History   Social History  . Marital Status: Married    Spouse Name: N/A  . Number of Children: N/A  . Years of Education: N/A   Occupational History  . Not on file.   Social History Main Topics  . Smoking status: Former Smoker -- 2.00 packs/day for 28 years    Types: Cigarettes    Quit date: 03/28/1990  . Smokeless tobacco: Former Systems developer    Types: Chew  . Alcohol Use: No  . Drug Use: No  . Sexual Activity: Yes   Other Topics Concern  . Not on file   Social History Narrative   Lives in [place], [name]. Married, 2 children.     Review of  Systems: See HPI, otherwise negative ROS  Physical Exam: BP 136/81 mmHg  Pulse 68  Temp(Src) 98.2 F (36.8 C) (Oral)  Ht 5\' 9"  (1.753 m)  Wt 226 lb (102.513 kg)  BMI 33.36 kg/m2 General:   Alert,  Well-developed, well-nourished, pleasant and cooperative in NAD Neck:  Supple; no masses or thyromegaly. No significant cervical adenopathy. Lungs:  Clear throughout to auscultation.   No wheezes, crackles, or rhonchi. No acute distress. Heart:  Regular rate and rhythm; no murmurs, clicks, rubs,  or gallops. Abdomen: Non-distended, normal bowel sounds.  Soft and nontender without appreciable mass or hepatosplenomegaly.  Pulses:  Normal pulses noted.  Extremities:  Without clubbing or edema.  Impression:    65 year old gentleman with inflammatory changes of the terminal ileum and colon most consistent with Crohn's disease(no significant NSAID use predating colonoscopy and none since colonoscopy). He has a pilonidal cyst with fistula tract and a chronic anal fissure per Dr. Johney Maine' assessment in [place]. No surgical treatment felt to be indicated at this time until Crohn's disease brought into remission. Patient has not pursued therapy with Entocort, citing cost. Spectrum of treatment for inflammatory bowel disease i.e. Crohn's disease including systemic corticosteroid therapy, Entocort, mesalamine (off label), azathioprine and biologic therapy reviewed at some length..  From the standpoint of his luminal Crohn's disease, he has no major symptoms at this time.  Recommendations:  Begin Entocort 9 mg daily x 6 weeks  Will begin Mesalamine (as soon as insurance benefit is determined - will be off-label treatment as discussed)  Let me know how you are doing in 3 weeks  Office visit in 6 weeks    Notice: This dictation was prepared with Dragon dictation along with smaller phrase technology. Any transcriptional errors that result from this process are unintentional and may not be corrected upon  review.

## 2015-03-17 NOTE — Patient Instructions (Addendum)
Begin Entocort 9 mg daily x 6 weeks  Will begin Mesalamine (as soon as insurance benefit is determined - will be off-label treatment as discussed)  Let me know how you are doing in 3 weeks  Office visit in 6 weeks

## 2015-03-20 MED ORDER — MESALAMINE ER 0.375 G PO CP24: 1.5000 g | ORAL_CAPSULE | Freq: Every day | ORAL | Status: DC

## 2015-03-20 NOTE — Telephone Encounter (Signed)
pts wife is aware. He did get some of the entocort and has been taking it, he just didn't get the entire 8 weeks because they couldn't afford it. New rx has been sent in to the pharmacy.

## 2015-03-20 NOTE — Addendum Note (Signed)
Addended by: Claudina Lick on: 03/20/2015 12:33 PM   Modules accepted: Orders

## 2015-03-20 NOTE — Telephone Encounter (Signed)
Faythe Ghee; will go with Apriso. Still will be off label. Rx for Apriso 0.375 g tablets. Dispense 120  Patient to take 1.5 g each morning; one year refill

## 2015-04-07 ENCOUNTER — Telehealth: Payer: Self-pay | Admitting: *Deleted

## 2015-04-07 NOTE — Telephone Encounter (Signed)
Spoke with the pt and his wife- medication is entocort, he has not started the apriso yet, he wanted to finish the entocort before he started apriso. He said it feels like his back is breaking. It hurts across his shoulders and to his back. It started about 1.5 weeks after starting entocort. He continued it because he paid $700 for it and didn't want to waste it. The medication has helped his GI symptoms some, he is not in constant pain in his abd, it just comes and goes now. bm's are ok, he takes miralax prn. No blood in his stool, no fever, no n/v, no breathing difficulties.  Pt wants to know what he should do?

## 2015-04-07 NOTE — Telephone Encounter (Signed)
Tried to call pt- [id number]. Need to know name of the medication and and more information about what problems he is having.

## 2015-04-07 NOTE — Telephone Encounter (Signed)
Anything is possible regarding side effects.  He should've already started mesalamine. The plan was to overlap and get mesalamine in his system so we can start to taper back the Entocort  -not getting Entocort and then change over to mesalamine without overlap.  Start mesalamine now. See PCP about back pain. If no other cause for back pain found may need to stop the Entocort.

## 2015-04-07 NOTE — Telephone Encounter (Signed)
pts wife is aware. 

## 2015-04-07 NOTE — Telephone Encounter (Signed)
BEEN ON MEDS FOR 3 WEEKS AND HE IS HAVING BAD BACK PAIN BETWEEN HIS SHOULDERS AND ABOVE HIS KIDNEYS.  SIDE EFFECTS OF MEDICINE IS BACK PAIN, DOES RMR THINK THIS IS THE CAUSE.  HIS BOTTOM IS STILL HURTING SOME   PLEASE ADVISE [phone]

## 2015-04-16 ENCOUNTER — Ambulatory Visit (INDEPENDENT_AMBULATORY_CARE_PROVIDER_SITE_OTHER): Payer: Medicare Other | Admitting: Otolaryngology

## 2015-04-16 DIAGNOSIS — H903 Sensorineural hearing loss, bilateral: Secondary | ICD-10-CM | POA: Diagnosis not present

## 2015-04-16 DIAGNOSIS — H6123 Impacted cerumen, bilateral: Secondary | ICD-10-CM | POA: Diagnosis not present

## 2015-04-20 ENCOUNTER — Ambulatory Visit (HOSPITAL_COMMUNITY)
Admission: RE | Admit: 2015-04-20 | Discharge: 2015-04-20 | Disposition: A | Discharge status: Home / Self Care | Payer: Medicare Other | Source: Ambulatory Visit | Attending: Internal Medicine | Admitting: Internal Medicine

## 2015-04-20 ENCOUNTER — Other Ambulatory Visit (HOSPITAL_COMMUNITY): Payer: Self-pay | Admitting: Internal Medicine

## 2015-04-20 DIAGNOSIS — R52 Pain, unspecified: Secondary | ICD-10-CM | POA: Diagnosis not present

## 2015-04-20 DIAGNOSIS — Z6834 Body mass index (BMI) 34.0-34.9, adult: Secondary | ICD-10-CM | POA: Diagnosis not present

## 2015-04-20 DIAGNOSIS — E119 Type 2 diabetes mellitus without complications: Secondary | ICD-10-CM | POA: Diagnosis not present

## 2015-04-20 DIAGNOSIS — M546 Pain in thoracic spine: Secondary | ICD-10-CM | POA: Diagnosis not present

## 2015-04-20 DIAGNOSIS — K601 Chronic anal fissure: Secondary | ICD-10-CM | POA: Diagnosis not present

## 2015-04-20 DIAGNOSIS — E782 Mixed hyperlipidemia: Secondary | ICD-10-CM | POA: Diagnosis not present

## 2015-04-20 DIAGNOSIS — E6609 Other obesity due to excess calories: Secondary | ICD-10-CM | POA: Diagnosis not present

## 2015-04-20 DIAGNOSIS — G894 Chronic pain syndrome: Secondary | ICD-10-CM | POA: Diagnosis not present

## 2015-04-20 DIAGNOSIS — Z1389 Encounter for screening for other disorder: Secondary | ICD-10-CM | POA: Diagnosis not present

## 2015-04-20 DIAGNOSIS — M47814 Spondylosis without myelopathy or radiculopathy, thoracic region: Secondary | ICD-10-CM | POA: Insufficient documentation

## 2015-04-20 DIAGNOSIS — K6289 Other specified diseases of anus and rectum: Secondary | ICD-10-CM | POA: Diagnosis not present

## 2015-04-20 DIAGNOSIS — Z23 Encounter for immunization: Secondary | ICD-10-CM | POA: Diagnosis not present

## 2015-04-20 DIAGNOSIS — F419 Anxiety disorder, unspecified: Secondary | ICD-10-CM | POA: Diagnosis not present

## 2015-04-20 DIAGNOSIS — M1991 Primary osteoarthritis, unspecified site: Secondary | ICD-10-CM | POA: Diagnosis not present

## 2015-04-28 ENCOUNTER — Ambulatory Visit (INDEPENDENT_AMBULATORY_CARE_PROVIDER_SITE_OTHER): Payer: Medicare Other | Admitting: Internal Medicine

## 2015-04-28 ENCOUNTER — Other Ambulatory Visit: Payer: Self-pay

## 2015-04-28 ENCOUNTER — Encounter: Payer: Self-pay | Admitting: Internal Medicine

## 2015-04-28 VITALS — BP 126/74 | HR 91 | Temp 97.4°F | Ht 67.0 in | Wt 226.0 lb

## 2015-04-28 DIAGNOSIS — K5010 Crohn's disease of large intestine without complications: Secondary | ICD-10-CM | POA: Diagnosis not present

## 2015-04-28 DIAGNOSIS — K50919 Crohn's disease, unspecified, with unspecified complications: Secondary | ICD-10-CM

## 2015-04-28 DIAGNOSIS — K602 Anal fissure, unspecified: Secondary | ICD-10-CM

## 2015-04-28 NOTE — Progress Notes (Signed)
Primary Care Physician:  [name]., MD Primary Gastroenterologist:  [name]  Pre-Procedure History & Physical: HPI:  [name] is a 65 y.o. male here for followup of anal fissure/ fistula and ileocolonic Crohn's disease. He's been on a presented for about 6 weeks. No apparent issues there. Exacerbation of low back pain. Has been told he has severe arthritis. Started on Flexeril one week ago. Developed a rash on his chest and upper arms 2 days ago. Complains quite a bit of moisture in his intertriginous areas. Has significant proctalgia when he attempts a bowel movement has 1-3 bowel movements daily. Really has any constipation diarrhea abdominal pain. No taking erectile dysfunction medication. Could not afford budesonide previously never restart the medication.  Past Medical History  Diagnosis Date  . Depression   . Myocardial infarct [facility])     a. MI in 1994 s/p PTCA alone  . Hypertension   . Nephrolithiasis   . Complication of anesthesia     heart rate dropped twice during surg  . Arthritis   . CAD (coronary artery disease)     a. Mid RCA CTO w/ L-R collateralization, nonobstructive dz elsewhere by 2009 cath  . Obesity   . History of tobacco abuse   . Hyperlipidemia     a. statin, niacin intolerant  . Sleep apnea     does not tol BIPAP or CPAP  . Anxiety     Past Surgical History  Procedure Laterality Date  . Knee surgery    . Back surgery    . Wrist surgery      carpal tunnel  . Cystectomy    . Nose surgery    . Pilonidal cyst excision    . Incision and drainage abscess Right 05/23/2012    Procedure: INCISION AND DRAINAGE ABSCESS;  Surgeon: [name], MD;  Location: [facility];  Service: General;  Laterality: Right;  . Splenectomy    . Cardiac catheterization  1994, 2009    a. PTCA alone in 1994 b. RCA CTO w/ L-R collaterals, 40-50% prox RCA, 20% prox LAD; EF 50-55%, inferoapical HK  . Lower extremity arterial doppler  08/02/2007    Bilateral ABIs-no  evidence of arterial insufficiency. Bilateral PVRs demonstrate normal waveforms.  . Cardiac catheterization  04/02/2001    Continue medical therapy.  . Cardiac catheterization  11/07/2000    Continue medical therapy.  . Cardiac catheterization  09/24/1996    Continue medical therapy  . Cardiovascular stress test  04/06/2010    No scintigraphic evidence of inducible myocardial ischemia. No ECG changes. EKG negative for ischemia.  . Transthoracic echocardiogram  03/05/2009    EF >55%, normal LV wall thickness.  . Anal fistulotomy N/A 06/28/2013    Procedure: ANAL FISTULOTOMY;  Surgeon: [name], MD;  Location: [facility];  Service: General;  Laterality: N/A;  . Carpal tunnel release Left 10/09/2014    Procedure: LEFT CARPAL TUNNEL RELEASE;  Surgeon: [name], MD;  Location: [place];  Service: Orthopedics;  Laterality: Left;  . Colonoscopy N/A 11/06/2014    RMR: Skipped Colonic ulcerations with significant ileocecal valve involvement and rectal sparing most consisitant with Crohns disease. Minimal non-steroidal drug use. Probable colonic lipoma    Prior to Admission medications   Medication Sig Start Date End Date Taking? Authorizing Provider  albuterol (PROVENTIL HFA;VENTOLIN HFA) 108 (90 BASE) MCG/ACT inhaler Inhale 1 puff into the lungs every 6 (six) hours as needed for wheezing or shortness of breath.  Yes Historical Provider, MD  ALPRAZolam Duanne Moron) 0.5 MG tablet Take 0.5 mg by mouth 4 (four) times daily as needed for anxiety or sleep.   Yes Historical Provider, MD  amLODipine (NORVASC) 5 MG tablet Take 1 tablet (5 mg total) by mouth daily. 03/28/13  Yes Troy Sine, MD  citalopram (CELEXA) 10 MG tablet Take 10 mg by mouth daily.   Yes Historical Provider, MD  ezetimibe (ZETIA) 10 MG tablet Take 10 mg by mouth daily.   Yes Historical Provider, MD  mesalamine (APRISO) 0.375 g 24 hr capsule Take 4 capsules (1.5 g total) by mouth daily. 03/20/15  Yes Daneil Dolin, MD    metoprolol succinate (TOPROL-XL) 50 MG 24 hr tablet Take 50 mg by mouth daily. Take with or immediately following a meal.   Yes Historical Provider, MD  nitroGLYCERIN (NITROSTAT) 0.4 MG SL tablet Place 0.4 mg under the tongue every 5 (five) minutes as needed for chest pain.   Yes Historical Provider, MD  ramipril (ALTACE) 10 MG capsule TAKE 1 CAPSULE DAILY 09/11/13  Yes Lorretta Harp, MD  VIAGRA 100 MG tablet Take 1 tablet by mouth as needed. 03/10/14  Yes Historical Provider, MD  budesonide (ENTOCORT EC) 3 MG 24 hr capsule Take 3 capsules (9 mg total) by mouth daily. Patient not taking: Reported on 03/17/2015 01/20/15   Carlis Stable, NP  budesonide (ENTOCORT EC) 3 MG 24 hr capsule Take 3 capsules (9 mg total) by mouth daily. For 6 weeks Patient not taking: Reported on 04/28/2015 03/17/15   Daneil Dolin, MD    Allergies as of 04/28/2015 - Review Complete 04/28/2015  Allergen Reaction Noted  . Niacin and related Other (See Comments) 05/22/2012  . Penicillins    . Statins  03/28/2013    Family History  Problem Relation Age of Onset  . CAD Father     CABG   . CAD Brother     CABG in 40s  . CAD Brother   . Arthritis/Rheumatoid Mother   . Hyperlipidemia Mother   . Thrombocytopenia Mother   . COPD Son     smoker  . Alzheimer's disease Maternal Grandmother   . Lung disease Paternal Grandfather     Social History   Social History  . Marital Status: Married    Spouse Name: N/A  . Number of Children: N/A  . Years of Education: N/A   Occupational History  . Not on file.   Social History Main Topics  . Smoking status: Former Smoker -- 2.00 packs/day for 28 years    Types: Cigarettes    Quit date: 03/28/1990  . Smokeless tobacco: Former Systems developer    Types: Chew  . Alcohol Use: No  . Drug Use: No  . Sexual Activity: Yes   Other Topics Concern  . Not on file   Social History Narrative   Lives in [place], [name]. Married, 2 children.     Review of Systems: See HPI, otherwise  negative ROS  Physical Exam: BP 126/74 mmHg  Pulse 91  Temp(Src) 97.4 F (36.3 C) (Oral)  Ht 5\' 7"  (1.702 m)  Wt 226 lb (102.513 kg)  BMI 35.39 kg/m2 General:   Alert,  Well-developed, well-nourished, pleasant and cooperative in NAD Skin:  Slightly erythematous rash on chest and both upper arms. Certainly, no blisters. Eyes:  Sclera clear, no icterus.   Conjunctiva pink. Lungs:  Clear throughout to auscultation.   No wheezes, crackles, or rhonchi. No acute distress. Heart:  Regular  rate and rhythm; no murmurs, clicks, rubs,  or gallops. Abdomen: Non-distended, normal bowel sounds.  Soft and nontender without appreciable mass or hepatosplenomegaly.  Pulses:  Normal pulses noted. Extremities:  Without clubbing or edema.  Impression:  Ileocolonic Crohn's disease with minimal bowel symptoms. Has proctalgia with a bowel movement. He is not really having diarrhea or constipation. Likely has chronic intermittent anal fissure / fistula which may or may not be related to his Crohn's disease (history of pilonidal cyst).Marland Kitchen He is no longer taking erectile dysfunction medications. He's not tried a topical nitrate previously. Tolerating Apriso very well. Recent rash after beginning Flexeril;  rash may or may not have anything to do with medications. I doubt related to his IBD medication. Chronic back pain. Normal creatinine in December of last year.  I told the patient may take another one to 2 months before he realized potential maximal benefit of oral mesalamine therapy.  He has a paucity of symptoms related to Crohn's disease directly.   Recommendations: Continue Apriso without any change in dose  NTG cream 0.125 % - apply a peas-sized amount to anorectum 4x daily - continue as needed  (no Viagra or similar medication while on NTG)  Miconazole cream to anorectum - alternating with NTG cream - use only for 1 week  See Dr. Gerarda Fraction about rash if needed  OV with [place] in 6 weeks  Basic metabolic  profile prior to next OV    Notice: This dictation was prepared with Dragon dictation along with smaller phrase technology. Any transcriptional errors that result from this process are unintentional and may not be corrected upon review.

## 2015-04-28 NOTE — Patient Instructions (Addendum)
Continue Apriso without any change in dose  NTG cream 0.125 % - apply a peas-sized amount to anorectum 4x daily - continue as needed  (no Viagra or similar medication while on NTG)  Miconazole cream to anorectum - alternating with NTG cream - use only for 1 week  See Dr. Gerarda Fraction about rash if needed  OV with [place] in 6 weeks  Basic metabolic profile prior to next OV

## 2015-05-08 ENCOUNTER — Other Ambulatory Visit: Payer: Self-pay

## 2015-05-08 DIAGNOSIS — K50919 Crohn's disease, unspecified, with unspecified complications: Secondary | ICD-10-CM

## 2015-06-02 DIAGNOSIS — K5090 Crohn's disease, unspecified, without complications: Secondary | ICD-10-CM | POA: Diagnosis not present

## 2015-06-02 DIAGNOSIS — K50919 Crohn's disease, unspecified, with unspecified complications: Secondary | ICD-10-CM | POA: Diagnosis not present

## 2015-06-03 LAB — BASIC METABOLIC PANEL
BUN: 10 mg/dL (ref 7–25)
CO2: 27 mmol/L (ref 20–31)
Calcium: 9.5 mg/dL (ref 8.6–10.3)
Chloride: 101 mmol/L (ref 98–110)
Creat: 0.85 mg/dL (ref 0.70–1.25)
Glucose, Bld: 107 mg/dL — ABNORMAL HIGH (ref 65–99)
Potassium: 4.5 mmol/L (ref 3.5–5.3)
Sodium: 138 mmol/L (ref 135–146)

## 2015-06-09 ENCOUNTER — Encounter: Payer: Self-pay | Admitting: Nurse Practitioner

## 2015-06-09 ENCOUNTER — Ambulatory Visit: Payer: Medicare Other | Admitting: Nurse Practitioner

## 2015-06-09 ENCOUNTER — Ambulatory Visit (INDEPENDENT_AMBULATORY_CARE_PROVIDER_SITE_OTHER): Payer: Medicare Other | Admitting: Nurse Practitioner

## 2015-06-09 VITALS — BP 123/76 | HR 85 | Temp 97.6°F | Ht 67.0 in | Wt 226.6 lb

## 2015-06-09 DIAGNOSIS — K50111 Crohn's disease of large intestine with rectal bleeding: Secondary | ICD-10-CM

## 2015-06-09 DIAGNOSIS — E6609 Other obesity due to excess calories: Secondary | ICD-10-CM | POA: Diagnosis not present

## 2015-06-09 DIAGNOSIS — Z1389 Encounter for screening for other disorder: Secondary | ICD-10-CM | POA: Diagnosis not present

## 2015-06-09 DIAGNOSIS — K604 Rectal fistula: Secondary | ICD-10-CM | POA: Diagnosis not present

## 2015-06-09 DIAGNOSIS — I10 Essential (primary) hypertension: Secondary | ICD-10-CM | POA: Diagnosis not present

## 2015-06-09 DIAGNOSIS — Z6834 Body mass index (BMI) 34.0-34.9, adult: Secondary | ICD-10-CM | POA: Diagnosis not present

## 2015-06-09 DIAGNOSIS — E119 Type 2 diabetes mellitus without complications: Secondary | ICD-10-CM | POA: Diagnosis not present

## 2015-06-09 DIAGNOSIS — G894 Chronic pain syndrome: Secondary | ICD-10-CM | POA: Diagnosis not present

## 2015-06-09 DIAGNOSIS — Z0001 Encounter for general adult medical examination with abnormal findings: Secondary | ICD-10-CM | POA: Diagnosis not present

## 2015-06-09 DIAGNOSIS — E785 Hyperlipidemia, unspecified: Secondary | ICD-10-CM | POA: Diagnosis not present

## 2015-06-09 NOTE — Progress Notes (Signed)
CC'ED TO PCP 

## 2015-06-09 NOTE — Progress Notes (Signed)
Referring Provider: Redmond School, MD Primary Care Physician:  [name]., MD Primary GI:  [name]  Chief Complaint  Patient presents with  . Follow-up    HPI:   [name] is a 65 y.o. male who presents for follow-up on Crohn's disease. He was last seen in our office 04/28/2015 at which point he was having minimal bowel symptoms. He did no proctalgia with bowel movement but no bowel movement changes, deemed likely chronic intermittent anal fissure/fistula. No longer taking rectal dysfunction medications. He was started on NTG cream 0.125% to apply pea-sized ointment 4 times daily to the anorectal area, continue Apriso without any change in dose, return for follow-up in 6 weeks. Also recommended basic metabolic profile prior to next office visit. This is done on 06/02/2015 and found normal except for blood glucose 107. Creatinine 0.85.  Today he states he's doing well. No overt GI symptoms. Still has proctalgia, ntg cream helps but has to take it consistently. Has headaches when he uses it. Denies abdominal pain, N/V, hematochezia, melena. Denies fever, chills, unintentional weight loss. Denies chest pain, dyspnea, dizziness, lightheadedness, syncope, near syncope. Denies any other upper or lower GI symptoms.  Past Medical History  Diagnosis Date  . Depression   . Myocardial infarct [facility])     a. MI in 1994 s/p PTCA alone  . Hypertension   . Nephrolithiasis   . Complication of anesthesia     heart rate dropped twice during surg  . Arthritis   . CAD (coronary artery disease)     a. Mid RCA CTO w/ L-R collateralization, nonobstructive dz elsewhere by 2009 cath  . Obesity   . History of tobacco abuse   . Hyperlipidemia     a. statin, niacin intolerant  . Sleep apnea     does not tol BIPAP or CPAP  . Anxiety     Past Surgical History  Procedure Laterality Date  . Knee surgery    . Back surgery    . Wrist surgery      carpal tunnel  . Cystectomy    . Nose  surgery    . Pilonidal cyst excision    . Incision and drainage abscess Right 05/23/2012    Procedure: INCISION AND DRAINAGE ABSCESS;  Surgeon: [name], MD;  Location: [facility];  Service: General;  Laterality: Right;  . Splenectomy    . Cardiac catheterization  1994, 2009    a. PTCA alone in 1994 b. RCA CTO w/ L-R collaterals, 40-50% prox RCA, 20% prox LAD; EF 50-55%, inferoapical HK  . Lower extremity arterial doppler  08/02/2007    Bilateral ABIs-no evidence of arterial insufficiency. Bilateral PVRs demonstrate normal waveforms.  . Cardiac catheterization  04/02/2001    Continue medical therapy.  . Cardiac catheterization  11/07/2000    Continue medical therapy.  . Cardiac catheterization  09/24/1996    Continue medical therapy  . Cardiovascular stress test  04/06/2010    No scintigraphic evidence of inducible myocardial ischemia. No ECG changes. EKG negative for ischemia.  . Transthoracic echocardiogram  03/05/2009    EF >55%, normal LV wall thickness.  . Anal fistulotomy N/A 06/28/2013    Procedure: ANAL FISTULOTOMY;  Surgeon: [name], MD;  Location: [facility];  Service: General;  Laterality: N/A;  . Carpal tunnel release Left 10/09/2014    Procedure: LEFT CARPAL TUNNEL RELEASE;  Surgeon: [name], MD;  Location: [place];  Service: Orthopedics;  Laterality: Left;  .  Colonoscopy N/A 11/06/2014    RMR: Skipped Colonic ulcerations with significant ileocecal valve involvement and rectal sparing most consisitant with Crohns disease. Minimal non-steroidal drug use. Probable colonic lipoma    Current Outpatient Prescriptions  Medication Sig Dispense Refill  . albuterol (PROVENTIL HFA;VENTOLIN HFA) 108 (90 BASE) MCG/ACT inhaler Inhale 1 puff into the lungs every 6 (six) hours as needed for wheezing or shortness of breath.    . ALPRAZolam (XANAX) 0.5 MG tablet Take 0.5 mg by mouth 4 (four) times daily as needed for anxiety or sleep.    Marland Kitchen amLODipine (NORVASC) 5 MG tablet  Take 1 tablet (5 mg total) by mouth daily. 180 tablet 3  . citalopram (CELEXA) 10 MG tablet Take 10 mg by mouth daily.    Marland Kitchen ezetimibe (ZETIA) 10 MG tablet Take 10 mg by mouth daily.    . mesalamine (APRISO) 0.375 g 24 hr capsule Take 4 capsules (1.5 g total) by mouth daily. 120 capsule 11  . metoprolol succinate (TOPROL-XL) 50 MG 24 hr tablet Take 50 mg by mouth daily. Take with or immediately following a meal.    . nitroGLYCERIN (NITROSTAT) 0.4 MG SL tablet Place 0.4 mg under the tongue every 5 (five) minutes as needed for chest pain.    . ramipril (ALTACE) 10 MG capsule TAKE 1 CAPSULE DAILY 90 capsule 2  . VIAGRA 100 MG tablet Take 1 tablet by mouth as needed.     No current facility-administered medications for this visit.    Allergies as of 06/09/2015 - Review Complete 06/09/2015  Allergen Reaction Noted  . Niacin and related Other (See Comments) 05/22/2012  . Penicillins    . Statins  03/28/2013    Family History  Problem Relation Age of Onset  . CAD Father     CABG   . CAD Brother     CABG in 21s  . CAD Brother   . Arthritis/Rheumatoid Mother   . Hyperlipidemia Mother   . Thrombocytopenia Mother   . COPD Son     smoker  . Alzheimer's disease Maternal Grandmother   . Lung disease Paternal Grandfather     Social History   Social History  . Marital Status: Married    Spouse Name: N/A  . Number of Children: N/A  . Years of Education: N/A   Social History Main Topics  . Smoking status: Former Smoker -- 2.00 packs/day for 28 years    Types: Cigarettes    Quit date: 03/28/1990  . Smokeless tobacco: Former Systems developer    Types: Chew  . Alcohol Use: No  . Drug Use: No  . Sexual Activity: Yes   Other Topics Concern  . None   Social History Narrative   Lives in [place], [name]. Married, 2 children.     Review of Systems: General: Negative for anorexia, weight loss, fever, chills, fatigue, weakness. ENT: Negative for hoarseness, difficulty swallowing , nasal  congestion. CV: Negative for chest pain, angina, palpitations, dyspnea on exertion, peripheral edema.  Respiratory: Negative for dyspnea at rest, dyspnea on exertion, cough, sputum, wheezing.  GI: See history of present illness. Derm: Negative for rash or itching.  Endo: Negative for unusual weight change.  Heme: Negative for bruising or bleeding.   Physical Exam: BP 123/76 mmHg  Pulse 85  Temp(Src) 97.6 F (36.4 C) (Oral)  Ht 5\' 7"  (1.702 m)  Wt 226 lb 9.6 oz (102.785 kg)  BMI 35.48 kg/m2 General:   Alert and oriented. Pleasant and cooperative. Well-nourished and well-developed.  Ears:  Normal auditory acuity. Cardiovascular:  S1, S2 present without murmurs appreciated. Extremities without clubbing or edema. Respiratory:  Clear to auscultation bilaterally. No wheezes, rales, or rhonchi. No distress.  Gastrointestinal:  +BS, soft, non-tender and non-distended. No HSM noted. No guarding or rebound. No masses appreciated.  Rectal:  Deferred  Skin:  Intact without significant lesions or rashes. Neurologic:  Alert and oriented x4;  grossly normal neurologically. Psych:  Alert and cooperative. Normal mood and affect. Heme/Lymph/Immune: No excessive bruising noted.    06/09/2015 3:00 PM   Disclaimer: This note was dictated with voice recognition software. Similar sounding words can inadvertently be transcribed and may not be corrected upon review.

## 2015-06-09 NOTE — Patient Instructions (Signed)
1. Continue taking Apriso at the same dose. 2. I sent in a prescription to [facility] to be compounded for diltiazem 2% cream with lidocaine 5%. Take it twice a day up to 10 days at a time. 3. Return for follow-up in 6 months.

## 2015-06-09 NOTE — Assessment & Plan Note (Signed)
String of rectal fistula and fissure. Has seen 2 surgeons and "didn't care for either of them." Surgery may be an option if his symptoms are persistent. Does still have proctalgia with bowel movements. Nitroglycerin helped significantly, but causes headaches. At this point I will try compounded diltiazem 2% with lidocaine 5% to [facility]. He is to call let us know how it is doing. Return for follow-up in 6 months.

## 2015-06-09 NOTE — Assessment & Plan Note (Signed)
Patient with Crohn's disease, last colonoscopy on 11/06/2014 currently up-to-date. Is on Apriso with good results. Minimal GI symptoms, if any. Return for follow-up in 6 months, continue present with current maintenance dose.

## 2015-06-25 ENCOUNTER — Encounter: Payer: Self-pay | Admitting: Cardiovascular Disease

## 2015-06-25 ENCOUNTER — Ambulatory Visit (INDEPENDENT_AMBULATORY_CARE_PROVIDER_SITE_OTHER): Payer: Medicare Other | Admitting: Cardiovascular Disease

## 2015-06-25 VITALS — BP 147/89 | HR 73 | Ht 69.0 in | Wt 228.0 lb

## 2015-06-25 DIAGNOSIS — G4733 Obstructive sleep apnea (adult) (pediatric): Secondary | ICD-10-CM

## 2015-06-25 DIAGNOSIS — K50111 Crohn's disease of large intestine with rectal bleeding: Secondary | ICD-10-CM | POA: Diagnosis not present

## 2015-06-25 DIAGNOSIS — E785 Hyperlipidemia, unspecified: Secondary | ICD-10-CM

## 2015-06-25 DIAGNOSIS — I2510 Atherosclerotic heart disease of native coronary artery without angina pectoris: Secondary | ICD-10-CM | POA: Diagnosis not present

## 2015-06-25 DIAGNOSIS — E669 Obesity, unspecified: Secondary | ICD-10-CM

## 2015-06-25 DIAGNOSIS — I10 Essential (primary) hypertension: Secondary | ICD-10-CM

## 2015-06-25 MED ORDER — EZETIMIBE 10 MG PO TABS: 10.0000 mg | ORAL_TABLET | Freq: Every day | ORAL | Status: DC

## 2015-06-25 NOTE — Patient Instructions (Signed)
Your physician has recommended that you have a sleep study. This test records several body functions during sleep, including: brain activity, eye movement, oxygen and carbon dioxide blood levels, heart rate and rhythm, breathing rate and rhythm, the flow of air through your mouth and nose, snoring, body muscle movements, and chest and belly movement.   Your physician recommends that you schedule a follow-up appointment in September office sleep clinic.

## 2015-06-27 ENCOUNTER — Encounter: Payer: Self-pay | Admitting: Cardiovascular Disease

## 2015-06-27 NOTE — Progress Notes (Signed)
Patient ID: [name], male   DOB: [birth year], 65 y.o.   MRN: [id number]     HPI: [name] is a 65 y.o. male who is a former patient of Dr. Rollene Fare who presents to the office today for one-year follow-up evaluation.  [name] has CAD and suffered an inferior wall MI in 1994.  Cardiac catheterization revealed RCA occlusion with left-to-right collaterals. He was treated initially with PTCA of his RCA. His last cardiac catheterization was in 2009 which showed an RCA occlusion with collaterals with no significant LAD disease, although there was mild 20% diagonal stenosis.  An echo Doppler study done in August 2014 by Dr. Rollene Fare showed mild LVH with mild hypokinesis of the inferolateral wall but with preserved global systolic function with an EF of 55-60%. He had normal diastolic parameters.  In January 2015 he was admitted by the hospitalist service with chest pain. A stress Myoview study did not demonstrate convincing inducible myocardial ischemia. There was limited evaluation of the inferior wall due to movement and overlap of the diaphragm and activity within the left lobe of the liver. Ejection fraction was 47%. He was seen by Dr. Rayann Heman for cardiology consultation who recommended no further cardiac workup.  Lipid evaluation reveals a total cholesterol 163 triglycerides 100 HDL 31 LDL 112 in the past, he had some difficulty taking statins.  Additional problems include hypertension, obesity, and arthritis. He has a remote history of right buttock abscess leading to hospitalization in December 2013.  When I initially saw him, I discontinued his gemfibrozil and recommended a trial of Livalo to take and hopefully tolerate  along with coenzyme Q10.  I titrated his amlodipine to 5 mg daily for more optimal blood pressure control.  I was also very concerned about obstructive sleep apnea and referred him for a sleep study.  Due to my concerns for obstructive sleep apnea, he underwent a split-night  protocol, on 05/10/2012.  This revealed severe sleep apnea with an AHI of 75.6 per hour.  During REM sleep this was 40 per hour.  He had significant oxygen desaturation to 78% with non-REM sleep and 76% with REM sleep.  He also had a significant positional component to his sleep apnea.  He was started on CPAP titration but due to high-pressure he was was switched to a BiPAP mode.  Ultimately, a BiPAP auto unit was recommended with an EPAP minimum of 10 and a IPAP maximum up to 25.  He also did have periodic lid movements with sleep with an index of 18.  He apparently initially used BiPAP for only 3 weeks but was having difficulty with his mass.  However, before you in being seen in follow-up evaluation.  He returned his BiPAP unit due to concerns that he was not compliant and would have to pay for this without insurance.  Since I last saw him, he denies any episodes of chest pain.  He admits to mild shortness of breath with activity.  He was diagnosed with Crohn's disease following a recent colonoscopy.  He has had issues with anal fissures and fistula.  He states that his sleep is poor.   He does not sleep on his back.  He snores.  He is unaware of palpitations.  He presents for one-year evaluation.  Past Medical History  Diagnosis Date  . Depression   . Myocardial infarct [facility])     a. MI in 1994 s/p PTCA alone  . Hypertension   . Nephrolithiasis   .  Complication of anesthesia     heart rate dropped twice during surg  . Arthritis   . CAD (coronary artery disease)     a. Mid RCA CTO w/ L-R collateralization, nonobstructive dz elsewhere by 2009 cath  . Obesity   . History of tobacco abuse   . Hyperlipidemia     a. statin, niacin intolerant  . Sleep apnea     does not tol BIPAP or CPAP  . Anxiety     Past Surgical History  Procedure Laterality Date  . Knee surgery    . Back surgery    . Wrist surgery      carpal tunnel  . Cystectomy    . Nose surgery    . Pilonidal cyst excision    .  Incision and drainage abscess Right 05/23/2012    Procedure: INCISION AND DRAINAGE ABSCESS;  Surgeon: [name], MD;  Location: [facility];  Service: General;  Laterality: Right;  . Splenectomy    . Cardiac catheterization  1994, 2009    a. PTCA alone in 1994 b. RCA CTO w/ L-R collaterals, 40-50% prox RCA, 20% prox LAD; EF 50-55%, inferoapical HK  . Lower extremity arterial doppler  08/02/2007    Bilateral ABIs-no evidence of arterial insufficiency. Bilateral PVRs demonstrate normal waveforms.  . Cardiac catheterization  04/02/2001    Continue medical therapy.  . Cardiac catheterization  11/07/2000    Continue medical therapy.  . Cardiac catheterization  09/24/1996    Continue medical therapy  . Cardiovascular stress test  04/06/2010    No scintigraphic evidence of inducible myocardial ischemia. No ECG changes. EKG negative for ischemia.  . Transthoracic echocardiogram  03/05/2009    EF >55%, normal LV wall thickness.  . Anal fistulotomy N/A 06/28/2013    Procedure: ANAL FISTULOTOMY;  Surgeon: [name], MD;  Location: [facility];  Service: General;  Laterality: N/A;  . Carpal tunnel release Left 10/09/2014    Procedure: LEFT CARPAL TUNNEL RELEASE;  Surgeon: [name], MD;  Location: [facility];  Service: Orthopedics;  Laterality: Left;  . Colonoscopy N/A 11/06/2014    RMR: Skipped Colonic ulcerations with significant ileocecal valve involvement and rectal sparing most consisitant with Crohns disease. Minimal non-steroidal drug use. Probable colonic lipoma    Allergies  Allergen Reactions  . Niacin And Related Other (See Comments)    flush  . Penicillins   . Statins     myalgias    Current Outpatient Prescriptions  Medication Sig Dispense Refill  . albuterol (PROVENTIL HFA;VENTOLIN HFA) 108 (90 BASE) MCG/ACT inhaler Inhale 1 puff into the lungs every 6 (six) hours as needed for wheezing or shortness of breath.    . ALPRAZolam (XANAX) 0.5 MG tablet Take 0.5 mg by mouth 4  (four) times daily as needed for anxiety or sleep.    Marland Kitchen amLODipine (NORVASC) 5 MG tablet Take 1 tablet (5 mg total) by mouth daily. 180 tablet 3  . citalopram (CELEXA) 10 MG tablet Take 10 mg by mouth daily.    . mesalamine (APRISO) 0.375 g 24 hr capsule Take 4 capsules (1.5 g total) by mouth daily. 120 capsule 11  . metoprolol succinate (TOPROL-XL) 50 MG 24 hr tablet Take 50 mg by mouth daily. Take with or immediately following a meal.    . nitroGLYCERIN (NITROSTAT) 0.4 MG SL tablet Place 0.4 mg under the tongue every 5 (five) minutes as needed for chest pain.    . ramipril (ALTACE) 10 MG capsule TAKE  1 CAPSULE DAILY 90 capsule 2  . VIAGRA 100 MG tablet Take 1 tablet by mouth as needed.    . ezetimibe (ZETIA) 10 MG tablet Take 1 tablet (10 mg total) by mouth daily. 90 tablet 3   No current facility-administered medications for this visit.    Social History   Social History  . Marital Status: Married    Spouse Name: N/A  . Number of Children: N/A  . Years of Education: N/A   Occupational History  . Not on file.   Social History Main Topics  . Smoking status: Former Smoker -- 2.00 packs/day for 28 years    Types: Cigarettes    Quit date: 03/28/1990  . Smokeless tobacco: Former Systems developer    Types: Chew  . Alcohol Use: No  . Drug Use: No  . Sexual Activity: Yes   Other Topics Concern  . Not on file   Social History Narrative   Lives in [place], [name]. Married, 2 children.     Family History  Problem Relation Age of Onset  . CAD Father     CABG   . CAD Brother     CABG in 31s  . CAD Brother   . Arthritis/Rheumatoid Mother   . Hyperlipidemia Mother   . Thrombocytopenia Mother   . COPD Son     smoker  . Alzheimer's disease Maternal Grandmother   . Lung disease Paternal Grandfather    ROS General: Negative; No fevers, chills, or night sweats HEENT: Negative; No changes in vision or hearing, sinus congestion, difficulty swallowing Pulmonary: Negative; No cough, wheezing,  shortness of breath, hemoptysis Cardiovascular: Negative; No chest pain, presyncope, syncope, palpatations GI: Negative; No nausea, vomiting, diarrhea, or abdominal pain GU: Negative; No dysuria, hematuria, or difficulty voiding Musculoskeletal: Negative; no myalgias, joint pain, or weakness Hematologic: Negative; no easy bruising, bleeding Endocrine: Negative; no heat/cold intolerance Neuro: Negative; no changes in balance, headaches Skin: Negative; No rashes or skin lesions Psychiatric: Negative; No behavioral problems, depression Sleep: positive severe sleep apnea, currently untreated;  daytime sleepiness, hypersomnolence, and snoring, no bruxism, he admits to limb movement, but denies painful restless legs, no hypnogognic hallucinations, no cataplexy   PE BP 147/89 mmHg  Pulse 73  Ht '5\' 9"'  (1.753 m)  Wt 228 lb (103.42 kg)  BMI 33.65 kg/m2   Wt Readings from Last 3 Encounters:  06/25/15 228 lb (103.42 kg)  06/09/15 226 lb 9.6 oz (102.785 kg)  04/28/15 226 lb (102.513 kg)   General: Alert, oriented, no distress.  Skin: normal turgor, no rashes HEENT: Normocephalic, atraumatic. Pupils round and reactive; sclera anicteric;no lid lag. Extraocular muscles intact;; no xanthelasmas. Nose without nasal septal hypertrophy Mouth/Parynx benign; Mallinpatti scale 3 Neck: No JVD, no carotid bruits; normal carotid upstroke Lungs: clear to ausculatation and percussion; no wheezing or rales Chest wall: no tenderness to palpitation; pectus excavatum chest wall deformity Heart: RRR, s1 s2 normal; 1/6 systolic murmur;no diastolic murmur, rub thrills or heaves Abdomen: soft, nontender; no hepatosplenomehaly, BS+; abdominal aorta nontender and not dilated by palpation. Back: no CVA tenderness Pulses 2+ Extremities: no clubbing cyanosis or edema, Homan's sign negative  Neurologic: grossly nonfocal; cranial nerves grossly normal. Psychologic: normal affect and mood.  ECG (independently read by  me): Normal sinus rhythm at 73 bpm.  Small nondiagnostic inferior Q waves.  No ST segment changes.  April 2016 ECG (independently read by me): Normal sinus rhythm at 68 bpm.  Nonspecific T changes.  Normal intervals.  April 2015 ECG (independently  read by me): Normal sinus rhythm at 61 beats per minute.  Normal intervals  Prior 03/28/2013 ECG (independently read by me): Sinus rhythm at 66 beats per minute. No ectopy. Q-wave in lead 3   LABS:  BMP Latest Ref Rng 06/02/2015 01/20/2015 11/13/2014  Glucose 65 - 99 mg/dL 107(H) 159(H) -  BUN 7 - 25 mg/dL 10 9 -  Creatinine 0.70 - 1.25 mg/dL 0.85 0.87 1.00  Sodium 135 - 146 mmol/L 138 138 -  Potassium 3.5 - 5.3 mmol/L 4.5 4.3 -  Chloride 98 - 110 mmol/L 101 101 -  CO2 20 - 31 mmol/L 27 27 -  Calcium 8.6 - 10.3 mg/dL 9.5 9.0 -    Hepatic Function Latest Ref Rng 01/20/2015 06/10/2014 02/15/2013  Total Protein 6.1 - 8.1 g/dL 6.9 7.3 8.2  Albumin 3.6 - 5.1 g/dL 3.8 4.2 4.0  AST 10 - 35 U/L '18 19 20  ' ALT 9 - 46 U/L '14 17 18  ' Alk Phosphatase 40 - 115 U/L 104 102 114  Total Bilirubin 0.2 - 1.2 mg/dL 0.4 0.5 0.2(L)    CBC Latest Ref Rng 01/20/2015 10/09/2014 06/10/2014  WBC 4.0 - 10.5 K/uL 6.3 - 6.3  Hemoglobin 13.0 - 17.0 g/dL 15.1 14.8 15.4  Hematocrit 39.0 - 52.0 % 43.6 - 44.6  Platelets 150 - 400 K/uL 465(H) - 444(H)    Lab Results  Component Value Date   MCV 88.8 01/20/2015   MCV 90.1 06/10/2014   MCV 89.5 06/26/2013    Lab Results  Component Value Date   TSH 1.315 06/10/2014   No results found for: HGBA1C   Lipid Panel     Component Value Date/Time   CHOL 192 06/10/2014 0811   TRIG 170* 06/10/2014 0811   HDL 32* 06/10/2014 0811   CHOLHDL 6.0 06/10/2014 0811   VLDL 34 06/10/2014 0811   LDLCALC 126* 06/10/2014 0811   BNP No results found for: PROBNP   RADIOLOGY: No results found.    ASSESSMENT AND PLAN: [name] is a 65 year old gentleman who suffered an inferior wall myocardial infarction in 1994 due to total  RCA occlusion with good left to right collaterals without significant contralateral disease. His last nuclear perfusion study did not demonstrate significant ischemia. However, ejection fraction was 47%. His echo Doppler study did show inferolateral wall motion abnormality.  Over the past year, he is been fairly stable without recurrent anginal symptoms and admits to his blood pressure being stable.  He is on amlodipine 5 mg, Toprol-XL 50 mg, ramipril 10 mg daily, both for blood pressure and CAD.  He has not required any nitrates.  He has a prescription for Viagra for erectile dysfunction but has never used this. He developed significant myalgias to statins and is not on therapy.  His LDL last year was elevated at 126.  He may be a candidate for Zetia.  I again reviewed his initial sleep study which demonstrated severe sleep apnea.  I again have strongly encouraged resumption of therapy.  I am scheduling him for a split-night study with BiPAP titration since previously CPAP was inadequate.  Dr. Gerarda Fraction was recently checked blood work and I will try to obtain recent laboratory.  He is mildly obese with a BMI of 33.65, and weight reduction was recommended.  I will see him in follow-up of his sleep study in a sleep clinic for further evaluation.  Troy Sine, MD, [facility]  06/27/2015 11:12 PM

## 2015-07-01 ENCOUNTER — Telehealth: Payer: Self-pay | Admitting: Cardiovascular Disease

## 2015-07-01 NOTE — Telephone Encounter (Signed)
New message    Pt wife is calling for a referral or for Dr.Kelly to do a cholesterol test to see   what alternatives he can take medication wise because it has been high  Please call before the weekend

## 2015-07-01 NOTE — Telephone Encounter (Signed)
Wife states patient had lab results - lipid abnormal Patient unable to statin per wife ,had been on zetia but the coat now is $300 - 400 a month Primary had sent a referral to office for Dr Claiborne Billings TO ADDRESS RN informed wife will make an appointment with lipid clinic - patient recently seen Dr Claiborne Billings 06/25/15- appointment schedule with  Lipid clinic 07/16/15 8:30 am Will obtain lab result and place them to be scan in computer for visit

## 2015-07-09 ENCOUNTER — Encounter: Payer: Self-pay | Admitting: Cardiovascular Disease

## 2015-07-16 ENCOUNTER — Ambulatory Visit (INDEPENDENT_AMBULATORY_CARE_PROVIDER_SITE_OTHER): Payer: Medicare Other | Admitting: Pharmacist

## 2015-07-16 ENCOUNTER — Encounter: Payer: Self-pay | Admitting: Pharmacist

## 2015-07-16 VITALS — Wt 229.0 lb

## 2015-07-16 DIAGNOSIS — I2510 Atherosclerotic heart disease of native coronary artery without angina pectoris: Secondary | ICD-10-CM

## 2015-07-16 DIAGNOSIS — E785 Hyperlipidemia, unspecified: Secondary | ICD-10-CM

## 2015-07-16 MED ORDER — ROSUVASTATIN CALCIUM 5 MG PO TABS: ORAL_TABLET | ORAL | Status: DC

## 2015-07-16 NOTE — Progress Notes (Signed)
Patient ID: [name]                 DOB: February 22, 1950                    MRN: [id number]     HPI: [name] is a 66 y.o. male patient referred to lipid clinic by Dr. Claiborne Billings. He has a cardiac history as below. He recently became medicare eligible and his copay for Zetia became $317 per month, which is not affordable to him. He has been off the medication since about December.   Briefly discussed PCSK-9i with patient and he wishes to avoid injectable medications for the time being.  Current Medications:  Fish Oil - started 2 weeks ago Niacin - started 2 days ago  Intolerances:  Lipitor, Crestor, simvastatin, pravastatin, Welchol - had debilitating pains in his knees, shoulders, hips which were unbearable - these issues improved after discontinuing the medications Risk Factors: CAD, MI in 1994  LDL goal: <70  Diet: He states he loves food and is frequently on a "see food" diet (eats everything he sees). He and his wife eat most of their meals at home and he states breakfast is his biggest meal of the day. He has switched to unsweet tea, but still admits to a high sugar diet with sweets and a lot of carbs.   Exercise: He does not exercise regularly though he does like to walk. He states he hasn't been walking because his wife has not been able to go with him.   Family History: Father - significant cardiac history and passed from "over working his heart" Mother with hypertension Brother with open heart surgery; brother with hypertension  Social History: Quit smoking in 1994 after heart attack; never smokeless tobacco, not a regular drinker. He did try nightly red wine several years ago for sleep and cardiac benefit as recommended by a doctor but stopped as he did not think it was effective.   Labs:  TC 230 TG 195 HDL 35 LDL 156  Past Medical History  Diagnosis Date  . Depression   . Myocardial infarct [facility])     a. MI in 1994 s/p PTCA alone  . Hypertension   . Nephrolithiasis     . Complication of anesthesia     heart rate dropped twice during surg  . Arthritis   . CAD (coronary artery disease)     a. Mid RCA CTO w/ L-R collateralization, nonobstructive dz elsewhere by 2009 cath  . Obesity   . History of tobacco abuse   . Hyperlipidemia     a. statin, niacin intolerant  . Sleep apnea     does not tol BIPAP or CPAP  . Anxiety     Current Outpatient Prescriptions on File Prior to Visit  Medication Sig Dispense Refill  . albuterol (PROVENTIL HFA;VENTOLIN HFA) 108 (90 BASE) MCG/ACT inhaler Inhale 1 puff into the lungs every 6 (six) hours as needed for wheezing or shortness of breath.    . ALPRAZolam (XANAX) 0.5 MG tablet Take 0.5 mg by mouth 4 (four) times daily as needed for anxiety or sleep.    Marland Kitchen amLODipine (NORVASC) 5 MG tablet Take 1 tablet (5 mg total) by mouth daily. 180 tablet 3  . citalopram (CELEXA) 10 MG tablet Take 10 mg by mouth daily.    Marland Kitchen ezetimibe (ZETIA) 10 MG tablet Take 1 tablet (10 mg total) by mouth daily. 90 tablet 3  . mesalamine (APRISO) 0.375  g 24 hr capsule Take 4 capsules (1.5 g total) by mouth daily. 120 capsule 11  . metoprolol succinate (TOPROL-XL) 50 MG 24 hr tablet Take 50 mg by mouth daily. Take with or immediately following a meal.    . nitroGLYCERIN (NITROSTAT) 0.4 MG SL tablet Place 0.4 mg under the tongue every 5 (five) minutes as needed for chest pain.    . ramipril (ALTACE) 10 MG capsule TAKE 1 CAPSULE DAILY 90 capsule 2  . VIAGRA 100 MG tablet Take 1 tablet by mouth as needed.     No current facility-administered medications on file prior to visit.    Allergies  Allergen Reactions  . Niacin And Related Other (See Comments)    flush  . Penicillins   . Statins     myalgias    Assessment/Plan: Hyperlipidemia:  Most recent LDL is not at goal. Patient wishing to retry statin medications rather than pay high copay or do injectable medications. Will start at very low dose of Crestor and see if he is able to tolerate it.  Crestor 5mg  three times a week. Labs in 2-3 months. Follow-up in lipid clinic after labs.   Thank you, [name]. [name], [facility]  07/16/2015 8:48 AM

## 2015-07-16 NOTE — Assessment & Plan Note (Signed)
Most recent LDL is not at goal. Patient wishing to retry statin medications rather than pay high copay or do injectable medications. Will start at very low dose of Crestor and see if he is able to tolerate it. Crestor 5mg  three times a week. Labs in 2-3 months. Follow-up in lipid clinic after labs.

## 2015-07-16 NOTE — Patient Instructions (Signed)
Start taking Crestor 5mg  3 times a week.   Call [phone] if you have any issues.

## 2015-08-10 ENCOUNTER — Ambulatory Visit (HOSPITAL_BASED_OUTPATIENT_CLINIC_OR_DEPARTMENT_OTHER): Payer: Medicare Other | Attending: Cardiovascular Disease | Admitting: Cardiovascular Disease

## 2015-08-10 DIAGNOSIS — I493 Ventricular premature depolarization: Secondary | ICD-10-CM | POA: Insufficient documentation

## 2015-08-10 DIAGNOSIS — G4733 Obstructive sleep apnea (adult) (pediatric): Secondary | ICD-10-CM | POA: Diagnosis not present

## 2015-08-10 DIAGNOSIS — G4761 Periodic limb movement disorder: Secondary | ICD-10-CM | POA: Diagnosis not present

## 2015-08-10 DIAGNOSIS — Z79899 Other long term (current) drug therapy: Secondary | ICD-10-CM | POA: Diagnosis not present

## 2015-08-10 DIAGNOSIS — R0683 Snoring: Secondary | ICD-10-CM | POA: Insufficient documentation

## 2015-08-12 ENCOUNTER — Encounter (HOSPITAL_BASED_OUTPATIENT_CLINIC_OR_DEPARTMENT_OTHER): Payer: Self-pay | Admitting: Cardiovascular Disease

## 2015-08-12 NOTE — Procedures (Signed)
Patient Name: [name], [name] Date: 08/10/2015 Gender: Male D.O.B: [birth year] Age (years): 38 Referring Provider: Shelva Majestic MD, ABSM Height (inches): 69 Interpreting Physician: Shelva Majestic MD, ABSM Weight (lbs): 224 RPSGT: Joni Reining BMI: 33 MRN: Neck Size: 16.50  CLINICAL INFORMATION The patient is referred for a split night study with BPAP.  He was previously diagnosed with severe sleep apnea in 2014 and had an AHI of 75.6 per hour.  He had only used BiPAP for 3 weeks before turning his machine back in.  He is now referred for follow-up study due to continued very poor sleep.  MEDICATIONS  VIAGRA 100 MG tablet 1 tablet, As needed     Note: Received from: External Pharmacy Received Sig: (Written 05/16/2014 0927)   rosuvastatin (CRESTOR) 5 MG tablet      ramipril (ALTACE) 10 MG capsule      Omega-3 Fatty Acids (FISH OIL) 1200 MG CAPS 1,200 mg, Daily     nitroGLYCERIN (NITROSTAT) 0.4 MG SL tablet 0.4 mg, Every 5 min PRN     niacin 500 MG CR capsule 500 mg, Daily at bedtime     metoprolol succinate (TOPROL-XL) 50 MG 24 hr tablet 50 mg, Daily     mesalamine (APRISO) 0.375 g 24 hr capsule 1.5 g, Daily     ezetimibe (ZETIA) 10 MG tablet 10 mg, Daily     Patient not taking. Reported on 07/16/2015   citalopram (CELEXA) 10 MG tablet 10 mg, Daily     amLODipine (NORVASC) 5 MG tablet 5 mg, Daily     ALPRAZolam (XANAX) 0.5 MG tablet 0.5 mg, 4 times daily PRN     albuterol (PROVENTIL HFA;VENTOLIN HFA) 108 (90 BASE) MCG/ACT inhaler 1 puff, Every 6 hours PRN   Medications administered by patient during sleep study : No sleep medicine administered.  SLEEP STUDY TECHNIQUE As per the AASM Manual for the Scoring of Sleep and Associated Events v2.3 (April 2016) with a hypopnea requiring 4% desaturations. The channels recorded and monitored were frontal, central and occipital EEG, electrooculogram (EOG), submentalis EMG (chin), nasal and oral airflow, thoracic and abdominal wall  motion, anterior tibialis EMG, snore microphone, electrocardiogram, and pulse oximetry. Bi-level positive airway pressure (BiPAP) was initiated when the patient met split night criteria and was titrated according to treat sleep-disordered breathing.  RESPIRATORY PARAMETERS Diagnostic Total AHI (/hr): 95.7 RDI (/hr): 96.2 OA Index (/hr): 92.6 CA Index (/hr): 0.0 REM AHI (/hr): N/A NREM AHI (/hr): 95.7 Supine AHI (/hr): 77.6 Non-supine AHI (/hr): 102.97 Min O2 Sat (%): 78.00 Mean O2 (%): 88.63 Time below 88% (min): 64.8    Titration Optimal IPAP Pressure (cm): 15 Optimal EPAP Pressure (cm): 11 AHI at Optimal Pressure (/hr): 0.0 Min O2 at Optimal Pressure (%): 89.0 Sleep % at Optimal (%): 82 Supine % at Optimal (%): 53      SLEEP ARCHITECTURE The study was initiated at 9:35:23 PM and terminated at 4:34:23 AM. The total recorded time was 419.0 minutes. EEG confirmed total sleep time was 275.3 minutes yielding a sleep efficiency of 65.7%. Sleep onset after lights out was 43.7 minutes with a REM latency of 256.0 minutes. The patient spent 9.08% of the night in stage N1 sleep, 68.58% in stage N2 sleep, 0.00% in stage N3 and 22.34% in REM. Wake after sleep onset (WASO) was 100.0 minutes. The Arousal Index was 58.2/hour.  LEG MOVEMENT DATA The total Periodic Limb Movements of Sleep (PLMS) were 75. The PLMS index was 16.35.  CARDIAC DATA The  2 lead EKG demonstrated sinus rhythm. The mean heart rate was 67.37 beats per minute. Other EKG findings include: PVCs.  IMPRESSIONS - Severe obstructive sleep apnea occurred during the diagnostic portion of the study (AHI = 95.7 /hour). The patient underwent BiPAP titration and was titrated up to 17/13.  The AHI at 17/13 pressure was 4.8, and oxygen saturation nadir was 87%. - No significant central sleep apnea occurred during the diagnostic portion of the study (CAI = 0.0/hour). - Significant oxygen desaturation during the diagnostic portion to a nadir of 78.00%.  Time below 88% was 64.8 minutes. - Reduced sleep efficiency during the diagnostic portion with normalization of sleep efficiency with therapy. - Abnormal sleep architecture with absence of slow-wave sleep and REM sleep on the diagnostic portion of the study. - The patient snored with Loud snoring volume during the diagnostic portion of the study. - EKG findings include PVCs. - Mild periodic limb movements of sleep with an index of 16.35.  DIAGNOSIS - Obstructive Sleep Apnea (327.23 [G47.33 ICD-10])  RECOMMENDATIONS - Recommend an initial trial of BiPAP Auto therapy with an EPAP min of 10 cm and IPAP max of 25 cm and a delta of 4 cm with heated humidification.  A Medium size Fisher&Paykel Full Face Mask Simplus mask was used for the titration study. - Effort should be made to optimize nasal and oropharyngeal patency - Avoid alcohol, sedatives and other CNS depressants that may worsen sleep apnea and disrupt normal sleep architecture. - Sleep hygiene should be reviewed to assess factors that may improve sleep quality. - Weight management (BMI 33) and regular exercise should be initiated or continued. - Recommend a download be obtained in 30 days and sleep clinic follow-up evaluation.   Troy Sine, MD, [place], American Board of Sleep Medicine  ELECTRONICALLY SIGNED ON:  08/12/2015, 2:17 PM [facility] [place] PH: [phone]   FX: [phone] [place]

## 2015-08-20 ENCOUNTER — Telehealth: Payer: Self-pay | Admitting: *Deleted

## 2015-08-20 NOTE — Progress Notes (Signed)
patient's BIPAP referral sent to [facility] in [place].

## 2015-08-20 NOTE — Telephone Encounter (Signed)
BIPAP referral sent to [facility] in [place].

## 2015-08-27 ENCOUNTER — Telehealth: Payer: Self-pay | Admitting: *Deleted

## 2015-08-27 ENCOUNTER — Other Ambulatory Visit: Payer: Self-pay | Admitting: *Deleted

## 2015-08-27 ENCOUNTER — Encounter: Payer: Self-pay | Admitting: *Deleted

## 2015-08-27 DIAGNOSIS — G4733 Obstructive sleep apnea (adult) (pediatric): Secondary | ICD-10-CM

## 2015-08-27 NOTE — Telephone Encounter (Signed)
Received a notice from [facility] on 08/24/15 informing they cannot provide BIPAP services due to patient's insurance. New referral done to [facility]. Patient notified.

## 2015-09-09 DIAGNOSIS — E6609 Other obesity due to excess calories: Secondary | ICD-10-CM | POA: Diagnosis not present

## 2015-09-09 DIAGNOSIS — J300 Vasomotor rhinitis: Secondary | ICD-10-CM | POA: Diagnosis not present

## 2015-09-09 DIAGNOSIS — J310 Chronic rhinitis: Secondary | ICD-10-CM | POA: Diagnosis not present

## 2015-09-09 DIAGNOSIS — J302 Other seasonal allergic rhinitis: Secondary | ICD-10-CM | POA: Diagnosis not present

## 2015-09-09 DIAGNOSIS — Z1389 Encounter for screening for other disorder: Secondary | ICD-10-CM | POA: Diagnosis not present

## 2015-09-09 DIAGNOSIS — Z6836 Body mass index (BMI) 36.0-36.9, adult: Secondary | ICD-10-CM | POA: Diagnosis not present

## 2015-10-06 ENCOUNTER — Telehealth: Payer: Self-pay | Admitting: Pharmacist

## 2015-10-06 NOTE — Telephone Encounter (Signed)
LMOM about getting lipid and hepatic labs and f/u in lipid clinic.

## 2015-10-13 NOTE — Telephone Encounter (Signed)
Spoke pt wife - she reports that he recently went off the crestor due to muscle aches. He stopped it about 2 weeks ago.   Will order labs for about 2 months from now to see results off medication.   Also asked she revisit injectible medications with him to see if he has changed his mind about them.

## 2015-10-14 DIAGNOSIS — E669 Obesity, unspecified: Secondary | ICD-10-CM | POA: Diagnosis not present

## 2015-10-14 DIAGNOSIS — G5603 Carpal tunnel syndrome, bilateral upper limbs: Secondary | ICD-10-CM | POA: Diagnosis not present

## 2015-10-14 DIAGNOSIS — Z1389 Encounter for screening for other disorder: Secondary | ICD-10-CM | POA: Diagnosis not present

## 2015-10-14 DIAGNOSIS — Z125 Encounter for screening for malignant neoplasm of prostate: Secondary | ICD-10-CM | POA: Diagnosis not present

## 2015-10-14 DIAGNOSIS — M5412 Radiculopathy, cervical region: Secondary | ICD-10-CM | POA: Diagnosis not present

## 2015-10-14 DIAGNOSIS — E6609 Other obesity due to excess calories: Secondary | ICD-10-CM | POA: Diagnosis not present

## 2015-10-14 DIAGNOSIS — R5383 Other fatigue: Secondary | ICD-10-CM | POA: Diagnosis not present

## 2015-10-14 DIAGNOSIS — Z23 Encounter for immunization: Secondary | ICD-10-CM | POA: Diagnosis not present

## 2015-10-14 DIAGNOSIS — E782 Mixed hyperlipidemia: Secondary | ICD-10-CM | POA: Diagnosis not present

## 2015-10-14 DIAGNOSIS — R42 Dizziness and giddiness: Secondary | ICD-10-CM | POA: Diagnosis not present

## 2015-10-14 DIAGNOSIS — I10 Essential (primary) hypertension: Secondary | ICD-10-CM | POA: Diagnosis not present

## 2015-10-14 DIAGNOSIS — Z6835 Body mass index (BMI) 35.0-35.9, adult: Secondary | ICD-10-CM | POA: Diagnosis not present

## 2015-10-14 DIAGNOSIS — E538 Deficiency of other specified B group vitamins: Secondary | ICD-10-CM | POA: Diagnosis not present

## 2015-10-14 DIAGNOSIS — M1991 Primary osteoarthritis, unspecified site: Secondary | ICD-10-CM | POA: Diagnosis not present

## 2015-10-30 DIAGNOSIS — G9589 Other specified diseases of spinal cord: Secondary | ICD-10-CM | POA: Diagnosis not present

## 2015-10-30 DIAGNOSIS — M47812 Spondylosis without myelopathy or radiculopathy, cervical region: Secondary | ICD-10-CM | POA: Diagnosis not present

## 2015-10-30 DIAGNOSIS — M5021 Other cervical disc displacement,  high cervical region: Secondary | ICD-10-CM | POA: Diagnosis not present

## 2015-11-03 DIAGNOSIS — Z6834 Body mass index (BMI) 34.0-34.9, adult: Secondary | ICD-10-CM | POA: Diagnosis not present

## 2015-11-03 DIAGNOSIS — M5000 Cervical disc disorder with myelopathy, unspecified cervical region: Secondary | ICD-10-CM | POA: Diagnosis not present

## 2015-11-03 DIAGNOSIS — M542 Cervicalgia: Secondary | ICD-10-CM | POA: Diagnosis not present

## 2015-11-03 DIAGNOSIS — M4712 Other spondylosis with myelopathy, cervical region: Secondary | ICD-10-CM | POA: Diagnosis not present

## 2015-11-03 DIAGNOSIS — I10 Essential (primary) hypertension: Secondary | ICD-10-CM | POA: Diagnosis not present

## 2015-11-03 DIAGNOSIS — M5030 Other cervical disc degeneration, unspecified cervical region: Secondary | ICD-10-CM | POA: Diagnosis not present

## 2015-11-05 ENCOUNTER — Other Ambulatory Visit: Payer: Self-pay | Admitting: Neurosurgery

## 2015-11-09 ENCOUNTER — Encounter (HOSPITAL_COMMUNITY): Payer: Self-pay

## 2015-11-09 NOTE — Pre-Procedure Instructions (Signed)
ZAHMIR STELLHORN  11/09/2015      [facility] [place], [place] [id number] [place] [id number] Phone: [phone] Fax: [phone]  [facility] [place] [facility] [place] [address] Phone: [phone] Fax: [place], [place] [address] [place]. [name] [address] Phone: [phone] Fax: [phone]    Your procedure is scheduled on Oct. 5  Report to [facility] at 830 A.M.  Call this number if you have problems the morning of surgery:  [phone]   Remember:  Do not eat food or drink liquids after midnight.  Take these medicines the morning of surgery with A SIP OF WATER Albuterol inhaler if needed, amlodipine (Norvasc), Fioricet if needed, citalopram (Celexa), Flonase nasal spray, Metoprolol succinate (Toprol-XL), Nitrostat if needed  Stop taking Mesalamine (Apriso), Aspirin,, BC's, Goody's, Herbal medications, Fish Oil, Ibuprofen, Advil, Motrin, Aleve, Vitamins   Do not wear jewelry, make-up or nail polish.  Do not wear lotions, powders, or perfumes, or deoderant.  Do not shave 48 hours prior to surgery.  Men may shave face and neck.  Do not bring valuables to the hospital.  [facility] is not responsible for any belongings or valuables.  Contacts, dentures or bridgework may not be worn into surgery.  Leave your suitcase in the car.  After surgery it may be brought to your room.  For patients admitted to the hospital, discharge time will be determined by your treatment team.  Patients discharged the day of surgery will not be allowed to drive home.   Special instructions:  [facility] - Preparing for Surgery  Before surgery, you can play an important role.  Because skin is not sterile, your skin needs  to be as free of germs as possible.  You can reduce the number of germs on you skin by washing with CHG (chlorahexidine gluconate) soap before surgery.  CHG is an antiseptic cleaner which kills germs and bonds with the skin to continue killing germs even after washing.  Please DO NOT use if you have an allergy to CHG or antibacterial soaps.  If your skin becomes reddened/irritated stop using the CHG and inform your nurse when you arrive at Short Stay.  Do not shave (including legs and underarms) for at least 48 hours prior to the first CHG shower.  You may shave your face.  Please follow these instructions carefully:   1.  Shower with CHG Soap the night before surgery and the   morning of Surgery.  2.  If you choose to wash your hair, wash your hair first as usual with your   normal shampoo.  3.  After you shampoo, rinse your hair and body thoroughly to remove the Shampoo.  4.  Use CHG as you would any other liquid soap.  You can apply chg directly  to the skin and wash gently with scrungie or a clean washcloth.  5.  Apply the CHG Soap to your body ONLY FROM THE NECK DOWN.   Do not use on open wounds or open sores.  Avoid contact with your eyes,       ears, mouth and genitals (private parts).  Wash genitals (private parts)  with your normal soap.  6.  Wash thoroughly,  paying special attention to the area where your surgery  will be performed.  7.  Thoroughly rinse your body with warm water from the neck down.  8.  DO NOT shower/wash with your normal soap after using and rinsing off   the CHG Soap.  9.  Pat yourself dry with a clean towel.            10.  Wear clean pajamas.            11.  Place clean sheets on your bed the night of your first shower and do not sleep with pets.  Day of Surgery  Do not apply any lotions/deoderants the morning of surgery.  Please wear clean clothes to the hospital/surgery center.     Please read over the following fact sheets that you were given. Coughing  and Deep Breathing, MRSA Information and Surgical Site Infection Prevention

## 2015-11-10 ENCOUNTER — Encounter (HOSPITAL_COMMUNITY): Payer: Self-pay

## 2015-11-10 ENCOUNTER — Encounter (HOSPITAL_COMMUNITY)
Admission: RE | Admit: 2015-11-10 | Discharge: 2015-11-10 | Disposition: A | Discharge status: Home / Self Care | Payer: Medicare Other | Source: Ambulatory Visit | Attending: Neurosurgery | Admitting: Neurosurgery

## 2015-11-10 DIAGNOSIS — I2510 Atherosclerotic heart disease of native coronary artery without angina pectoris: Secondary | ICD-10-CM | POA: Diagnosis not present

## 2015-11-10 DIAGNOSIS — M19049 Primary osteoarthritis, unspecified hand: Secondary | ICD-10-CM | POA: Diagnosis not present

## 2015-11-10 DIAGNOSIS — Z880 Allergy status to penicillin: Secondary | ICD-10-CM | POA: Diagnosis not present

## 2015-11-10 DIAGNOSIS — E785 Hyperlipidemia, unspecified: Secondary | ICD-10-CM | POA: Diagnosis not present

## 2015-11-10 DIAGNOSIS — N529 Male erectile dysfunction, unspecified: Secondary | ICD-10-CM | POA: Diagnosis not present

## 2015-11-10 DIAGNOSIS — I252 Old myocardial infarction: Secondary | ICD-10-CM | POA: Diagnosis not present

## 2015-11-10 DIAGNOSIS — G8250 Quadriplegia, unspecified: Secondary | ICD-10-CM | POA: Diagnosis not present

## 2015-11-10 DIAGNOSIS — Z8249 Family history of ischemic heart disease and other diseases of the circulatory system: Secondary | ICD-10-CM | POA: Diagnosis not present

## 2015-11-10 DIAGNOSIS — M4802 Spinal stenosis, cervical region: Secondary | ICD-10-CM | POA: Diagnosis not present

## 2015-11-10 DIAGNOSIS — K219 Gastro-esophageal reflux disease without esophagitis: Secondary | ICD-10-CM | POA: Diagnosis not present

## 2015-11-10 DIAGNOSIS — Z6834 Body mass index (BMI) 34.0-34.9, adult: Secondary | ICD-10-CM | POA: Diagnosis not present

## 2015-11-10 DIAGNOSIS — K5090 Crohn's disease, unspecified, without complications: Secondary | ICD-10-CM | POA: Diagnosis not present

## 2015-11-10 DIAGNOSIS — I10 Essential (primary) hypertension: Secondary | ICD-10-CM | POA: Diagnosis not present

## 2015-11-10 DIAGNOSIS — Z888 Allergy status to other drugs, medicaments and biological substances status: Secondary | ICD-10-CM | POA: Diagnosis not present

## 2015-11-10 DIAGNOSIS — G4733 Obstructive sleep apnea (adult) (pediatric): Secondary | ICD-10-CM | POA: Diagnosis not present

## 2015-11-10 DIAGNOSIS — Z87891 Personal history of nicotine dependence: Secondary | ICD-10-CM | POA: Diagnosis not present

## 2015-11-10 DIAGNOSIS — M5001 Cervical disc disorder with myelopathy,  high cervical region: Secondary | ICD-10-CM | POA: Diagnosis present

## 2015-11-10 LAB — CBC
HCT: 44.9 % (ref 39.0–52.0)
Hemoglobin: 15 g/dL (ref 13.0–17.0)
MCH: 30.6 pg (ref 26.0–34.0)
MCHC: 33.4 g/dL (ref 30.0–36.0)
MCV: 91.6 fL (ref 78.0–100.0)
Platelets: 380 10*3/uL (ref 150–400)
RBC: 4.9 MIL/uL (ref 4.22–5.81)
RDW: 14.3 % (ref 11.5–15.5)
WBC: 6.5 10*3/uL (ref 4.0–10.5)

## 2015-11-10 LAB — BASIC METABOLIC PANEL
Anion gap: 10 (ref 5–15)
BUN: 8 mg/dL (ref 6–20)
CO2: 26 mmol/L (ref 22–32)
Calcium: 8.9 mg/dL (ref 8.9–10.3)
Chloride: 105 mmol/L (ref 101–111)
Creatinine, Ser: 0.9 mg/dL (ref 0.61–1.24)
GFR calc Af Amer: >60 mL/min (ref >60)
GFR calc non Af Amer: >60 mL/min (ref >60)
Glucose, Bld: 93 mg/dL (ref 65–99)
Potassium: 3.4 mmol/L — ABNORMAL LOW (ref 3.5–5.1)
Sodium: 141 mmol/L (ref 135–145)

## 2015-11-10 LAB — SURGICAL PCR SCREEN
MRSA, PCR: NEGATIVE
Staphylococcus aureus: NEGATIVE

## 2015-11-10 NOTE — Progress Notes (Signed)
Anesthesia Chart Review:  Pt is a 65 year old male scheduled for C3-4, C4-5 ACDF on 11/12/2015 with Jovita Gamma, MD.   - Cardiologist is Shelva Majestic, MD, last office visit 06/27/15 - PCP is Redmond School, MD  PMH includes:  CAD ([place]; last cath 2009 showed CTO RCA with L to R collaterals), HTN, hyperlipidemia, hepatitis (1970's), GERD. Former smoker. BMI 34  Medications include: albuterol, amlodipine, atrovent, metoprolol, ramipril, viagra  Preoperative labs reviewed.    EKG 06/25/15: NSR  Nuclear stress test 02/16/13:  1. No convincing inducible myocardial ischemia. 2. Limited evaluation of the inferior wall due to movement and overlap of the diaphragm and activity within the left lobe of the liver. 3. Calculated ejection fraction of 47%  Echo 09/12/12:  - Left ventricle: The cavity size was normal. Wall thickness was increased in a pattern of mild LVH. There was mild concentric hypertrophy. Systolic function was normal. The estimated ejection fraction was in the range of 55% to 60%. Mild hypokinesis of the inferolateral myocardium. Left ventricular diastolic function parameters were normal. - Mitral valve: Mild regurgitation. - Atrial septum: No defect or patent foramen ovale was identified.  Cardiac cath 12/25/07:  1. Preserved global contractility with mild mid-to-basal inferior and inferoapical hypocontractility.  This patient is status post remote inferior wall myocardial infarction in 09/1992 treated with PTCA of his RCA 2. Minimal 20% narrowing in the 1st diagonal of the LAD, but otherwise, normal LAD system. 3. Large normal CX system. 4. Total occlusion of the mid RCA with extensive left-to-right collaterals from at least 3 resources rising from the LAD and 2 branches of the CX vessel the distal RCA, which is collateralized essentially up to its point of near occlusion in its midsegment. 5. No evidence for renal artery stenosis. 6. Systemic hypertension. - RECOMMENDATIONS:   Medical therapy.  If no changes, I anticipate pt can proceed with surgery as scheduled.   Willeen Cass, FNP-BC [facility] Short Stay Surgical Center/Anesthesiology Phone: [phone] 11/10/2015 1:50 PM

## 2015-11-10 NOTE — Progress Notes (Addendum)
PCP is Dr. Gerarda Fraction Cardiologist is Dr Claiborne Billings Card cath 2009 Echo 2014  States he has had  Multi stress test, but it has been a few years since having one.  Instructed to bring his BiPAP mask on the day of surgery. His wife reports he his heart rate dropped when he had back surgery with Dr Sherwood Gambler in 2004 and again with Lithotripsy. She also states he had had surgery since and has been fine. Sleep study 08-20-15

## 2015-11-11 ENCOUNTER — Telehealth: Payer: Self-pay | Admitting: Internal Medicine

## 2015-11-11 MED ORDER — SODIUM CHLORIDE 0.9 % IV SOLN
1500.0000 mg | Freq: Once | INTRAVENOUS | Status: AC
  Administered 2015-11-12: 1500 mg via INTRAVENOUS
  Filled 2015-11-11: qty 1500

## 2015-11-11 NOTE — Telephone Encounter (Signed)
Pt is on Apriso.  Is it ok for him to stop medication for his surgery?

## 2015-11-11 NOTE — Telephone Encounter (Signed)
Pt's wife, Izora Gala, called to say that the patient is going to have surgery tomorrow and wanted to know if he should stop taking his oprezo or can he continue taking it. Please advise and call either [phone] or [phone]

## 2015-11-11 NOTE — Telephone Encounter (Signed)
pts wife is aware. 

## 2015-11-11 NOTE — Telephone Encounter (Signed)
Should be okay to miss a couple of days. Would resume as soon as he see resumes diet postoperatively.

## 2015-11-12 ENCOUNTER — Inpatient Hospital Stay (HOSPITAL_COMMUNITY): Payer: Medicare Other

## 2015-11-12 ENCOUNTER — Inpatient Hospital Stay (HOSPITAL_COMMUNITY): Payer: Medicare Other | Admitting: Emergency Medicine

## 2015-11-12 ENCOUNTER — Encounter (HOSPITAL_COMMUNITY): Payer: Self-pay | Admitting: *Deleted

## 2015-11-12 ENCOUNTER — Inpatient Hospital Stay (HOSPITAL_COMMUNITY): Payer: Medicare Other | Admitting: Anesthesiology

## 2015-11-12 ENCOUNTER — Encounter (HOSPITAL_COMMUNITY)
Admission: RE | Disposition: A | Discharge status: Home / Self Care | Payer: Self-pay | Source: Ambulatory Visit | Attending: Neurosurgery

## 2015-11-12 ENCOUNTER — Observation Stay (HOSPITAL_COMMUNITY)
Admission: RE | Admit: 2015-11-12 | Discharge: 2015-11-13 | Disposition: A | Discharge status: Home / Self Care | Payer: Medicare Other | Source: Ambulatory Visit | Attending: Neurosurgery | Admitting: Neurosurgery

## 2015-11-12 DIAGNOSIS — Z880 Allergy status to penicillin: Secondary | ICD-10-CM | POA: Insufficient documentation

## 2015-11-12 DIAGNOSIS — Z888 Allergy status to other drugs, medicaments and biological substances status: Secondary | ICD-10-CM | POA: Insufficient documentation

## 2015-11-12 DIAGNOSIS — I252 Old myocardial infarction: Secondary | ICD-10-CM | POA: Diagnosis not present

## 2015-11-12 DIAGNOSIS — G4733 Obstructive sleep apnea (adult) (pediatric): Secondary | ICD-10-CM | POA: Insufficient documentation

## 2015-11-12 DIAGNOSIS — Z6834 Body mass index (BMI) 34.0-34.9, adult: Secondary | ICD-10-CM | POA: Insufficient documentation

## 2015-11-12 DIAGNOSIS — G8250 Quadriplegia, unspecified: Secondary | ICD-10-CM | POA: Insufficient documentation

## 2015-11-12 DIAGNOSIS — E785 Hyperlipidemia, unspecified: Secondary | ICD-10-CM | POA: Diagnosis not present

## 2015-11-12 DIAGNOSIS — N529 Male erectile dysfunction, unspecified: Secondary | ICD-10-CM | POA: Insufficient documentation

## 2015-11-12 DIAGNOSIS — K219 Gastro-esophageal reflux disease without esophagitis: Secondary | ICD-10-CM | POA: Diagnosis not present

## 2015-11-12 DIAGNOSIS — I10 Essential (primary) hypertension: Secondary | ICD-10-CM | POA: Insufficient documentation

## 2015-11-12 DIAGNOSIS — M5000 Cervical disc disorder with myelopathy, unspecified cervical region: Secondary | ICD-10-CM | POA: Diagnosis not present

## 2015-11-12 DIAGNOSIS — I2510 Atherosclerotic heart disease of native coronary artery without angina pectoris: Secondary | ICD-10-CM | POA: Insufficient documentation

## 2015-11-12 DIAGNOSIS — Z419 Encounter for procedure for purposes other than remedying health state, unspecified: Secondary | ICD-10-CM

## 2015-11-12 DIAGNOSIS — Z87891 Personal history of nicotine dependence: Secondary | ICD-10-CM | POA: Insufficient documentation

## 2015-11-12 DIAGNOSIS — M5001 Cervical disc disorder with myelopathy,  high cervical region: Secondary | ICD-10-CM | POA: Diagnosis not present

## 2015-11-12 DIAGNOSIS — M19049 Primary osteoarthritis, unspecified hand: Secondary | ICD-10-CM | POA: Diagnosis not present

## 2015-11-12 DIAGNOSIS — M4322 Fusion of spine, cervical region: Secondary | ICD-10-CM | POA: Diagnosis not present

## 2015-11-12 DIAGNOSIS — M5030 Other cervical disc degeneration, unspecified cervical region: Secondary | ICD-10-CM | POA: Diagnosis not present

## 2015-11-12 DIAGNOSIS — M4802 Spinal stenosis, cervical region: Secondary | ICD-10-CM | POA: Insufficient documentation

## 2015-11-12 DIAGNOSIS — Z8249 Family history of ischemic heart disease and other diseases of the circulatory system: Secondary | ICD-10-CM | POA: Insufficient documentation

## 2015-11-12 DIAGNOSIS — Z836 Family history of other diseases of the respiratory system: Secondary | ICD-10-CM | POA: Insufficient documentation

## 2015-11-12 DIAGNOSIS — K5090 Crohn's disease, unspecified, without complications: Secondary | ICD-10-CM | POA: Insufficient documentation

## 2015-11-12 DIAGNOSIS — Z8261 Family history of arthritis: Secondary | ICD-10-CM | POA: Insufficient documentation

## 2015-11-12 DIAGNOSIS — M4712 Other spondylosis with myelopathy, cervical region: Secondary | ICD-10-CM | POA: Diagnosis not present

## 2015-11-12 HISTORY — PX: ANTERIOR CERVICAL DECOMP/DISCECTOMY FUSION: SHX1161

## 2015-11-12 SURGERY — ANTERIOR CERVICAL DECOMPRESSION/DISCECTOMY FUSION 2 LEVELS
Anesthesia: General

## 2015-11-12 MED ORDER — CYCLOBENZAPRINE HCL 10 MG PO TABS: 10.0000 mg | ORAL_TABLET | Freq: Three times a day (TID) | ORAL | Status: DC | PRN

## 2015-11-12 MED ORDER — LIDOCAINE 2% (20 MG/ML) 5 ML SYRINGE
INTRAMUSCULAR | Status: AC
  Filled 2015-11-12: qty 5

## 2015-11-12 MED ORDER — 0.9 % SODIUM CHLORIDE (POUR BTL) OPTIME
TOPICAL | Status: DC | PRN
  Administered 2015-11-12: 1000 mL

## 2015-11-12 MED ORDER — DEXTROSE 5 % IV SOLN
INTRAVENOUS | Status: DC | PRN
  Administered 2015-11-12: 25 ug/min via INTRAVENOUS

## 2015-11-12 MED ORDER — LIDOCAINE HCL (CARDIAC) 20 MG/ML IV SOLN
INTRAVENOUS | Status: DC | PRN
  Administered 2015-11-12: 50 mg via INTRAVENOUS

## 2015-11-12 MED ORDER — KETOROLAC TROMETHAMINE 30 MG/ML IJ SOLN: 15.0000 mg | Freq: Once | INTRAMUSCULAR | Status: DC

## 2015-11-12 MED ORDER — ROCURONIUM BROMIDE 100 MG/10ML IV SOLN
INTRAVENOUS | Status: DC | PRN
  Administered 2015-11-12: 50 mg via INTRAVENOUS
  Administered 2015-11-12 (×2): 10 mg via INTRAVENOUS

## 2015-11-12 MED ORDER — FENTANYL CITRATE (PF) 100 MCG/2ML IJ SOLN
INTRAMUSCULAR | Status: AC
  Filled 2015-11-12: qty 4

## 2015-11-12 MED ORDER — PHENYLEPHRINE HCL 10 MG/ML IJ SOLN
INTRAMUSCULAR | Status: AC
  Filled 2015-11-12: qty 1

## 2015-11-12 MED ORDER — CHLORHEXIDINE GLUCONATE CLOTH 2 % EX PADS
6.0000 | MEDICATED_PAD | Freq: Once | CUTANEOUS | Status: DC
Start: 2015-11-12 — End: 2015-11-12

## 2015-11-12 MED ORDER — SODIUM CHLORIDE 0.9% FLUSH: 3.0000 mL | INTRAVENOUS | Status: DC | PRN

## 2015-11-12 MED ORDER — MIDAZOLAM HCL 2 MG/2ML IJ SOLN
INTRAMUSCULAR | Status: AC
  Filled 2015-11-12: qty 2

## 2015-11-12 MED ORDER — DEXAMETHASONE SODIUM PHOSPHATE 10 MG/ML IJ SOLN
INTRAMUSCULAR | Status: DC | PRN
  Administered 2015-11-12: 10 mg via INTRAVENOUS

## 2015-11-12 MED ORDER — LIDOCAINE-EPINEPHRINE 1 %-1:100000 IJ SOLN
INTRAMUSCULAR | Status: AC
  Filled 2015-11-12: qty 1

## 2015-11-12 MED ORDER — IPRATROPIUM BROMIDE 0.06 % NA SOLN
1.0000 | Freq: Every day | NASAL | Status: DC
  Filled 2015-11-12: qty 15

## 2015-11-12 MED ORDER — ONDANSETRON HCL 4 MG PO TABS: 4.0000 mg | ORAL_TABLET | Freq: Four times a day (QID) | ORAL | Status: DC | PRN

## 2015-11-12 MED ORDER — ONDANSETRON HCL 4 MG/2ML IJ SOLN
INTRAMUSCULAR | Status: AC
  Filled 2015-11-12: qty 2

## 2015-11-12 MED ORDER — KETOROLAC TROMETHAMINE 30 MG/ML IJ SOLN
15.0000 mg | Freq: Four times a day (QID) | INTRAMUSCULAR | Status: DC
  Administered 2015-11-12 – 2015-11-13 (×3): 15 mg via INTRAVENOUS
  Filled 2015-11-12 (×2): qty 1

## 2015-11-12 MED ORDER — SUGAMMADEX SODIUM 500 MG/5ML IV SOLN
INTRAVENOUS | Status: AC
  Filled 2015-11-12: qty 5

## 2015-11-12 MED ORDER — ALPRAZOLAM 0.5 MG PO TABS: 1.0000 mg | ORAL_TABLET | Freq: Every evening | ORAL | Status: DC | PRN

## 2015-11-12 MED ORDER — THROMBIN 20000 UNITS EX SOLR
CUTANEOUS | Status: DC | PRN
  Administered 2015-11-12: 14:00:00 via TOPICAL

## 2015-11-12 MED ORDER — KETOROLAC TROMETHAMINE 15 MG/ML IJ SOLN
INTRAMUSCULAR | Status: AC
  Administered 2015-11-12: 15 mg
  Filled 2015-11-12: qty 1

## 2015-11-12 MED ORDER — ROCURONIUM BROMIDE 10 MG/ML (PF) SYRINGE
PREFILLED_SYRINGE | INTRAVENOUS | Status: AC
  Filled 2015-11-12: qty 10

## 2015-11-12 MED ORDER — KCL IN DEXTROSE-NACL 20-5-0.45 MEQ/L-%-% IV SOLN: INTRAVENOUS | Status: DC

## 2015-11-12 MED ORDER — OXYCODONE-ACETAMINOPHEN 5-325 MG PO TABS
ORAL_TABLET | ORAL | Status: AC
  Filled 2015-11-12: qty 2

## 2015-11-12 MED ORDER — ONDANSETRON HCL 4 MG/2ML IJ SOLN
INTRAMUSCULAR | Status: DC | PRN
  Administered 2015-11-12: 4 mg via INTRAVENOUS

## 2015-11-12 MED ORDER — GLYCOPYRROLATE 0.2 MG/ML IJ SOLN
INTRAMUSCULAR | Status: DC | PRN
  Administered 2015-11-12: 0.4 mg via INTRAVENOUS

## 2015-11-12 MED ORDER — EPHEDRINE 5 MG/ML INJ
INTRAVENOUS | Status: AC
  Filled 2015-11-12: qty 10

## 2015-11-12 MED ORDER — PHENYLEPHRINE HCL 10 MG/ML IJ SOLN
INTRAMUSCULAR | Status: DC | PRN
  Administered 2015-11-12: 80 ug via INTRAVENOUS

## 2015-11-12 MED ORDER — ALUM & MAG HYDROXIDE-SIMETH 200-200-20 MG/5ML PO SUSP: 30.0000 mL | Freq: Four times a day (QID) | ORAL | Status: DC | PRN

## 2015-11-12 MED ORDER — MORPHINE SULFATE (PF) 4 MG/ML IV SOLN: 4.0000 mg | INTRAVENOUS | Status: DC | PRN

## 2015-11-12 MED ORDER — PHENOL 1.4 % MT LIQD
1.0000 | OROMUCOSAL | Status: DC | PRN
Start: 2015-11-12 — End: 2015-11-13

## 2015-11-12 MED ORDER — MENTHOL 3 MG MT LOZG: 1.0000 | LOZENGE | OROMUCOSAL | Status: DC | PRN

## 2015-11-12 MED ORDER — MAGNESIUM HYDROXIDE 400 MG/5ML PO SUSP: 30.0000 mL | Freq: Every day | ORAL | Status: DC | PRN

## 2015-11-12 MED ORDER — LACTATED RINGERS IV SOLN
INTRAVENOUS | Status: DC
  Administered 2015-11-12: 09:00:00 via INTRAVENOUS

## 2015-11-12 MED ORDER — ZOLPIDEM TARTRATE 5 MG PO TABS: 5.0000 mg | ORAL_TABLET | Freq: Every evening | ORAL | Status: DC | PRN

## 2015-11-12 MED ORDER — LACTATED RINGERS IV SOLN
INTRAVENOUS | Status: DC | PRN
  Administered 2015-11-12 (×2): via INTRAVENOUS

## 2015-11-12 MED ORDER — CITALOPRAM HYDROBROMIDE 10 MG PO TABS
10.0000 mg | ORAL_TABLET | Freq: Every day | ORAL | Status: DC
  Filled 2015-11-12: qty 1

## 2015-11-12 MED ORDER — IPRATROPIUM BROMIDE 0.03 % NA SOLN
2.0000 | Freq: Every day | NASAL | Status: DC
  Filled 2015-11-12: qty 30

## 2015-11-12 MED ORDER — ONDANSETRON HCL 4 MG/2ML IJ SOLN: 4.0000 mg | Freq: Four times a day (QID) | INTRAMUSCULAR | Status: DC | PRN

## 2015-11-12 MED ORDER — MESALAMINE ER 250 MG PO CPCR
1500.0000 mg | ORAL_CAPSULE | Freq: Every day | ORAL | Status: DC
  Filled 2015-11-12 (×2): qty 6

## 2015-11-12 MED ORDER — LIDOCAINE-EPINEPHRINE 1 %-1:100000 IJ SOLN
INTRAMUSCULAR | Status: DC | PRN
  Administered 2015-11-12: 10 mL

## 2015-11-12 MED ORDER — PROPOFOL 10 MG/ML IV BOLUS
INTRAVENOUS | Status: AC
  Filled 2015-11-12: qty 40

## 2015-11-12 MED ORDER — FLUTICASONE PROPIONATE 50 MCG/ACT NA SUSP
1.0000 | Freq: Every day | NASAL | Status: DC
  Filled 2015-11-12: qty 16

## 2015-11-12 MED ORDER — PROPOFOL 10 MG/ML IV BOLUS
INTRAVENOUS | Status: DC | PRN
  Administered 2015-11-12: 100 mg via INTRAVENOUS

## 2015-11-12 MED ORDER — CHLORHEXIDINE GLUCONATE CLOTH 2 % EX PADS: 6.0000 | MEDICATED_PAD | Freq: Once | CUTANEOUS | Status: DC

## 2015-11-12 MED ORDER — HYDROXYZINE HCL 50 MG/ML IM SOLN: 50.0000 mg | INTRAMUSCULAR | Status: DC | PRN

## 2015-11-12 MED ORDER — THROMBIN 20000 UNITS EX SOLR
CUTANEOUS | Status: AC
  Filled 2015-11-12: qty 20000

## 2015-11-12 MED ORDER — BUPIVACAINE HCL (PF) 0.5 % IJ SOLN
INTRAMUSCULAR | Status: DC | PRN
  Administered 2015-11-12: 10 mL

## 2015-11-12 MED ORDER — SODIUM CHLORIDE 0.9 % IV SOLN: 250.0000 mL | INTRAVENOUS | Status: DC

## 2015-11-12 MED ORDER — RAMIPRIL 10 MG PO CAPS
10.0000 mg | ORAL_CAPSULE | Freq: Every day | ORAL | Status: DC
  Filled 2015-11-12 (×2): qty 1

## 2015-11-12 MED ORDER — BISACODYL 10 MG RE SUPP: 10.0000 mg | Freq: Every day | RECTAL | Status: DC | PRN

## 2015-11-12 MED ORDER — PHENYLEPHRINE 40 MCG/ML (10ML) SYRINGE FOR IV PUSH (FOR BLOOD PRESSURE SUPPORT)
PREFILLED_SYRINGE | INTRAVENOUS | Status: AC
  Filled 2015-11-12: qty 10

## 2015-11-12 MED ORDER — ACETAMINOPHEN 325 MG PO TABS: 650.0000 mg | ORAL_TABLET | ORAL | Status: DC | PRN

## 2015-11-12 MED ORDER — HYDROCODONE-ACETAMINOPHEN 5-325 MG PO TABS: 1.0000 | ORAL_TABLET | ORAL | Status: DC | PRN

## 2015-11-12 MED ORDER — SODIUM CHLORIDE 0.9% FLUSH
3.0000 mL | Freq: Two times a day (BID) | INTRAVENOUS | Status: DC
  Administered 2015-11-12: 3 mL via INTRAVENOUS

## 2015-11-12 MED ORDER — FENTANYL CITRATE (PF) 100 MCG/2ML IJ SOLN
INTRAMUSCULAR | Status: DC | PRN
  Administered 2015-11-12: 200 ug via INTRAVENOUS

## 2015-11-12 MED ORDER — HYDROMORPHONE HCL 1 MG/ML IJ SOLN
0.2500 mg | INTRAMUSCULAR | Status: DC | PRN
  Administered 2015-11-12 (×2): 0.5 mg via INTRAVENOUS

## 2015-11-12 MED ORDER — AMLODIPINE BESYLATE 5 MG PO TABS
5.0000 mg | ORAL_TABLET | Freq: Every day | ORAL | Status: DC
Start: 2015-11-13 — End: 2015-11-13
  Filled 2015-11-12: qty 1

## 2015-11-12 MED ORDER — ACETAMINOPHEN 650 MG RE SUPP: 650.0000 mg | RECTAL | Status: DC | PRN

## 2015-11-12 MED ORDER — HYDROMORPHONE HCL 1 MG/ML IJ SOLN
INTRAMUSCULAR | Status: AC
  Administered 2015-11-12: 0.5 mg via INTRAVENOUS
  Filled 2015-11-12: qty 1

## 2015-11-12 MED ORDER — PROMETHAZINE HCL 25 MG/ML IJ SOLN: 6.2500 mg | INTRAMUSCULAR | Status: DC | PRN

## 2015-11-12 MED ORDER — OXYCODONE-ACETAMINOPHEN 5-325 MG PO TABS
1.0000 | ORAL_TABLET | ORAL | Status: DC | PRN
  Administered 2015-11-12: 2 via ORAL

## 2015-11-12 MED ORDER — METOPROLOL SUCCINATE ER 50 MG PO TB24
50.0000 mg | ORAL_TABLET | Freq: Every day | ORAL | Status: DC
  Filled 2015-11-12: qty 1

## 2015-11-12 MED ORDER — THROMBIN 5000 UNITS EX SOLR
CUTANEOUS | Status: AC
  Filled 2015-11-12: qty 5000

## 2015-11-12 MED ORDER — DIPHENHYDRAMINE HCL 50 MG/ML IJ SOLN
INTRAMUSCULAR | Status: AC
  Filled 2015-11-12: qty 1

## 2015-11-12 MED ORDER — SODIUM CHLORIDE 0.9 % IR SOLN
Status: DC | PRN
  Administered 2015-11-12: 14:00:00

## 2015-11-12 MED ORDER — NITROGLYCERIN 0.4 MG SL SUBL: 0.4000 mg | SUBLINGUAL_TABLET | SUBLINGUAL | Status: DC | PRN

## 2015-11-12 MED ORDER — GENTAMICIN IN SALINE 1.6-0.9 MG/ML-% IV SOLN
80.0000 mg | INTRAVENOUS | Status: AC
  Administered 2015-11-12: 80 mg via INTRAVENOUS
  Filled 2015-11-12: qty 50

## 2015-11-12 MED ORDER — MEPERIDINE HCL 25 MG/ML IJ SOLN: 6.2500 mg | INTRAMUSCULAR | Status: DC | PRN

## 2015-11-12 MED ORDER — SUGAMMADEX SODIUM 500 MG/5ML IV SOLN
INTRAVENOUS | Status: DC | PRN
  Administered 2015-11-12: 210.4 mg via INTRAVENOUS

## 2015-11-12 MED ORDER — THROMBIN 5000 UNITS EX SOLR
OROMUCOSAL | Status: DC | PRN
  Administered 2015-11-12: 14:00:00 via TOPICAL

## 2015-11-12 MED ORDER — GLYCOPYRROLATE 0.2 MG/ML IV SOSY
PREFILLED_SYRINGE | INTRAVENOUS | Status: AC
  Filled 2015-11-12: qty 3

## 2015-11-12 MED ORDER — BUPIVACAINE HCL (PF) 0.5 % IJ SOLN
INTRAMUSCULAR | Status: AC
  Filled 2015-11-12: qty 30

## 2015-11-12 MED ORDER — MIDAZOLAM HCL 2 MG/2ML IJ SOLN
0.5000 mg | Freq: Once | INTRAMUSCULAR | Status: AC | PRN
  Administered 2015-11-12: 0.5 mg via INTRAVENOUS

## 2015-11-12 MED ORDER — HYDROXYZINE HCL 50 MG PO TABS
50.0000 mg | ORAL_TABLET | ORAL | Status: DC | PRN
  Filled 2015-11-12: qty 1

## 2015-11-12 MED ORDER — ACETAMINOPHEN 10 MG/ML IV SOLN
1000.0000 mg | Freq: Four times a day (QID) | INTRAVENOUS | Status: DC
Start: 2015-11-12 — End: 2015-11-12
  Administered 2015-11-12: 1000 mg via INTRAVENOUS
  Filled 2015-11-12: qty 100

## 2015-11-12 SURGICAL SUPPLY — 55 items
ADH SKN CLS APL DERMABOND .7 (GAUZE/BANDAGES/DRESSINGS) ×1
BAG DECANTER FOR FLEXI CONT (MISCELLANEOUS) ×2 IMPLANT
BIT DRILL 14X2.5XNS TI ANT (BIT) IMPLANT
BIT DRILL AVIATOR 14 (BIT) ×2
BIT DRILL NEURO 2X3.1 SFT TUCH (MISCELLANEOUS) ×1 IMPLANT
BIT DRL 14X2.5XNS TI ANT (BIT) ×1
BLADE ULTRA TIP 2M (BLADE) ×2 IMPLANT
CANISTER SUCT 3000ML PPV (MISCELLANEOUS) ×2 IMPLANT
COVER MAYO STAND STRL (DRAPES) ×2 IMPLANT
DECANTER SPIKE VIAL GLASS SM (MISCELLANEOUS) ×2 IMPLANT
DERMABOND ADVANCED (GAUZE/BANDAGES/DRESSINGS) ×1
DERMABOND ADVANCED .7 DNX12 (GAUZE/BANDAGES/DRESSINGS) ×1 IMPLANT
DRAPE HALF SHEET 40X57 (DRAPES) IMPLANT
DRAPE LAPAROTOMY 100X72 PEDS (DRAPES) ×2 IMPLANT
DRAPE MICROSCOPE LEICA (MISCELLANEOUS) ×2 IMPLANT
DRAPE POUCH INSTRU U-SHP 10X18 (DRAPES) ×2 IMPLANT
DRILL NEURO 2X3.1 SOFT TOUCH (MISCELLANEOUS) ×2
ELECT COATED BLADE 2.86 ST (ELECTRODE) ×2 IMPLANT
ELECT REM PT RETURN 9FT ADLT (ELECTROSURGICAL) ×2
ELECTRODE REM PT RTRN 9FT ADLT (ELECTROSURGICAL) ×1 IMPLANT
GLOVE BIOGEL PI IND STRL 8 (GLOVE) ×1 IMPLANT
GLOVE BIOGEL PI INDICATOR 8 (GLOVE) ×1
GLOVE ECLIPSE 7.5 STRL STRAW (GLOVE) ×2 IMPLANT
GLOVE EXAM NITRILE LRG STRL (GLOVE) IMPLANT
GLOVE EXAM NITRILE XL STR (GLOVE) IMPLANT
GLOVE EXAM NITRILE XS STR PU (GLOVE) IMPLANT
GOWN STRL REUS W/ TWL LRG LVL3 (GOWN DISPOSABLE) IMPLANT
GOWN STRL REUS W/ TWL XL LVL3 (GOWN DISPOSABLE) IMPLANT
GOWN STRL REUS W/TWL 2XL LVL3 (GOWN DISPOSABLE) IMPLANT
GOWN STRL REUS W/TWL LRG LVL3 (GOWN DISPOSABLE)
GOWN STRL REUS W/TWL XL LVL3 (GOWN DISPOSABLE)
GRAFT CORT CANC 14X8.25X11 5D (Bone Implant) ×2 IMPLANT
HALTER HD/CHIN CERV TRACTION D (MISCELLANEOUS) ×2 IMPLANT
HEMOSTAT POWDER KIT SURGIFOAM (HEMOSTASIS) ×2 IMPLANT
KIT BASIN OR (CUSTOM PROCEDURE TRAY) ×2 IMPLANT
KIT ROOM TURNOVER OR (KITS) ×2 IMPLANT
NDL HYPO 25X1 1.5 SAFETY (NEEDLE) ×1 IMPLANT
NDL SPNL 22GX3.5 QUINCKE BK (NEEDLE) ×2 IMPLANT
NEEDLE HYPO 25X1 1.5 SAFETY (NEEDLE) ×2 IMPLANT
NEEDLE SPNL 22GX3.5 QUINCKE BK (NEEDLE) ×4 IMPLANT
NS IRRIG 1000ML POUR BTL (IV SOLUTION) ×2 IMPLANT
PACK LAMINECTOMY NEURO (CUSTOM PROCEDURE TRAY) ×2 IMPLANT
PAD ARMBOARD 7.5X6 YLW CONV (MISCELLANEOUS) ×6 IMPLANT
PLATE AVIATOR ASSY 2LVL SZ 32 (Plate) IMPLANT
PLATE AVIATOR ASSY 2LVL SZ 37 (Plate) ×1 IMPLANT
RUBBERBAND STERILE (MISCELLANEOUS) ×4 IMPLANT
SCREW AVIATOR VAR SELFTAP 4X14 (Screw) ×6 IMPLANT
SPONGE INTESTINAL PEANUT (DISPOSABLE) ×2 IMPLANT
SPONGE SURGIFOAM ABS GEL 100 (HEMOSTASIS) ×2 IMPLANT
STAPLER SKIN PROX WIDE 3.9 (STAPLE) IMPLANT
SUT VIC AB 2-0 CP2 18 (SUTURE) ×2 IMPLANT
SUT VIC AB 3-0 SH 8-18 (SUTURE) ×3 IMPLANT
TOWEL OR 17X24 6PK STRL BLUE (TOWEL DISPOSABLE) ×2 IMPLANT
TOWEL OR 17X26 10 PK STRL BLUE (TOWEL DISPOSABLE) ×2 IMPLANT
WATER STERILE IRR 1000ML POUR (IV SOLUTION) ×2 IMPLANT

## 2015-11-12 NOTE — Anesthesia Postprocedure Evaluation (Signed)
Anesthesia Post Note  Patient: [name]  Procedure(s) Performed: Procedure(s) (LRB): ANTERIOR CERVICAL DECOMPRESSION/DISCECTOMY FUSION CERVICAL THREE- CERVICAL FOUR, CERVICAL FOUR- CERVICAL FIVE (N/A)  Patient location during evaluation: PACU Anesthesia Type: General Level of consciousness: sedated, oriented and patient cooperative Pain management: pain level controlled Vital Signs Assessment: post-procedure vital signs reviewed and stable Respiratory status: spontaneous breathing, nonlabored ventilation, respiratory function stable and patient connected to nasal cannula oxygen Cardiovascular status: blood pressure returned to baseline and stable Postop Assessment: no signs of nausea or vomiting Anesthetic complications: no    Last Vitals:  Vitals:   11/12/15 1800 11/12/15 1833  BP: 114/72 128/78  Pulse: 75 82  Resp: 14 18  Temp: 36.3 C 36.8 C    Last Pain:  Vitals:   11/12/15 1800  TempSrc:   PainSc: Asleep                 Cyndee Giammarco,E. Khup Sapia

## 2015-11-12 NOTE — Progress Notes (Signed)
Vitals:   11/12/15 1730 11/12/15 1745 11/12/15 1800 11/12/15 1833  BP: 124/66 117/80 114/72 128/78  Pulse: 69 71 75 82  Resp: 15 (!) 9 14 18   Temp: (!) 96.8 F (36 C)  97.3 F (36.3 C) 98.2 F (36.8 C)  TempSrc:      SpO2: 93% 94% 95% 93%  Weight:      Height:        CBC  Recent Labs  11/10/15 0909  WBC 6.5  HGB 15.0  HCT 44.9  PLT 380   BMET  Recent Labs  11/10/15 0909  NA 141  K 3.4*  CL 105  CO2 26  GLUCOSE 93  BUN 8  CREATININE 0.90  CALCIUM 8.9    Patient resting in bed. Has not yet ambulated in the halls. Wound clean and dry, no underlying swelling nor erythema or drainage. No void yet. Moving all extremities.  Plan: Encouraged to ambulate. Will continue to progress through postoperative recovery.  Hosie Spangle, MD 11/12/2015, 7:03 PM

## 2015-11-12 NOTE — H&P (Signed)
Subjective: Patient is a 65 y.o. right handed white male who is admitted for treatment of severe cervical stenosis and spinal cord compression with resulting cervical myelopathy and quadriparesis. MRI scan reveals severe cervical stenosis and spinal cord compression at the C3-4 and C4-5 levels due to broad-based spondylitic disc herniations at C3-4 and C4-5, in conjunction with significant ligamentum flavum thickening. Increased signal in the spinal cord is seen at both the C3-4 and C4-5 levels. The risks spondylitic degeneration and spinal disc condition as well at the C5-6 and C6-7 levels, but not significant spinal cord compression. Patient admitted now for a 2 level C3-4 and C4-5 anterior cervical decompression and arthrodesis.   Patient Active Problem List   Diagnosis Date Noted  . Crohn disease ([place]) 01/20/2015  . Rectal fistula 01/20/2015  . Noninfectious gastroenteritis, unspecified   . Rectal bleeding 10/21/2014  . Bowel habit changes 10/21/2014  . Obesity (BMI 30.0-34.9) 05/18/2014  . OSA (obstructive sleep apnea) 03/30/2013  . Chest pain 02/15/2013  . HTN (hypertension) 02/15/2013  . CAD (coronary artery disease) 02/15/2013  . Dyslipidemia 02/15/2013  . HAND, ARTHRITIS, DEGEN./OSTEO 12/01/2008  . TRIGGER FINGER 12/01/2008   Past Medical History:  Diagnosis Date  . Anginal pain ([place])   . Anxiety   . Arthritis   . CAD (coronary artery disease)    a. Mid RCA CTO w/ L-R collateralization, nonobstructive dz elsewhere by 2009 cath  . Complication of anesthesia    heart rate dropped twice during surg  . Depression   . GERD (gastroesophageal reflux disease)   . Headache   . Hepatitis    in the early 70's not sure what kind  . History of tobacco abuse   . Hyperlipidemia    a. statin, niacin intolerant  . Hypertension   . Myocardial infarct    a. MI in 1994 s/p PTCA alone  . Nephrolithiasis   . Obesity   . Sleep apnea    does not tol BIPAP or CPAP    Past Surgical  History:  Procedure Laterality Date  . ANAL FISTULOTOMY N/A 06/28/2013   Procedure: ANAL FISTULOTOMY;  Surgeon: [name], MD;  Location: [facility];  Service: General;  Laterality: N/A;  . BACK SURGERY    . BIOPSY THYROID    . [place], 2009   a. PTCA alone in 1994 b. RCA CTO w/ L-R collaterals, 40-50% prox RCA, 20% prox LAD; EF 50-55%, inferoapical HK  . CARDIAC CATHETERIZATION  04/02/2001   Continue medical therapy.  Marland Kitchen CARDIAC CATHETERIZATION  11/07/2000   Continue medical therapy.  Marland Kitchen CARDIAC CATHETERIZATION  09/24/1996   Continue medical therapy  . CARDIOVASCULAR STRESS TEST  04/06/2010   No scintigraphic evidence of inducible myocardial ischemia. No ECG changes. EKG negative for ischemia.  Marland Kitchen CARPAL TUNNEL RELEASE Left 10/09/2014   Procedure: LEFT CARPAL TUNNEL RELEASE;  Surgeon: [name], MD;  Location: [facility];  Service: Orthopedics;  Laterality: Left;  . COLONOSCOPY N/A 11/06/2014   RMR: Skipped Colonic ulcerations with significant ileocecal valve involvement and rectal sparing most consisitant with Crohns disease. Minimal non-steroidal drug use. Probable colonic lipoma  . CYSTECTOMY    . INCISION AND DRAINAGE ABSCESS Right 05/23/2012   Procedure: INCISION AND DRAINAGE ABSCESS;  Surgeon: [name], MD;  Location: [facility];  Service: General;  Laterality: Right;  . KIDNEY STONE SURGERY  2004  . KNEE SURGERY    . LOWER EXTREMITY ARTERIAL DOPPLER  08/02/2007   Bilateral ABIs-no  evidence of arterial insufficiency. Bilateral PVRs demonstrate normal waveforms.  Marland Kitchen NOSE SURGERY    . PILONIDAL CYST EXCISION    . SPLENECTOMY    . TRANSTHORACIC ECHOCARDIOGRAM  03/05/2009   EF >55%, normal LV wall thickness.  . WRIST SURGERY     carpal tunnel    Prescriptions Prior to Admission  Medication Sig Dispense Refill Last Dose  . ALPRAZolam (XANAX) 0.5 MG tablet Take 1 mg by mouth at bedtime as needed for sleep.    11/11/2015 at Unknown time  . amLODipine  (NORVASC) 5 MG tablet Take 1 tablet (5 mg total) by mouth daily. 180 tablet 3 11/12/2015 at 0645  . citalopram (CELEXA) 10 MG tablet Take 10 mg by mouth daily.   11/12/2015 at [place]  . fluticasone (FLONASE) 50 MCG/ACT nasal spray Place 1 spray into both nostrils daily.   11/12/2015 at [facility]  . ipratropium (ATROVENT) 0.03 % nasal spray Place 2 sprays into both nostrils at bedtime.   5 11/11/2015 at Unknown time  . mesalamine (APRISO) 0.375 g 24 hr capsule Take 4 capsules (1.5 g total) by mouth daily. 120 capsule 11 11/11/2015 at Unknown time  . metoprolol succinate (TOPROL-XL) 50 MG 24 hr tablet Take 50 mg by mouth daily. Take with or immediately following a meal.   11/12/2015 at 0645  . NON FORMULARY Bipap machine     . ramipril (ALTACE) 10 MG capsule TAKE 1 CAPSULE DAILY (Patient taking differently: TAKE 1 CAPSULE (10 mg) DAILY) 90 capsule 2 11/11/2015 at Unknown time  . VIAGRA 100 MG tablet Take 100 mg by mouth as needed for erectile dysfunction.    Taking  . albuterol (PROVENTIL HFA;VENTOLIN HFA) 108 (90 BASE) MCG/ACT inhaler Inhale 1 puff into the lungs every 6 (six) hours as needed for wheezing or shortness of breath. Reported on 07/16/2015   More than a month at Unknown time  . butalbital-acetaminophen-caffeine (FIORICET, ESGIC) 50-325-40 MG tablet Take 1 tablet by mouth 2 (two) times daily as needed for headache.   More than a month at Unknown time  . nitroGLYCERIN (NITROSTAT) 0.4 MG SL tablet Place 0.4 mg under the tongue every 5 (five) minutes as needed for chest pain. Reported on 07/16/2015   More than a month at Unknown time   Allergies  Allergen Reactions  . Niacin And Related Other (See Comments)    flush  . Penicillins Hives and Itching    Has patient had a PCN reaction causing immediate rash, facial/tongue/throat swelling, SOB or lightheadedness with hypotension: Yes Has patient had a PCN reaction causing severe rash involving mucus membranes or skin necrosis: No Has patient had a PCN reaction  that required hospitalization No Has patient had a PCN reaction occurring within the last 10 years: No If all of the above answers are "NO", then may proceed with Cephalosporin use.   . Statins     myalgias    Social History  Substance Use Topics  . Smoking status: Former Smoker    Packs/day: 2.00    Years: 28.00    Types: Cigarettes    Quit date: 03/28/1990  . Smokeless tobacco: Former Systems developer    Types: Chew  . Alcohol use No    Family History  Problem Relation Age of Onset  . CAD Father     CABG   . CAD Brother     CABG in 101s  . CAD Brother   . Arthritis/Rheumatoid Mother   . Hyperlipidemia Mother   . Thrombocytopenia Mother   .  COPD Son     smoker  . Alzheimer's disease Maternal Grandmother   . Lung disease Paternal Grandfather      Review of Systems A comprehensive review of systems was negative.  Objective: Vital signs in last 24 hours: Temp:  [98 F (36.7 C)] 98 F (36.7 C) (10/05 0832) Pulse Rate:  [62] 62 (10/05 0832) Resp:  [20] 20 (10/05 0832) BP: (133)/(87) 133/87 (10/05 0832) SpO2:  [96 %] 96 % (10/05 0832) Weight:  [105.2 kg (232 lb)] 105.2 kg (232 lb) (10/05 0832)  EXAM: Patient well-developed well-nourished white male in no acute distress. Lungs are clear to auscultation , the patient has symmetrical respiratory excursion. Heart has a regular rate and rhythm normal S1 and S2 no murmur.   Abdomen is soft nontender nondistended bowel sounds are present. Extremity examination shows no clubbing cyanosis or edema. Motor examination shows evidence of quadriparesis. The left deltoid is 4, the right deltoid is 4+. The biceps are 3-4 minus bilaterally. Triceps are 4+ bilaterally. Left intrinsics and grip are 3-4 minus. Right intrinsics grip are 4 minus. The left iliopsoas is 4. The right iliopsoas is 4+. The quadriceps are 5 bilaterally. Sensation is intact to pinprick through the distal upper extremities. Reflexes are absent at the biceps and brachialis body, 2-3  in the triceps bilaterally. Biceps are trace bilaterally. Gastrocnemius is minimal bilateral. Toes are downgoing bilaterally. He has no Hoffmann's. Gait and stance are well.   Data Review:CBC    Component Value Date/Time   WBC 6.5 11/10/2015 0909   RBC 4.90 11/10/2015 0909   HGB 15.0 11/10/2015 0909   HCT 44.9 11/10/2015 0909   PLT 380 11/10/2015 0909   MCV 91.6 11/10/2015 0909   MCH 30.6 11/10/2015 0909   MCHC 33.4 11/10/2015 0909   RDW 14.3 11/10/2015 0909   LYMPHSABS 1.8 01/20/2015 1035   MONOABS 0.6 01/20/2015 1035   EOSABS 0.3 01/20/2015 1035   BASOSABS 0.1 01/20/2015 1035                          BMET    Component Value Date/Time   NA 141 11/10/2015 0909   K 3.4 (L) 11/10/2015 0909   CL 105 11/10/2015 0909   CO2 26 11/10/2015 0909   GLUCOSE 93 11/10/2015 0909   BUN 8 11/10/2015 0909   CREATININE 0.90 11/10/2015 0909   CREATININE 0.85 06/02/2015 0844   CALCIUM 8.9 11/10/2015 0909   GFRNONAA >60 11/10/2015 0909   GFRAA >60 11/10/2015 0909     Assessment/Plan: Patient presenting with quadriparesis secondary to cervical myelopathy secondary to significant cervical stenosis at the C3-4 and C4-5 levels with large spinal disc herniation and significant spinal cord compression with increased signal from within the spinal cord at both levels. We do see degenerative changes at C5-6 and C6-7, but the worst compression was seen at C3-4 and C4-5. Patient admitted now for a C3-4 and C4-5 anterior cervical decompression and arthrodesis with structural allograft and cervical plating.  I've discussed with the patient the nature of his condition, the nature the surgical procedure, the typical length of surgery, hospital stay, and overall recuperation. We discussed limitations postoperatively. I discussed risks of surgery including risks of infection, bleeding, possibly need for transfusion, the risk of nerve root dysfunction with pain, weakness, numbness, or paresthesias, the risk of  spinal cord dysfunction with paralysis of all 4 limbs and quadriplegia, and the risk of dural tear and CSF leakage and possible  need for further surgery, the risk of esophageal dysfunction causing dysphagia and the risk of laryngeal dysfunction causing hoarseness of the voice, the risk of failure of the arthrodesis and the possible need for further surgery, and the risk of anesthetic complications including myocardial infarction, stroke, pneumonia, and death. We also discussed the need for postoperative immobilization in a cervical collar. Understanding all this the patient does wish to proceed with surgery and is admitted for such.    Hosie Spangle, MD 11/12/2015 12:57 PM

## 2015-11-12 NOTE — Op Note (Signed)
11/12/2015  3:42 PM  PATIENT:  [name]  64 y.o. male  PRE-OPERATIVE DIAGNOSIS:  Cervical stenosis with myelopathy, C3-4 and C4-5 cervical disc herniation with myelopathy, cervical spondylosis with myelopathy, cervical degenerative disc disease, quadriparesis  POST-OPERATIVE DIAGNOSIS:  Cervical stenosis with myelopathy, C3-4 and C4-5 cervical disc herniation with myelopathy, cervical spondylosis with myelopathy, cervical degenerative disc disease, quadriparesis  PROCEDURE:  Procedure(s):  C3-4 and C4-5 anterior cervical decompression and arthrodesis with structural allograft and aviator cervical plating  SURGEON:  Surgeon(s): Jovita Gamma, MD Erline Levine, MD  ASSISTANTS: Erline Levine, M.D.  ANESTHESIA:   general  EBL:  Total I/O In: 1400 [I.V.:1400] Out: -   BLOOD ADMINISTERED:none  COUNT: Correct per nursing staff  DICTATION: Patient was brought to the operating room placed under general endotracheal anesthesia. Patient was placed in 10 pounds of halter traction. The neck was prepped with Betadine soap and solution and draped in a sterile fashion. A horizontal incision was made on the left side of the neck. The line of the incision was infiltrated with local anesthetic with epinephrine. Dissection was carried down thru the subcutaneous tissue and platysma, bipolar cautery was used to maintain hemostasis. Dissection was then carried out thru an avascular plane leaving the sternocleidomastoid carotid artery and jugular vein laterally and the trachea and esophagus medially. The ventral aspect of the vertebral column was identified and a localizing x-ray was taken. The C3-4 and C4-5 levels were identified. The annulus at each level was incised and the disc space entered. Discectomy was performed with micro-curettes and pituitary rongeurs. The operating microscope was draped and brought into the field provided additional magnification illumination and visualization. Discectomy was  continued posteriorly thru the disc space and then the cartilaginous endplate was removed using micro-curettes along with the high-speed drill. Posterior osteophytic overgrowth was removed each level using the high-speed drill along with a 2 mm thin footplated Kerrison punch. Posterior longitudinal ligament along with disc herniation was carefully removed, decompressing the spinal canal and thecal sac. We then continued to remove osteophytic overgrowth and disc material decompressing the neural foramina and exiting nerve roots bilaterally. Once the decompression was completed hemostasis was established at each level with the use of Gelfoam with thrombin and bipolar cautery. The Gelfoam was removed the wound irrigated and hemostasis confirmed and a thin layer of Surgifoam was applied. We then measured the height of the intravertebral disc space level and selected a 8 millimeter in height structural allograft for the C3-4 level and a 8 millimeter in height structural allograft for the C4-5 level . Each was hydrated and saline solution and then gently positioned in the intravertebral disc space and countersunk. We then selected a 37 millimeter in height Aviator cervical plate. It was positioned over the fusion construct and secured to the vertebra with a pair of 4 x 14 screws at the C3 level, a pair of 4 x 14 screws at the C4 level, and a pair of 4 x 14 screws at the C5 level. Each screw hole was started with the high-speed drill and then the screws placed, once all the screws were placed, the locking system was secured. The wound was irrigated with bacitracin solution checked for hemostasis which was established and confirmed. An x-ray was taken which showed the grafts in good position, the plate and screws in good position, and the overall alignment and construct looked good. We then proceeded with closure. The platysma was closed with interrupted inverted 2-0 undyed Vicryl suture, the subcutaneous and  subcuticular  closed with interrupted inverted 3-0 undyed Vicryl suture. The skin edges were approximated with Dermabond. Following surgery the patient was taken out of cervical traction. To be reversed and the anesthetic and taken to the recovery room for further care.  PLAN OF CARE: Admit for overnight observation  PATIENT DISPOSITION:  PACU - hemodynamically stable.   Delay start of Pharmacological VTE agent (>24hrs) due to surgical blood loss or risk of bleeding:  yes

## 2015-11-12 NOTE — Anesthesia Preprocedure Evaluation (Addendum)
Anesthesia Evaluation  Patient identified by MRN, date of birth, ID band Patient awake    Reviewed: Allergy & Precautions, NPO status , Patient's Chart, lab work & pertinent test results  History of Anesthesia Complications Negative for: history of anesthetic complications  Airway Mallampati: II  TM Distance: >3 FB Neck ROM: Full    Dental  (+) Dental Advisory Given   Pulmonary sleep apnea (does not tolerated CPAP, uses BiPAP) , COPD,  COPD inhaler, former smoker,    breath sounds clear to auscultation       Cardiovascular hypertension, Pt. on medications and Pt. on home beta blockers (-) angina+ CAD and + Past MI   Rhythm:Regular Rate:Normal   '09 cath: Mid RCA CTO w/ L-R collateralization, nonobstructive dz elsewhere  1994 MI with subsequent PTCA?   Neuro/Psych Anxiety Depression myelopathic    GI/Hepatic Neg liver ROS, GERD  Medicated and Controlled,  Endo/Other  Morbid obesity  Renal/GU negative Renal ROS     Musculoskeletal  (+) Arthritis ,   Abdominal (+) + obese,   Peds  Hematology negative hematology ROS (+)   Anesthesia Other Findings   Reproductive/Obstetrics                            Anesthesia Physical Anesthesia Plan  ASA: III  Anesthesia Plan: General   Post-op Pain Management:    Induction: Intravenous  Airway Management Planned: Oral ETT and Video Laryngoscope Planned  Additional Equipment:   Intra-op Plan:   Post-operative Plan: Extubation in OR  Informed Consent: I have reviewed the patients History and Physical, chart, labs and discussed the procedure including the risks, benefits and alternatives for the proposed anesthesia with the patient or authorized representative who has indicated his/her understanding and acceptance.   Dental advisory given  Plan Discussed with: CRNA and Surgeon  Anesthesia Plan Comments: (Plan routine monitors, GETA with  VideoGlide intubation)        Anesthesia Quick Evaluation

## 2015-11-12 NOTE — Transfer of Care (Signed)
Immediate Anesthesia Transfer of Care Note  Patient: [name]  Procedure(s) Performed: Procedure(s) with comments: ANTERIOR CERVICAL DECOMPRESSION/DISCECTOMY FUSION CERVICAL THREE- CERVICAL FOUR, CERVICAL FOUR- CERVICAL FIVE (N/A) - ANTERIOR CERVICAL DECOMPRESSION/DISCECTOMY FUSION C3-C4, C4-C5  Patient Location: PACU  Anesthesia Type:General  Level of Consciousness: awake, alert  and oriented  Airway & Oxygen Therapy: Patient Spontanous Breathing and Patient connected to nasal cannula oxygen  Post-op Assessment: Report given to RN and Post -op Vital signs reviewed and stable  Post vital signs: Reviewed and stable  Last Vitals:  Vitals:   11/12/15 1630 11/12/15 1645  BP:    Pulse: 70 74  Resp: 16 14  Temp:      Last Pain:  Vitals:   11/12/15 1600  TempSrc:   PainSc: 0-No pain      Patients Stated Pain Goal: 1 ([id number])  Complications: No apparent anesthesia complications

## 2015-11-12 NOTE — Anesthesia Procedure Notes (Signed)
Procedure Name: Intubation Date/Time: 11/12/2015 1:15 PM Performed by: Eligha Bridegroom Pre-anesthesia Checklist: Patient identified, Emergency Drugs available, Suction available, Patient being monitored and Timeout performed Patient Re-evaluated:Patient Re-evaluated prior to inductionOxygen Delivery Method: Circle system utilized Preoxygenation: Pre-oxygenation with 100% oxygen Intubation Type: IV induction Ventilation: Mask ventilation without difficulty and Oral airway inserted - appropriate to patient size Laryngoscope Size: Glidescope and 4 Grade View: Grade III Tube type: Oral Tube size: 7.5 mm Number of attempts: 1 Airway Equipment and Method: Stylet and Video-laryngoscopy Placement Confirmation: ETT inserted through vocal cords under direct vision,  positive ETCO2 and breath sounds checked- equal and bilateral Secured at: 23 cm Tube secured with: Tape Dental Injury: Teeth and Oropharynx as per pre-operative assessment

## 2015-11-13 ENCOUNTER — Encounter (HOSPITAL_COMMUNITY): Payer: Self-pay | Admitting: Neurosurgery

## 2015-11-13 DIAGNOSIS — M5001 Cervical disc disorder with myelopathy,  high cervical region: Secondary | ICD-10-CM | POA: Diagnosis not present

## 2015-11-13 MED ORDER — HYDROCODONE-ACETAMINOPHEN 5-325 MG PO TABS: 1.0000 | ORAL_TABLET | ORAL | 0 refills | Status: DC | PRN

## 2015-11-13 NOTE — Progress Notes (Signed)
Pt and wife given D/C instructions with Rx, verbal understanding was provided. Pt's incision is open to air and has no sign of infection. Pt's IV was removed prior to D/C. Pt D/C'd home via walking @ 1020 per MD order. Pt is stable @ D/C and has no other needs at this time. Holli Humbles, RN

## 2015-11-13 NOTE — Discharge Summary (Signed)
Physician Discharge Summary  Patient ID: [name] MRN: [id number] DOB/AGE: 66-Feb-1952 66 y.o.  Admit date: 11/12/2015 Discharge date: 11/13/2015  Admission Diagnoses:  Cervical stenosis with myelopathy, C3-4 and C4-5 cervical disc herniation with myelopathy, cervical spondylosis with myelopathy, cervical degenerative disc disease, quadriparesis  Discharge Diagnoses:  Cervical stenosis with myelopathy, C3-4 and C4-5 cervical disc herniation with myelopathy, cervical spondylosis with myelopathy, cervical degenerative disc disease, quadriparesis Active Problems:   Cervical spinal stenosis   Discharged Condition: good  Hospital Course: Patient was admitted, underwent a C3-4 and C4-5 ACDF with structural allograft and aviator cervical plating. He is done well. He is up and ambulate actively. His wound is healing nicely. He is asking to be discharged to home. He has been given instructions regarding wound care and activities following discharge. He is scheduled follow-up with me in the office in about 3 weeks.  Discharge Exam: Blood pressure 125/72, pulse 64, temperature 98.3 F (36.8 C), temperature source Oral, resp. rate 18, height 5\' 9"  (1.753 m), weight 105.2 kg (232 lb), SpO2 97 %.  Disposition: 01-Home or Self Care     Medication List    TAKE these medications   albuterol 108 (90 Base) MCG/ACT inhaler Commonly known as:  PROVENTIL HFA;VENTOLIN HFA Inhale 1 puff into the lungs every 6 (six) hours as needed for wheezing or shortness of breath. Reported on 07/16/2015   ALPRAZolam 0.5 MG tablet Commonly known as:  XANAX Take 1 mg by mouth at bedtime as needed for sleep.   amLODipine 5 MG tablet Commonly known as:  NORVASC Take 1 tablet (5 mg total) by mouth daily.   butalbital-acetaminophen-caffeine 50-325-40 MG tablet Commonly known as:  FIORICET, ESGIC Take 1 tablet by mouth 2 (two) times daily as needed for headache.   citalopram 10 MG tablet Commonly known as:   CELEXA Take 10 mg by mouth daily.   fluticasone 50 MCG/ACT nasal spray Commonly known as:  FLONASE Place 1 spray into both nostrils daily.   HYDROcodone-acetaminophen 5-325 MG tablet Commonly known as:  NORCO/VICODIN Take 1-2 tablets by mouth every 4 (four) hours as needed (mild pain).   ipratropium 0.03 % nasal spray Commonly known as:  ATROVENT Place 2 sprays into both nostrils at bedtime.   mesalamine 0.375 g 24 hr capsule Commonly known as:  APRISO Take 4 capsules (1.5 g total) by mouth daily.   metoprolol succinate 50 MG 24 hr tablet Commonly known as:  TOPROL-XL Take 50 mg by mouth daily. Take with or immediately following a meal.   nitroGLYCERIN 0.4 MG SL tablet Commonly known as:  NITROSTAT Place 0.4 mg under the tongue every 5 (five) minutes as needed for chest pain. Reported on 07/16/2015   NON FORMULARY Bipap machine   ramipril 10 MG capsule Commonly known as:  ALTACE TAKE 1 CAPSULE DAILY What changed:  See the new instructions.   VIAGRA 100 MG tablet Generic drug:  sildenafil Take 100 mg by mouth as needed for erectile dysfunction.        SignedHosie Spangle 11/13/2015, 8:32 AM

## 2015-11-13 NOTE — Progress Notes (Signed)
RT spoke with patient about wearing CPAP and the patient states that he wears BiPAP and wished to not wear it here at the hospital.  Pt wearing O2 and states he would be ok for one night.  RT advised patient and wife that if he changes his mind that the RN can notify respiratory.  RT will continue to monitor.

## 2015-11-13 NOTE — Discharge Instructions (Signed)

## 2015-12-04 ENCOUNTER — Ambulatory Visit (INDEPENDENT_AMBULATORY_CARE_PROVIDER_SITE_OTHER): Payer: Medicare Other | Admitting: Cardiovascular Disease

## 2015-12-04 ENCOUNTER — Encounter: Payer: Self-pay | Admitting: Cardiovascular Disease

## 2015-12-04 VITALS — BP 111/66 | HR 67 | Ht 69.0 in | Wt 225.0 lb

## 2015-12-04 DIAGNOSIS — M4712 Other spondylosis with myelopathy, cervical region: Secondary | ICD-10-CM | POA: Diagnosis not present

## 2015-12-04 DIAGNOSIS — E669 Obesity, unspecified: Secondary | ICD-10-CM

## 2015-12-04 DIAGNOSIS — E785 Hyperlipidemia, unspecified: Secondary | ICD-10-CM | POA: Diagnosis not present

## 2015-12-04 DIAGNOSIS — M5000 Cervical disc disorder with myelopathy, unspecified cervical region: Secondary | ICD-10-CM | POA: Diagnosis not present

## 2015-12-04 DIAGNOSIS — Z6833 Body mass index (BMI) 33.0-33.9, adult: Secondary | ICD-10-CM | POA: Diagnosis not present

## 2015-12-04 DIAGNOSIS — Z981 Arthrodesis status: Secondary | ICD-10-CM | POA: Diagnosis not present

## 2015-12-04 DIAGNOSIS — I10 Essential (primary) hypertension: Secondary | ICD-10-CM

## 2015-12-04 DIAGNOSIS — G4733 Obstructive sleep apnea (adult) (pediatric): Secondary | ICD-10-CM

## 2015-12-04 DIAGNOSIS — M5030 Other cervical disc degeneration, unspecified cervical region: Secondary | ICD-10-CM | POA: Diagnosis not present

## 2015-12-04 DIAGNOSIS — I2510 Atherosclerotic heart disease of native coronary artery without angina pectoris: Secondary | ICD-10-CM | POA: Diagnosis not present

## 2015-12-04 NOTE — Progress Notes (Signed)
Patient ID: [name], male   DOB: [birth year], 65 y.o.   MRN: [id number]     HPI: [name] is a 65 y.o. male who is a former patient of Dr. Rollene Mckee .  I last saw him in May 2017.  He presents for a 5 month follow-up evaluation.  [name] has CAD and suffered an inferior wall MI in 1994.  Cardiac catheterization revealed RCA occlusion with left-to-right collaterals. He was treated initially with PTCA of his RCA. His last cardiac catheterization was in 2009 which showed an RCA occlusion with collaterals with no significant LAD disease, although there was mild 20% diagonal stenosis.  An echo Doppler study done in August 2014 by Dr. Rollene Mckee showed mild LVH with mild hypokinesis of the inferolateral wall but with preserved global systolic function with an EF of 55-60%. He had normal diastolic parameters.  In January 2015 he was admitted by the hospitalist service with chest pain. A stress Myoview study did not demonstrate convincing inducible myocardial ischemia. There was limited evaluation of the inferior wall due to movement and overlap of the diaphragm and activity within the left lobe of the liver. Ejection fraction was 47%. He was seen by Dr. Rayann Mckee for cardiology consultation who recommended no further cardiac workup.  Lipid evaluation reveals a total cholesterol 163 triglycerides 100 HDL 31 LDL 112 in the past, he had some difficulty taking statins.  Additional problems include hypertension, obesity, and arthritis. He has a remote history of right buttock abscess leading to hospitalization in December 2013.  When I initially saw him, I discontinued his gemfibrozil and recommended a trial of Livalo to take and hopefully tolerate  along with coenzyme Q10.  I titrated his amlodipine to 5 mg daily for more optimal blood pressure control.  I was also very concerned about obstructive sleep apnea and referred him for a sleep study.  Due to my concerns for obstructive sleep apnea, he underwent a  split-night protocol, on 05/10/2012.  This revealed severe sleep apnea with an AHI of 75.6 per hour.  During REM sleep this was 40 per hour.  He had significant oxygen desaturation to 78% with non-REM sleep and 76% with REM sleep.  He also had a significant positional component to his sleep apnea.  He was started on CPAP titration but due to high-pressure he was was switched to a BiPAP mode.  Ultimately, a BiPAP auto unit was recommended with an EPAP minimum of 10 and a IPAP maximum up to 25.  He also did have periodic lid movements with sleep with an index of 18.  He apparently initially used BiPAP for only 3 weeks but was having difficulty with his mass.  However, before you in being seen in follow-up evaluation.  He returned his BiPAP unit due to concerns that he was not compliant and would have to pay for this without insurance.  Since I last saw him, sleep was very poor, he was unable to sleep on his back, and snored loudly.  I referred him for a follow-up sleep study which was done on 08/10/2015.  This confirms severe sleep apnea with an AHI of 95.7.  He ultimately underwent CPAP and then BiPAP titration and was titrated up to 17/13.  A BiPAP auto therapy was recommended with an EPAP minimum of 10 and IPAP maximum of 25.  He received his new BiPAP machine on 09/05/2015 and is compliant until the very beginning of October.  I reviewed his [employer] compliance data, which  documents compliance, both for usage stays as well as usage greater than 4 hours.  However, on 11/12/2015.  He underwent cervical neck surgery by Dr. Lucia Mckee and as result is having to wear a stiff neck collar.  He has not been able to use his CPAP.  While wearing the neck collar due to the inability for his mask to sit appropriately.  Of note, when I did review his download, he has significant mask leak.  His DME company is [employer].  He denies any episodes of chest pain.  He admits to mild shortness of breath with activity.  He was  diagnosed with Crohn's disease following a recent colonoscopy.  He has had issues with anal fissures and fistula.     Past Medical History:  Diagnosis Date  . Anginal pain ([place])   . Anxiety   . Arthritis   . CAD (coronary artery disease)    a. Mid RCA CTO w/ L-R collateralization, nonobstructive dz elsewhere by 2009 cath  . Complication of anesthesia    heart rate dropped twice during surg  . Depression   . GERD (gastroesophageal reflux disease)   . Headache   . Hepatitis    in the early 70's not sure what kind  . History of tobacco abuse   . Hyperlipidemia    a. statin, niacin intolerant  . Hypertension   . Myocardial infarct    a. MI in 1994 s/p PTCA alone  . Nephrolithiasis   . Obesity   . Sleep apnea    does not tol BIPAP or CPAP    Past Surgical History:  Procedure Laterality Date  . ANAL FISTULOTOMY N/A 06/28/2013   Procedure: ANAL FISTULOTOMY;  Surgeon: Gregory So, MD;  Location: [facility];  Service: General;  Laterality: N/A;  . ANTERIOR CERVICAL DECOMP/DISCECTOMY FUSION N/A 11/12/2015   Procedure: ANTERIOR CERVICAL DECOMPRESSION/DISCECTOMY FUSION CERVICAL THREE- CERVICAL FOUR, CERVICAL FOUR- CERVICAL FIVE;  Surgeon: Gregory Gamma, MD;  Location: [facility];  Service: Neurosurgery;  Laterality: N/A;  ANTERIOR CERVICAL DECOMPRESSION/DISCECTOMY FUSION C3-C4, C4-C5  . BACK SURGERY    . BIOPSY THYROID    . [place], 2009   a. PTCA alone in 1994 b. RCA CTO w/ L-R collaterals, 40-50% prox RCA, 20% prox LAD; EF 50-55%, inferoapical HK  . CARDIAC CATHETERIZATION  04/02/2001   Continue medical therapy.  Marland Kitchen CARDIAC CATHETERIZATION  11/07/2000   Continue medical therapy.  Marland Kitchen CARDIAC CATHETERIZATION  09/24/1996   Continue medical therapy  . CARDIOVASCULAR STRESS TEST  04/06/2010   No scintigraphic evidence of inducible myocardial ischemia. No ECG changes. EKG negative for ischemia.  Marland Kitchen CARPAL TUNNEL RELEASE Left 10/09/2014   Procedure: LEFT CARPAL TUNNEL RELEASE;   Surgeon: Gregory Cover, MD;  Location: [facility];  Service: Orthopedics;  Laterality: Left;  . COLONOSCOPY N/A 11/06/2014   RMR: Skipped Colonic ulcerations with significant ileocecal valve involvement and rectal sparing most consisitant with Crohns disease. Minimal non-steroidal drug use. Probable colonic lipoma  . CYSTECTOMY    . INCISION AND DRAINAGE ABSCESS Right 05/23/2012   Procedure: INCISION AND DRAINAGE ABSCESS;  Surgeon: Gregory So, MD;  Location: [facility];  Service: General;  Laterality: Right;  . KIDNEY STONE SURGERY  2004  . KNEE SURGERY    . LOWER EXTREMITY ARTERIAL DOPPLER  08/02/2007   Bilateral ABIs-no evidence of arterial insufficiency. Bilateral PVRs demonstrate normal waveforms.  Marland Kitchen NOSE SURGERY    . PILONIDAL CYST EXCISION    . SPLENECTOMY    .  TRANSTHORACIC ECHOCARDIOGRAM  03/05/2009   EF >55%, normal LV wall thickness.  . WRIST SURGERY     carpal tunnel    Allergies  Allergen Reactions  . Niacin And Related Other (See Comments)    flush  . Penicillins Hives and Itching    Has patient had a PCN reaction causing immediate rash, facial/tongue/throat swelling, SOB or lightheadedness with hypotension: Yes Has patient had a PCN reaction causing severe rash involving mucus membranes or skin necrosis: No Has patient had a PCN reaction that required hospitalization No Has patient had a PCN reaction occurring within the last 10 years: No If all of the above answers are "NO", then may proceed with Cephalosporin use.   . Statins     myalgias    Current Outpatient Prescriptions  Medication Sig Dispense Refill  . albuterol (PROVENTIL HFA;VENTOLIN HFA) 108 (90 BASE) MCG/ACT inhaler Inhale 1 puff into the lungs every 6 (six) hours as needed for wheezing or shortness of breath. Reported on 07/16/2015    . ALPRAZolam (XANAX) 0.5 MG tablet Take 1 mg by mouth at bedtime as needed for sleep.     Marland Kitchen amLODipine (NORVASC) 5 MG tablet Take 1 tablet (5 mg total) by mouth  daily. 180 tablet 3  . butalbital-acetaminophen-caffeine (FIORICET, ESGIC) 50-325-40 MG tablet Take 1 tablet by mouth as needed for headache.     . citalopram (CELEXA) 10 MG tablet Take 5 mg by mouth daily.     . fluticasone (FLONASE) 50 MCG/ACT nasal spray Place 1 spray into both nostrils daily.    Marland Kitchen HYDROcodone-acetaminophen (NORCO/VICODIN) 5-325 MG tablet Take 1-2 tablets by mouth every 4 (four) hours as needed (mild pain). 40 tablet 0  . ipratropium (ATROVENT) 0.03 % nasal spray Place 2 sprays into both nostrils at bedtime.   5  . mesalamine (APRISO) 0.375 g 24 hr capsule Take 4 capsules (1.5 g total) by mouth daily. 120 capsule 11  . metoprolol succinate (TOPROL-XL) 50 MG 24 hr tablet Take 50 mg by mouth daily. Take with or immediately following a meal.    . nitroGLYCERIN (NITROSTAT) 0.4 MG SL tablet Place 0.4 mg under the tongue every 5 (five) minutes as needed for chest pain. Reported on 07/16/2015    . NON FORMULARY Bipap machine    . ramipril (ALTACE) 10 MG capsule TAKE 1 CAPSULE DAILY (Patient taking differently: TAKE 1 CAPSULE (10 mg) DAILY) 90 capsule 2  . VIAGRA 100 MG tablet Take 100 mg by mouth as needed for erectile dysfunction.      No current facility-administered medications for this visit.     Social History   Social History  . Marital status: Married    Spouse name: N/A  . Number of children: N/A  . Years of education: N/A   Occupational History  . Not on file.   Social History Main Topics  . Smoking status: Former Smoker    Packs/day: 2.00    Years: 28.00    Types: Cigarettes    Quit date: 03/28/1990  . Smokeless tobacco: Former Systems developer    Types: Chew  . Alcohol use No  . Drug use: No  . Sexual activity: Yes   Other Topics Concern  . Not on file   Social History Narrative   Lives in [place], [name]. Married, 2 children.     Family History  Problem Relation Age of Onset  . CAD Father     CABG   . CAD Brother     CABG in  85s  . CAD Brother   .  Arthritis/Rheumatoid Mother   . Hyperlipidemia Mother   . Thrombocytopenia Mother   . COPD Son     smoker  . Alzheimer's disease Maternal Grandmother   . Lung disease Paternal Grandfather    ROS General: Negative; No fevers, chills, or night sweats HEENT: Negative; No changes in vision or hearing, sinus congestion, difficulty swallowing Pulmonary: Negative; No cough, wheezing, shortness of breath, hemoptysis Cardiovascular: Negative; No chest pain, presyncope, syncope, palpatations GI: Negative; No nausea, vomiting, diarrhea, or abdominal pain GU: Negative; No dysuria, hematuria, or difficulty voiding Musculoskeletal: Positive for cervical disc disease. Hematologic: Negative; no easy bruising, bleeding Endocrine: Negative; no heat/cold intolerance Neuro: Negative; no changes in balance, headaches Skin: Negative; No rashes or skin lesions Psychiatric: Negative; No behavioral problems, depression Sleep: positive severe sleep apnea, Now having received another BiPAP unit  daytime sleepiness, hypersomnolence, and snoring, no bruxism, he admits to limb movement, but denies painful restless legs, no hypnogognic hallucinations, no cataplexy   PE BP 111/66 (BP Location: Right Arm, Patient Position: Sitting, Cuff Size: Normal)   Pulse 67   Ht 5' 9" (1.753 m)   Wt 225 lb (102.1 kg)   SpO2 97%   BMI 33.23 kg/m    Wt Readings from Last 3 Encounters:  12/04/15 225 lb (102.1 kg)  11/12/15 232 lb (105.2 kg)  11/10/15 232 lb 12.8 oz (105.6 kg)   General: Alert, oriented, no distress.  Skin: normal turgor, no rashes HEENT: Normocephalic, atraumatic. Pupils round and reactive; sclera anicteric;no lid lag. Extraocular muscles intact;; no xanthelasmas. Nose without nasal septal hypertrophy Mouth/Parynx benign; Mallinpatti scale 3 Neck: He is wearing a full neck brace. Lungs: Clear to to ausculatation and percussion; no wheezing or rales Chest wall: no tenderness to palpitation; pectus  excavatum chest wall deformity Heart: RRR, s1 s2 normal; 1/6 systolic murmur;no diastolic murmur, rub thrills or heaves Abdomen: soft, nontender; no hepatosplenomehaly, BS+; abdominal aorta nontender and not dilated by palpation. Back: no CVA tenderness Pulses 2+ Extremities: no clubbing cyanosis or edema, Homan's sign negative  Neurologic: grossly nonfocal; cranial nerves grossly normal. Psychologic: normal affect and mood.  ECG (independently read by me): Sinus rhythm with first-degree AV block.  Will intervals.  May 2017 ECG (independently read by me): Normal sinus rhythm at 73 bpm.  Small nondiagnostic inferior Q waves.  No ST segment changes.  April 2016 ECG (independently read by me): Normal sinus rhythm at 68 bpm.  Nonspecific T changes.  Normal intervals.  April 2015 ECG (independently read by me): Normal sinus rhythm at 61 beats per minute.  Normal intervals  Prior 03/28/2013 ECG (independently read by me): Sinus rhythm at 66 beats per minute. No ectopy. Q-wave in lead 3   LABS:  BMP Latest Ref Rng & Units 11/10/2015 06/02/2015 01/20/2015  Glucose 65 - 99 mg/dL 93 107(H) 159(H)  BUN 6 - 20 mg/dL _0 Creatinine 0.61 - 1.24 mg/dL 0.90 0.85 0.87  Sodium 135 - 145 mmol/L 141 138 138  Potassium 3.5 - 5.1 mmol/L 3.4(L) 4.5 4.3  Chloride 101 - 111 mmol/L 105 101 101  CO2 22 - 32 mmol/L _1 Calcium 8.9 - 10.3 mg/dL 8.9 9.5 9.0    Hepatic Function Latest Ref Rng & Units 01/20/2015 06/10/2014 02/15/2013  Total Protein 6.1 - 8.1 g/dL 6.9 7.3 8.2  Albumin 3.6 - 5.1 g/dL 3.8 4.2 4.0  AST 10 - 35 U/L _2 ALT 9 -  46 U/L _0 Alk Phosphatase 40 - 115 U/L 104 102 114  Total Bilirubin 0.2 - 1.2 mg/dL 0.4 0.5 0.2(L)    CBC Latest Ref Rng & Units 11/10/2015 01/20/2015 10/09/2014  WBC 4.0 - 10.5 K/uL 6.5 6.3 -  Hemoglobin 13.0 - 17.0 g/dL 15.0 15.1 14.8  Hematocrit 39.0 - 52.0 % 44.9 43.6 -  Platelets 150 - 400 K/uL 380 465(H) -    Lab Results  Component Value Date    MCV 91.6 11/10/2015   MCV 88.8 01/20/2015   MCV 90.1 06/10/2014    Lab Results  Component Value Date   TSH 1.315 06/10/2014   No results found for: HGBA1C   Lipid Panel     Component Value Date/Time   CHOL 192 06/10/2014 0811   TRIG 170 (H) 06/10/2014 0811   HDL 32 (L) 06/10/2014 0811   CHOLHDL 6.0 06/10/2014 0811   VLDL 34 06/10/2014 0811   LDLCALC 126 (H) 06/10/2014 0811   BNP No results found for: PROBNP   RADIOLOGY: No results found.    ASSESSMENT AND PLAN: [name] is a 65 year old gentleman who suffered an inferior wall myocardial infarction in 1994 due to total RCA occlusion with good left to right collaterals without significant contralateral disease. His last nuclear perfusion study did not demonstrate significant ischemia. However, ejection fraction was 47%. His echo Doppler study did show inferolateral wall motion abnormality.  He has been fairly stable without recurrent anginal symptoms.  He is on amlodipine 5 mg, Toprol-XL 50 mg, ramipril 10 mg daily, both for blood pressure and CAD.  He has not required any nitrates.  He has a prescription for Viagra for erectile dysfunction but has never used this. He developed significant myalgias to statins and is not on therapy.  His LDL last year was elevated at 126.  He may be a candidate for Zetia.  Lab request, he underwent a follow-up sleep study which confirmed very severe sleep apnea.  He now received a new BiPAP machine.  He admits to meeting compliance standards until October 5 when he underwent neck surgery and as result, has not been able to use his BiPAP while he has his full neck brace in place.  His download shows good compliance from August and September.  He does have significant mask leak.  I discussed with him that he will ultimately need to get refitted for new mask.  His 95th percent pressures were EPAP of 12.4 and an IPAP of 16.4 with a maximum of 13.3 and 17.3 respectively.  I again discussed with him the  importance of sleeping the entire night with sleep with his BiPAP unit, particularly since preponderance of REM sleep occurs in second phase of sleep.  I will see him in 6 months for cardiology reevaluation.  Time spent: 25 minutes Troy Sine, MD, [facility]  12/04/2015 6:50 PM

## 2015-12-04 NOTE — Patient Instructions (Signed)
Your physician wants you to follow-up in: 6 months or sooner if needed. You will receive a reminder letter in the mail two months in advance. If you don't receive a letter, please call our office to schedule the follow-up appointment.   If you need a refill on your cardiac medications before your next appointment, please call your pharmacy. 

## 2015-12-10 ENCOUNTER — Telehealth: Payer: Self-pay

## 2015-12-10 ENCOUNTER — Ambulatory Visit (INDEPENDENT_AMBULATORY_CARE_PROVIDER_SITE_OTHER): Payer: Medicare Other | Admitting: Nurse Practitioner

## 2015-12-10 ENCOUNTER — Other Ambulatory Visit: Payer: Self-pay

## 2015-12-10 ENCOUNTER — Encounter: Payer: Self-pay | Admitting: Nurse Practitioner

## 2015-12-10 VITALS — BP 119/73 | HR 67 | Temp 97.0°F | Ht 65.0 in | Wt 226.6 lb

## 2015-12-10 DIAGNOSIS — K604 Rectal fistula: Secondary | ICD-10-CM

## 2015-12-10 DIAGNOSIS — I2510 Atherosclerotic heart disease of native coronary artery without angina pectoris: Secondary | ICD-10-CM | POA: Diagnosis not present

## 2015-12-10 DIAGNOSIS — K50111 Crohn's disease of large intestine with rectal bleeding: Secondary | ICD-10-CM

## 2015-12-10 NOTE — Patient Instructions (Signed)
1. Have your blood work drawn when you're able to. 2. We will refer you back to the surgeon for follow-up. 3. I will discuss with [name] options for different maintenance medication on Crohn's disease. We will call you with recommendations. 4. Return for follow-up in 6 months. 5. Call if any worsening symptoms.

## 2015-12-10 NOTE — Assessment & Plan Note (Signed)
The patient's symptoms are relatively well controlled on a present. However, the pills are large and he is having difficulty swallowing them after neck surgery which is not likely to improve. The pills cannot be crushed. He is asking if we can see if there is other options for him for Crohn's maintenance. We'll discuss with [name] and call the patient with recommendations. Return for follow-up in 6 months.  Check CBC, CMP, CRP today.

## 2015-12-10 NOTE — Assessment & Plan Note (Signed)
He has had a chronic fistula and saw Dr. gross regarding surgical repair. They wanted his Crohn's symptoms better controlled before they would see him back. This is approximately a year and a half ago. His current symptoms are generally well managed at this point on Apriso. We will refer him back to surgery to investigate repair of this fistula. Return for follow-up in 6 months.

## 2015-12-10 NOTE — Progress Notes (Signed)
cc'ed to pcp °

## 2015-12-10 NOTE — Progress Notes (Signed)
Referring Provider: Redmond School, MD Primary Care Physician:  [name]., MD Primary GI:  [name]  Chief Complaint  Patient presents with  . Follow-up    HPI:   [name] is a 65 y.o. male who presents for follow-up on Crohn's disease. The patient was last seen in our office 06/09/2015 at which point he was doing well with no overt GI symptoms. Still some proctalgia, nitroglycerin remission cream helps but he has headaches when he uses it. He did have a history of rectal fistula and fissure, had seen 2 surgeons but did not like either 1. Surgery possible option in the future if his symptoms are persistent. Sent in compound cream of diltiazem 2% with lidocaine 5% to [facility], requested two-week progress report. At that time he was on a present with good results, last colonoscopy 2016 up-to-date. Recommended 6 month routine follow-up.  Today he states he wants to go on a different pill for his Crohn's. He recently had surgery and was taken off of it temporarily, had no symptoms off the Apriso. When he started taking it again the fistula area near his rectum began to be sore and drain again. Is currently having 2-3 bowel movements a day, which vary in consistency. Denies hematochezia, melena. His major issue is difficulty swallowing after neck surgery and the Apriso pills are too large. Last saw Dr. Johney Maine in surgery but wanted Crohn's under control first. Energy level is poor since surgery. Denies N/V. Denies any other upper or lower GI symptoms.  Past Medical History:  Diagnosis Date  . Anginal pain ([place])   . Anxiety   . Arthritis   . CAD (coronary artery disease)    a. Mid RCA CTO w/ L-R collateralization, nonobstructive dz elsewhere by 2009 cath  . Complication of anesthesia    heart rate dropped twice during surg  . Depression   . GERD (gastroesophageal reflux disease)   . Headache   . Hepatitis    in the early 70's not sure what kind  . History of  tobacco abuse   . Hyperlipidemia    a. statin, niacin intolerant  . Hypertension   . Myocardial infarct    a. MI in 1994 s/p PTCA alone  . Nephrolithiasis   . Obesity   . Sleep apnea    does not tol BIPAP or CPAP    Past Surgical History:  Procedure Laterality Date  . ANAL FISTULOTOMY N/A 06/28/2013   Procedure: ANAL FISTULOTOMY;  Surgeon: [name], MD;  Location: [facility];  Service: General;  Laterality: N/A;  . ANTERIOR CERVICAL DECOMP/DISCECTOMY FUSION N/A 11/12/2015   Procedure: ANTERIOR CERVICAL DECOMPRESSION/DISCECTOMY FUSION CERVICAL THREE- CERVICAL FOUR, CERVICAL FOUR- CERVICAL FIVE;  Surgeon: Jovita Gamma, MD;  Location: [place];  Service: Neurosurgery;  Laterality: N/A;  ANTERIOR CERVICAL DECOMPRESSION/DISCECTOMY FUSION C3-C4, C4-C5  . BACK SURGERY    . BIOPSY THYROID    . [place], 2009   a. PTCA alone in 1994 b. RCA CTO w/ L-R collaterals, 40-50% prox RCA, 20% prox LAD; EF 50-55%, inferoapical HK  . CARDIAC CATHETERIZATION  04/02/2001   Continue medical therapy.  Marland Kitchen CARDIAC CATHETERIZATION  11/07/2000   Continue medical therapy.  Marland Kitchen CARDIAC CATHETERIZATION  09/24/1996   Continue medical therapy  . CARDIOVASCULAR STRESS TEST  04/06/2010   No scintigraphic evidence of inducible myocardial ischemia. No ECG changes. EKG negative for ischemia.  Marland Kitchen CARPAL TUNNEL RELEASE Left 10/09/2014   Procedure: LEFT CARPAL TUNNEL  RELEASE;  Surgeon: [name], MD;  Location: [place];  Service: Orthopedics;  Laterality: Left;  . COLONOSCOPY N/A 11/06/2014   RMR: Skipped Colonic ulcerations with significant ileocecal valve involvement and rectal sparing most consisitant with Crohns disease. Minimal non-steroidal drug use. Probable colonic lipoma  . CYSTECTOMY    . INCISION AND DRAINAGE ABSCESS Right 05/23/2012   Procedure: INCISION AND DRAINAGE ABSCESS;  Surgeon: [name], MD;  Location: [facility];  Service: General;  Laterality: Right;  . KIDNEY STONE  SURGERY  2004  . KNEE SURGERY    . LOWER EXTREMITY ARTERIAL DOPPLER  08/02/2007   Bilateral ABIs-no evidence of arterial insufficiency. Bilateral PVRs demonstrate normal waveforms.  Marland Kitchen NOSE SURGERY    . PILONIDAL CYST EXCISION    . SPLENECTOMY    . TRANSTHORACIC ECHOCARDIOGRAM  03/05/2009   EF >55%, normal LV wall thickness.  . WRIST SURGERY     carpal tunnel    Current Outpatient Prescriptions  Medication Sig Dispense Refill  . albuterol (PROVENTIL HFA;VENTOLIN HFA) 108 (90 BASE) MCG/ACT inhaler Inhale 1 puff into the lungs every 6 (six) hours as needed for wheezing or shortness of breath. Reported on 07/16/2015    . ALPRAZolam (XANAX) 0.5 MG tablet Take 1 mg by mouth at bedtime as needed for sleep.     Marland Kitchen amLODipine (NORVASC) 5 MG tablet Take 1 tablet (5 mg total) by mouth daily. 180 tablet 3  . butalbital-acetaminophen-caffeine (FIORICET, ESGIC) 50-325-40 MG tablet Take 1 tablet by mouth as needed for headache.     . citalopram (CELEXA) 10 MG tablet Take 5 mg by mouth daily.     . fluticasone (FLONASE) 50 MCG/ACT nasal spray Place 1 spray into both nostrils daily.    Marland Kitchen HYDROcodone-acetaminophen (NORCO/VICODIN) 5-325 MG tablet Take 1-2 tablets by mouth every 4 (four) hours as needed (mild pain). 40 tablet 0  . ipratropium (ATROVENT) 0.03 % nasal spray Place 2 sprays into both nostrils at bedtime.   5  . mesalamine (APRISO) 0.375 g 24 hr capsule Take 4 capsules (1.5 g total) by mouth daily. 120 capsule 11  . metoprolol succinate (TOPROL-XL) 50 MG 24 hr tablet Take 50 mg by mouth daily. Take with or immediately following a meal.    . nitroGLYCERIN (NITROSTAT) 0.4 MG SL tablet Place 0.4 mg under the tongue every 5 (five) minutes as needed for chest pain. Reported on 07/16/2015    . NON FORMULARY Bipap machine    . ramipril (ALTACE) 10 MG capsule TAKE 1 CAPSULE DAILY (Patient taking differently: TAKE 1 CAPSULE (10 mg) DAILY) 90 capsule 2  . VIAGRA 100 MG tablet Take 100 mg by mouth as needed for  erectile dysfunction.      No current facility-administered medications for this visit.     Allergies as of 12/10/2015 - Review Complete 12/04/2015  Allergen Reaction Noted  . Niacin and related Other (See Comments) 05/22/2012  . Penicillins Hives and Itching   . Statins  03/28/2013    Family History  Problem Relation Age of Onset  . CAD Father     CABG   . CAD Brother     CABG in 19s  . CAD Brother   . Arthritis/Rheumatoid Mother   . Hyperlipidemia Mother   . Thrombocytopenia Mother   . COPD Son     smoker  . Alzheimer's disease Maternal Grandmother   . Lung disease Paternal Grandfather     Social History   Social History  . Marital  status: Married    Spouse name: N/A  . Number of children: N/A  . Years of education: N/A   Social History Main Topics  . Smoking status: Former Smoker    Packs/day: 2.00    Years: 28.00    Types: Cigarettes    Quit date: 03/28/1990  . Smokeless tobacco: Former Systems developer    Types: Chew  . Alcohol use No  . Drug use: No  . Sexual activity: Yes   Other Topics Concern  . None   Social History Narrative   Lives in [place], [name]. Married, 2 children.     Review of Systems: General: Negative for anorexia, weight loss, fever, chills, weakness. ENT: Negative for hoarseness, difficulty swallowing . CV: Negative for chest pain, angina, palpitations, peripheral edema.  Respiratory: Negative for dyspnea at rest, cough, sputum, wheezing.  GI: See history of present illness. Endo: Negative for unusual weight change.  Heme: Negative for bruising or bleeding. Allergy: Negative for rash or hives.   Physical Exam: BP 119/73   Pulse 67   Temp 97 F (36.1 C) (Oral)   Ht 5\' 5"  (1.651 m)   Wt 226 lb 9.6 oz (102.8 kg)   BMI 37.71 kg/m  General:   Alert and oriented. Pleasant and cooperative. Well-nourished and well-developed.  Head:  Normocephalic and atraumatic. Eyes:  Without icterus, sclera clear and conjunctiva pink.  Ears:  Normal  auditory acuity. Cardiovascular:  S1, S2 present without murmurs appreciated. Normal pulses noted. Extremities without clubbing or edema. Respiratory:  Clear to auscultation bilaterally. No wheezes, rales, or rhonchi. No distress.  Gastrointestinal:  +BS, soft, non-tender and non-distended. No HSM noted. No guarding or rebound. No masses appreciated.  Rectal:  Deferred  Musculoskalatal:  Symmetrical without gross deformities. Neurologic:  Alert and oriented x4;  grossly normal neurologically. Psych:  Alert and cooperative. Normal mood and affect. Heme/Lymph/Immune: No excessive bruising noted.    12/10/2015 10:47 AM   Disclaimer: This note was dictated with voice recognition software. Similar sounding words can inadvertently be transcribed and may not be corrected upon review.

## 2015-12-10 NOTE — Telephone Encounter (Signed)
Called pt to inform of appt with Dr. Johney Maine at [facility] 12/21/15 at 11:45am. Pt was already aware.

## 2015-12-18 DIAGNOSIS — K50111 Crohn's disease of large intestine with rectal bleeding: Secondary | ICD-10-CM | POA: Diagnosis not present

## 2015-12-18 DIAGNOSIS — K604 Rectal fistula: Secondary | ICD-10-CM | POA: Diagnosis not present

## 2015-12-19 LAB — COMPREHENSIVE METABOLIC PANEL
ALT: 15 U/L (ref 9–46)
AST: 15 U/L (ref 10–35)
Albumin: 3.9 g/dL (ref 3.6–5.1)
Alkaline Phosphatase: 103 U/L (ref 40–115)
BUN: 9 mg/dL (ref 7–25)
CO2: 24 mmol/L (ref 20–31)
Calcium: 8.9 mg/dL (ref 8.6–10.3)
Chloride: 105 mmol/L (ref 98–110)
Creat: 0.72 mg/dL (ref 0.70–1.25)
Glucose, Bld: 89 mg/dL (ref 65–99)
Potassium: 4.2 mmol/L (ref 3.5–5.3)
Sodium: 140 mmol/L (ref 135–146)
Total Bilirubin: 0.5 mg/dL (ref 0.2–1.2)
Total Protein: 7.2 g/dL (ref 6.1–8.1)

## 2015-12-19 LAB — CBC WITH DIFFERENTIAL/PLATELET
Basophils Absolute: 0 cells/uL (ref 0–200)
Basophils Relative: 0 %
Eosinophils Absolute: 128 cells/uL (ref 15–500)
Eosinophils Relative: 2 %
HCT: 43.1 % (ref 38.5–50.0)
Hemoglobin: 15 g/dL (ref 13.2–17.1)
Lymphocytes Relative: 28 %
Lymphs Abs: 1792 cells/uL (ref 850–3900)
MCH: 30.5 pg (ref 27.0–33.0)
MCHC: 34.8 g/dL (ref 32.0–36.0)
MCV: 87.8 fL (ref 80.0–100.0)
MPV: 10.8 fL (ref 7.5–12.5)
Monocytes Absolute: 896 cells/uL (ref 200–950)
Monocytes Relative: 14 %
Neutro Abs: 3584 cells/uL (ref 1500–7800)
Neutrophils Relative %: 56 %
Platelets: 362 10*3/uL (ref 140–400)
RBC: 4.91 MIL/uL (ref 4.20–5.80)
RDW: 14.5 % (ref 11.0–15.0)
WBC: 6.4 10*3/uL (ref 3.8–10.8)

## 2015-12-21 ENCOUNTER — Ambulatory Visit: Payer: Self-pay | Admitting: Surgery

## 2015-12-21 DIAGNOSIS — K603 Anal fistula: Secondary | ICD-10-CM | POA: Diagnosis not present

## 2015-12-21 DIAGNOSIS — Z01818 Encounter for other preprocedural examination: Secondary | ICD-10-CM | POA: Diagnosis not present

## 2015-12-21 DIAGNOSIS — K50111 Crohn's disease of large intestine with rectal bleeding: Secondary | ICD-10-CM | POA: Diagnosis not present

## 2015-12-21 DIAGNOSIS — K602 Anal fissure, unspecified: Secondary | ICD-10-CM | POA: Diagnosis not present

## 2015-12-21 LAB — C-REACTIVE PROTEIN: CRP: 7.5 mg/L (ref <8.0)

## 2015-12-22 ENCOUNTER — Other Ambulatory Visit: Payer: Self-pay | Admitting: Surgery

## 2015-12-22 DIAGNOSIS — K50111 Crohn's disease of large intestine with rectal bleeding: Secondary | ICD-10-CM

## 2015-12-28 ENCOUNTER — Ambulatory Visit: Payer: Medicare Other | Admitting: Cardiovascular Disease

## 2016-01-04 ENCOUNTER — Other Ambulatory Visit: Payer: Medicare Other

## 2016-01-04 ENCOUNTER — Ambulatory Visit
Admission: RE | Admit: 2016-01-04 | Discharge: 2016-01-04 | Disposition: A | Discharge status: Home / Self Care | Payer: Medicare Other | Source: Ambulatory Visit | Attending: Surgery | Admitting: Surgery

## 2016-01-04 DIAGNOSIS — K50111 Crohn's disease of large intestine with rectal bleeding: Secondary | ICD-10-CM

## 2016-01-04 DIAGNOSIS — K625 Hemorrhage of anus and rectum: Secondary | ICD-10-CM | POA: Diagnosis not present

## 2016-02-05 DIAGNOSIS — M5030 Other cervical disc degeneration, unspecified cervical region: Secondary | ICD-10-CM | POA: Diagnosis not present

## 2016-02-05 DIAGNOSIS — I10 Essential (primary) hypertension: Secondary | ICD-10-CM | POA: Diagnosis not present

## 2016-02-05 DIAGNOSIS — M4712 Other spondylosis with myelopathy, cervical region: Secondary | ICD-10-CM | POA: Diagnosis not present

## 2016-02-05 DIAGNOSIS — M5000 Cervical disc disorder with myelopathy, unspecified cervical region: Secondary | ICD-10-CM | POA: Diagnosis not present

## 2016-02-05 DIAGNOSIS — Z6834 Body mass index (BMI) 34.0-34.9, adult: Secondary | ICD-10-CM | POA: Diagnosis not present

## 2016-02-05 DIAGNOSIS — Z981 Arthrodesis status: Secondary | ICD-10-CM | POA: Diagnosis not present

## 2016-02-10 DIAGNOSIS — M4802 Spinal stenosis, cervical region: Secondary | ICD-10-CM | POA: Diagnosis not present

## 2016-02-10 DIAGNOSIS — M4712 Other spondylosis with myelopathy, cervical region: Secondary | ICD-10-CM | POA: Diagnosis not present

## 2016-02-17 DIAGNOSIS — Z981 Arthrodesis status: Secondary | ICD-10-CM | POA: Diagnosis not present

## 2016-02-17 DIAGNOSIS — M4712 Other spondylosis with myelopathy, cervical region: Secondary | ICD-10-CM | POA: Diagnosis not present

## 2016-02-17 DIAGNOSIS — M5000 Cervical disc disorder with myelopathy, unspecified cervical region: Secondary | ICD-10-CM | POA: Diagnosis not present

## 2016-02-17 DIAGNOSIS — M5030 Other cervical disc degeneration, unspecified cervical region: Secondary | ICD-10-CM | POA: Diagnosis not present

## 2016-02-26 ENCOUNTER — Other Ambulatory Visit: Payer: Self-pay | Admitting: Surgery

## 2016-02-26 DIAGNOSIS — K642 Third degree hemorrhoids: Secondary | ICD-10-CM | POA: Diagnosis not present

## 2016-02-26 DIAGNOSIS — K601 Chronic anal fissure: Secondary | ICD-10-CM | POA: Diagnosis not present

## 2016-02-26 DIAGNOSIS — K604 Rectal fistula: Secondary | ICD-10-CM | POA: Diagnosis not present

## 2016-02-26 DIAGNOSIS — K603 Anal fistula: Secondary | ICD-10-CM | POA: Diagnosis not present

## 2016-02-26 DIAGNOSIS — K6289 Other specified diseases of anus and rectum: Secondary | ICD-10-CM | POA: Diagnosis not present

## 2016-03-22 DIAGNOSIS — E6609 Other obesity due to excess calories: Secondary | ICD-10-CM | POA: Diagnosis not present

## 2016-03-22 DIAGNOSIS — J329 Chronic sinusitis, unspecified: Secondary | ICD-10-CM | POA: Diagnosis not present

## 2016-03-22 DIAGNOSIS — E119 Type 2 diabetes mellitus without complications: Secondary | ICD-10-CM | POA: Diagnosis not present

## 2016-03-22 DIAGNOSIS — I10 Essential (primary) hypertension: Secondary | ICD-10-CM | POA: Diagnosis not present

## 2016-03-22 DIAGNOSIS — M1991 Primary osteoarthritis, unspecified site: Secondary | ICD-10-CM | POA: Diagnosis not present

## 2016-03-22 DIAGNOSIS — K5090 Crohn's disease, unspecified, without complications: Secondary | ICD-10-CM | POA: Diagnosis not present

## 2016-03-22 DIAGNOSIS — E669 Obesity, unspecified: Secondary | ICD-10-CM | POA: Diagnosis not present

## 2016-03-22 DIAGNOSIS — Z6834 Body mass index (BMI) 34.0-34.9, adult: Secondary | ICD-10-CM | POA: Diagnosis not present

## 2016-03-22 DIAGNOSIS — Z1389 Encounter for screening for other disorder: Secondary | ICD-10-CM | POA: Diagnosis not present

## 2016-03-30 ENCOUNTER — Other Ambulatory Visit: Payer: Self-pay | Admitting: Internal Medicine

## 2016-04-03 ENCOUNTER — Telehealth: Payer: Self-pay | Admitting: Internal Medicine

## 2016-04-03 NOTE — Telephone Encounter (Signed)
Spoke to Dr. Johney Maine last week about pt.  He has had surgery for fistulizing Crohns.  Dr. Johney Maine wondering about a more optimal medical regimen to facilitate healing and decrease the chances of recurrence.  Pt on off label mesalamine for inrtraluminal Crohns and seems to be doing with this aspect of his disease.  However, he really needs to be on a biologic such as Remicaide which has good efficacy here.  I have repeatedly tried to reach patient today via phone to discuss.  No contact.  I'll arrange an OV in the near future to explore options further.

## 2016-04-05 NOTE — Telephone Encounter (Signed)
Patient scheduled for future appointment

## 2016-04-14 ENCOUNTER — Ambulatory Visit (INDEPENDENT_AMBULATORY_CARE_PROVIDER_SITE_OTHER): Payer: Medicare Other | Admitting: Otolaryngology

## 2016-04-14 ENCOUNTER — Other Ambulatory Visit (INDEPENDENT_AMBULATORY_CARE_PROVIDER_SITE_OTHER): Payer: Self-pay | Admitting: Otolaryngology

## 2016-04-14 DIAGNOSIS — R490 Dysphonia: Secondary | ICD-10-CM

## 2016-04-14 DIAGNOSIS — H6123 Impacted cerumen, bilateral: Secondary | ICD-10-CM | POA: Diagnosis not present

## 2016-04-14 DIAGNOSIS — H903 Sensorineural hearing loss, bilateral: Secondary | ICD-10-CM | POA: Diagnosis not present

## 2016-04-14 DIAGNOSIS — R1310 Dysphagia, unspecified: Secondary | ICD-10-CM

## 2016-04-20 ENCOUNTER — Ambulatory Visit (HOSPITAL_COMMUNITY)
Admission: RE | Admit: 2016-04-20 | Discharge: 2016-04-20 | Disposition: A | Discharge status: Home / Self Care | Payer: Medicare Other | Source: Ambulatory Visit | Attending: Otolaryngology | Admitting: Otolaryngology

## 2016-04-20 DIAGNOSIS — K449 Diaphragmatic hernia without obstruction or gangrene: Secondary | ICD-10-CM | POA: Insufficient documentation

## 2016-04-20 DIAGNOSIS — R1310 Dysphagia, unspecified: Secondary | ICD-10-CM

## 2016-04-20 DIAGNOSIS — K224 Dyskinesia of esophagus: Secondary | ICD-10-CM | POA: Diagnosis not present

## 2016-04-26 ENCOUNTER — Ambulatory Visit (INDEPENDENT_AMBULATORY_CARE_PROVIDER_SITE_OTHER): Payer: Medicare Other | Admitting: Internal Medicine

## 2016-04-26 ENCOUNTER — Encounter: Payer: Self-pay | Admitting: Internal Medicine

## 2016-04-26 VITALS — BP 132/82 | HR 78 | Temp 97.8°F | Ht 68.0 in | Wt 220.0 lb

## 2016-04-26 DIAGNOSIS — K50113 Crohn's disease of large intestine with fistula: Secondary | ICD-10-CM | POA: Diagnosis not present

## 2016-04-26 NOTE — Progress Notes (Signed)
Primary Care Physician:  [name]., MD Primary Gastroenterologist:  [name]  Pre-Procedure History & Physical: HPI:  [name] is a 66 y.o. male here for follow-up of Crohn's colitis complicated by fistula formation. Has seen Drs. Arnoldo Morale and Gross previously. He's undergone multiple procedures by [name] and one procedure thus far by Dr. Johney Maine. Dr. Johney Maine and I discussed the case recently via telephone Pt describes somewhat mucopurulent bloody discharge from his fistula after surgery. In fact, I noted he alternates putting pressure on his left and right buttock sitting in the exam room during the interview because of discomfort.  He tells me he has not had any abdominal pain has 1-3 bowel movements daily. Denies diarrhea. Denies blood with the stool. Continues on Apriso as off label treatment for Crohn's colitis. He tells me he was told to take Advil for pain.  He reports seeing a Administrator and is taking psyllium husk and probiotic therapy.  Had a lengthy discussion about the challenges of treating and healing fistula arising in Crohn's disease. His current pharmacologic regimen lacks efficacy in this setting. I explained to him that he really needs to be on another class of medication either aThiopurine or, ideally, a biologic like Remicade. We briefly discussed the risk and benefits and the indefinite nature of treatment.    Past Medical History:  Diagnosis Date  . Anginal pain ([place])   . Anxiety   . Arthritis   . CAD (coronary artery disease)    a. Mid RCA CTO w/ L-R collateralization, nonobstructive dz elsewhere by 2009 cath  . Complication of anesthesia    heart rate dropped twice during surg  . Depression   . GERD (gastroesophageal reflux disease)   . Headache   . Hepatitis    in the early 70's not sure what kind  . History of tobacco abuse   . Hyperlipidemia    a. statin, niacin intolerant  . Hypertension   . Myocardial infarct    a. MI in 1994 s/p PTCA  alone  . Nephrolithiasis   . Obesity   . Sleep apnea    does not tol BIPAP or CPAP    Past Surgical History:  Procedure Laterality Date  . ANAL FISTULOTOMY N/A 06/28/2013   Procedure: ANAL FISTULOTOMY;  Surgeon: [name], MD;  Location: [facility];  Service: General;  Laterality: N/A;  . ANTERIOR CERVICAL DECOMP/DISCECTOMY FUSION N/A 11/12/2015   Procedure: ANTERIOR CERVICAL DECOMPRESSION/DISCECTOMY FUSION CERVICAL THREE- CERVICAL FOUR, CERVICAL FOUR- CERVICAL FIVE;  Surgeon: Jovita Gamma, MD;  Location: [place];  Service: Neurosurgery;  Laterality: N/A;  ANTERIOR CERVICAL DECOMPRESSION/DISCECTOMY FUSION C3-C4, C4-C5  . BACK SURGERY    . BIOPSY THYROID    . [place], 2009   a. PTCA alone in 1994 b. RCA CTO w/ L-R collaterals, 40-50% prox RCA, 20% prox LAD; EF 50-55%, inferoapical HK  . CARDIAC CATHETERIZATION  04/02/2001   Continue medical therapy.  Marland Kitchen CARDIAC CATHETERIZATION  11/07/2000   Continue medical therapy.  Marland Kitchen CARDIAC CATHETERIZATION  09/24/1996   Continue medical therapy  . CARDIOVASCULAR STRESS TEST  04/06/2010   No scintigraphic evidence of inducible myocardial ischemia. No ECG changes. EKG negative for ischemia.  Marland Kitchen CARPAL TUNNEL RELEASE Left 10/09/2014   Procedure: LEFT CARPAL TUNNEL RELEASE;  Surgeon: [name], MD;  Location: [place];  Service: Orthopedics;  Laterality: Left;  . COLONOSCOPY N/A 11/06/2014   RMR: Skipped Colonic ulcerations with significant ileocecal valve involvement and  rectal sparing most consisitant with Crohns disease. Minimal non-steroidal drug use. Probable colonic lipoma  . CYSTECTOMY    . INCISION AND DRAINAGE ABSCESS Right 05/23/2012   Procedure: INCISION AND DRAINAGE ABSCESS;  Surgeon: [name], MD;  Location: [facility];  Service: General;  Laterality: Right;  . KIDNEY STONE SURGERY  2004  . KNEE SURGERY    . LOWER EXTREMITY ARTERIAL DOPPLER  08/02/2007   Bilateral ABIs-no evidence of arterial  insufficiency. Bilateral PVRs demonstrate normal waveforms.  Marland Kitchen NOSE SURGERY    . PILONIDAL CYST EXCISION    . SPLENECTOMY    . TRANSTHORACIC ECHOCARDIOGRAM  03/05/2009   EF >55%, normal LV wall thickness.  . WRIST SURGERY     carpal tunnel    Prior to Admission medications   Medication Sig Start Date End Date Taking? Authorizing Provider  albuterol (PROVENTIL HFA;VENTOLIN HFA) 108 (90 BASE) MCG/ACT inhaler Inhale 1 puff into the lungs every 6 (six) hours as needed for wheezing or shortness of breath. Reported on 07/16/2015   Yes Historical Provider, MD  ALPRAZolam Duanne Moron) 0.5 MG tablet Take 1 mg by mouth at bedtime as needed for sleep.    Yes Historical Provider, MD  amLODipine (NORVASC) 5 MG tablet Take 1 tablet (5 mg total) by mouth daily. 03/28/13  Yes Troy Sine, MD  APRISO 0.375 g 24 hr capsule TAKE 4 CAPSULES(1.5 GRAMS) BY MOUTH DAILY 03/30/16  Yes Annitta Needs, NP  butalbital-acetaminophen-caffeine (FIORICET, ESGIC) [phone] MG tablet Take 1 tablet by mouth as needed for headache.    Yes Historical Provider, MD  citalopram (CELEXA) 10 MG tablet Take 5 mg by mouth daily.    Yes Historical Provider, MD  fluticasone (FLONASE) 50 MCG/ACT nasal spray Place 1 spray into both nostrils daily. 09/09/15  Yes Historical Provider, MD  ipratropium (ATROVENT) 0.03 % nasal spray Place 2 sprays into both nostrils at bedtime.  09/10/15  Yes Historical Provider, MD  metoprolol succinate (TOPROL-XL) 50 MG 24 hr tablet Take 50 mg by mouth daily. Take with or immediately following a meal.   Yes Historical Provider, MD  nitroGLYCERIN (NITROSTAT) 0.4 MG SL tablet Place 0.4 mg under the tongue every 5 (five) minutes as needed for chest pain. Reported on 07/16/2015   Yes Historical Provider, MD  NON FORMULARY Bipap machine   Yes Historical Provider, MD  ramipril (ALTACE) 10 MG capsule TAKE 1 CAPSULE DAILY Patient taking differently: TAKE 1 CAPSULE (10 mg) DAILY 09/11/13  Yes Lorretta Harp, MD  VIAGRA 100 MG tablet  Take 100 mg by mouth as needed for erectile dysfunction.  03/10/14  Yes Historical Provider, MD  HYDROcodone-acetaminophen (NORCO/VICODIN) 5-325 MG tablet Take 1-2 tablets by mouth every 4 (four) hours as needed (mild pain). Patient not taking: Reported on 04/26/2016 11/13/15   Jovita Gamma, MD    Allergies as of 04/26/2016 - Review Complete 04/26/2016  Allergen Reaction Noted  . Niacin and related Other (See Comments) 05/22/2012  . Penicillins Hives and Itching   . Statins  03/28/2013    Family History  Problem Relation Age of Onset  . CAD Father     CABG   . CAD Brother     CABG in 29s  . CAD Brother   . Arthritis/Rheumatoid Mother   . Hyperlipidemia Mother   . Thrombocytopenia Mother   . COPD Son     smoker  . Alzheimer's disease Maternal Grandmother   . Lung disease Paternal Grandfather     Social History  Social History  . Marital status: Married    Spouse name: N/A  . Number of children: N/A  . Years of education: N/A   Occupational History  . Not on file.   Social History Main Topics  . Smoking status: Former Smoker    Packs/day: 2.00    Years: 28.00    Types: Cigarettes    Quit date: 03/28/1990  . Smokeless tobacco: Former Systems developer    Types: Chew  . Alcohol use No  . Drug use: No  . Sexual activity: Yes   Other Topics Concern  . Not on file   Social History Narrative   Lives in [place], [name]. Married, 2 children.     Review of Systems: See HPI, otherwise negative ROS  Physical Exam: BP 132/82   Pulse 78   Temp 97.8 F (36.6 C) (Oral)   Ht 5\' 8"  (1.727 m)   Wt 220 lb (99.8 kg)   BMI 33.45 kg/m  General:   Alert,   pleasant and cooperative in NAD Neck:  Supple; no masses or thyromegaly. No significant cervical adenopathy. Lungs:  Clear throughout to auscultation.   No wheezes, crackles, or rhonchi. No acute distress. Heart:  Regular rate and rhythm; no murmurs, clicks, rubs,  or gallops. Abdomen: Positive bowel sounds. Soft and nontender  without appreciable mass or organomegaly.  Pulses:  Normal pulses noted. Extremities:  Without clubbing or edema. Examination of the anorectal area demonstrated surgical changes an apparent fistula opening. No fluctuance or unusual redness seen. Mild tenderness to palpation. Digital exam not done  Impression:  Pleasant 66 year old gentleman with well-established Crohn's colitis, complicated by fistulized disease. He has undergone multiple surgical procedures with less than ideal results up to this point in time. He really needs to be on pharmacologic therapy directed at fistulizing disease i.e. biologic therapy. I discussed the risks and benefits.  Mr. Elgie Congo has very strong ideas regarding taking long-term medications including Biologics. He states that he is not likely to ever take a biologic. Moreover, he is not interested in thiiopurines as well.   He is agreeable to review information on biologic therapy.  I explained to him that aspirin and any other form of nonsteroidal agent may cause a flare in his intraluminal Crohn's disease.  Recommendations:  Continue Apriso as off label treatment of Crohn's colitis  Avoid all ASA/NSAIDs   May continue psyllium husk and probiotic  As discussed, patient not interested in biologic therapy for Crohns at this time, we will give information on Remicaide  We will look for less expensive alternatives to off label Apriso as discussed  Office visit in 4 months  Keep follow-up appointments with Dr. Johney Maine.       Notice: This dictation was prepared with Dragon dictation along with smaller phrase technology. Any transcriptional errors that result from this process are unintentional and may not be corrected upon review.

## 2016-04-26 NOTE — Patient Instructions (Signed)
Continue Apriso as off label treatment of Crohn's colitis  Avoid all ASA/NSAIDs   May continue psyllium husk and probiotic  As discussed, you are not interested in biologic therapy for Crohns at this time, we will give information on Remicaide  We will look for less expensive alternatives to off label Apriso as discussed  Office visit in 4 months

## 2016-05-04 ENCOUNTER — Telehealth: Payer: Self-pay

## 2016-05-04 NOTE — Telephone Encounter (Signed)
Communication noted.  Thanks. 

## 2016-05-04 NOTE — Telephone Encounter (Signed)
Spoke with the pts wife- pt has [employer] rx coverage. His apriso is a tier 3 and the first bottle cost him $400. It will cost him $31 a month until he is in the doughnut hole and then his price will go back up. I have checked the formulary and there are not any other medications listed at tier 3.  Sulfasalazine is tier 2 and balsalazide and mesalamine kit is tier 4.  The only thing we can do at this point is wait until pt is in the doughnut hole and then have him apply for patient assistance at that time, unless there is something else RMR would like to try.

## 2016-05-09 NOTE — Telephone Encounter (Signed)
I have sent in a tier exception to see if it will be approved. Waiting for insurance response.

## 2016-05-10 ENCOUNTER — Telehealth: Payer: Self-pay | Admitting: Internal Medicine

## 2016-05-10 NOTE — Telephone Encounter (Signed)
Tier exception was denied. I tried to call pt- NA-LMOM to return call.

## 2016-05-10 NOTE — Telephone Encounter (Signed)
Spoke with the pt, see other phone note.  

## 2016-05-10 NOTE — Telephone Encounter (Signed)
Pt's wife, Izora Gala, was returning a call from [place]. Please call them back at [phone]

## 2016-05-10 NOTE — Telephone Encounter (Signed)
Spoke with the pts wife- explained everything to her and she stated she understood. She asked that I mailed the pt assistance forms to her now so she will have them when she needs them. I have mailed them to her with instructions.

## 2016-05-18 DIAGNOSIS — H25011 Cortical age-related cataract, right eye: Secondary | ICD-10-CM | POA: Diagnosis not present

## 2016-05-18 DIAGNOSIS — H5203 Hypermetropia, bilateral: Secondary | ICD-10-CM | POA: Diagnosis not present

## 2016-05-18 DIAGNOSIS — H524 Presbyopia: Secondary | ICD-10-CM | POA: Diagnosis not present

## 2016-05-19 ENCOUNTER — Ambulatory Visit (INDEPENDENT_AMBULATORY_CARE_PROVIDER_SITE_OTHER): Payer: Medicare Other | Admitting: Otolaryngology

## 2016-05-19 DIAGNOSIS — R1312 Dysphagia, oropharyngeal phase: Secondary | ICD-10-CM

## 2016-05-19 DIAGNOSIS — K219 Gastro-esophageal reflux disease without esophagitis: Secondary | ICD-10-CM

## 2016-05-19 DIAGNOSIS — R490 Dysphonia: Secondary | ICD-10-CM

## 2016-05-26 ENCOUNTER — Other Ambulatory Visit: Payer: Self-pay | Admitting: Gastroenterology

## 2016-06-06 DIAGNOSIS — K602 Anal fissure, unspecified: Secondary | ICD-10-CM | POA: Diagnosis not present

## 2016-06-06 DIAGNOSIS — K50111 Crohn's disease of large intestine with rectal bleeding: Secondary | ICD-10-CM | POA: Diagnosis not present

## 2016-06-06 DIAGNOSIS — K603 Anal fistula: Secondary | ICD-10-CM | POA: Diagnosis not present

## 2016-06-08 ENCOUNTER — Ambulatory Visit: Payer: Medicare Other | Admitting: Nurse Practitioner

## 2016-06-17 DIAGNOSIS — M5030 Other cervical disc degeneration, unspecified cervical region: Secondary | ICD-10-CM | POA: Diagnosis not present

## 2016-06-17 DIAGNOSIS — M7502 Adhesive capsulitis of left shoulder: Secondary | ICD-10-CM | POA: Diagnosis not present

## 2016-06-17 DIAGNOSIS — Z981 Arthrodesis status: Secondary | ICD-10-CM | POA: Diagnosis not present

## 2016-06-17 DIAGNOSIS — M4712 Other spondylosis with myelopathy, cervical region: Secondary | ICD-10-CM | POA: Diagnosis not present

## 2016-06-28 DIAGNOSIS — M25512 Pain in left shoulder: Secondary | ICD-10-CM | POA: Diagnosis not present

## 2016-07-06 ENCOUNTER — Encounter: Payer: Self-pay | Admitting: Cardiovascular Disease

## 2016-07-06 ENCOUNTER — Ambulatory Visit (INDEPENDENT_AMBULATORY_CARE_PROVIDER_SITE_OTHER): Payer: Medicare Other | Admitting: Cardiovascular Disease

## 2016-07-06 VITALS — BP 122/78 | HR 65 | Ht 69.0 in | Wt 223.0 lb

## 2016-07-06 DIAGNOSIS — E785 Hyperlipidemia, unspecified: Secondary | ICD-10-CM | POA: Diagnosis not present

## 2016-07-06 DIAGNOSIS — Z789 Other specified health status: Secondary | ICD-10-CM

## 2016-07-06 DIAGNOSIS — I2510 Atherosclerotic heart disease of native coronary artery without angina pectoris: Secondary | ICD-10-CM

## 2016-07-06 DIAGNOSIS — I10 Essential (primary) hypertension: Secondary | ICD-10-CM | POA: Diagnosis not present

## 2016-07-06 DIAGNOSIS — G4733 Obstructive sleep apnea (adult) (pediatric): Secondary | ICD-10-CM | POA: Diagnosis not present

## 2016-07-06 DIAGNOSIS — K50111 Crohn's disease of large intestine with rectal bleeding: Secondary | ICD-10-CM | POA: Diagnosis not present

## 2016-07-06 NOTE — Patient Instructions (Signed)
Your physician wants you to follow-up in: 6 months or sooner if needed. You will receive a reminder letter in the mail two months in advance. If you don't receive a letter, please call our office to schedule the follow-up appointment.   If you need a refill on your cardiac medications before your next appointment, please call your pharmacy. 

## 2016-07-06 NOTE — Progress Notes (Signed)
Patient ID: [name], male   DOB: May 14, 1950, 66 y.o.   MRN: [id number]     HPI: [name] is a 66 y.o. male who is a former patient of Dr. Rollene Fare who presents for a 7 month follow-up cardiology/sleep evaluation.  [name] has CAD and suffered an inferior wall MI in 1994.  Cardiac catheterization revealed RCA occlusion with left-to-right collaterals. He was treated initially with PTCA of his RCA. His last cardiac catheterization was in 2009 which showed an RCA occlusion with collaterals with no significant LAD disease, although there was mild 20% diagonal stenosis.  An echo Doppler study done in August 2014 by Dr. Rollene Fare showed mild LVH with mild hypokinesis of the inferolateral wall but with preserved global systolic function with an EF of 55-60%. He had normal diastolic parameters.  In January 2015 he was admitted by the hospitalist service with chest pain. A stress Myoview study did not demonstrate convincing inducible myocardial ischemia. There was limited evaluation of the inferior wall due to movement and overlap of the diaphragm and activity within the left lobe of the liver. Ejection fraction was 47%. He was seen by Dr. Rayann Heman for cardiology consultation who recommended no further cardiac workup.  Lipid evaluation reveals a total cholesterol 163 triglycerides 100 HDL 31 LDL 112 in the past, he had some difficulty taking statins.  Additional problems include hypertension, obesity, and arthritis. He has a remote history of right buttock abscess leading to hospitalization in December 2013.  When I initially saw him, I discontinued his gemfibrozil and recommended a trial of Livalo to take and hopefully tolerate  along with coenzyme Q10.  I titrated his amlodipine to 5 mg daily for more optimal blood pressure control.  I was also very concerned about obstructive sleep apnea and referred him for a sleep study.  Due to my concerns for obstructive sleep apnea, he underwent a split-night  protocol, on 05/10/2012.  This revealed severe sleep apnea with an AHI of 75.6 per hour.  During REM sleep this was 40 per hour.  He had significant oxygen desaturation to 78% with non-REM sleep and 76% with REM sleep.  He also had a significant positional component to his sleep apnea.  He was started on CPAP titration but due to high-pressure he was was switched to a BiPAP mode.  Ultimately, a BiPAP auto unit was recommended with an EPAP minimum of 10 and a IPAP maximum up to 25.  He also did have periodic lid movements with sleep with an index of 18.  He apparently initially used BiPAP for only 3 weeks but was having difficulty with his mass.  However, before you in being seen in follow-up evaluation.  He returned his BiPAP unit due to concerns that he was not compliant and would have to pay for this without insurance.  Since I last saw him, sleep was very poor, he was unable to sleep on his back, and snored loudly.  I referred him for a follow-up sleep study which was done on 08/10/2015.  This confirms severe sleep apnea with an AHI of 95.7.  He ultimately underwent CPAP and then BiPAP titration and was titrated up to 17/13.  A BiPAP auto therapy was recommended with an EPAP minimum of 10 and IPAP maximum of 25.  He received his new BiPAP machine on 09/05/2015 and is compliant until the very beginning of October.  I reviewed his ResMed compliance data, which documents compliance, both for usage stays as well as  usage greater than 4 hours.  However, on 11/12/2015.  He underwent cervical neck surgery by Dr. Lucia Gaskins and as result is having to wear a stiff neck collar.  He has not been able to use his CPAP.  While wearing the neck collar due to the inability for his mask to sit appropriately.  Of note, when I did review his download, he has significant mask leak.  His DME company is [place].  He was diagnosed with Crohn's disease following a recent colonoscopy.  He has had issues with anal fissures and  fistula.  Since I last saw him, he underwent surgery for an anal fistula repair in January 2018.  He tolerated that well from a cardiovascular standpoint.  He denies any episodes of chest pain or palpitations.  He continues to use CPAP but has been having difficulty with his full face mask.  I obtained a download in the office from 06/06/2016 through 07/05/2016.  He is only been using this for 3 hours and 33 minutes per night.  He has an ResMed AirCurve 10 Auto BiPAP  unit with maximum average IPAP pressure at 17 EPAP pressure of 13.  He has a significant mask leak essentially every day and AHI despite the leak was 4.4.  [facility] is his DME company.  He presents for evaluation.   Past Medical History:  Diagnosis Date  . Anginal pain ([place])   . Anxiety   . Arthritis   . CAD (coronary artery disease)    a. Mid RCA CTO w/ L-R collateralization, nonobstructive dz elsewhere by 2009 cath  . Complication of anesthesia    heart rate dropped twice during surg  . Depression   . GERD (gastroesophageal reflux disease)   . Headache   . Hepatitis    in the early 70's not sure what kind  . History of tobacco abuse   . Hyperlipidemia    a. statin, niacin intolerant  . Hypertension   . Myocardial infarct [facility])    a. MI in 1994 s/p PTCA alone  . Nephrolithiasis   . Obesity   . Sleep apnea    does not tol BIPAP or CPAP    Past Surgical History:  Procedure Laterality Date  . ANAL FISTULOTOMY N/A 06/28/2013   Procedure: ANAL FISTULOTOMY;  Surgeon: [name], MD;  Location: [facility];  Service: General;  Laterality: N/A;  . ANTERIOR CERVICAL DECOMP/DISCECTOMY FUSION N/A 11/12/2015   Procedure: ANTERIOR CERVICAL DECOMPRESSION/DISCECTOMY FUSION CERVICAL THREE- CERVICAL FOUR, CERVICAL FOUR- CERVICAL FIVE;  Surgeon: Jovita Gamma, MD;  Location: [place];  Service: Neurosurgery;  Laterality: N/A;  ANTERIOR CERVICAL DECOMPRESSION/DISCECTOMY FUSION C3-C4, C4-C5  . BACK SURGERY    . BIOPSY THYROID    .  [place], 2009   a. PTCA alone in 1994 b. RCA CTO w/ L-R collaterals, 40-50% prox RCA, 20% prox LAD; EF 50-55%, inferoapical HK  . CARDIAC CATHETERIZATION  04/02/2001   Continue medical therapy.  Marland Kitchen CARDIAC CATHETERIZATION  11/07/2000   Continue medical therapy.  Marland Kitchen CARDIAC CATHETERIZATION  09/24/1996   Continue medical therapy  . CARDIOVASCULAR STRESS TEST  04/06/2010   No scintigraphic evidence of inducible myocardial ischemia. No ECG changes. EKG negative for ischemia.  Marland Kitchen CARPAL TUNNEL RELEASE Left 10/09/2014   Procedure: LEFT CARPAL TUNNEL RELEASE;  Surgeon: [name], MD;  Location: [place];  Service: Orthopedics;  Laterality: Left;  . COLONOSCOPY N/A 11/06/2014   RMR: Skipped Colonic ulcerations with significant ileocecal  valve involvement and rectal sparing most consisitant with Crohns disease. Minimal non-steroidal drug use. Probable colonic lipoma  . CYSTECTOMY    . INCISION AND DRAINAGE ABSCESS Right 05/23/2012   Procedure: INCISION AND DRAINAGE ABSCESS;  Surgeon: [name], MD;  Location: [facility];  Service: General;  Laterality: Right;  . KIDNEY STONE SURGERY  2004  . KNEE SURGERY    . LOWER EXTREMITY ARTERIAL DOPPLER  08/02/2007   Bilateral ABIs-no evidence of arterial insufficiency. Bilateral PVRs demonstrate normal waveforms.  Marland Kitchen NOSE SURGERY    . PILONIDAL CYST EXCISION    . SPLENECTOMY    . TRANSTHORACIC ECHOCARDIOGRAM  03/05/2009   EF >55%, normal LV wall thickness.  . WRIST SURGERY     carpal tunnel    Allergies  Allergen Reactions  . Niacin And Related Other (See Comments)    flush  . Penicillins Hives and Itching    Has patient had a PCN reaction causing immediate rash, facial/tongue/throat swelling, SOB or lightheadedness with hypotension: Yes Has patient had a PCN reaction causing severe rash involving mucus membranes or skin necrosis: No Has patient had a PCN reaction that required hospitalization No Has patient had a PCN  reaction occurring within the last 10 years: No If all of the above answers are "NO", then may proceed with Cephalosporin use.   . Statins     myalgias    Current Outpatient Prescriptions  Medication Sig Dispense Refill  . albuterol (PROVENTIL HFA;VENTOLIN HFA) 108 (90 BASE) MCG/ACT inhaler Inhale 1 puff into the lungs every 6 (six) hours as needed for wheezing or shortness of breath. Reported on 07/16/2015    . ALPRAZolam (XANAX) 0.5 MG tablet Take 1 mg by mouth at bedtime as needed for sleep.     Marland Kitchen amLODipine (NORVASC) 5 MG tablet Take 1 tablet (5 mg total) by mouth daily. 180 tablet 3  . APRISO 0.375 g 24 hr capsule TAKE 4 CAPSULES(1.5 GRAMS) BY MOUTH DAILY 120 capsule 3  . butalbital-acetaminophen-caffeine (FIORICET, ESGIC) 50-325-40 MG tablet Take 1 tablet by mouth as needed for headache.     . fluticasone (FLONASE) 50 MCG/ACT nasal spray Place 1 spray into both nostrils daily.    Marland Kitchen ipratropium (ATROVENT) 0.03 % nasal spray Place 2 sprays into both nostrils at bedtime.   5  . metoprolol succinate (TOPROL-XL) 50 MG 24 hr tablet Take 50 mg by mouth daily. Take with or immediately following a meal.    . nitroGLYCERIN (NITROSTAT) 0.4 MG SL tablet Place 0.4 mg under the tongue every 5 (five) minutes as needed for chest pain. Reported on 07/16/2015    . NON FORMULARY Bipap machine    . ramipril (ALTACE) 10 MG capsule Take 10 mg by mouth daily.     No current facility-administered medications for this visit.     Social History   Social History  . Marital status: Married    Spouse name: N/A  . Number of children: N/A  . Years of education: N/A   Occupational History  . Not on file.   Social History Main Topics  . Smoking status: Former Smoker    Packs/day: 2.00    Years: 28.00    Types: Cigarettes    Quit date: 03/28/1990  . Smokeless tobacco: Former Systems developer    Types: Chew  . Alcohol use No  . Drug use: No  . Sexual activity: Yes   Other Topics Concern  . Not on file   Social  History Narrative  Lives in [place], [name]. Married, 2 children.     Family History  Problem Relation Age of Onset  . CAD Father        CABG   . CAD Brother        CABG in 96s  . CAD Brother   . Arthritis/Rheumatoid Mother   . Hyperlipidemia Mother   . Thrombocytopenia Mother   . COPD Son        smoker  . Alzheimer's disease Maternal Grandmother   . Lung disease Paternal Grandfather    ROS General: Negative; No fevers, chills, or night sweats HEENT: Negative; No changes in vision or hearing, sinus congestion, difficulty swallowing Pulmonary: Negative; No cough, wheezing, shortness of breath, hemoptysis Cardiovascular: Negative; No chest pain, presyncope, syncope, palpatations GI: Negative; No nausea, vomiting, diarrhea, or abdominal pain GU: Negative; No dysuria, hematuria, or difficulty voiding Musculoskeletal: Positive for cervical disc disease. Hematologic: Negative; no easy bruising, bleeding Endocrine: Negative; no heat/cold intolerance Neuro: Negative; no changes in balance, headaches Skin: Negative; No rashes or skin lesions Psychiatric: Negative; No behavioral problems, depression Sleep: positive severe sleep apnea, Now having received another BiPAP unit  daytime sleepiness, hypersomnolence, and snoring, no bruxism, he admits to limb movement, but denies painful restless legs, no hypnogognic hallucinations, no cataplexy   PE BP 122/78   Pulse 65   Ht '5\' 9"'  (1.753 m)   Wt 223 lb (101.2 kg)   BMI 32.93 kg/m    Wt Readings from Last 3 Encounters:  07/06/16 223 lb (101.2 kg)  04/26/16 220 lb (99.8 kg)  12/10/15 226 lb 9.6 oz (102.8 kg)   General: Alert, oriented, no distress.  Skin: normal turgor, no rashes, warm and dry HEENT: Normocephalic, atraumatic. Pupils equal round and reactive to light; sclera anicteric; extraocular muscles intact;  Nose without nasal septal hypertrophy Mouth/Parynx benign; Mallinpatti scale 3 Neck: No JVD, no carotid bruits; normal  carotid upstroke; he is no longer wearing the full neck brace. Lungs: clear to ausculatation and percussion; no wheezing or rales Chest wall: without tenderness to palpitation Heart: PMI not displaced, RRR, s1 s2 normal, 1/6 systolic murmur, no diastolic murmur, no rubs, gallops, thrills, or heaves Abdomen: soft, nontender; no hepatosplenomehaly, BS+; abdominal aorta nontender and not dilated by palpation. Back: no CVA tenderness Pulses 2+ Musculoskeletal: full range of motion, normal strength, no joint deformities Extremities: no clubbing cyanosis or edema, Homan's sign negative  Neurologic: grossly nonfocal; Cranial nerves grossly wnl Psychologic: Normal mood and affect   ECG (independently read by me): Normal sinus rhythm at 65 bpm.  Normal intervals.  No ectopy.  Small nondiagnostic inferior Q waves   October 2017 ECG (independently read by me): Sinus rhythm with first-degree AV block.  Will intervals.  May 2017 ECG (independently read by me): Normal sinus rhythm at 73 bpm.  Small nondiagnostic inferior Q waves.  No ST segment changes.  April 2016 ECG (independently read by me): Normal sinus rhythm at 68 bpm.  Nonspecific T changes.  Normal intervals.  April 2015 ECG (independently read by me): Normal sinus rhythm at 61 beats per minute.  Normal intervals  Prior 03/28/2013 ECG (independently read by me): Sinus rhythm at 66 beats per minute. No ectopy. Q-wave in lead 3   LABS:  BMP Latest Ref Rng & Units 12/18/2015 11/10/2015 06/02/2015  Glucose 65 - 99 mg/dL 89 93 107(H)  BUN 7 - 25 mg/dL '9 8 10  ' Creatinine 0.70 - 1.25 mg/dL 0.72 0.90 0.85  Sodium 135 - 146  mmol/L 140 141 138  Potassium 3.5 - 5.3 mmol/L 4.2 3.4(L) 4.5  Chloride 98 - 110 mmol/L 105 105 101  CO2 20 - 31 mmol/L '24 26 27  ' Calcium 8.6 - 10.3 mg/dL 8.9 8.9 9.5    Hepatic Function Latest Ref Rng & Units 12/18/2015 01/20/2015 06/10/2014  Total Protein 6.1 - 8.1 g/dL 7.2 6.9 7.3  Albumin 3.6 - 5.1 g/dL 3.9 3.8 4.2  AST  10 - 35 U/L '15 18 19  ' ALT 9 - 46 U/L '15 14 17  ' Alk Phosphatase 40 - 115 U/L 103 104 102  Total Bilirubin 0.2 - 1.2 mg/dL 0.5 0.4 0.5    CBC Latest Ref Rng & Units 12/18/2015 11/10/2015 01/20/2015  WBC 3.8 - 10.8 K/uL 6.4 6.5 6.3  Hemoglobin 13.2 - 17.1 g/dL 15.0 15.0 15.1  Hematocrit 38.5 - 50.0 % 43.1 44.9 43.6  Platelets 140 - 400 K/uL 362 380 465(H)    Lab Results  Component Value Date   MCV 87.8 12/18/2015   MCV 91.6 11/10/2015   MCV 88.8 01/20/2015    Lab Results  Component Value Date   TSH 1.315 06/10/2014   No results found for: HGBA1C   Lipid Panel     Component Value Date/Time   CHOL 192 06/10/2014 0811   TRIG 170 (H) 06/10/2014 0811   HDL 32 (L) 06/10/2014 0811   CHOLHDL 6.0 06/10/2014 0811   VLDL 34 06/10/2014 0811   LDLCALC 126 (H) 06/10/2014 0811   BNP No results found for: PROBNP   RADIOLOGY: No results found.  IMPRESSION:  1. Coronary artery disease involving native coronary artery of native heart without angina pectoris   2. Essential hypertension   3. OSA (obstructive sleep apnea)   4. Hyperlipidemia with target LDL less than 70   5. Statin intolerance   6. Crohn's disease of large intestine with rectal bleeding [facility])     ASSESSMENT AND PLAN: Mr. Yedidya Duddy is a 66 year old gentleman who suffered an inferior wall myocardial infarction in 1994 due to total RCA occlusion with good left to right collaterals without significant contralateral disease. His last nuclear perfusion study did not demonstrate significant ischemia. However, ejection fraction was 47%. His echo Doppler study did show inferolateral wall motion abnormality.  He has been fairly stable without recurrent anginal symptoms.  He is on amlodipine 5 mg, Toprol-XL 50 mg, ramipril 10 mg daily, both for blood pressure and CAD.  He has not required any nitrates.   He developed significant myalgias to statins and is not on therapy.  His LDL last year was elevated at 126.  He may be a  candidate for Zetia or possible PCSK9  inhibition.  He had been compliant prior to his next surgery, but then he had to wear a neck collar for long duration.  Recently, he underwent anal fissure surgery secondary to fistula development from his Crohn's disease.  I obtained a new download in the office today.  He is not compliant both with reference to days used and usage time.  He has only been averaging 3 hours and 33 minutes per night.  He has a significant mask leak.  I have recommended he change his full face mask to the new restaurant extreme wear full face mask which I believe he will be able to tolerate well and should not cause any issues since it is no longer on the bridge of his nose.  He has felt improved sleep.  When he uses CPAP therapy.  He is not having anginal symptoms.  BMI is 32.93, and weight loss is recommended.  We will obtain a new download once he receives his new mask.  After one month treatment.  I will see him in 6 months for cardiology reevaluation. Time spent: 25 minutes Troy Sine, MD, [facility]  07/08/2016 4:10 PM

## 2016-07-14 ENCOUNTER — Encounter: Payer: Self-pay | Admitting: Internal Medicine

## 2016-07-14 ENCOUNTER — Ambulatory Visit (INDEPENDENT_AMBULATORY_CARE_PROVIDER_SITE_OTHER): Payer: Medicare Other | Admitting: Otolaryngology

## 2016-07-14 DIAGNOSIS — R490 Dysphonia: Secondary | ICD-10-CM | POA: Diagnosis not present

## 2016-07-14 DIAGNOSIS — R1312 Dysphagia, oropharyngeal phase: Secondary | ICD-10-CM | POA: Diagnosis not present

## 2016-07-26 ENCOUNTER — Telehealth: Payer: Self-pay | Admitting: Cardiovascular Disease

## 2016-07-26 NOTE — Telephone Encounter (Signed)
Pease call,concerning his C Pap.

## 2016-07-26 NOTE — Telephone Encounter (Signed)
Returned the call to the patient's wife per the DPR. She stated that her husband needs a new letter for a CPAP mask that states it a medical necessity. [facility] would not fill the previous order for the new mask.

## 2016-08-18 NOTE — Telephone Encounter (Signed)
Patient's wife notified download received and patient is still not compliant.he is still having a large leakage. She informed me that even though we sent order to [facility] for new Respironics dreamwear mask they said that the patient is not due to receive a new mask before November and would not give him one. Dr Claiborne Billings will be notified for further recommendations.

## 2016-08-23 DIAGNOSIS — I10 Essential (primary) hypertension: Secondary | ICD-10-CM | POA: Diagnosis not present

## 2016-08-23 DIAGNOSIS — K5090 Crohn's disease, unspecified, without complications: Secondary | ICD-10-CM | POA: Diagnosis not present

## 2016-08-23 DIAGNOSIS — M4802 Spinal stenosis, cervical region: Secondary | ICD-10-CM | POA: Diagnosis not present

## 2016-08-23 DIAGNOSIS — Z23 Encounter for immunization: Secondary | ICD-10-CM | POA: Diagnosis not present

## 2016-08-23 DIAGNOSIS — Z0001 Encounter for general adult medical examination with abnormal findings: Secondary | ICD-10-CM | POA: Diagnosis not present

## 2016-08-23 DIAGNOSIS — M1991 Primary osteoarthritis, unspecified site: Secondary | ICD-10-CM | POA: Diagnosis not present

## 2016-08-23 DIAGNOSIS — Z1389 Encounter for screening for other disorder: Secondary | ICD-10-CM | POA: Diagnosis not present

## 2016-08-23 DIAGNOSIS — Z6833 Body mass index (BMI) 33.0-33.9, adult: Secondary | ICD-10-CM | POA: Diagnosis not present

## 2016-08-23 DIAGNOSIS — E728 Other specified disorders of amino-acid metabolism: Secondary | ICD-10-CM | POA: Diagnosis not present

## 2016-08-23 DIAGNOSIS — E748 Other specified disorders of carbohydrate metabolism: Secondary | ICD-10-CM | POA: Diagnosis not present

## 2016-08-23 DIAGNOSIS — E6609 Other obesity due to excess calories: Secondary | ICD-10-CM | POA: Diagnosis not present

## 2016-08-23 DIAGNOSIS — R7301 Impaired fasting glucose: Secondary | ICD-10-CM | POA: Diagnosis not present

## 2016-08-30 NOTE — Telephone Encounter (Signed)
See if DME can re-adjust his current mask to reduce leak or change cushion.    Must have improved compliance; the leak is affecting his usage.

## 2016-08-31 ENCOUNTER — Other Ambulatory Visit: Payer: Self-pay | Admitting: *Deleted

## 2016-08-31 DIAGNOSIS — G4733 Obstructive sleep apnea (adult) (pediatric): Secondary | ICD-10-CM

## 2016-08-31 DIAGNOSIS — Z9989 Dependence on other enabling machines and devices: Principal | ICD-10-CM

## 2016-08-31 NOTE — Telephone Encounter (Signed)
Order sent to [place] to do mask fit assessment.

## 2016-09-13 ENCOUNTER — Encounter: Payer: Self-pay | Admitting: Internal Medicine

## 2016-09-13 ENCOUNTER — Ambulatory Visit (INDEPENDENT_AMBULATORY_CARE_PROVIDER_SITE_OTHER): Payer: Medicare Other | Admitting: Internal Medicine

## 2016-09-13 VITALS — BP 143/88 | HR 71 | Temp 97.4°F | Ht 65.0 in | Wt 220.8 lb

## 2016-09-13 DIAGNOSIS — K50013 Crohn's disease of small intestine with fistula: Secondary | ICD-10-CM | POA: Diagnosis not present

## 2016-09-13 DIAGNOSIS — I2510 Atherosclerotic heart disease of native coronary artery without angina pectoris: Secondary | ICD-10-CM | POA: Diagnosis not present

## 2016-09-13 NOTE — Progress Notes (Signed)
deleted

## 2016-09-13 NOTE — Patient Instructions (Signed)
Continue Apriso daily  May continue psyllium and probiotic  Avoid NSAIDS  Call if you change your mind about biologic therapy  OV 6 months.

## 2016-09-14 NOTE — Progress Notes (Signed)
Primary Care Physician:  Redmond School, MD Primary Gastroenterologist:  [name]  Pre-Procedure History & Physical: HPI:  [name] is a 66 y.o. male here for follow of Crohn's with fistulizing disease. Patient hass declined biologic / immunosuppressive therapy. No further operative intervention unless he gets biologic therapy according Dr. Johney Maine. He has one to 3 bowel movements daily -  passes a little blood only when he strains. Has been on psyllium and probiotic off and on;  states he feels better when he takes this regimen.Marland Kitchen He is also on off label Apriso. Recent CBC and BMET entirely normal. No abdominal pain,  no nausea or vomiting  . Past Medical History:  Diagnosis Date  . Anginal pain ([place])   . Anxiety   . Arthritis   . CAD (coronary artery disease)    a. Mid RCA CTO w/ L-R collateralization, nonobstructive dz elsewhere by 2009 cath  . Complication of anesthesia    heart rate dropped twice during surg  . Depression   . GERD (gastroesophageal reflux disease)   . Headache   . Hepatitis    in the early 70's not sure what kind  . History of tobacco abuse   . Hyperlipidemia    a. statin, niacin intolerant  . Hypertension   . Myocardial infarct [facility])    a. MI in 1994 s/p PTCA alone  . Nephrolithiasis   . Obesity   . Sleep apnea    does not tol BIPAP or CPAP    Past Surgical History:  Procedure Laterality Date  . ANAL FISTULOTOMY N/A 06/28/2013   Procedure: ANAL FISTULOTOMY;  Surgeon: [name], MD;  Location: [facility];  Service: General;  Laterality: N/A;  . ANTERIOR CERVICAL DECOMP/DISCECTOMY FUSION N/A 11/12/2015   Procedure: ANTERIOR CERVICAL DECOMPRESSION/DISCECTOMY FUSION CERVICAL THREE- CERVICAL FOUR, CERVICAL FOUR- CERVICAL FIVE;  Surgeon: Jovita Gamma, MD;  Location: [place];  Service: Neurosurgery;  Laterality: N/A;  ANTERIOR CERVICAL DECOMPRESSION/DISCECTOMY FUSION C3-C4, C4-C5  . BACK SURGERY    . BIOPSY THYROID    . [place],  2009   a. PTCA alone in 1994 b. RCA CTO w/ L-R collaterals, 40-50% prox RCA, 20% prox LAD; EF 50-55%, inferoapical HK  . CARDIAC CATHETERIZATION  04/02/2001   Continue medical therapy.  Marland Kitchen CARDIAC CATHETERIZATION  11/07/2000   Continue medical therapy.  Marland Kitchen CARDIAC CATHETERIZATION  09/24/1996   Continue medical therapy  . CARDIOVASCULAR STRESS TEST  04/06/2010   No scintigraphic evidence of inducible myocardial ischemia. No ECG changes. EKG negative for ischemia.  Marland Kitchen CARPAL TUNNEL RELEASE Left 10/09/2014   Procedure: LEFT CARPAL TUNNEL RELEASE;  Surgeon: [name], MD;  Location: [place];  Service: Orthopedics;  Laterality: Left;  . COLONOSCOPY N/A 11/06/2014   RMR: Skipped Colonic ulcerations with significant ileocecal valve involvement and rectal sparing most consisitant with Crohns disease. Minimal non-steroidal drug use. Probable colonic lipoma  . CYSTECTOMY    . INCISION AND DRAINAGE ABSCESS Right 05/23/2012   Procedure: INCISION AND DRAINAGE ABSCESS;  Surgeon: [name], MD;  Location: [facility];  Service: General;  Laterality: Right;  . KIDNEY STONE SURGERY  2004  . KNEE SURGERY    . LOWER EXTREMITY ARTERIAL DOPPLER  08/02/2007   Bilateral ABIs-no evidence of arterial insufficiency. Bilateral PVRs demonstrate normal waveforms.  Marland Kitchen NOSE SURGERY    . PILONIDAL CYST EXCISION    . SPLENECTOMY    . TRANSTHORACIC ECHOCARDIOGRAM  03/05/2009   EF >55%, normal  LV wall thickness.  . WRIST SURGERY     carpal tunnel    Prior to Admission medications   Medication Sig Start Date End Date Taking? Authorizing Provider  albuterol (PROVENTIL HFA;VENTOLIN HFA) 108 (90 BASE) MCG/ACT inhaler Inhale 1 puff into the lungs every 6 (six) hours as needed for wheezing or shortness of breath. Reported on 07/16/2015   Yes [provider]  ALPRAZolam Duanne Moron) 0.5 MG tablet Take 1 mg by mouth at bedtime as needed for sleep.    Yes [provider]  amLODipine (NORVASC) 5 MG tablet Take  1 tablet (5 mg total) by mouth daily. 03/28/13  Yes Troy Sine, MD  APRISO 0.375 g 24 hr capsule TAKE 4 CAPSULES(1.5 GRAMS) BY MOUTH DAILY 05/26/16  Yes Carlis Stable, NP  butalbital-acetaminophen-caffeine (FIORICET, ESGIC) 50-325-40 MG tablet Take 1 tablet by mouth as needed for headache.    Yes [provider]  fluticasone (FLONASE) 50 MCG/ACT nasal spray Place 1 spray into both nostrils daily. 09/09/15  Yes [provider]  ipratropium (ATROVENT) 0.03 % nasal spray Place 2 sprays into both nostrils at bedtime.  09/10/15  Yes [provider]  metoprolol succinate (TOPROL-XL) 50 MG 24 hr tablet Take 50 mg by mouth daily. Take with or immediately following a meal.   Yes [provider]  nitroGLYCERIN (NITROSTAT) 0.4 MG SL tablet Place 0.4 mg under the tongue every 5 (five) minutes as needed for chest pain. Reported on 07/16/2015   Yes [provider]  NON FORMULARY Bipap machine   Yes [provider]  ramipril (ALTACE) 10 MG capsule Take 10 mg by mouth daily.   Yes [provider]    Allergies as of 09/13/2016 - Review Complete 09/13/2016  Allergen Reaction Noted  . Niacin and related Other (See Comments) 05/22/2012  . Penicillins Hives and Itching   . Statins  03/28/2013    Family History  Problem Relation Age of Onset  . CAD Father        CABG   . CAD Brother        CABG in 65s  . CAD Brother   . Arthritis/Rheumatoid Mother   . Hyperlipidemia Mother   . Thrombocytopenia Mother   . COPD Son        smoker  . Alzheimer's disease Maternal Grandmother   . Lung disease Paternal Grandfather     Social History   Social History  . Marital status: Married    Spouse name: N/A  . Number of children: N/A  . Years of education: N/A   Occupational History  . Not on file.   Social History Main Topics  . Smoking status: Former Smoker    Packs/day: 2.00    Years: 28.00    Types: Cigarettes    Quit date: 03/28/1990  .  Smokeless tobacco: Former Systems developer    Types: Chew  . Alcohol use No  . Drug use: No  . Sexual activity: Yes   Other Topics Concern  . Not on file   Social History Narrative   Lives in [place], [name]. Married, 2 children.     Review of Systems: See HPI, otherwise negative ROS  Physical Exam: BP (!) 143/88   Pulse 71   Temp (!) 97.4 F (36.3 C) (Oral)   Ht 5\' 5"  (1.651 m)   Wt 220 lb 12.8 oz (100.2 kg)   BMI 36.74 kg/m  General:   Alert,  Well-developed, well-nourished, pleasant and cooperative in NAD Abdomen:  Non-distended, normal bowel sounds.  Soft and nontender without appreciable mass or hepatosplenomegaly.  Pulses:  Normal pulses noted. Extremities:  Without clubbing or edema.  Impression:  Pleasant 66 year old gentleman with Crohn's disease with the fistula formation. Clinically, doing fairly well at this time. Has not had definitive therapy in the way of the biologic/immunosuppressive therapy or further surgery. Patient has strong ideas about not taking such medications. He is content with off label Apriso He has not really had much discharge or perianal discomfort these days.  He wants to go back on a probiotic and psyllium.  Recommendations: Continue Apriso daily  May continue psyllium and probiotic  Avoid NSAIDS  Call if you change your mind about biologic therapy  OV 6 months.        Notice: This dictation was prepared with Dragon dictation along with smaller phrase technology. Any transcriptional errors that result from this process are unintentional and may not be corrected upon review.

## 2016-09-19 ENCOUNTER — Other Ambulatory Visit: Payer: Self-pay | Admitting: Nurse Practitioner

## 2016-11-07 DIAGNOSIS — M24112 Other articular cartilage disorders, left shoulder: Secondary | ICD-10-CM

## 2016-11-07 DIAGNOSIS — M7552 Bursitis of left shoulder: Secondary | ICD-10-CM

## 2016-11-07 DIAGNOSIS — M1990 Unspecified osteoarthritis, unspecified site: Secondary | ICD-10-CM

## 2016-11-07 DIAGNOSIS — M75102 Unspecified rotator cuff tear or rupture of left shoulder, not specified as traumatic: Secondary | ICD-10-CM

## 2016-11-07 HISTORY — DX: Other articular cartilage disorders, left shoulder: M24.112

## 2016-11-07 HISTORY — DX: Unspecified rotator cuff tear or rupture of left shoulder, not specified as traumatic: M75.102

## 2016-11-07 HISTORY — DX: Unspecified osteoarthritis, unspecified site: M19.90

## 2016-11-07 HISTORY — DX: Bursitis of left shoulder: M75.52

## 2016-11-08 DIAGNOSIS — M25512 Pain in left shoulder: Secondary | ICD-10-CM | POA: Diagnosis not present

## 2016-11-10 ENCOUNTER — Telehealth: Payer: Self-pay | Admitting: *Deleted

## 2016-11-10 NOTE — Telephone Encounter (Signed)
     Chart reviewed as part of pre-operative protocol coverage. Patient contaced 11/10/2016 in reference to pre-operative risk assessment for pending surgery as outlined below.  [name] was last seen on 07/06/2016 by 07/06/2016.  Since that day, [name] has done very well. He can climb up at least 2 flight of stairs without exertional chest pain. He can walk 4 blocks without exertional chest pain. He does have baseline dyspnea on extreme exertion, but not with daily activites. This is unchanged. He can achieve 6.27 METS based on Duke Activity Status Index. His Duke Revised Cardiac Risk index for the intended procedure is 0.9% risk for major cardiac event.   Therefore, based on ACC/AHA guidelines, the patient would be at acceptable risk for the planned procedure without further cardiovascular testing.   [place], [place] 11/10/2016, 3:01 PM

## 2016-11-10 NOTE — Telephone Encounter (Signed)
   [facility] Pre-operative Risk Assessment    Request for surgical clearance:  1. What type of surgery is being performed? Left shoulder scope rotator cuff repair   2. When is this surgery scheduled? TBD   3. Are there any medications that need to be held prior to surgery and how long? N/A   4. Practice name and name of physician performing surgery? Dr. Kathryne Hitch MD   5. What is your office phone and fax number? [phone] ext [phone] Attn: Sherri   6. Anesthesia type (None, local, MAC, general) ? General   Frances Joynt A Quamaine Webb 11/10/2016, 8:10 AM  _________________________________________________________________   (provider comments below)

## 2016-11-10 NOTE — Telephone Encounter (Signed)
Sent electronically to listed fax number, attn Sherri.

## 2016-11-12 DIAGNOSIS — M25512 Pain in left shoulder: Secondary | ICD-10-CM | POA: Diagnosis not present

## 2016-11-21 ENCOUNTER — Encounter (HOSPITAL_BASED_OUTPATIENT_CLINIC_OR_DEPARTMENT_OTHER): Payer: Self-pay | Admitting: *Deleted

## 2016-11-21 NOTE — H&P (Signed)
Pleasant 66 year old gentleman who presents to our clinic today with recurrent left shoulder pain.  We saw him back in May where we injected him subacromially.  This significant helped, but only lasted for a month.  Once his pain returned it has continued to get worse and he is starting to lose motion.  No radicular symptoms noted.   Past Medical, Family and Social History reviewed in detail on patient questionnaire and signed.   Review of systems as detailed on HPI; all others reviewed and are negative.    EXAMINATION: Well-developed, well-nourished male in no acute distress.  Alert and oriented x 3.  Examination of his left shoulder reveals 50% active range of motion and this is pain mediated.  Negative drop arm, negative cross body, positive empty can.  He is neurovascularly intact distally.  IMPRESSION: Left shoulder rotator cuff tendinitis versus tear.  PLAN: At this point, we are going to go ahead and get an MRI to assess Reino's left shoulder, specifically rotator cuff.  He is to call us once this is completed.  We are going to ahead and fill out paperwork to proceed with left shoulder arthroscopic decompression and possible rotator cuff repair if this is torn.  We have also discussed the merits of future operative intervention to include a reverse total shoulder replacement if need be.  We will call and discuss the results of his MRI and then proceed with the definitive treatment of choice based on the scan.  Addendum: Left shoulder MRI shows small rc tear and AC oa.  This was discussed with patient and he would like to proceed with above procedure.

## 2016-11-21 NOTE — Pre-Procedure Instructions (Signed)
Cardiology note from 07/06/2016 reviewed by Dr. Ambrose Pancoast; pt. needs to be done at [facility] due to severe sleep apnea, CAD and previous complications with anesthesia. Sherri at Dr. Debroah Loop office notified of same.

## 2016-11-29 ENCOUNTER — Encounter (HOSPITAL_BASED_OUTPATIENT_CLINIC_OR_DEPARTMENT_OTHER): Payer: Self-pay | Admitting: *Deleted

## 2016-11-29 DIAGNOSIS — M25512 Pain in left shoulder: Secondary | ICD-10-CM | POA: Diagnosis not present

## 2016-11-29 MED ORDER — LACTATED RINGERS IV SOLN: INTRAVENOUS | Status: DC

## 2016-11-29 MED ORDER — MIDAZOLAM HCL 2 MG/2ML IJ SOLN: 1.0000 mg | INTRAMUSCULAR | Status: DC | PRN

## 2016-11-29 MED ORDER — FENTANYL CITRATE (PF) 100 MCG/2ML IJ SOLN: 50.0000 ug | INTRAMUSCULAR | Status: DC | PRN

## 2016-11-29 MED ORDER — VANCOMYCIN HCL 10 G IV SOLR
1500.0000 mg | INTRAVENOUS | Status: AC
  Administered 2016-11-30: 1500 mg via INTRAVENOUS
  Filled 2016-11-29: qty 1500

## 2016-11-29 MED ORDER — SCOPOLAMINE 1 MG/3DAYS TD PT72: 1.0000 | MEDICATED_PATCH | Freq: Once | TRANSDERMAL | Status: DC | PRN

## 2016-11-29 NOTE — Progress Notes (Signed)
Mr Ridgley has given his wife permission to do his SDW call.  Mrs Vaquera states patient has "not had chest pain in many, many years".  "He may get short of breath after mowing the lawn, so he has to sit down and cool off."  Patient's cardiologist is Dr Shelva Majestic.

## 2016-11-30 ENCOUNTER — Ambulatory Visit (HOSPITAL_BASED_OUTPATIENT_CLINIC_OR_DEPARTMENT_OTHER)
Admission: RE | Admit: 2016-11-30 | Discharge: 2016-11-30 | Disposition: A | Discharge status: Home / Self Care | Payer: Medicare Other | Source: Ambulatory Visit | Attending: Orthopedic Surgery | Admitting: Orthopedic Surgery

## 2016-11-30 ENCOUNTER — Ambulatory Visit (HOSPITAL_COMMUNITY): Payer: Medicare Other | Admitting: Certified Registered"

## 2016-11-30 ENCOUNTER — Encounter (HOSPITAL_COMMUNITY)
Admission: RE | Disposition: A | Discharge status: Home / Self Care | Payer: Self-pay | Source: Ambulatory Visit | Attending: Orthopedic Surgery

## 2016-11-30 ENCOUNTER — Encounter (HOSPITAL_COMMUNITY): Payer: Self-pay | Admitting: *Deleted

## 2016-11-30 DIAGNOSIS — I2510 Atherosclerotic heart disease of native coronary artery without angina pectoris: Secondary | ICD-10-CM | POA: Insufficient documentation

## 2016-11-30 DIAGNOSIS — Z6832 Body mass index (BMI) 32.0-32.9, adult: Secondary | ICD-10-CM | POA: Insufficient documentation

## 2016-11-30 DIAGNOSIS — M75102 Unspecified rotator cuff tear or rupture of left shoulder, not specified as traumatic: Secondary | ICD-10-CM | POA: Insufficient documentation

## 2016-11-30 DIAGNOSIS — X58XXXA Exposure to other specified factors, initial encounter: Secondary | ICD-10-CM | POA: Diagnosis not present

## 2016-11-30 DIAGNOSIS — M19012 Primary osteoarthritis, left shoulder: Secondary | ICD-10-CM | POA: Diagnosis not present

## 2016-11-30 DIAGNOSIS — M75121 Complete rotator cuff tear or rupture of right shoulder, not specified as traumatic: Secondary | ICD-10-CM | POA: Diagnosis not present

## 2016-11-30 DIAGNOSIS — Z9989 Dependence on other enabling machines and devices: Secondary | ICD-10-CM | POA: Diagnosis not present

## 2016-11-30 DIAGNOSIS — S46212A Strain of muscle, fascia and tendon of other parts of biceps, left arm, initial encounter: Secondary | ICD-10-CM | POA: Insufficient documentation

## 2016-11-30 DIAGNOSIS — I252 Old myocardial infarction: Secondary | ICD-10-CM | POA: Diagnosis not present

## 2016-11-30 DIAGNOSIS — M25512 Pain in left shoulder: Secondary | ICD-10-CM | POA: Diagnosis present

## 2016-11-30 DIAGNOSIS — M7552 Bursitis of left shoulder: Secondary | ICD-10-CM | POA: Diagnosis not present

## 2016-11-30 DIAGNOSIS — M25812 Other specified joint disorders, left shoulder: Secondary | ICD-10-CM | POA: Diagnosis not present

## 2016-11-30 DIAGNOSIS — Z87891 Personal history of nicotine dependence: Secondary | ICD-10-CM | POA: Insufficient documentation

## 2016-11-30 DIAGNOSIS — M24112 Other articular cartilage disorders, left shoulder: Secondary | ICD-10-CM | POA: Diagnosis not present

## 2016-11-30 DIAGNOSIS — I10 Essential (primary) hypertension: Secondary | ICD-10-CM | POA: Diagnosis not present

## 2016-11-30 DIAGNOSIS — G4730 Sleep apnea, unspecified: Secondary | ICD-10-CM | POA: Insufficient documentation

## 2016-11-30 DIAGNOSIS — M7522 Bicipital tendinitis, left shoulder: Secondary | ICD-10-CM | POA: Diagnosis not present

## 2016-11-30 DIAGNOSIS — G8918 Other acute postprocedural pain: Secondary | ICD-10-CM | POA: Diagnosis not present

## 2016-11-30 HISTORY — DX: Personal history of other infectious and parasitic diseases: Z86.19

## 2016-11-30 HISTORY — DX: Atherosclerotic heart disease of native coronary artery without angina pectoris: I25.10

## 2016-11-30 HISTORY — DX: Unspecified cataract: H26.9

## 2016-11-30 HISTORY — DX: Anesthesia of skin: R20.0

## 2016-11-30 HISTORY — DX: Unspecified rotator cuff tear or rupture of left shoulder, not specified as traumatic: M75.102

## 2016-11-30 HISTORY — DX: Personal history of urinary calculi: Z87.442

## 2016-11-30 HISTORY — DX: Stiffness of unspecified joint, not elsewhere classified: M25.60

## 2016-11-30 HISTORY — PX: SHOULDER ARTHROSCOPY WITH SUBACROMIAL DECOMPRESSION, ROTATOR CUFF REPAIR AND BICEP TENDON REPAIR: SHX5687

## 2016-11-30 HISTORY — DX: Nontoxic single thyroid nodule: E04.1

## 2016-11-30 HISTORY — DX: Other articular cartilage disorders, left shoulder: M24.112

## 2016-11-30 HISTORY — DX: Bursitis of left shoulder: M75.52

## 2016-11-30 HISTORY — DX: Crohn's disease, unspecified, without complications: K50.90

## 2016-11-30 HISTORY — DX: Dyspnea, unspecified: R06.00

## 2016-11-30 HISTORY — DX: Unspecified osteoarthritis, unspecified site: M19.90

## 2016-11-30 LAB — BASIC METABOLIC PANEL
Anion gap: 13 (ref 5–15)
BUN: 11 mg/dL (ref 6–20)
CO2: 22 mmol/L (ref 22–32)
Calcium: 8.9 mg/dL (ref 8.9–10.3)
Chloride: 103 mmol/L (ref 101–111)
Creatinine, Ser: 0.86 mg/dL (ref 0.61–1.24)
GFR calc Af Amer: >60 mL/min (ref >60)
GFR calc non Af Amer: >60 mL/min (ref >60)
Glucose, Bld: 93 mg/dL (ref 65–99)
Potassium: 4 mmol/L (ref 3.5–5.1)
Sodium: 138 mmol/L (ref 135–145)

## 2016-11-30 LAB — CBC
HCT: 45 % (ref 39.0–52.0)
Hemoglobin: 15.5 g/dL (ref 13.0–17.0)
MCH: 30.9 pg (ref 26.0–34.0)
MCHC: 34.4 g/dL (ref 30.0–36.0)
MCV: 89.6 fL (ref 78.0–100.0)
Platelets: 367 10*3/uL (ref 150–400)
RBC: 5.02 MIL/uL (ref 4.22–5.81)
RDW: 14.3 % (ref 11.5–15.5)
WBC: 6.7 10*3/uL (ref 4.0–10.5)

## 2016-11-30 SURGERY — SHOULDER ARTHROSCOPY WITH SUBACROMIAL DECOMPRESSION, ROTATOR CUFF REPAIR AND BICEP TENDON REPAIR
Anesthesia: General | Site: Shoulder | Laterality: Left

## 2016-11-30 MED ORDER — PHENYLEPHRINE HCL 10 MG/ML IJ SOLN
INTRAMUSCULAR | Status: DC | PRN
  Administered 2016-11-30: 50 ug/min via INTRAVENOUS

## 2016-11-30 MED ORDER — FENTANYL CITRATE (PF) 100 MCG/2ML IJ SOLN
INTRAMUSCULAR | Status: AC
  Filled 2016-11-30: qty 2

## 2016-11-30 MED ORDER — CHLORHEXIDINE GLUCONATE 4 % EX LIQD: 60.0000 mL | Freq: Once | CUTANEOUS | Status: DC

## 2016-11-30 MED ORDER — OXYCODONE HCL 5 MG PO TABS: 5.0000 mg | ORAL_TABLET | Freq: Once | ORAL | Status: DC | PRN

## 2016-11-30 MED ORDER — FENTANYL CITRATE (PF) 100 MCG/2ML IJ SOLN
INTRAMUSCULAR | Status: DC | PRN
Start: 2016-11-30 — End: 2016-11-30
  Administered 2016-11-30: 100 ug via INTRAVENOUS

## 2016-11-30 MED ORDER — HYDROMORPHONE HCL 1 MG/ML IJ SOLN: 0.5000 mg | INTRAMUSCULAR | Status: DC | PRN

## 2016-11-30 MED ORDER — FENTANYL CITRATE (PF) 100 MCG/2ML IJ SOLN
75.0000 ug | Freq: Once | INTRAMUSCULAR | Status: AC
  Administered 2016-11-30: 75 ug via INTRAVENOUS

## 2016-11-30 MED ORDER — MIDAZOLAM HCL 2 MG/2ML IJ SOLN
1.0000 mg | Freq: Once | INTRAMUSCULAR | Status: AC
  Administered 2016-11-30: 1 mg via INTRAVENOUS

## 2016-11-30 MED ORDER — PHENYLEPHRINE 40 MCG/ML (10ML) SYRINGE FOR IV PUSH (FOR BLOOD PRESSURE SUPPORT)
PREFILLED_SYRINGE | INTRAVENOUS | Status: DC | PRN
  Administered 2016-11-30: 120 ug via INTRAVENOUS
  Administered 2016-11-30: 80 ug via INTRAVENOUS
  Administered 2016-11-30: 120 ug via INTRAVENOUS
  Administered 2016-11-30: 80 ug via INTRAVENOUS

## 2016-11-30 MED ORDER — DEXAMETHASONE SODIUM PHOSPHATE 10 MG/ML IJ SOLN
INTRAMUSCULAR | Status: DC | PRN
  Administered 2016-11-30: 5 mg via INTRAVENOUS

## 2016-11-30 MED ORDER — FENTANYL CITRATE (PF) 250 MCG/5ML IJ SOLN
INTRAMUSCULAR | Status: AC
  Filled 2016-11-30: qty 5

## 2016-11-30 MED ORDER — LIDOCAINE 2% (20 MG/ML) 5 ML SYRINGE
INTRAMUSCULAR | Status: AC
  Filled 2016-11-30: qty 5

## 2016-11-30 MED ORDER — LIDOCAINE 2% (20 MG/ML) 5 ML SYRINGE
INTRAMUSCULAR | Status: DC | PRN
  Administered 2016-11-30: 100 mg via INTRAVENOUS

## 2016-11-30 MED ORDER — BUPIVACAINE HCL (PF) 0.25 % IJ SOLN
INTRAMUSCULAR | Status: AC
  Filled 2016-11-30: qty 30

## 2016-11-30 MED ORDER — DEXAMETHASONE SODIUM PHOSPHATE 10 MG/ML IJ SOLN
INTRAMUSCULAR | Status: AC
  Filled 2016-11-30: qty 1

## 2016-11-30 MED ORDER — GLYCOPYRROLATE 0.2 MG/ML IJ SOLN
INTRAMUSCULAR | Status: DC | PRN
  Administered 2016-11-30 (×2): 0.2 mg via INTRAVENOUS

## 2016-11-30 MED ORDER — PROPOFOL 10 MG/ML IV BOLUS
INTRAVENOUS | Status: DC | PRN
  Administered 2016-11-30: 120 mg via INTRAVENOUS

## 2016-11-30 MED ORDER — OXYCODONE HCL 5 MG/5ML PO SOLN: 5.0000 mg | Freq: Once | ORAL | Status: DC | PRN

## 2016-11-30 MED ORDER — ONDANSETRON HCL 4 MG/2ML IJ SOLN
INTRAMUSCULAR | Status: AC
  Filled 2016-11-30: qty 2

## 2016-11-30 MED ORDER — OXYCODONE-ACETAMINOPHEN 5-325 MG PO TABS: 1.0000 | ORAL_TABLET | ORAL | Status: DC | PRN

## 2016-11-30 MED ORDER — ONDANSETRON HCL 4 MG/2ML IJ SOLN
INTRAMUSCULAR | Status: DC | PRN
  Administered 2016-11-30: 4 mg via INTRAVENOUS

## 2016-11-30 MED ORDER — SUCCINYLCHOLINE CHLORIDE 200 MG/10ML IV SOSY
PREFILLED_SYRINGE | INTRAVENOUS | Status: AC
  Filled 2016-11-30: qty 10

## 2016-11-30 MED ORDER — ROCURONIUM BROMIDE 10 MG/ML (PF) SYRINGE
PREFILLED_SYRINGE | INTRAVENOUS | Status: AC
  Filled 2016-11-30: qty 5

## 2016-11-30 MED ORDER — MIDAZOLAM HCL 2 MG/2ML IJ SOLN
INTRAMUSCULAR | Status: AC
  Filled 2016-11-30: qty 2

## 2016-11-30 MED ORDER — METOCLOPRAMIDE HCL 5 MG/ML IJ SOLN: 5.0000 mg | Freq: Three times a day (TID) | INTRAMUSCULAR | Status: DC | PRN

## 2016-11-30 MED ORDER — ONDANSETRON HCL 4 MG PO TABS: 4.0000 mg | ORAL_TABLET | Freq: Four times a day (QID) | ORAL | Status: DC | PRN

## 2016-11-30 MED ORDER — SUGAMMADEX SODIUM 500 MG/5ML IV SOLN
INTRAVENOUS | Status: AC
  Filled 2016-11-30: qty 5

## 2016-11-30 MED ORDER — BUPIVACAINE-EPINEPHRINE (PF) 0.5% -1:200000 IJ SOLN
INTRAMUSCULAR | Status: DC | PRN
  Administered 2016-11-30: 25 mL via PERINEURAL

## 2016-11-30 MED ORDER — SUGAMMADEX SODIUM 500 MG/5ML IV SOLN
INTRAVENOUS | Status: DC | PRN
  Administered 2016-11-30: 300 mg via INTRAVENOUS

## 2016-11-30 MED ORDER — ONDANSETRON HCL 4 MG PO TABS: 4.0000 mg | ORAL_TABLET | Freq: Three times a day (TID) | ORAL | 0 refills | Status: DC | PRN

## 2016-11-30 MED ORDER — LACTATED RINGERS IV SOLN
INTRAVENOUS | Status: DC
  Administered 2016-11-30: 07:00:00 via INTRAVENOUS

## 2016-11-30 MED ORDER — METOCLOPRAMIDE HCL 5 MG PO TABS: 5.0000 mg | ORAL_TABLET | Freq: Three times a day (TID) | ORAL | Status: DC | PRN

## 2016-11-30 MED ORDER — PROPOFOL 10 MG/ML IV BOLUS
INTRAVENOUS | Status: AC
  Filled 2016-11-30: qty 20

## 2016-11-30 MED ORDER — ROCURONIUM BROMIDE 10 MG/ML (PF) SYRINGE
PREFILLED_SYRINGE | INTRAVENOUS | Status: DC | PRN
  Administered 2016-11-30: 45 mg via INTRAVENOUS

## 2016-11-30 MED ORDER — ONDANSETRON HCL 4 MG/2ML IJ SOLN: 4.0000 mg | Freq: Four times a day (QID) | INTRAMUSCULAR | Status: DC | PRN

## 2016-11-30 MED ORDER — SODIUM CHLORIDE 0.9 % IR SOLN
Status: DC | PRN
  Administered 2016-11-30: 6000 mL

## 2016-11-30 MED ORDER — OXYCODONE-ACETAMINOPHEN 5-325 MG PO TABS: 1.0000 | ORAL_TABLET | ORAL | 0 refills | Status: DC | PRN

## 2016-11-30 MED ORDER — HYDROMORPHONE HCL 1 MG/ML IJ SOLN: 0.2500 mg | INTRAMUSCULAR | Status: DC | PRN

## 2016-11-30 SURGICAL SUPPLY — 98 items
APL SKNCLS STERI-STRIP NONHPOA (GAUZE/BANDAGES/DRESSINGS)
BENZOIN TINCTURE PRP APPL 2/3 (GAUZE/BANDAGES/DRESSINGS) IMPLANT
BLADE CUTTER GATOR 3.5 (BLADE) ×3 IMPLANT
BLADE CUTTER MENIS 5.5 (BLADE) IMPLANT
BLADE GREAT WHITE 4.2 (BLADE) ×2 IMPLANT
BLADE GREAT WHITE 4.2MM (BLADE) ×1
BLADE SURG 11 STRL SS (BLADE) ×3 IMPLANT
BLADE SURG 15 STRL LF DISP TIS (BLADE) IMPLANT
BLADE SURG 15 STRL SS (BLADE)
BUR OVAL 6.0 (BURR) ×3 IMPLANT
CANISTER OMNI JUG 16 LITER (MISCELLANEOUS) ×1 IMPLANT
CANISTER SUCT 3000ML PPV (MISCELLANEOUS) IMPLANT
CANNULA TWIST IN 8.25X7CM (CANNULA) IMPLANT
CLOSURE WOUND 1/2 X4 (GAUZE/BANDAGES/DRESSINGS)
COVER SURGICAL LIGHT HANDLE (MISCELLANEOUS) ×3 IMPLANT
DECANTER SPIKE VIAL GLASS SM (MISCELLANEOUS) IMPLANT
DRAPE ORTHO SPLIT 77X108 STRL (DRAPES) ×6
DRAPE STERI 35X30 U-POUCH (DRAPES) ×3 IMPLANT
DRAPE SURG ORHT 6 SPLT 77X108 (DRAPES) IMPLANT
DRAPE U-SHAPE 47X51 STRL (DRAPES) ×3 IMPLANT
DRAPE U-SHAPE 76X120 STRL (DRAPES) ×2 IMPLANT
DRSG PAD ABDOMINAL 8X10 ST (GAUZE/BANDAGES/DRESSINGS) ×1 IMPLANT
DURAPREP 26ML APPLICATOR (WOUND CARE) ×3 IMPLANT
ELECT MENISCUS 165MM 90D (ELECTRODE) ×3 IMPLANT
ELECT NDL TIP 2.8 STRL (NEEDLE) IMPLANT
ELECT NEEDLE TIP 2.8 STRL (NEEDLE) IMPLANT
ELECT REM PT RETURN 9FT ADLT (ELECTROSURGICAL) ×3
ELECTRODE REM PT RTRN 9FT ADLT (ELECTROSURGICAL) ×1 IMPLANT
FACESHIELD WRAPAROUND (MASK) ×3 IMPLANT
FACESHIELD WRAPAROUND OR TEAM (MASK) ×1 IMPLANT
FLUID NSS /IRRIG 3000 ML XXX (IV SOLUTION) ×8 IMPLANT
GAUZE SPONGE 4X4 12PLY STRL (GAUZE/BANDAGES/DRESSINGS) ×2 IMPLANT
GAUZE SPONGE 4X4 12PLY STRL LF (GAUZE/BANDAGES/DRESSINGS) ×2 IMPLANT
GAUZE XEROFORM 1X8 LF (GAUZE/BANDAGES/DRESSINGS) ×3 IMPLANT
GLOVE BIOGEL PI IND STRL 7.0 (GLOVE) ×1 IMPLANT
GLOVE BIOGEL PI INDICATOR 7.0 (GLOVE) ×2
GLOVE ECLIPSE 7.0 STRL STRAW (GLOVE) ×3 IMPLANT
GLOVE ORTHO TXT STRL SZ7.5 (GLOVE) ×5 IMPLANT
GOWN STRL REUS W/ TWL LRG LVL3 (GOWN DISPOSABLE) ×2 IMPLANT
GOWN STRL REUS W/ TWL XL LVL3 (GOWN DISPOSABLE) ×1 IMPLANT
GOWN STRL REUS W/TWL LRG LVL3 (GOWN DISPOSABLE) ×6
GOWN STRL REUS W/TWL XL LVL3 (GOWN DISPOSABLE) ×3
KIT BASIN OR (CUSTOM PROCEDURE TRAY) ×3 IMPLANT
KIT ROOM TURNOVER OR (KITS) ×3 IMPLANT
MANIFOLD NEPTUNE II (INSTRUMENTS) ×3 IMPLANT
NDL 1/2 CIR CATGUT .05X1.09 (NEEDLE) IMPLANT
NDL SCORPION MULTI FIRE (NEEDLE) IMPLANT
NDL SPNL 18GX3.5 QUINCKE PK (NEEDLE) ×1 IMPLANT
NDL SUT 6 .5 CRC .975X.05 MAYO (NEEDLE) IMPLANT
NEEDLE 1/2 CIR CATGUT .05X1.09 (NEEDLE) IMPLANT
NEEDLE 22X1 1/2 (OR ONLY) (NEEDLE) ×2 IMPLANT
NEEDLE MAYO TAPER (NEEDLE)
NEEDLE SCORPION MULTI FIRE (NEEDLE) ×3 IMPLANT
NEEDLE SPNL 18GX3.5 QUINCKE PK (NEEDLE) ×3 IMPLANT
NS IRRIG 1000ML POUR BTL (IV SOLUTION) IMPLANT
PACK ARTHROSCOPY DSU (CUSTOM PROCEDURE TRAY) ×1 IMPLANT
PACK SHOULDER (CUSTOM PROCEDURE TRAY) ×3 IMPLANT
PAD ABD 8X10 STRL (GAUZE/BANDAGES/DRESSINGS) ×2 IMPLANT
PAD ARMBOARD 7.5X6 YLW CONV (MISCELLANEOUS) ×6 IMPLANT
PASSER SUT SWANSON 36MM LOOP (INSTRUMENTS) IMPLANT
PENCIL BUTTON HOLSTER BLD 10FT (ELECTRODE) ×1 IMPLANT
RETRIEVER SUT LRG (INSTRUMENTS) IMPLANT
SET ARTHROSCOPY TUBING (MISCELLANEOUS) ×3
SET ARTHROSCOPY TUBING LN (MISCELLANEOUS) ×1 IMPLANT
SLING ARM FOAM STRAP LRG (SOFTGOODS) ×2 IMPLANT
SLING ARM FOAM STRAP MED (SOFTGOODS) IMPLANT
SLING ARM IMMOBILIZER LRG (SOFTGOODS) ×2 IMPLANT
SLING ARM IMMOBILIZER MED (SOFTGOODS) IMPLANT
SLING ARM XL FOAM STRAP (SOFTGOODS) IMPLANT
SPONGE LAP 4X18 X RAY DECT (DISPOSABLE) IMPLANT
STAPLER VISISTAT 35W (STAPLE) IMPLANT
STRIP CLOSURE SKIN 1/2X4 (GAUZE/BANDAGES/DRESSINGS) IMPLANT
SUCTION FRAZIER HANDLE 10FR (MISCELLANEOUS)
SUCTION TUBE FRAZIER 10FR DISP (MISCELLANEOUS) IMPLANT
SUT ETHIBOND 2 OS 4 DA (SUTURE) IMPLANT
SUT ETHILON 2 0 FS 18 (SUTURE) IMPLANT
SUT ETHILON 3 0 PS 1 (SUTURE) IMPLANT
SUT ETHILON 4 0 PS 2 18 (SUTURE) ×3 IMPLANT
SUT FIBERWIRE #2 38 T-5 BLUE (SUTURE)
SUT RETRIEVER MED (INSTRUMENTS) IMPLANT
SUT STEEL 4 (SUTURE) IMPLANT
SUT STEEL 5 (SUTURE) IMPLANT
SUT TIGER TAPE 7 IN WHITE (SUTURE) IMPLANT
SUT VIC AB 0 CT1 27 (SUTURE)
SUT VIC AB 0 CT1 27XBRD ANBCTR (SUTURE) IMPLANT
SUT VIC AB 2-0 FS1 27 (SUTURE) IMPLANT
SUT VIC AB 2-0 SH 27 (SUTURE)
SUT VIC AB 2-0 SH 27XBRD (SUTURE) IMPLANT
SUT VIC AB 3-0 FS2 27 (SUTURE) IMPLANT
SUTURE FIBERWR #2 38 T-5 BLUE (SUTURE) IMPLANT
SYR BULB 3OZ (MISCELLANEOUS) IMPLANT
SYR CONTROL 10ML LL (SYRINGE) IMPLANT
TAPE CLOTH SURG 6X10 WHT LF (GAUZE/BANDAGES/DRESSINGS) ×2 IMPLANT
TAPE FIBER 2MM 7IN #2 BLUE (SUTURE) IMPLANT
TOWEL OR 17X24 6PK STRL BLUE (TOWEL DISPOSABLE) ×3 IMPLANT
TOWEL OR 17X26 10 PK STRL BLUE (TOWEL DISPOSABLE) ×4 IMPLANT
WATER STERILE IRR 1000ML POUR (IV SOLUTION) ×1 IMPLANT
YANKAUER SUCT BULB TIP NO VENT (SUCTIONS) IMPLANT

## 2016-11-30 NOTE — Anesthesia Procedure Notes (Addendum)
Anesthesia Regional Block: Interscalene brachial plexus block   Pre-Anesthetic Checklist: ,, timeout performed, Correct Patient, Correct Site, Correct Laterality, Correct Procedure, Correct Position, site marked, Risks and benefits discussed,  Surgical consent,  Pre-op evaluation,  At surgeon's request and post-op pain management  Laterality: Upper and Left  Prep: chloraprep, alcohol swabs       Needles:  Injection technique: Single-shot  Needle Type: Stimulator Needle - 40     Needle Length: 4cm  Needle Gauge: 21   Needle insertion depth: 4 cm   Additional Needles:   Procedures:, nerve stimulator,,, ultrasound used (permanent image in chart),,,,   Nerve Stimulator or Paresthesia:  Response: Twitch elicited, 0.5 mA, 0.3 ms,   Additional Responses:   Narrative:  Start time: 11/30/2016 8:05 AM End time: 11/30/2016 8:18 AM  Performed by: Personally  Anesthesiologist: Channon Ambrosini  Additional Notes: Block assessed prior to start of surgery

## 2016-11-30 NOTE — Discharge Instructions (Signed)
Shouder arthroscopy, partial rotator cuff tear debridement subacromial decompression Care After Instructions Refer to this sheet in the next few weeks. These discharge instructions provide you with general information on caring for yourself after you leave the hospital. Your caregiver may also give you specific instructions. Your treatment has been planned according to the most current medical practices available, but unavoidable complications sometimes occur. If you have any problems or questions after discharge, please call your caregiver. HOME INSTRUCTIONS You may resume a normal diet and activities as directed. Take showers instead of baths until informed otherwise.  Change bandages (dressings) in 3 days.  Swab wounds daily with betadine.  Wash shoulder with soap and water.  Pat dry.  Cover wounds with bandaids. Only take over-the-counter or prescription medicines for pain, discomfort, or fever as directed by your caregiver.  Wear your sling for the next 2 days unless otherwise instructed. Eat a well-balanced diet.  Avoid lifting or driving until you are instructed otherwise.  Make an appointment to see your caregiver for stitches (suture) or staple removal one week after surgery.  SEEK MEDICAL CARE IF: You have swelling of your calf or leg.  You develop shortness of breath or chest pain.  You have redness, swelling, or increasing pain in the wound.  There is pus or any unusual drainage coming from the surgical site.  You notice a bad smell coming from the surgical site or dressing.  The surgical site breaks open after sutures or staples have been removed.  There is persistent bleeding from the suture or staple line.  You are getting worse or are not improving.  You have any other questions or concerns.  SEEK IMMEDIATE MEDICAL CARE IF:  You have a fever greater than 101 You develop a rash.  You have difficulty breathing.  You develop any reaction or side effects to medicines given.    Your knee motion is decreasing rather than improving.  MAKE SURE YOU:  Understand these instructions.  Will watch your condition.  Will get help right away if you are not doing well or get worse.

## 2016-11-30 NOTE — Anesthesia Procedure Notes (Signed)
Procedure Name: Intubation Date/Time: 11/30/2016 9:44 AM Performed by: Melina Copa, Cire Deyarmin R Pre-anesthesia Checklist: Patient identified, Emergency Drugs available, Suction available and Patient being monitored Patient Re-evaluated:Patient Re-evaluated prior to induction Oxygen Delivery Method: Circle System Utilized Preoxygenation: Pre-oxygenation with 100% oxygen Induction Type: IV induction Ventilation: Mask ventilation without difficulty Laryngoscope Size: Mac and 4 Grade View: Grade II Tube type: Oral Tube size: 7.5 mm Number of attempts: 1 Airway Equipment and Method: Stylet and Oral airway Placement Confirmation: ETT inserted through vocal cords under direct vision,  positive ETCO2 and breath sounds checked- equal and bilateral Secured at: 22 cm Tube secured with: Tape Dental Injury: Teeth and Oropharynx as per pre-operative assessment

## 2016-11-30 NOTE — Interval H&P Note (Signed)
History and Physical Interval Note:  11/30/2016 8:21 AM  [name]  has presented today for surgery, with the diagnosis of Other articular cartilage disorders, left shoulder, Primary osteoarthritis, left shoulder, Bursitis of left shoulder, complete rotator cuff tear or rupture of left shoulder, not specified as traumatic  The various methods of treatment have been discussed with the patient and family. After consideration of risks, benefits and other options for treatment, the patient has consented to  Procedure(s): LEFT SHOULDER ARTHROSCOPY, DEBRIDEMENT, ACROMIOPLASTY WITH DISTAL CLAVICAL EXCISION, POSSIBLE ROTATOR CUFF REPAIR AND BICEP TENODESIS (Left) as a surgical intervention .  The patient's history has been reviewed, patient examined, no change in status, stable for surgery.  I have reviewed the patient's chart and labs.  Questions were answered to the patient's satisfaction.     [name]

## 2016-11-30 NOTE — Transfer of Care (Signed)
Immediate Anesthesia Transfer of Care Note  Patient: CAS TRACZ  Procedure(s) Performed: LEFT SHOULDER ARTHROSCOPY, DEBRIDEMENT, ACROMIOPLASTY WITH DISTAL CLAVICAL EXCISION, POSSIBLE ROTATOR CUFF REPAIR AND BICEP TENODESIS (Left Shoulder)  Patient Location: PACU  Anesthesia Type:GA combined with regional for post-op pain  Level of Consciousness: awake, oriented and patient cooperative  Airway & Oxygen Therapy: Patient Spontanous Breathing and Patient connected to nasal cannula oxygen  Post-op Assessment: Report given to RN, Post -op Vital signs reviewed and stable and Patient moving all extremities  Post vital signs: Reviewed and stable  Last Vitals:  Vitals:   11/30/16 0815 11/30/16 0820  BP: (!) 141/79   Pulse: (!) 35 67  Resp: 18 17  Temp:    SpO2: 97% 97%    Last Pain:  Vitals:   11/30/16 0619  TempSrc: Oral  PainSc:       Patients Stated Pain Goal: 2 (16/10/96 0454)  Complications: No apparent anesthesia complications

## 2016-11-30 NOTE — Anesthesia Preprocedure Evaluation (Addendum)
Anesthesia Evaluation  Patient identified by MRN, date of birth, ID band Patient awake    Reviewed: Allergy & Precautions, NPO status , Patient's Chart, lab work & pertinent test results  History of Anesthesia Complications (+) history of anesthetic complications  Airway Mallampati: II  TM Distance: >3 FB Neck ROM: Full    Dental no notable dental hx.    Pulmonary sleep apnea and Continuous Positive Airway Pressure Ventilation , former smoker,    breath sounds clear to auscultation- rhonchi       Cardiovascular hypertension, + CAD and + Past MI   Rhythm:Regular Rate:Normal     Neuro/Psych    GI/Hepatic negative GI ROS, Neg liver ROS,   Endo/Other  Morbid obesity  Renal/GU negative Renal ROS     Musculoskeletal   Abdominal (+) + obese,   Peds  Hematology   Anesthesia Other Findings   Reproductive/Obstetrics                            Anesthesia Physical Anesthesia Plan  ASA: III  Anesthesia Plan: General   Post-op Pain Management:  Regional for Post-op pain   Induction: Intravenous  PONV Risk Score and Plan: 3 and Ondansetron, Dexamethasone, Midazolam, Propofol infusion and Treatment may vary due to age or medical condition  Airway Management Planned: Oral ETT  Additional Equipment:   Intra-op Plan:   Post-operative Plan:   Informed Consent: I have reviewed the patients History and Physical, chart, labs and discussed the procedure including the risks, benefits and alternatives for the proposed anesthesia with the patient or authorized representative who has indicated his/her understanding and acceptance.   Dental advisory given  Plan Discussed with: CRNA  Anesthesia Plan Comments:         Anesthesia Quick Evaluation

## 2016-12-01 ENCOUNTER — Ambulatory Visit (INDEPENDENT_AMBULATORY_CARE_PROVIDER_SITE_OTHER): Payer: Medicare Other | Admitting: Otolaryngology

## 2016-12-01 ENCOUNTER — Encounter (HOSPITAL_COMMUNITY): Payer: Self-pay | Admitting: Orthopedic Surgery

## 2016-12-01 NOTE — Op Note (Signed)
NAMEMATTSON, DAYAL NO.:  [id number]  MEDICAL RECORD NO.:  [id number]  LOCATION:                                 FACILITY:  PHYSICIAN:  Ninetta Lights, M.D.      DATE OF BIRTH:  DATE OF PROCEDURE:  11/30/2016 DATE OF DISCHARGE:                              OPERATIVE REPORT   PREOPERATIVE DIAGNOSES:  Left shoulder impingement.  Partial cuff tear. Tendinopathy, long head biceps tendon.  Degenerative joint disease, acromioclavicular joint.  POSTOPERATIVE DIAGNOSES:  Left shoulder impingement.  Partial cuff tear. Tendinopathy, long head biceps tendon.  Degenerative joint disease, acromioclavicular joint with thinning throughout the cuff, but no full- thickness tears.  Near-complete rupture of biceps, large retained stump and strands of tendon.  PROCEDURE:  Left shoulder exam under anesthesia, arthroscopy, debridement release what was remained on the biceps.  Debridement of rotator cuff above and below.  Bursectomy, acromioplasty, CA ligament release.  Excision of distal clavicle.  SURGEON:  Ninetta Lights, M.D.  ASSISTANT:  Tawanna Cooler, PA, who was present throughout the entire case and necessary for timely completion of procedure.  ANESTHESIA:  General.  BLOOD LOSS:  Minimal.  SPECIMENS:  None.  CULTURES:  None.  COMPLICATIONS:  None.  DRESSINGS:  Sterile compressive sling.  DESCRIPTION OF PROCEDURE:  The patient was brought to the operating room and after adequate anesthesia had been obtained, shoulder examined. Little stiff.  I could get in through full motion.  Beach-chair position.  Prepped and draped in usual sterile fashion.  Three portals anterior, posterior, and lateral.  Arthroscope introduced, shoulder distended and inspected.  What was left of the biceps was debrided. Articular cartilage to look bad.  Little degeneration labrum debrided. Bottom of the cuff had attrition out in the crescent region, but I did not see a  full-thickness tears.  Cannula redirected subacromially.  A lot of bursitis resected.  Acromioplasty from type 3 to type 1 acromion releasing CA ligament.  A lot of abrasive changes on the top of the cuff, but again nothing full-thickness requiring repair.  Distal clavicle exposed, lateral centimeter resected.  Adequacy of decompression debridement confirmed viewing from all portals.  Instruments were fully removed. Portals were closed with nylon.  Sterile compressive dressing applied. Sling applied.  Anesthesia reversed.  Brought to the recovery room. Tolerated the surgery well.  No complications.     Ninetta Lights, M.D.   ______________________________ Ninetta Lights, M.D.    DFM/MEDQ  D:  12/01/2016  T:  12/01/2016  Job:  [id number]

## 2016-12-02 NOTE — Anesthesia Postprocedure Evaluation (Signed)
Anesthesia Post Note  Patient: [name]  Procedure(s) Performed: LEFT SHOULDER ARTHROSCOPY, DEBRIDEMENT, ACROMIOPLASTY WITH DISTAL CLAVICAL EXCISION, POSSIBLE ROTATOR CUFF REPAIR AND BICEP TENODESIS (Left Shoulder)     Patient location during evaluation: PACU Anesthesia Type: General and Regional Level of consciousness: awake and alert Pain management: pain level controlled Vital Signs Assessment: post-procedure vital signs reviewed and stable Respiratory status: spontaneous breathing, nonlabored ventilation, respiratory function stable and patient connected to nasal cannula oxygen Cardiovascular status: blood pressure returned to baseline and stable Postop Assessment: no apparent nausea or vomiting Anesthetic complications: no    Last Vitals:  Vitals:   11/30/16 1145 11/30/16 1154  BP:  122/81  Pulse: 63 64  Resp: 13 20  Temp:  36.6 C  SpO2: 92% 93%    Last Pain:  Vitals:   11/30/16 1154  TempSrc:   PainSc: 4                  Toya Palacios,JAMES TERRILL

## 2016-12-06 DIAGNOSIS — M19012 Primary osteoarthritis, left shoulder: Secondary | ICD-10-CM | POA: Diagnosis not present

## 2016-12-12 ENCOUNTER — Ambulatory Visit (HOSPITAL_COMMUNITY): Payer: Medicare Other | Attending: Orthopedic Surgery | Admitting: Specialist

## 2016-12-12 ENCOUNTER — Telehealth (HOSPITAL_COMMUNITY): Payer: Self-pay | Admitting: Specialist

## 2016-12-12 ENCOUNTER — Encounter (HOSPITAL_COMMUNITY): Payer: Self-pay | Admitting: Specialist

## 2016-12-12 DIAGNOSIS — M25612 Stiffness of left shoulder, not elsewhere classified: Secondary | ICD-10-CM | POA: Insufficient documentation

## 2016-12-12 DIAGNOSIS — M25512 Pain in left shoulder: Secondary | ICD-10-CM | POA: Diagnosis not present

## 2016-12-12 DIAGNOSIS — R29898 Other symptoms and signs involving the musculoskeletal system: Secondary | ICD-10-CM | POA: Diagnosis not present

## 2016-12-12 NOTE — Therapy (Signed)
[place], [name], [address] Phone: [phone]   Fax:  [phone]  Occupational Therapy Evaluation  Patient Details  Name: [name] MRN: [id number] Date of Birth: 1950-10-17 Referring Provider: Dr. Ninetta Lights   Encounter Date: 12/12/2016  OT End of Session - 12/12/16 1614    Visit Number  1    Number of Visits  12    Date for OT Re-Evaluation  01/23/17 mini reassess on 11/26   mini reassess on 11/26   Authorization Type  Medicare part b    Authorization Time Period  before 10th visit    Authorization - Visit Number  1    Authorization - Number of Visits  101    OT Start Time  1520    OT Stop Time  1600    OT Time Calculation (min)  40 min    Activity Tolerance  Patient tolerated treatment well    Behavior During Therapy  [facility] for tasks assessed/performed       Past Medical History:  Diagnosis Date  . Anxiety   . Articular cartilage disorder involving shoulder region, left 11/2016  . Bursitis of shoulder, left 11/2016  . Complication of anesthesia    hx. of heart rate dropping during 2 surgeries   (2004- back surgery and with Lithotrispy )  . Crohn's disease ([place])   . Depression   . Dyspnea    with exertion  . History of hepatitis    unknown type - 1970s  . History of kidney stones   . Hyperlipidemia    unable to tolerat statins  . Hypertension    states under control with meds., has been on med. since 1994  . Immature cataract of both eyes   . Limited joint range of motion    cervical spine  . Myocardial infarct ([place]) 09/13/1992  . Nonobstructive atherosclerosis of coronary artery   . Numbness in both hands   . Obesity   . Osteoarthritis 11/2016   left shoulder  . Rotator cuff tear, left 11/2016  . Sleep apnea    uses BiPAP, but having issues with mask not sealing   . Thyroid nodule     Past Surgical History:  Procedure Laterality Date  . ANAL FISTULOTOMY  2017  . BIOPSY  THYROID    . CARDIAC CATHETERIZATION  1994, 12/25/2007   a. PTCA alone in 1994 b. RCA CTO w/ L-R collaterals, 40-50% prox RCA, 20% prox LAD; EF 50-55%, inferoapical HK  . CARDIAC CATHETERIZATION  04/02/2001  . CARDIAC CATHETERIZATION  11/07/2000  . CARDIAC CATHETERIZATION  09/24/1996  . CARPAL TUNNEL RELEASE Right 08/28/2003  . CAUTERY OF TURBINATES  11/05/2003  . CYSTECTOMY    . KNEE ARTHROSCOPY    . LITHOTRIPSY    . LUMBAR FUSION  02/18/2002  . LUMBAR LAMINECTOMY  02/18/2002   L4-5  . NASAL SEPTUM SURGERY  11/05/2003  . PARTIAL NEPHRECTOMY Left    age 67  . SPLENECTOMY     age 40  . TRANSTHORACIC ECHOCARDIOGRAM  03/05/2009   EF >55%, normal LV wall thickness.    There were no vitals filed for this visit.  Subjective Assessment - 12/12/16 1615    Subjective   S:  I have had issues with my arms for years.     Pertinent History  Mr. Leichter reports chronic pain and decreased use of his non dominant left arm due to arthritis in his shoulder.  He had  surgery on 11/30/16 for left partial rotator cuff repair, subacromial decompression, distal clavicle excision and biceps tendon repair.  Mr. Coggeshall has been referred to occupational therapy for evaluation and treatment following the SAD/DCE protocol.      Special Tests  FOTO:  54.89    Patient Stated Goals  I want to move my arm more freely    Currently in Pain?  Yes    Pain Score  4     Pain Location  Shoulder    Pain Orientation  Left    Pain Descriptors / Indicators  Sore    Pain Type  Acute pain    Pain Onset  1 to 4 weeks ago    Pain Frequency  Intermittent    Aggravating Factors   movement above waist    Pain Relieving Factors  rest and heat    Effect of Pain on Daily Activities  decreased use of left arm        [facility] OT Assessment - 12/12/16 0001      Assessment   Diagnosis  Left Shoulder Partial Rotator Cuff Repair, SAD/DCE Biceps tendon Repair    Referring Provider  Dr. Ninetta Lights    Onset Date  11/30/16    Prior  Therapy  n/a      Precautions   Precautions  Shoulder    Type of Shoulder Precautions  progress with AA/ROM and A/ROM as tolerated      Restrictions   Weight Bearing Restrictions  No      Balance Screen   Has the patient fallen in the past 6 months  No    Has the patient had a decrease in activity level because of a fear of falling?   No    Is the patient reluctant to leave their home because of a fear of falling?   No      Home  Environment   Family/patient expects to be discharged to:  Private residence    Lives With  Spouse      Prior Function   Level of [place]  Retired    Leisure  enjoys restoring old cars      ADL   ADL comments  unable to use his left arm above waist height due to decreased mobility and increased pain.  unable to reach behind his back      Written Expression   Dominant Hand  Right      Vision - History   Baseline Vision  Wears glasses only for reading      Cognition   Overall Cognitive Status  Within Functional Limits for tasks assessed      Observation/Other Assessments   Focus on Therapeutic Outcomes (FOTO)   59/100      Sensation   Light Touch  -- left hand numb due to neck surgery (baseline)   left hand numb due to neck surgery (baseline)     Coordination   Gross Motor Movements are Fluid and Coordinated  Yes    Fine Motor Movements are Fluid and Coordinated  Yes      ROM / Strength   AROM / PROM / Strength  AROM;PROM;Strength      Palpation   Palpation comment  patient has minimal fascial restrictions in his left upper arm, shoulder, and scapular region       AROM   Overall AROM Comments  assessed in supine, external and internal rotation with shoulder adducted  AROM Assessment Site  Shoulder    Right/Left Shoulder  Left    Left Shoulder Flexion  110 Degrees    Left Shoulder ABduction  90 Degrees    Left Shoulder Internal Rotation  90 Degrees    Left Shoulder External Rotation  20 Degrees       PROM   PROM Assessment Site  Shoulder    Right/Left Shoulder  Left    Left Shoulder Flexion  120 Degrees    Left Shoulder ABduction  105 Degrees    Left Shoulder Internal Rotation  90 Degrees    Left Shoulder External Rotation  48 Degrees      Strength   Overall Strength Comments  not assessed due to recent surgery    Strength Assessment Site  Shoulder    Right/Left Shoulder  Left               OT Treatments/Exercises (OP) - 12/12/16 0001      Exercises   Exercises  Shoulder      Shoulder Exercises: Supine   Protraction  PROM;5 reps    Horizontal ABduction  PROM;5 reps    External Rotation  PROM;5 reps    Internal Rotation  PROM;5 reps    Flexion  PROM;5 reps    ABduction  PROM;5 reps      Manual Therapy   Manual Therapy  Myofascial release    Manual therapy comments  manual therapy interventions seperate from all other interventions    Myofascial Release  myofascial release and manual stretching to left upper arm, scapular, and shoulder region to decrease pain and improve mobility in left shoulder            OT Education - 12/12/16 1613    Education provided  Yes    Education Details  table slides     Person(s) Educated  Patient    Methods  Explanation;Demonstration;Handout    Comprehension  Verbalized understanding       OT Short Term Goals - 12/12/16 1621      OT SHORT TERM GOAL #1   Title  Patient will be educated on HEP for improved shoulder mobility.    Time  3    Period  Weeks    Status  New    Target Date  01/02/17      OT SHORT TERM GOAL #2   Title  Patient will improve left shoulder P/ROM to WNL for improved ability to don and doff shirts.    Time  3    Period  Weeks    Status  New      OT SHORT TERM GOAL #3   Title  Patient will improve left shoulder strength to 3+/5 for improved ability to lift car parts.    Time  3    Period  Weeks    Status  New      OT SHORT TERM GOAL #4   Title  Patient will decrease pain to 3/10 or better  in left shoulder during functional acitivities.     Time  3    Period  Weeks    Status  New        OT Long Term Goals - 12/12/16 1623      OT LONG TERM GOAL #1   Title  Patient will return to prior level of independence with all B/IADLs and leisure activities using left arm.    Time  6    Period  Weeks    Status  New    Target Date  01/23/17      OT LONG TERM GOAL #2   Title  Patient will improve left shoulder A/ROM to WNL for improved ability to reach overhead when working on cars.     Time  6    Period  Weeks    Status  New      OT LONG TERM GOAL #3   Title  Patient will improve left shoulder strength to 5/5 for improved ability to lift car parts and lawn equipment.     Time  6    Period  Weeks    Status  New      OT LONG TERM GOAL #4   Title  Patient will have 2/10 pain or less in left shoulder when completing functional activities.    Time  6    Period  Weeks    Status  New      OT LONG TERM GOAL #5   Title  Patient will have trace fascial restrictions in his left shoulder for greater mobility during functional activities.     Time  6    Period  Weeks    Status  New            Plan - 12/12/16 1616    Clinical Impression Statement  A:  Mr. Palau is a 66 year old male with past medical history significant for heart cathertization, bilateral carpal tunnel release, neck surgery, knee surgery that has been referred to occupational therapy for evaluation and treatment s/p left SAD/DCE, biceps repair, partial rotator cuff tear repair.  Patient is not able to lift his left arm above waist height to complete routine ADLS, IADLS, or work actiivities.     Occupational Profile and client history currently impacting functional performance  very motiviated, active    Occupational performance deficits (Please refer to evaluation for details):  ADL's;IADL's;Leisure;Social Participation    Rehab Potential  Good    Current Impairments/barriers affecting progress:  history of  numbness in left arm due to neck surgery     OT Frequency  2x / week    OT Duration  6 weeks    OT Treatment/Interventions  Self-care/ADL training;Cryotherapy;Electrical Stimulation;Moist Heat;Neuromuscular education;Therapeutic exercise;Iontophoresis;Ultrasound;Energy conservation;Manual Therapy;Therapeutic activities;Therapeutic exercises;Passive range of motion;Scar mobilization;Patient/family education    Plan  P:  Patient will benefit from skilled OT intervention to decrease pain and restrictions and improve pain free mobility in left upper arm and shoulder region in order to return to prior level of independence with daily activities.  Next session:  review plan of care, issue dowel rod exercises for HEP.  Begin AA/ROM in supine.     Clinical Decision Making  Limited treatment options, no task modification necessary    OT Home Exercise Plan  12/12/16:  towel slides    Consulted and Agree with Plan of Care  Patient       Patient will benefit from skilled therapeutic intervention in order to improve the following deficits and impairments:  Decreased range of motion, Decreased scar mobility, Decreased strength, Increased fascial restricitons, Impaired UE functional use, Increased muscle spasms  Visit Diagnosis: Acute pain of left shoulder - Plan: Ot plan of care cert/re-cert  Stiffness of left shoulder, not elsewhere classified - Plan: Ot plan of care cert/re-cert  Other symptoms and signs involving the musculoskeletal system - Plan: Ot plan of care cert/re-cert  G-Codes - 83/15/17 1625    Functional Assessment Tool Used (Outpatient only)  FOTO 59% independent, 41%  limited     Functional Limitation  Carrying, moving and handling objects    Carrying, Moving and Handling Objects Current Status [phone])  At least 40 percent but less than 60 percent impaired, limited or restricted    Carrying, Moving and Handling Objects Goal Status (F1102)  At least 1 percent but less than 20 percent impaired,  limited or restricted       Problem List Patient Active Problem List   Diagnosis Date Noted  . Cervical spinal stenosis 11/12/2015  . Crohn disease ([place]) 01/20/2015  . Rectal fistula 01/20/2015  . Noninfectious gastroenteritis, unspecified   . Rectal bleeding 10/21/2014  . Bowel habit changes 10/21/2014  . Obesity (BMI 30.0-34.9) 05/18/2014  . OSA (obstructive sleep apnea) 03/30/2013  . Chest pain 02/15/2013  . HTN (hypertension) 02/15/2013  . CAD (coronary artery disease) 02/15/2013  . Dyslipidemia 02/15/2013  . HAND, ARTHRITIS, DEGEN./OSTEO 12/01/2008  . TRIGGER FINGER 12/01/2008    Vangie Bicker, [place], OTR/L [phone]  12/12/2016, 4:36 PM  [facility] [address] [place], [name], [address] Phone: [phone]   Fax:  [phone]  Name: [name] MRN: [id number] Date of Birth: 03-16-1950

## 2016-12-12 NOTE — Patient Instructions (Signed)
SHOULDER: Flexion On Table   Place hands on table, elbows straight. Slide arms across table, leaning forward as you are able.  . Complete 1-3 minutes 3 times per day.  Abduction (Passive)   With arm out to side, resting on table, palm down, slide arm across table, leaning towards table as you are able. 1-3 minutes 3 times per day. Copyright  VHI. All rights reserved.     Internal Rotation (Assistive)   Seated with elbow bent at right angle and held against side, slide arm on table surface in an inward arc. Repeat __15__ times. Do ___3_ sessions per day. Activity: Use this motion to brush crumbs off the table.  Copyright  VHI. All rights reserved.

## 2016-12-12 NOTE — Telephone Encounter (Signed)
Spoke to [place] she will send urgent message to have referral signed. NF 12/12/16

## 2016-12-15 ENCOUNTER — Ambulatory Visit (HOSPITAL_COMMUNITY): Payer: Medicare Other

## 2016-12-15 ENCOUNTER — Encounter (HOSPITAL_COMMUNITY): Payer: Self-pay

## 2016-12-15 DIAGNOSIS — M25512 Pain in left shoulder: Secondary | ICD-10-CM

## 2016-12-15 DIAGNOSIS — M25612 Stiffness of left shoulder, not elsewhere classified: Secondary | ICD-10-CM | POA: Diagnosis not present

## 2016-12-15 DIAGNOSIS — R29898 Other symptoms and signs involving the musculoskeletal system: Secondary | ICD-10-CM | POA: Diagnosis not present

## 2016-12-15 NOTE — Therapy (Signed)
[place], [name], [address] Phone: [phone]   Fax:  [phone]  Occupational Therapy Treatment  Patient Details  Name: [name] MRN: [id number] Date of Birth: July 16, 1950 Referring Provider: Dr. Ninetta Lights   Encounter Date: 12/15/2016  OT End of Session - 12/15/16 0850    Visit Number  2    Number of Visits  12    Date for OT Re-Evaluation  01/23/17 mini reassess on 11/26   mini reassess on 11/26   Authorization Type  Medicare part b    Authorization Time Period  before 10th visit    Authorization - Visit Number  2    Authorization - Number of Visits  101    OT Start Time  0820    OT Stop Time  0901    OT Time Calculation (min)  41 min    Activity Tolerance  Patient tolerated treatment well    Behavior During Therapy  [facility] for tasks assessed/performed       Past Medical History:  Diagnosis Date  . Anxiety   . Articular cartilage disorder involving shoulder region, left 11/2016  . Bursitis of shoulder, left 11/2016  . Complication of anesthesia    hx. of heart rate dropping during 2 surgeries   (2004- back surgery and with Lithotrispy )  . Crohn's disease ([place])   . Depression   . Dyspnea    with exertion  . History of hepatitis    unknown type - 1970s  . History of kidney stones   . Hyperlipidemia    unable to tolerat statins  . Hypertension    states under control with meds., has been on med. since 1994  . Immature cataract of both eyes   . Limited joint range of motion    cervical spine  . Myocardial infarct ([place]) 09/13/1992  . Nonobstructive atherosclerosis of coronary artery   . Numbness in both hands   . Obesity   . Osteoarthritis 11/2016   left shoulder  . Rotator cuff tear, left 11/2016  . Sleep apnea    uses BiPAP, but having issues with mask not sealing   . Thyroid nodule     Past Surgical History:  Procedure Laterality Date  . ANAL FISTULOTOMY  2017  . BIOPSY  THYROID    . CARDIAC CATHETERIZATION  1994, 12/25/2007   a. PTCA alone in 1994 b. RCA CTO w/ L-R collaterals, 40-50% prox RCA, 20% prox LAD; EF 50-55%, inferoapical HK  . CARDIAC CATHETERIZATION  04/02/2001  . CARDIAC CATHETERIZATION  11/07/2000  . CARDIAC CATHETERIZATION  09/24/1996  . CARPAL TUNNEL RELEASE Right 08/28/2003  . CAUTERY OF TURBINATES  11/05/2003  . CYSTECTOMY    . KNEE ARTHROSCOPY    . LITHOTRIPSY    . LUMBAR FUSION  02/18/2002  . LUMBAR LAMINECTOMY  02/18/2002   L4-5  . NASAL SEPTUM SURGERY  11/05/2003  . PARTIAL NEPHRECTOMY Left    age 62  . SPLENECTOMY     age 51  . TRANSTHORACIC ECHOCARDIOGRAM  03/05/2009   EF >55%, normal LV wall thickness.    There were no vitals filed for this visit.  Subjective Assessment - 12/15/16 0845    Subjective   S: I worked my arm good the other day. Maybe too much. I'm building a jungle gym.    Currently in Pain?  Yes    Pain Score  4     Pain Location  Shoulder  Pain Orientation  Left    Pain Descriptors / Indicators  Aching    Pain Type  Acute pain         [facility] OT Assessment - 12/15/16 0849      Assessment   Diagnosis  Left Shoulder Partial Rotator Cuff Repair, SAD/DCE Biceps tendon Repair      Precautions   Precautions  Shoulder    Type of Shoulder Precautions  progress with AA/ROM and A/ROM as tolerated               OT Treatments/Exercises (OP) - 12/15/16 0849      Exercises   Exercises  Shoulder      Shoulder Exercises: Supine   Protraction  PROM;5 reps;AAROM;10 reps    Horizontal ABduction  PROM;5 reps;AAROM;10 reps    External Rotation  PROM;5 reps;AAROM;10 reps    Internal Rotation  PROM;5 reps;AAROM;10 reps    Flexion  PROM;5 reps;AROM;10 reps    ABduction  PROM;5 reps;AAROM;10 reps      Shoulder Exercises: Standing   Protraction  AAROM;10 reps    Horizontal ABduction  AAROM;10 reps    External Rotation  AAROM;10 reps    Internal Rotation  AAROM;10 reps    Flexion  AAROM;10 reps     ABduction  AAROM;10 reps      Manual Therapy   Manual Therapy  Myofascial release    Manual therapy comments  manual therapy interventions seperate from all other interventions    Myofascial Release  myofascial release and manual stretching to left upper arm, scapular, and shoulder region to decrease pain and improve mobility in left shoulder             OT Education - 12/15/16 0853    Education provided  Yes    Education Details  AA/ROM shoulder exercises. Pt may stop table slides.    Person(s) Educated  Patient    Methods  Explanation;Demonstration;Handout    Comprehension  Verbalized understanding;Returned demonstration       OT Short Term Goals - 12/15/16 0846      OT SHORT TERM GOAL #1   Title  Patient will be educated on HEP for improved shoulder mobility.    Time  3    Period  Weeks    Status  On-going      OT SHORT TERM GOAL #2   Title  Patient will improve left shoulder P/ROM to WNL for improved ability to don and doff shirts.    Time  3    Period  Weeks    Status  On-going      OT SHORT TERM GOAL #3   Title  Patient will improve left shoulder strength to 3+/5 for improved ability to lift car parts.    Time  3    Period  Weeks    Status  On-going      OT SHORT TERM GOAL #4   Title  Patient will decrease pain to 3/10 or better in left shoulder during functional acitivities.     Time  3    Period  Weeks    Status  On-going        OT Long Term Goals - 12/15/16 0847      OT LONG TERM GOAL #1   Title  Patient will return to prior level of independence with all B/IADLs and leisure activities using left arm.    Time  6    Period  Weeks    Status  On-going  OT LONG TERM GOAL #2   Title  Patient will improve left shoulder A/ROM to WNL for improved ability to reach overhead when working on cars.     Time  6    Period  Weeks    Status  On-going      OT LONG TERM GOAL #3   Title  Patient will improve left shoulder strength to 5/5 for improved  ability to lift car parts and lawn equipment.     Time  6    Period  Weeks    Status  On-going      OT LONG TERM GOAL #4   Title  Patient will have 2/10 pain or less in left shoulder when completing functional activities.    Time  6    Period  Weeks    Status  On-going      OT LONG TERM GOAL #5   Title  Patient will have trace fascial restrictions in his left shoulder for greater mobility during functional activities.     Time  6    Period  Weeks    Status  On-going            Plan - 12/15/16 0854    Clinical Impression Statement  A: Initiated myofascial release, manual stretching, AA/ROM supine and standing. patient was able to complete all exercises with some pain and discomfort. VC for form and technique. HEP was updated to include AA/ROM.     Plan  P: Add wall wash and PVC pipe slide.    OT Home Exercise Plan  12/12/16:  towel slides. 11/8: Stop table slides. Progress to AA/ROM.       Patient will benefit from skilled therapeutic intervention in order to improve the following deficits and impairments:  Decreased range of motion, Decreased scar mobility, Decreased strength, Increased fascial restricitons, Impaired UE functional use, Increased muscle spasms  Visit Diagnosis: Other symptoms and signs involving the musculoskeletal system  Stiffness of left shoulder, not elsewhere classified  Acute pain of left shoulder    Problem List Patient Active Problem List   Diagnosis Date Noted  . Cervical spinal stenosis 11/12/2015  . Crohn disease ([place]) 01/20/2015  . Rectal fistula 01/20/2015  . Noninfectious gastroenteritis, unspecified   . Rectal bleeding 10/21/2014  . Bowel habit changes 10/21/2014  . Obesity (BMI 30.0-34.9) 05/18/2014  . OSA (obstructive sleep apnea) 03/30/2013  . Chest pain 02/15/2013  . HTN (hypertension) 02/15/2013  . CAD (coronary artery disease) 02/15/2013  . Dyslipidemia 02/15/2013  . HAND, ARTHRITIS, DEGEN./OSTEO 12/01/2008  . TRIGGER  FINGER 12/01/2008   Ailene Ravel, OTR/L,CBIS  [phone]  12/15/2016, 9:35 AM  [place], [name], [address] Phone: [phone]   Fax:  [phone]  Name: [name] MRN: [id number] Date of Birth: 06-Aug-1950

## 2016-12-15 NOTE — Patient Instructions (Signed)

## 2016-12-19 ENCOUNTER — Ambulatory Visit (HOSPITAL_COMMUNITY): Payer: Medicare Other | Admitting: Specialist

## 2016-12-19 ENCOUNTER — Encounter (HOSPITAL_COMMUNITY): Payer: Self-pay | Admitting: Specialist

## 2016-12-19 DIAGNOSIS — R29898 Other symptoms and signs involving the musculoskeletal system: Secondary | ICD-10-CM

## 2016-12-19 DIAGNOSIS — M25512 Pain in left shoulder: Secondary | ICD-10-CM | POA: Diagnosis not present

## 2016-12-19 DIAGNOSIS — M25612 Stiffness of left shoulder, not elsewhere classified: Secondary | ICD-10-CM

## 2016-12-19 NOTE — Therapy (Signed)
[place], [name], [address] Phone: [phone]   Fax:  [phone]  Occupational Therapy Treatment  Patient Details  Name: [name] MRN: [id number] Date of Birth: 11-23-1950 Referring Provider: Dr. Ninetta Lights   Encounter Date: 12/19/2016  OT End of Session - 12/19/16 1225    Visit Number  3    Number of Visits  12    Date for OT Re-Evaluation  01/23/17 mini reassess on 01/02/17    Authorization Type  Medicare part b    Authorization Time Period  before 10th visit    Authorization - Visit Number  3    Authorization - Number of Visits  10    OT Start Time  [phone]    OT Stop Time  0945    OT Time Calculation (min)  40 min    Activity Tolerance  Patient tolerated treatment well    Behavior During Therapy  [facility] for tasks assessed/performed       Past Medical History:  Diagnosis Date  . Anxiety   . Articular cartilage disorder involving shoulder region, left 11/2016  . Bursitis of shoulder, left 11/2016  . Complication of anesthesia    hx. of heart rate dropping during 2 surgeries   (2004- back surgery and with Lithotrispy )  . Crohn's disease ([place])   . Depression   . Dyspnea    with exertion  . History of hepatitis    unknown type - 1970s  . History of kidney stones   . Hyperlipidemia    unable to tolerat statins  . Hypertension    states under control with meds., has been on med. since 1994  . Immature cataract of both eyes   . Limited joint range of motion    cervical spine  . Myocardial infarct ([place]) 09/13/1992  . Nonobstructive atherosclerosis of coronary artery   . Numbness in both hands   . Obesity   . Osteoarthritis 11/2016   left shoulder  . Rotator cuff tear, left 11/2016  . Sleep apnea    uses BiPAP, but having issues with mask not sealing   . Thyroid nodule     Past Surgical History:  Procedure Laterality Date  . ANAL FISTULOTOMY  2017  . BIOPSY THYROID    . CARDIAC  CATHETERIZATION  1994, 12/25/2007   a. PTCA alone in 1994 b. RCA CTO w/ L-R collaterals, 40-50% prox RCA, 20% prox LAD; EF 50-55%, inferoapical HK  . CARDIAC CATHETERIZATION  04/02/2001  . CARDIAC CATHETERIZATION  11/07/2000  . CARDIAC CATHETERIZATION  09/24/1996  . CARPAL TUNNEL RELEASE Right 08/28/2003  . CAUTERY OF TURBINATES  11/05/2003  . CYSTECTOMY    . KNEE ARTHROSCOPY    . LITHOTRIPSY    . LUMBAR FUSION  02/18/2002  . LUMBAR LAMINECTOMY  02/18/2002   L4-5  . NASAL SEPTUM SURGERY  11/05/2003  . PARTIAL NEPHRECTOMY Left    age 8  . SPLENECTOMY     age 49  . TRANSTHORACIC ECHOCARDIOGRAM  03/05/2009   EF >55%, normal LV wall thickness.    There were no vitals filed for this visit.  Subjective Assessment - 12/19/16 0906    Subjective   S:  The exercises help my shoulder.    Currently in Pain?  Yes    Pain Score  2     Pain Location  Shoulder    Pain Orientation  Left    Pain Descriptors / Indicators  Aching  Pain Onset  1 to 4 weeks ago    Pain Frequency  Intermittent    Aggravating Factors   movement above waist         [facility] OT Assessment - 12/19/16 0001      Assessment   Diagnosis  Left Shoulder Partial Rotator Cuff Repair, SAD/DCE Biceps tendon Repair      Precautions   Precautions  Shoulder    Type of Shoulder Precautions  progress with AA/ROM and A/ROM as tolerated               OT Treatments/Exercises (OP) - 12/19/16 0001      Exercises   Exercises  Shoulder      Shoulder Exercises: Supine   Protraction  PROM;5 reps;AAROM;12 reps    Horizontal ABduction  PROM;5 reps;AAROM;12 reps    External Rotation  PROM;5 reps;AAROM;12 reps    Internal Rotation  PROM;5 reps;AAROM;12 reps    Flexion  PROM;5 reps;AAROM;12 reps    ABduction  PROM;5 reps;AAROM;12 reps      Shoulder Exercises: Seated   Elevation  AROM;10 reps    Extension  AROM;10 reps    Row  AROM;10 reps      Shoulder Exercises: Standing   Protraction  AAROM;12 reps    Horizontal  ABduction  AAROM;12 reps    External Rotation  AAROM;12 reps    Internal Rotation  AAROM;12 reps    Flexion  AAROM;12 reps    ABduction  AAROM;12 reps      Shoulder Exercises: Pulleys   Flexion  1 minute    ABduction  1 minute      Shoulder Exercises: Therapy Ball   Flexion  15 reps    ABduction  15 reps    Right/Left  -- 3 each direction      Shoulder Exercises: ROM/Strengthening   Wall Wash  1'    Other ROM/Strengthening Exercises  pvc pipe slide 10 times into flexion and 10 times into abduction      Manual Therapy   Manual Therapy  Myofascial release    Manual therapy comments  manual therapy interventions seperate from all other interventions    Myofascial Release  myofascial release and manual stretching to left upper arm, scapular, and shoulder region to decrease pain and improve mobility in left shoulder               OT Short Term Goals - 12/15/16 0846      OT SHORT TERM GOAL #1   Title  Patient will be educated on HEP for improved shoulder mobility.    Time  3    Period  Weeks    Status  On-going      OT SHORT TERM GOAL #2   Title  Patient will improve left shoulder P/ROM to WNL for improved ability to don and doff shirts.    Time  3    Period  Weeks    Status  On-going      OT SHORT TERM GOAL #3   Title  Patient will improve left shoulder strength to 3+/5 for improved ability to lift car parts.    Time  3    Period  Weeks    Status  On-going      OT SHORT TERM GOAL #4   Title  Patient will decrease pain to 3/10 or better in left shoulder during functional acitivities.     Time  3    Period  Weeks    Status  On-going        OT Long Term Goals - 12/15/16 0847      OT LONG TERM GOAL #1   Title  Patient will return to prior level of independence with all B/IADLs and leisure activities using left arm.    Time  6    Period  Weeks    Status  On-going      OT LONG TERM GOAL #2   Title  Patient will improve left shoulder A/ROM to WNL for  improved ability to reach overhead when working on cars.     Time  6    Period  Weeks    Status  On-going      OT LONG TERM GOAL #3   Title  Patient will improve left shoulder strength to 5/5 for improved ability to lift car parts and lawn equipment.     Time  6    Period  Weeks    Status  On-going      OT LONG TERM GOAL #4   Title  Patient will have 2/10 pain or less in left shoulder when completing functional activities.    Time  6    Period  Weeks    Status  On-going      OT LONG TERM GOAL #5   Title  Patient will have trace fascial restrictions in his left shoulder for greater mobility during functional activities.     Time  6    Period  Weeks    Status  On-going            Plan - 12/19/16 1226    Clinical Impression Statement  A:  Patient with improved AA/ROM this date in supine and standing.  Added pvc pipe slide for flexion and abduction for improved range.  Added pulleys for improved range, as well.     Plan  P:  Increase to 2' with wall wash and improve ball circles to 5 repetitions each direction with less facilitation        Patient will benefit from skilled therapeutic intervention in order to improve the following deficits and impairments:  Decreased range of motion, Decreased scar mobility, Decreased strength, Increased fascial restricitons, Impaired UE functional use, Increased muscle spasms  Visit Diagnosis: Other symptoms and signs involving the musculoskeletal system  Stiffness of left shoulder, not elsewhere classified  Acute pain of left shoulder    Problem List Patient Active Problem List   Diagnosis Date Noted  . Cervical spinal stenosis 11/12/2015  . Crohn disease ([place]) 01/20/2015  . Rectal fistula 01/20/2015  . Noninfectious gastroenteritis, unspecified   . Rectal bleeding 10/21/2014  . Bowel habit changes 10/21/2014  . Obesity (BMI 30.0-34.9) 05/18/2014  . OSA (obstructive sleep apnea) 03/30/2013  . Chest pain 02/15/2013  . HTN  (hypertension) 02/15/2013  . CAD (coronary artery disease) 02/15/2013  . Dyslipidemia 02/15/2013  . HAND, ARTHRITIS, DEGEN./OSTEO 12/01/2008  . TRIGGER FINGER 12/01/2008    Vangie Bicker, [place], OTR/L [phone]  12/19/2016, 12:29 PM  [place], [name], [address] Phone: [phone]   Fax:  [phone]  Name: [name] MRN: [id number] Date of Birth: 11-10-1950

## 2016-12-20 DIAGNOSIS — H6123 Impacted cerumen, bilateral: Secondary | ICD-10-CM | POA: Diagnosis not present

## 2016-12-21 ENCOUNTER — Encounter (HOSPITAL_COMMUNITY): Payer: Self-pay

## 2016-12-21 ENCOUNTER — Ambulatory Visit (HOSPITAL_COMMUNITY): Payer: Medicare Other

## 2016-12-21 DIAGNOSIS — M25512 Pain in left shoulder: Secondary | ICD-10-CM | POA: Diagnosis not present

## 2016-12-21 DIAGNOSIS — R29898 Other symptoms and signs involving the musculoskeletal system: Secondary | ICD-10-CM | POA: Diagnosis not present

## 2016-12-21 DIAGNOSIS — M25612 Stiffness of left shoulder, not elsewhere classified: Secondary | ICD-10-CM | POA: Diagnosis not present

## 2016-12-21 NOTE — Therapy (Signed)
[place], [name], [address] Phone: [phone]   Fax:  [phone]  Occupational Therapy Treatment  Patient Details  Name: [name] MRN: [id number] Date of Birth: [birth year] Referring Provider: Dr. Ninetta Lights   Encounter Date: 12/21/2016  OT End of Session - 12/21/16 0944    Visit Number  4    Number of Visits  12    Date for OT Re-Evaluation  01/23/17 mini reassess on 01/02/17    Authorization Type  Medicare part b    Authorization Time Period  before 10th visit    Authorization - Visit Number  4    Authorization - Number of Visits  10    OT Start Time  0900    OT Stop Time  0940    OT Time Calculation (min)  40 min    Activity Tolerance  Patient tolerated treatment well    Behavior During Therapy  [facility] for tasks assessed/performed       Past Medical History:  Diagnosis Date  . Anxiety   . Articular cartilage disorder involving shoulder region, left 11/2016  . Bursitis of shoulder, left 11/2016  . Complication of anesthesia    hx. of heart rate dropping during 2 surgeries   (2004- back surgery and with Lithotrispy )  . Crohn's disease ([place])   . Depression   . Dyspnea    with exertion  . History of hepatitis    unknown type - 1970s  . History of kidney stones   . Hyperlipidemia    unable to tolerat statins  . Hypertension    states under control with meds., has been on med. since 1994  . Immature cataract of both eyes   . Limited joint range of motion    cervical spine  . Myocardial infarct ([place]) 09/13/1992  . Nonobstructive atherosclerosis of coronary artery   . Numbness in both hands   . Obesity   . Osteoarthritis 11/2016   left shoulder  . Rotator cuff tear, left 11/2016  . Sleep apnea    uses BiPAP, but having issues with mask not sealing   . Thyroid nodule     Past Surgical History:  Procedure Laterality Date  . ANAL FISTULOTOMY  2017  . BIOPSY THYROID    . CARDIAC  CATHETERIZATION  1994, 12/25/2007   a. PTCA alone in 1994 b. RCA CTO w/ L-R collaterals, 40-50% prox RCA, 20% prox LAD; EF 50-55%, inferoapical HK  . CARDIAC CATHETERIZATION  04/02/2001  . CARDIAC CATHETERIZATION  11/07/2000  . CARDIAC CATHETERIZATION  09/24/1996  . CARPAL TUNNEL RELEASE Right 08/28/2003  . CAUTERY OF TURBINATES  11/05/2003  . CYSTECTOMY    . KNEE ARTHROSCOPY    . LITHOTRIPSY    . LUMBAR FUSION  02/18/2002  . LUMBAR LAMINECTOMY  02/18/2002   L4-5  . NASAL SEPTUM SURGERY  11/05/2003  . PARTIAL NEPHRECTOMY Left    age 51  . SPLENECTOMY     age 66  . TRANSTHORACIC ECHOCARDIOGRAM  03/05/2009   EF >55%, normal LV wall thickness.    There were no vitals filed for this visit.  Subjective Assessment - 12/21/16 0919    Subjective   S: It's a little sore.    Currently in Pain?  Yes    Pain Score  4     Pain Location  Shoulder    Pain Orientation  Left    Pain Descriptors / Indicators  Aching  Pain Type  Acute pain         [facility] OT Assessment - 12/21/16 0919      Assessment   Diagnosis  Left Shoulder Partial Rotator Cuff Repair, SAD/DCE Biceps tendon Repair      Precautions   Precautions  Shoulder    Type of Shoulder Precautions  progress with AA/ROM and A/ROM as tolerated               OT Treatments/Exercises (OP) - 12/21/16 0919      Exercises   Exercises  Shoulder      Shoulder Exercises: Supine   Protraction  PROM;5 reps;AROM;12 reps    Horizontal ABduction  PROM;5 reps;AROM;10 reps    External Rotation  PROM;5 reps;AROM;10 reps    Internal Rotation  PROM;5 reps;AROM;10 reps    Flexion  PROM;5 reps;AROM;10 reps    ABduction  PROM;5 reps;AROM;10 reps      Shoulder Exercises: Standing   Protraction  AAROM;12 reps    Horizontal ABduction  AAROM;12 reps    External Rotation  AAROM;12 reps    Internal Rotation  AAROM;12 reps    Flexion  AAROM;12 reps    ABduction  AAROM;12 reps      Shoulder Exercises: ROM/Strengthening   Wall Wash  2'       Manual Therapy   Manual Therapy  Myofascial release    Manual therapy comments  manual therapy interventions seperate from all other interventions    Myofascial Release  myofascial release and manual stretching to left upper arm, scapular, and shoulder region to decrease pain and improve mobility in left shoulder               OT Short Term Goals - 12/15/16 0846      OT SHORT TERM GOAL #1   Title  Patient will be educated on HEP for improved shoulder mobility.    Time  3    Period  Weeks    Status  On-going      OT SHORT TERM GOAL #2   Title  Patient will improve left shoulder P/ROM to WNL for improved ability to don and doff shirts.    Time  3    Period  Weeks    Status  On-going      OT SHORT TERM GOAL #3   Title  Patient will improve left shoulder strength to 3+/5 for improved ability to lift car parts.    Time  3    Period  Weeks    Status  On-going      OT SHORT TERM GOAL #4   Title  Patient will decrease pain to 3/10 or better in left shoulder during functional acitivities.     Time  3    Period  Weeks    Status  On-going        OT Long Term Goals - 12/15/16 0847      OT LONG TERM GOAL #1   Title  Patient will return to prior level of independence with all B/IADLs and leisure activities using left arm.    Time  6    Period  Weeks    Status  On-going      OT LONG TERM GOAL #2   Title  Patient will improve left shoulder A/ROM to WNL for improved ability to reach overhead when working on cars.     Time  6    Period  Weeks    Status  On-going  OT LONG TERM GOAL #3   Title  Patient will improve left shoulder strength to 5/5 for improved ability to lift car parts and lawn equipment.     Time  6    Period  Weeks    Status  On-going      OT LONG TERM GOAL #4   Title  Patient will have 2/10 pain or less in left shoulder when completing functional activities.    Time  6    Period  Weeks    Status  On-going      OT LONG TERM GOAL #5   Title   Patient will have trace fascial restrictions in his left shoulder for greater mobility during functional activities.     Time  6    Period  Weeks    Status  On-going            Plan - 12/21/16 0944    Clinical Impression Statement  A: Increased wall wash to 2', therapy ball circles to 5 each direction with no faciliation from therapist and progressed to A/ROM supine. Patient was able to complete with some discomfort although completed all new exercises with VC for form and technique.    Plan  P: Continue wot progress to standing AA/ROM when able to tolerate. Add scapular theraband.       Patient will benefit from skilled therapeutic intervention in order to improve the following deficits and impairments:  Decreased range of motion, Decreased scar mobility, Decreased strength, Increased fascial restricitons, Impaired UE functional use, Increased muscle spasms  Visit Diagnosis: Other symptoms and signs involving the musculoskeletal system  Stiffness of left shoulder, not elsewhere classified  Acute pain of left shoulder    Problem List Patient Active Problem List   Diagnosis Date Noted  . Cervical spinal stenosis 11/12/2015  . Crohn disease ([place]) 01/20/2015  . Rectal fistula 01/20/2015  . Noninfectious gastroenteritis, unspecified   . Rectal bleeding 10/21/2014  . Bowel habit changes 10/21/2014  . Obesity (BMI 30.0-34.9) 05/18/2014  . OSA (obstructive sleep apnea) 03/30/2013  . Chest pain 02/15/2013  . HTN (hypertension) 02/15/2013  . CAD (coronary artery disease) 02/15/2013  . Dyslipidemia 02/15/2013  . HAND, ARTHRITIS, DEGEN./OSTEO 12/01/2008  . TRIGGER FINGER 12/01/2008   Ailene Ravel, OTR/L,CBIS  [phone]  12/21/2016, 9:48 AM  [place] [address] [place], [name], [address] Phone: [phone]   Fax:  [phone]  Name: GARREN GREENMAN MRN: [id number] Date of Birth: 1950/07/29

## 2016-12-26 ENCOUNTER — Ambulatory Visit (HOSPITAL_COMMUNITY): Payer: Medicare Other | Admitting: Specialist

## 2016-12-26 ENCOUNTER — Encounter (HOSPITAL_COMMUNITY): Payer: Self-pay | Admitting: Specialist

## 2016-12-26 DIAGNOSIS — R29898 Other symptoms and signs involving the musculoskeletal system: Secondary | ICD-10-CM | POA: Diagnosis not present

## 2016-12-26 DIAGNOSIS — M25612 Stiffness of left shoulder, not elsewhere classified: Secondary | ICD-10-CM

## 2016-12-26 DIAGNOSIS — M25512 Pain in left shoulder: Secondary | ICD-10-CM

## 2016-12-26 NOTE — Therapy (Signed)
[place], [name], [address] Phone: [phone]   Fax:  [phone]  Occupational Therapy Treatment  Patient Details  Name: [name] MRN: [id number] Date of Birth: 06/13/1950 Referring Provider: Dr. Ninetta Lights   Encounter Date: 12/26/2016  OT End of Session - 12/26/16 1250    Visit Number  5    Number of Visits  12    Date for OT Re-Evaluation  01/23/17 mini reassess on 01/02/17    Authorization Type  Medicare part b    Authorization Time Period  before 10th visit    Authorization - Visit Number  5    Authorization - Number of Visits  10    OT Start Time  1120    OT Stop Time  1159    OT Time Calculation (min)  39 min    Activity Tolerance  Patient tolerated treatment well    Behavior During Therapy  [facility] for tasks assessed/performed       Past Medical History:  Diagnosis Date  . Anxiety   . Articular cartilage disorder involving shoulder region, left 11/2016  . Bursitis of shoulder, left 11/2016  . Complication of anesthesia    hx. of heart rate dropping during 2 surgeries   (2004- back surgery and with Lithotrispy )  . Crohn's disease ([place])   . Depression   . Dyspnea    with exertion  . History of hepatitis    unknown type - 1970s  . History of kidney stones   . Hyperlipidemia    unable to tolerat statins  . Hypertension    states under control with meds., has been on med. since 1994  . Immature cataract of both eyes   . Limited joint range of motion    cervical spine  . Myocardial infarct ([place]) 09/13/1992  . Nonobstructive atherosclerosis of coronary artery   . Numbness in both hands   . Obesity   . Osteoarthritis 11/2016   left shoulder  . Rotator cuff tear, left 11/2016  . Sleep apnea    uses BiPAP, but having issues with mask not sealing   . Thyroid nodule     Past Surgical History:  Procedure Laterality Date  . ANAL FISTULOTOMY  2017  . ANAL FISTULOTOMY N/A 06/28/2013    Performed by [name], MD at [facility]  . ANTERIOR CERVICAL DECOMPRESSION/DISCECTOMY FUSION CERVICAL THREE- CERVICAL FOUR, CERVICAL FOUR- CERVICAL FIVE N/A 11/12/2015   Performed by Jovita Gamma, MD at [place]    . CARDIAC CATHETERIZATION  1994, 12/25/2007   a. PTCA alone in 1994 b. RCA CTO w/ L-R collaterals, 40-50% prox RCA, 20% prox LAD; EF 50-55%, inferoapical HK  . CARDIAC CATHETERIZATION  04/02/2001  . CARDIAC CATHETERIZATION  11/07/2000  . CARDIAC CATHETERIZATION  09/24/1996  . CARPAL TUNNEL RELEASE Right 08/28/2003  . CAUTERY OF TURBINATES  11/05/2003  . COLONOSCOPY N/A 11/06/2014   Performed by Daneil Dolin, MD at [place]  . CYSTECTOMY    . INCISION AND DRAINAGE ABSCESS Right 05/23/2012   Performed by [name], MD at [facility]  . KNEE ARTHROSCOPY    . LEFT CARPAL TUNNEL RELEASE Left 10/09/2014   Performed by [name], MD at [facility]  . LEFT SHOULDER ARTHROSCOPY, DEBRIDEMENT, ACROMIOPLASTY WITH DISTAL CLAVICAL EXCISION, POSSIBLE ROTATOR CUFF REPAIR AND BICEP TENODESIS Left 11/30/2016   Performed by Ninetta Lights, MD at [facility]  OR  . LITHOTRIPSY    . LUMBAR FUSION  02/18/2002  . LUMBAR LAMINECTOMY  02/18/2002   L4-5  . NASAL SEPTUM SURGERY  11/05/2003  . PARTIAL NEPHRECTOMY Left    age 23  . SPLENECTOMY     age 49  . TRANSTHORACIC ECHOCARDIOGRAM  03/05/2009   EF >55%, normal LV wall thickness.    There were no vitals filed for this visit.  Subjective Assessment - 12/26/16 1250    Subjective   S:  I am sore, but I have been getting up leaves.    Currently in Pain?  Yes    Pain Score  4     Pain Location  Shoulder    Pain Orientation  Left    Pain Descriptors / Indicators  Aching    Pain Type  Acute pain    Pain Onset  1 to 4 weeks ago    Pain Frequency  Intermittent    Aggravating Factors   extended use         [facility] OT Assessment - 12/26/16 0001      Assessment   Diagnosis  Left Shoulder Partial Rotator  Cuff Repair, SAD/DCE Biceps tendon Repair      Precautions   Precautions  Shoulder    Type of Shoulder Precautions  progress with AA/ROM and A/ROM as tolerated               OT Treatments/Exercises (OP) - 12/26/16 0001      Exercises   Exercises  Shoulder      Shoulder Exercises: Supine   Protraction  PROM;5 reps;AROM;15 reps    Horizontal ABduction  PROM;5 reps;AROM;15 reps    External Rotation  PROM;5 reps;AROM;15 reps    Internal Rotation  PROM;5 reps;AROM;15 reps    Flexion  PROM;5 reps;AROM;15 reps    ABduction  PROM;5 reps;AROM;15 reps      Shoulder Exercises: Standing   Protraction  AAROM;15 reps    Horizontal ABduction  AAROM;15 reps    External Rotation  AAROM;15 reps    Internal Rotation  AAROM;15 reps    Flexion  AAROM;15 reps    ABduction  AAROM;15 reps    Extension  Theraband;10 reps    Theraband Level (Shoulder Extension)  Level 2 (Red)    Row  Theraband;10 reps    Theraband Level (Shoulder Row)  Level 2 (Red)    Retraction  Theraband;10 reps    Theraband Level (Shoulder Retraction)  Level 2 (Red)      Shoulder Exercises: Therapy Ball   Right/Left  5 reps      Shoulder Exercises: ROM/Strengthening   UBE (Upper Arm Bike)  2' forward and 2' reverse at 1.0    Wall Wash  2'    Proximal Shoulder Strengthening, Supine  10 times    Proximal Shoulder Strengthening, Seated  10 times       Manual Therapy   Manual Therapy  Myofascial release    Manual therapy comments  manual therapy interventions seperate from all other interventions    Myofascial Release  myofascial release and manual stretching to left upper arm, scapular, and shoulder region to decrease pain and improve mobility in left shoulder               OT Short Term Goals - 12/15/16 0846      OT SHORT TERM GOAL #1   Title  Patient will be educated on HEP for improved shoulder mobility.    Time  3  Period  Weeks    Status  On-going      OT SHORT TERM GOAL #2   Title  Patient  will improve left shoulder P/ROM to WNL for improved ability to don and doff shirts.    Time  3    Period  Weeks    Status  On-going      OT SHORT TERM GOAL #3   Title  Patient will improve left shoulder strength to 3+/5 for improved ability to lift car parts.    Time  3    Period  Weeks    Status  On-going      OT SHORT TERM GOAL #4   Title  Patient will decrease pain to 3/10 or better in left shoulder during functional acitivities.     Time  3    Period  Weeks    Status  On-going        OT Long Term Goals - 12/15/16 0847      OT LONG TERM GOAL #1   Title  Patient will return to prior level of independence with all B/IADLs and leisure activities using left arm.    Time  6    Period  Weeks    Status  On-going      OT LONG TERM GOAL #2   Title  Patient will improve left shoulder A/ROM to WNL for improved ability to reach overhead when working on cars.     Time  6    Period  Weeks    Status  On-going      OT LONG TERM GOAL #3   Title  Patient will improve left shoulder strength to 5/5 for improved ability to lift car parts and lawn equipment.     Time  6    Period  Weeks    Status  On-going      OT LONG TERM GOAL #4   Title  Patient will have 2/10 pain or less in left shoulder when completing functional activities.    Time  6    Period  Weeks    Status  On-going      OT LONG TERM GOAL #5   Title  Patient will have trace fascial restrictions in his left shoulder for greater mobility during functional activities.     Time  6    Period  Weeks    Status  On-going            Plan - 12/26/16 1251    Clinical Impression Statement  A:  Added UBE and scapular theraband for improved scapular stability required for improved independence with overhead reaching .    Plan  P:  Attempt A/ROM in standing.         Patient will benefit from skilled therapeutic intervention in order to improve the following deficits and impairments:  Decreased range of motion, Decreased  scar mobility, Decreased strength, Increased fascial restricitons, Impaired UE functional use, Increased muscle spasms  Visit Diagnosis: Other symptoms and signs involving the musculoskeletal system  Stiffness of left shoulder, not elsewhere classified  Acute pain of left shoulder    Problem List Patient Active Problem List   Diagnosis Date Noted  . Cervical spinal stenosis 11/12/2015  . Crohn disease ([place]) 01/20/2015  . Rectal fistula 01/20/2015  . Noninfectious gastroenteritis, unspecified   . Rectal bleeding 10/21/2014  . Bowel habit changes 10/21/2014  . Obesity (BMI 30.0-34.9) 05/18/2014  . OSA (obstructive sleep apnea) 03/30/2013  . Chest pain 02/15/2013  .  HTN (hypertension) 02/15/2013  . CAD (coronary artery disease) 02/15/2013  . Dyslipidemia 02/15/2013  . HAND, ARTHRITIS, DEGEN./OSTEO 12/01/2008  . TRIGGER FINGER 12/01/2008    Vangie Bicker, [place], OTR/L [phone]  12/26/2016, 12:58 PM  [place], [name], [address] Phone: [phone]   Fax:  [phone]  Name: [name] MRN: [id number] Date of Birth: 10/28/1950

## 2016-12-27 ENCOUNTER — Ambulatory Visit (HOSPITAL_COMMUNITY): Payer: Medicare Other

## 2016-12-27 ENCOUNTER — Encounter (HOSPITAL_COMMUNITY): Payer: Self-pay

## 2016-12-27 DIAGNOSIS — M25512 Pain in left shoulder: Secondary | ICD-10-CM

## 2016-12-27 DIAGNOSIS — M25612 Stiffness of left shoulder, not elsewhere classified: Secondary | ICD-10-CM | POA: Diagnosis not present

## 2016-12-27 DIAGNOSIS — R29898 Other symptoms and signs involving the musculoskeletal system: Secondary | ICD-10-CM | POA: Diagnosis not present

## 2016-12-27 NOTE — Therapy (Signed)
[place], [name], [address] Phone: [phone]   Fax:  [phone]  Occupational Therapy Treatment  Patient Details  Name: [name] MRN: [id number] Date of Birth: 08-05-1950 Referring Provider: Dr. Ninetta Lights   Encounter Date: 12/27/2016  OT End of Session - 12/27/16 0948    Visit Number  6    Number of Visits  12    Date for OT Re-Evaluation  01/23/17 mini reassess on 01/02/17    Authorization Type  Medicare part b    Authorization Time Period  before 10th visit    Authorization - Visit Number  6    Authorization - Number of Visits  10    OT Start Time  [phone]    OT Stop Time  0945    OT Time Calculation (min)  35 min    Activity Tolerance  Patient tolerated treatment well    Behavior During Therapy  [facility] for tasks assessed/performed       Past Medical History:  Diagnosis Date  . Anxiety   . Articular cartilage disorder involving shoulder region, left 11/2016  . Bursitis of shoulder, left 11/2016  . Complication of anesthesia    hx. of heart rate dropping during 2 surgeries   (2004- back surgery and with Lithotrispy )  . Crohn's disease ([place])   . Depression   . Dyspnea    with exertion  . History of hepatitis    unknown type - 1970s  . History of kidney stones   . Hyperlipidemia    unable to tolerat statins  . Hypertension    states under control with meds., has been on med. since 1994  . Immature cataract of both eyes   . Limited joint range of motion    cervical spine  . Myocardial infarct ([place]) 09/13/1992  . Nonobstructive atherosclerosis of coronary artery   . Numbness in both hands   . Obesity   . Osteoarthritis 11/2016   left shoulder  . Rotator cuff tear, left 11/2016  . Sleep apnea    uses BiPAP, but having issues with mask not sealing   . Thyroid nodule     Past Surgical History:  Procedure Laterality Date  . ANAL FISTULOTOMY  2017  . ANAL FISTULOTOMY N/A 06/28/2013    Performed by [name], MD at [facility]  . ANTERIOR CERVICAL DECOMPRESSION/DISCECTOMY FUSION CERVICAL THREE- CERVICAL FOUR, CERVICAL FOUR- CERVICAL FIVE N/A 11/12/2015   Performed by Jovita Gamma, MD at [place]    . CARDIAC CATHETERIZATION  1994, 12/25/2007   a. PTCA alone in 1994 b. RCA CTO w/ L-R collaterals, 40-50% prox RCA, 20% prox LAD; EF 50-55%, inferoapical HK  . CARDIAC CATHETERIZATION  04/02/2001  . CARDIAC CATHETERIZATION  11/07/2000  . CARDIAC CATHETERIZATION  09/24/1996  . CARPAL TUNNEL RELEASE Right 08/28/2003  . CAUTERY OF TURBINATES  11/05/2003  . COLONOSCOPY N/A 11/06/2014   Performed by Daneil Dolin, MD at [place]  . CYSTECTOMY    . INCISION AND DRAINAGE ABSCESS Right 05/23/2012   Performed by [name], MD at [facility]  . KNEE ARTHROSCOPY    . LEFT CARPAL TUNNEL RELEASE Left 10/09/2014   Performed by [name], MD at [facility]  . LEFT SHOULDER ARTHROSCOPY, DEBRIDEMENT, ACROMIOPLASTY WITH DISTAL CLAVICAL EXCISION, POSSIBLE ROTATOR CUFF REPAIR AND BICEP TENODESIS Left 11/30/2016   Performed by Ninetta Lights, MD at [facility]  OR  . LITHOTRIPSY    . LUMBAR FUSION  02/18/2002  . LUMBAR LAMINECTOMY  02/18/2002   L4-5  . NASAL SEPTUM SURGERY  11/05/2003  . PARTIAL NEPHRECTOMY Left    age 75  . SPLENECTOMY     age 56  . TRANSTHORACIC ECHOCARDIOGRAM  03/05/2009   EF >55%, normal LV wall thickness.    There were no vitals filed for this visit.  Subjective Assessment - 12/27/16 0935    Subjective   S: [name] worked me yesterday.    Currently in Pain?  Yes    Pain Score  3     Pain Location  Shoulder    Pain Orientation  Left    Pain Descriptors / Indicators  Aching    Pain Type  Acute pain                   OT Treatments/Exercises (OP) - 12/27/16 0938      Exercises   Exercises  Shoulder      Shoulder Exercises: Supine   Protraction  PROM;5 reps;AROM;15 reps    Horizontal ABduction  PROM;5 reps;AROM;15  reps    External Rotation  PROM;5 reps;AROM;15 reps    Internal Rotation  PROM;5 reps;AROM;15 reps    Flexion  PROM;5 reps;AROM;15 reps    ABduction  PROM;5 reps;AROM;15 reps      Shoulder Exercises: Standing   Protraction  AROM;15 reps    Horizontal ABduction  AROM;15 reps    External Rotation  AROM;15 reps    Internal Rotation  AROM;15 reps    Flexion  AROM;15 reps    ABduction  AROM;15 reps      Shoulder Exercises: ROM/Strengthening   UBE (Upper Arm Bike)  2' forward and 2' reverse at 1.0      Manual Therapy   Manual Therapy  Myofascial release    Manual therapy comments  manual therapy interventions seperate from all other interventions    Myofascial Release  myofascial release and manual stretching to left upper arm, scapular, and shoulder region to decrease pain and improve mobility in left shoulder               OT Short Term Goals - 12/15/16 0846      OT SHORT TERM GOAL #1   Title  Patient will be educated on HEP for improved shoulder mobility.    Time  3    Period  Weeks    Status  On-going      OT SHORT TERM GOAL #2   Title  Patient will improve left shoulder P/ROM to WNL for improved ability to don and doff shirts.    Time  3    Period  Weeks    Status  On-going      OT SHORT TERM GOAL #3   Title  Patient will improve left shoulder strength to 3+/5 for improved ability to lift car parts.    Time  3    Period  Weeks    Status  On-going      OT SHORT TERM GOAL #4   Title  Patient will decrease pain to 3/10 or better in left shoulder during functional acitivities.     Time  3    Period  Weeks    Status  On-going        OT Long Term Goals - 12/15/16 0847      OT LONG TERM GOAL #1   Title  Patient will return to prior level  of independence with all B/IADLs and leisure activities using left arm.    Time  6    Period  Weeks    Status  On-going      OT LONG TERM GOAL #2   Title  Patient will improve left shoulder A/ROM to WNL for improved  ability to reach overhead when working on cars.     Time  6    Period  Weeks    Status  On-going      OT LONG TERM GOAL #3   Title  Patient will improve left shoulder strength to 5/5 for improved ability to lift car parts and lawn equipment.     Time  6    Period  Weeks    Status  On-going      OT LONG TERM GOAL #4   Title  Patient will have 2/10 pain or less in left shoulder when completing functional activities.    Time  6    Period  Weeks    Status  On-going      OT LONG TERM GOAL #5   Title  Patient will have trace fascial restrictions in his left shoulder for greater mobility during functional activities.     Time  6    Period  Weeks    Status  On-going            Plan - 12/27/16 0949    Clinical Impression Statement  A: Progressed to standing A/ROM this session. patient was able to complete all exercises with some discomfort. VC for form and technique as needed.     Plan  P: Reassessment. Resume missed exercises.       Patient will benefit from skilled therapeutic intervention in order to improve the following deficits and impairments:  Decreased range of motion, Decreased scar mobility, Decreased strength, Increased fascial restricitons, Impaired UE functional use, Increased muscle spasms  Visit Diagnosis: Other symptoms and signs involving the musculoskeletal system  Acute pain of left shoulder  Stiffness of left shoulder, not elsewhere classified    Problem List Patient Active Problem List   Diagnosis Date Noted  . Cervical spinal stenosis 11/12/2015  . Crohn disease ([place]) 01/20/2015  . Rectal fistula 01/20/2015  . Noninfectious gastroenteritis, unspecified   . Rectal bleeding 10/21/2014  . Bowel habit changes 10/21/2014  . Obesity (BMI 30.0-34.9) 05/18/2014  . OSA (obstructive sleep apnea) 03/30/2013  . Chest pain 02/15/2013  . HTN (hypertension) 02/15/2013  . CAD (coronary artery disease) 02/15/2013  . Dyslipidemia 02/15/2013  . HAND,  ARTHRITIS, DEGEN./OSTEO 12/01/2008  . TRIGGER FINGER 12/01/2008   Ailene Ravel, OTR/L,CBIS  [phone]  12/27/2016, 9:50 AM  [place] [address] [place], [name], [address] Phone: [phone]   Fax:  [phone]  Name: [name] MRN: [id number] Date of Birth: Oct 20, 1950

## 2016-12-31 ENCOUNTER — Emergency Department (HOSPITAL_COMMUNITY)
Admission: EM | Admit: 2016-12-31 | Discharge: 2016-12-31 | Disposition: A | Discharge status: Home / Self Care | Payer: Medicare Other | Attending: Emergency Medicine | Admitting: Emergency Medicine

## 2016-12-31 ENCOUNTER — Encounter (HOSPITAL_COMMUNITY): Payer: Self-pay | Admitting: Cardiology

## 2016-12-31 ENCOUNTER — Other Ambulatory Visit: Payer: Self-pay

## 2016-12-31 ENCOUNTER — Emergency Department (HOSPITAL_COMMUNITY): Payer: Medicare Other

## 2016-12-31 DIAGNOSIS — R42 Dizziness and giddiness: Secondary | ICD-10-CM | POA: Insufficient documentation

## 2016-12-31 DIAGNOSIS — I2510 Atherosclerotic heart disease of native coronary artery without angina pectoris: Secondary | ICD-10-CM | POA: Diagnosis not present

## 2016-12-31 DIAGNOSIS — I10 Essential (primary) hypertension: Secondary | ICD-10-CM | POA: Insufficient documentation

## 2016-12-31 DIAGNOSIS — Z87891 Personal history of nicotine dependence: Secondary | ICD-10-CM | POA: Insufficient documentation

## 2016-12-31 DIAGNOSIS — Z79899 Other long term (current) drug therapy: Secondary | ICD-10-CM | POA: Insufficient documentation

## 2016-12-31 LAB — COMPREHENSIVE METABOLIC PANEL
ALT: 23 U/L (ref 17–63)
AST: 23 U/L (ref 15–41)
Albumin: 4.1 g/dL (ref 3.5–5.0)
Alkaline Phosphatase: 106 U/L (ref 38–126)
Anion gap: 9 (ref 5–15)
BUN: 10 mg/dL (ref 6–20)
CO2: 26 mmol/L (ref 22–32)
Calcium: 9 mg/dL (ref 8.9–10.3)
Chloride: 104 mmol/L (ref 101–111)
Creatinine, Ser: 0.68 mg/dL (ref 0.61–1.24)
GFR calc Af Amer: >60 mL/min (ref >60)
GFR calc non Af Amer: >60 mL/min (ref >60)
Glucose, Bld: 102 mg/dL — ABNORMAL HIGH (ref 65–99)
Potassium: 3.7 mmol/L (ref 3.5–5.1)
Sodium: 139 mmol/L (ref 135–145)
Total Bilirubin: 0.6 mg/dL (ref 0.3–1.2)
Total Protein: 7.8 g/dL (ref 6.5–8.1)

## 2016-12-31 LAB — CBC
HCT: 48.8 % (ref 39.0–52.0)
Hemoglobin: 16 g/dL (ref 13.0–17.0)
MCH: 30.1 pg (ref 26.0–34.0)
MCHC: 32.8 g/dL (ref 30.0–36.0)
MCV: 91.7 fL (ref 78.0–100.0)
Platelets: 382 10*3/uL (ref 150–400)
RBC: 5.32 MIL/uL (ref 4.22–5.81)
RDW: 14.3 % (ref 11.5–15.5)
WBC: 6.3 10*3/uL (ref 4.0–10.5)

## 2016-12-31 LAB — URINALYSIS, ROUTINE W REFLEX MICROSCOPIC
Bilirubin Urine: NEGATIVE
Glucose, UA: NEGATIVE mg/dL
Hgb urine dipstick: NEGATIVE
Ketones, ur: NEGATIVE mg/dL
Leukocytes, UA: NEGATIVE
Nitrite: NEGATIVE
Protein, ur: NEGATIVE mg/dL
Specific Gravity, Urine: 1.011 (ref 1.005–1.030)
pH: 6 (ref 5.0–8.0)

## 2016-12-31 LAB — DIFFERENTIAL
Basophils Absolute: 0.1 10*3/uL (ref 0.0–0.1)
Basophils Relative: 1 %
Eosinophils Absolute: 0.3 10*3/uL (ref 0.0–0.7)
Eosinophils Relative: 5 %
Lymphocytes Relative: 36 %
Lymphs Abs: 2.3 10*3/uL (ref 0.7–4.0)
Monocytes Absolute: 0.8 10*3/uL (ref 0.1–1.0)
Monocytes Relative: 13 %
Neutro Abs: 2.8 10*3/uL (ref 1.7–7.7)
Neutrophils Relative %: 45 %

## 2016-12-31 LAB — ETHANOL: Alcohol, Ethyl (B): <10 mg/dL (ref <10)

## 2016-12-31 LAB — RAPID URINE DRUG SCREEN, HOSP PERFORMED
Amphetamines: NOT DETECTED
Barbiturates: NOT DETECTED
Benzodiazepines: POSITIVE — AB
Cocaine: NOT DETECTED
Opiates: NOT DETECTED
Tetrahydrocannabinol: NOT DETECTED

## 2016-12-31 LAB — APTT: aPTT: 29 seconds (ref 24–36)

## 2016-12-31 LAB — PROTIME-INR
INR: 0.93
Prothrombin Time: 12.4 seconds (ref 11.4–15.2)

## 2016-12-31 MED ORDER — ALPRAZOLAM 0.5 MG PO TABS: 0.5000 mg | ORAL_TABLET | Freq: Three times a day (TID) | ORAL | 0 refills | Status: DC | PRN

## 2016-12-31 MED ORDER — MECLIZINE HCL 12.5 MG PO TABS: 25.0000 mg | ORAL_TABLET | Freq: Three times a day (TID) | ORAL | 0 refills | Status: DC | PRN

## 2016-12-31 MED ORDER — MECLIZINE HCL 12.5 MG PO TABS
25.0000 mg | ORAL_TABLET | Freq: Once | ORAL | Status: AC
  Administered 2016-12-31: 25 mg via ORAL
  Filled 2016-12-31: qty 2

## 2016-12-31 MED ORDER — ONDANSETRON HCL 4 MG/2ML IJ SOLN
4.0000 mg | Freq: Once | INTRAMUSCULAR | Status: AC
  Administered 2016-12-31: 4 mg via INTRAVENOUS
  Filled 2016-12-31: qty 2

## 2016-12-31 NOTE — ED Provider Notes (Signed)
[facility] EMERGENCY DEPARTMENT Provider Note   CSN: [id number] Arrival date & time: 12/31/16  0809     History   Chief Complaint Chief Complaint  Patient presents with  . Dizziness    HPI [name] is a 66 y.o. male.  The history is provided by the patient and the spouse.  Dizziness  Quality:  Head spinning Severity:  Moderate Onset quality:  Unable to specify (woke around 1 am with mild sx, worse when woke this am) Duration:  8 hours Timing:  Constant Progression:  Worsening Chronicity:  New Context: head movement and standing up   Relieved by:  Being still Worsened by:  Movement Ineffective treatments:  None tried Associated symptoms: nausea   Associated symptoms: no chest pain, no headaches, no hearing loss, no palpitations, no shortness of breath, no syncope, no tinnitus, no vision changes, no vomiting and no weakness   Associated symptoms comment:  Reports chronic hoh, not worse today   Past Medical History:  Diagnosis Date  . Anxiety   . Articular cartilage disorder involving shoulder region, left 11/2016  . Bursitis of shoulder, left 11/2016  . Complication of anesthesia    hx. of heart rate dropping during 2 surgeries   (2004- back surgery and with Lithotrispy )  . Crohn's disease ([place])   . Depression   . Dyspnea    with exertion  . History of hepatitis    unknown type - 1970s  . History of kidney stones   . Hyperlipidemia    unable to tolerat statins  . Hypertension    states under control with meds., has been on med. since 1994  . Immature cataract of both eyes   . Limited joint range of motion    cervical spine  . Myocardial infarct ([place]) 09/13/1992  . Nonobstructive atherosclerosis of coronary artery   . Numbness in both hands   . Obesity   . Osteoarthritis 11/2016   left shoulder  . Rotator cuff tear, left 11/2016  . Sleep apnea    uses BiPAP, but having issues with mask not sealing   . Thyroid nodule     Patient Active Problem  List   Diagnosis Date Noted  . Cervical spinal stenosis 11/12/2015  . Crohn disease ([place]) 01/20/2015  . Rectal fistula 01/20/2015  . Noninfectious gastroenteritis, unspecified   . Rectal bleeding 10/21/2014  . Bowel habit changes 10/21/2014  . Obesity (BMI 30.0-34.9) 05/18/2014  . OSA (obstructive sleep apnea) 03/30/2013  . Chest pain 02/15/2013  . HTN (hypertension) 02/15/2013  . CAD (coronary artery disease) 02/15/2013  . Dyslipidemia 02/15/2013  . HAND, ARTHRITIS, DEGEN./OSTEO 12/01/2008  . TRIGGER FINGER 12/01/2008    Past Surgical History:  Procedure Laterality Date  . ANAL FISTULOTOMY N/A 06/28/2013   Procedure: ANAL FISTULOTOMY;  Surgeon: [name], MD;  Location: [facility];  Service: General;  Laterality: N/A;  . ANAL FISTULOTOMY  2017  . ANTERIOR CERVICAL DECOMP/DISCECTOMY FUSION N/A 11/12/2015   Procedure: ANTERIOR CERVICAL DECOMPRESSION/DISCECTOMY FUSION CERVICAL THREE- CERVICAL FOUR, CERVICAL FOUR- CERVICAL FIVE;  Surgeon: Jovita Gamma, MD;  Location: [place];  Service: Neurosurgery;  Laterality: N/A;  ANTERIOR CERVICAL DECOMPRESSION/DISCECTOMY FUSION C3-C4, C4-C5  . BIOPSY THYROID    . CARDIAC CATHETERIZATION  1994, 12/25/2007   a. PTCA alone in 1994 b. RCA CTO w/ L-R collaterals, 40-50% prox RCA, 20% prox LAD; EF 50-55%, inferoapical HK  . CARDIAC CATHETERIZATION  04/02/2001  . CARDIAC CATHETERIZATION  11/07/2000  . CARDIAC CATHETERIZATION  09/24/1996  .  CARPAL TUNNEL RELEASE Left 10/09/2014   Procedure: LEFT CARPAL TUNNEL RELEASE;  Surgeon: [name], MD;  Location: [place];  Service: Orthopedics;  Laterality: Left;  . CARPAL TUNNEL RELEASE Right 08/28/2003  . CAUTERY OF TURBINATES  11/05/2003  . COLONOSCOPY N/A 11/06/2014   RMR: Skipped Colonic ulcerations with significant ileocecal valve involvement and rectal sparing most consisitant with Crohns disease. Minimal non-steroidal drug use. Probable colonic lipoma  . CYSTECTOMY    . INCISION AND  DRAINAGE ABSCESS Right 05/23/2012   Procedure: INCISION AND DRAINAGE ABSCESS;  Surgeon: [name], MD;  Location: [facility];  Service: General;  Laterality: Right;  . KNEE ARTHROSCOPY    . LITHOTRIPSY    . LUMBAR FUSION  02/18/2002  . LUMBAR LAMINECTOMY  02/18/2002   L4-5  . NASAL SEPTUM SURGERY  11/05/2003  . PARTIAL NEPHRECTOMY Left    age 31  . SHOULDER ARTHROSCOPY WITH SUBACROMIAL DECOMPRESSION, ROTATOR CUFF REPAIR AND BICEP TENDON REPAIR Left 11/30/2016   Procedure: LEFT SHOULDER ARTHROSCOPY, DEBRIDEMENT, ACROMIOPLASTY WITH DISTAL CLAVICAL EXCISION, POSSIBLE ROTATOR CUFF REPAIR AND BICEP TENODESIS;  Surgeon: Ninetta Lights, MD;  Location: [place];  Service: Orthopedics;  Laterality: Left;  . SPLENECTOMY     age 25  . TRANSTHORACIC ECHOCARDIOGRAM  03/05/2009   EF >55%, normal LV wall thickness.       Home Medications    Prior to Admission medications   Medication Sig Start Date End Date Taking? Authorizing Provider  amLODipine (NORVASC) 5 MG tablet Take 1 tablet (5 mg total) by mouth daily. 03/28/13  Yes Troy Sine, MD  carbamide peroxide (DEBROX) 6.5 % OTIC solution Place 5 drops into both ears 2 (two) times daily as needed (FOR EAR WAX REMOVAL).   Yes [provider]  metoprolol succinate (TOPROL-XL) 50 MG 24 hr tablet Take 50 mg by mouth daily. Take with or immediately following a meal.   Yes [provider]  nitroGLYCERIN (NITROSTAT) 0.4 MG SL tablet Place 0.4 mg under the tongue every 5 (five) minutes as needed for chest pain. Reported on 07/16/2015   Yes [provider]  Probiotic Product (PROBIOTIC DAILY PO) Take 1 capsule by mouth daily. SUPREMA DOPHILUS   Yes [provider]  Psyllium Husk POWD Take 12 g by mouth daily. INTO 12 OUNCES OF WATER (1 TABLESPOON)   Yes [provider]  ramipril (ALTACE) 10 MG capsule Take 10 mg by mouth daily.   Yes [provider]  ALPRAZolam (XANAX) 0.5 MG tablet Take 1 tablet (0.5 mg  total) by mouth 3 (three) times daily as needed (dizziness). 12/31/16   Evalee Jefferson, PA-C  meclizine (ANTIVERT) 12.5 MG tablet Take 2 tablets (25 mg total) by mouth 3 (three) times daily as needed for dizziness. 12/31/16   Evalee Jefferson, PA-C  ondansetron (ZOFRAN) 4 MG tablet Take 1 tablet (4 mg total) by mouth every 8 (eight) hours as needed for nausea or vomiting. Patient not taking: Reported on 12/12/2016 11/30/16   Aundra Dubin, PA-C  oxyCODONE-acetaminophen (ROXICET) 5-325 MG tablet Take 1-2 tablets by mouth every 4 (four) hours as needed. Patient not taking: Reported on 12/12/2016 11/30/16   Aundra Dubin, PA-C    Family History Family History  Problem Relation Age of Onset  . CAD Father        CABG   . CAD Brother        CABG in 59s  . CAD Brother   . Arthritis/Rheumatoid Mother   .  Hyperlipidemia Mother   . Thrombocytopenia Mother   . COPD Son        smoker  . Alzheimer's disease Maternal Grandmother   . Lung disease Paternal Grandfather     Social History Social History   Tobacco Use  . Smoking status: Former Smoker    Packs/day: 0.00    Years: 18.00    Pack years: 0.00    Last attempt to quit: 02/06/1993    Years since quitting: 23.9  . Smokeless tobacco: Former Network engineer Use Topics  . Alcohol use: No    Alcohol/week: 0.0 oz  . Drug use: No     Allergies   Niacin and related; Penicillins; and Statins   Review of Systems Review of Systems  Constitutional: Negative for fever.  HENT: Negative for congestion, hearing loss, sore throat and tinnitus.   Eyes: Negative.   Respiratory: Negative for chest tightness and shortness of breath.   Cardiovascular: Negative for chest pain, palpitations, leg swelling and syncope.  Gastrointestinal: Positive for nausea. Negative for abdominal pain and vomiting.  Genitourinary: Negative.   Musculoskeletal: Negative for arthralgias, joint swelling and neck pain.  Skin: Negative.  Negative for rash and wound.    Neurological: Positive for dizziness. Negative for weakness, light-headedness, numbness and headaches.  Psychiatric/Behavioral: Negative.      Physical Exam Updated Vital Signs BP (!) 144/84   Pulse 73   Temp 98.3 F (36.8 C)   Resp 20   Ht 5\' 9"  (1.753 m)   Wt 104.3 kg (230 lb)   SpO2 95%   BMI 33.97 kg/m   Physical Exam  Constitutional: He appears well-developed and well-nourished.  HENT:  Head: Normocephalic and atraumatic.  Right Ear: Tympanic membrane normal.  Left Ear: Tympanic membrane normal.  Cerumen bilateral TM's, no impaction.  Eyes: Conjunctivae are normal. Pupils are equal, round, and reactive to light. Right eye exhibits abnormal extraocular motion and nystagmus. Left eye exhibits abnormal extraocular motion and nystagmus.  Cardiovascular: Normal rate, regular rhythm, normal heart sounds and intact distal pulses.  Pulmonary/Chest: Effort normal and breath sounds normal. He has no wheezes.  Abdominal: Soft. Bowel sounds are normal. He exhibits no mass. There is no tenderness. There is no guarding.  Musculoskeletal: Normal range of motion.  Neurological: He is alert. He has normal strength. He displays no tremor.  Negative pronator drift. Equal grip strength. Normal heel shin.  Skin: Skin is warm and dry.  Psychiatric: He has a normal mood and affect.  Nursing note and vitals reviewed.    ED Treatments / Results  Labs (all labs ordered are listed, but only abnormal results are displayed) Labs Reviewed  COMPREHENSIVE METABOLIC PANEL - Abnormal; Notable for the following components:      Result Value   Glucose, Bld 102 (*)    All other components within normal limits  RAPID URINE DRUG SCREEN, HOSP PERFORMED - Abnormal; Notable for the following components:   Benzodiazepines POSITIVE (*)    All other components within normal limits  URINALYSIS, ROUTINE W REFLEX MICROSCOPIC - Abnormal; Notable for the following components:   Color, Urine STRAW (*)    All  other components within normal limits  ETHANOL  PROTIME-INR  APTT  CBC  DIFFERENTIAL  I-STAT CHEM 8, ED  I-STAT TROPONIN, ED   I-STAT 8 and i-STAT troponin also completed and negative. EKG  EKG Interpretation  Date/Time:  Saturday December 31 2016 08:27:53 EST Ventricular Rate:  81 PR Interval:    QRS Duration:  109 QT Interval:  410 QTC Calculation: 476 R Axis:   72 Text Interpretation:  Sinus rhythm Borderline prolonged PR interval Abnormal inferior Q waves Borderline prolonged QT interval No significant change since last tracing Abnormal ekg Confirmed by Carmin Muskrat [phone]) on 12/31/2016 8:54:03 AM       Radiology Ct Head Wo Contrast  Result Date: 12/31/2016 CLINICAL DATA:  Dizziness. EXAM: CT HEAD WITHOUT CONTRAST TECHNIQUE: Contiguous axial images were obtained from the base of the skull through the vertex without intravenous contrast. COMPARISON:  None. FINDINGS: Brain: No evidence of acute infarction, hemorrhage, hydrocephalus, extra-axial collection or mass lesion/mass effect. Vascular: No hyperdense vessel or unexpected calcification. Skull: Normal. Negative for fracture or focal lesion. Sinuses/Orbits: No acute finding. Other: None. IMPRESSION: Normal head CT. Electronically Signed   By: Marijo Conception, M.D.   On: 12/31/2016 11:43    Procedures Procedures (including critical care time)  Medications Ordered in ED Medications  ondansetron (ZOFRAN) injection 4 mg (4 mg Intravenous Given 12/31/16 0916)  meclizine (ANTIVERT) tablet 25 mg (25 mg Oral Given 12/31/16 0909)     Initial Impression / Assessment and Plan / ED Course  I have reviewed the triage vital signs and the nursing notes.  Pertinent labs & imaging results that were available during my care of the patient were reviewed by me and considered in my medical decision making (see chart for details).     Patient was given meclizine with improvement but not complete resolution of his symptoms.  He was  ambulated in the department and had a steady gait.  Exam and negative CT imaging and response to meclizine are reassuring that this is peripheral vertigo.  He was placed on additional meclizine for home use.  He currently takes Xanax as needed nightly to help with sleep.  This was adjusted to take Xanax 3 times daily as needed dizziness and a new prescription was given for this dosing.  He was also given information regarding Epley maneuvers which he may try at home.  Advised that he needs a recheck by his primary doctor in 1 week if his symptoms persist, sooner for any worsening symptoms.  He and his wife understand and agree with today's plan.  Patient was seen by Dr. Vanita Panda during this visit.  Final Clinical Impressions(s) / ED Diagnoses   Final diagnoses:  Vertigo    ED Discharge Orders        Ordered    meclizine (ANTIVERT) 12.5 MG tablet  3 times daily PRN     12/31/16 1236    ALPRAZolam (XANAX) 0.5 MG tablet  3 times daily PRN     12/31/16 1236       Evalee Jefferson, PA-C 12/31/16 1241    Carmin Muskrat, MD 12/31/16 1529

## 2016-12-31 NOTE — Discharge Instructions (Signed)
Use the medicines prescribed for your symptoms as discussed.

## 2016-12-31 NOTE — ED Notes (Signed)
Pt denies dizziness at this time.

## 2016-12-31 NOTE — ED Triage Notes (Signed)
Woke up this morning dizzy.  C/o nausea.

## 2016-12-31 NOTE — ED Notes (Signed)
Ambulated pt. Pt had steady gate, but still complaining of spinning, almost light headed, feelings.

## 2017-01-02 ENCOUNTER — Encounter (HOSPITAL_COMMUNITY): Payer: Self-pay | Admitting: Specialist

## 2017-01-02 ENCOUNTER — Other Ambulatory Visit: Payer: Self-pay

## 2017-01-02 ENCOUNTER — Ambulatory Visit (HOSPITAL_COMMUNITY): Payer: Medicare Other | Admitting: Specialist

## 2017-01-02 DIAGNOSIS — R29898 Other symptoms and signs involving the musculoskeletal system: Secondary | ICD-10-CM

## 2017-01-02 DIAGNOSIS — M25612 Stiffness of left shoulder, not elsewhere classified: Secondary | ICD-10-CM

## 2017-01-02 DIAGNOSIS — M25512 Pain in left shoulder: Secondary | ICD-10-CM | POA: Diagnosis not present

## 2017-01-02 LAB — I-STAT CHEM 8, ED
BUN: 9 mg/dL (ref 6–20)
Calcium, Ion: 1.14 mmol/L — ABNORMAL LOW (ref 1.15–1.40)
Chloride: 103 mmol/L (ref 101–111)
Creatinine, Ser: 0.7 mg/dL (ref 0.61–1.24)
Glucose, Bld: 104 mg/dL — ABNORMAL HIGH (ref 65–99)
HCT: 48 % (ref 39.0–52.0)
Hemoglobin: 16.3 g/dL (ref 13.0–17.0)
Potassium: 3.7 mmol/L (ref 3.5–5.1)
Sodium: 143 mmol/L (ref 135–145)
TCO2: 26 mmol/L (ref 22–32)

## 2017-01-02 LAB — I-STAT TROPONIN, ED: Troponin i, poc: 0 ng/mL (ref 0.00–0.08)

## 2017-01-02 NOTE — Therapy (Signed)
[place], [name], [address] Phone: [phone]   Fax:  [phone]  Occupational Therapy Treatment  Patient Details  Name: [name] MRN: [id number] Date of Birth: 12-20-1950 Referring Provider: Dr. Ninetta Lights   Encounter Date: 01/02/2017  OT End of Session - 01/02/17 1133    Visit Number  7    Number of Visits  12    Date for OT Re-Evaluation  01/23/17    Authorization Type  Medicare part b    Authorization Time Period  before 17th visit    Authorization - Visit Number  7    Authorization - Number of Visits  17    OT Start Time  0908    OT Stop Time  0955    OT Time Calculation (min)  47 min    Activity Tolerance  Patient tolerated treatment well    Behavior During Therapy  [facility] for tasks assessed/performed       Past Medical History:  Diagnosis Date  . Anxiety   . Articular cartilage disorder involving shoulder region, left 11/2016  . Bursitis of shoulder, left 11/2016  . Complication of anesthesia    hx. of heart rate dropping during 2 surgeries   (2004- back surgery and with Lithotrispy )  . Crohn's disease ([place])   . Depression   . Dyspnea    with exertion  . History of hepatitis    unknown type - 1970s  . History of kidney stones   . Hyperlipidemia    unable to tolerat statins  . Hypertension    states under control with meds., has been on med. since 1994  . Immature cataract of both eyes   . Limited joint range of motion    cervical spine  . Myocardial infarct ([place]) 09/13/1992  . Nonobstructive atherosclerosis of coronary artery   . Numbness in both hands   . Obesity   . Osteoarthritis 11/2016   left shoulder  . Rotator cuff tear, left 11/2016  . Sleep apnea    uses BiPAP, but having issues with mask not sealing   . Thyroid nodule     Past Surgical History:  Procedure Laterality Date  . ANAL FISTULOTOMY N/A 06/28/2013   Procedure: ANAL FISTULOTOMY;  Surgeon: [name], MD;  Location: [facility];  Service: General;  Laterality: N/A;  . ANAL FISTULOTOMY  2017  . ANTERIOR CERVICAL DECOMP/DISCECTOMY FUSION N/A 11/12/2015   Procedure: ANTERIOR CERVICAL DECOMPRESSION/DISCECTOMY FUSION CERVICAL THREE- CERVICAL FOUR, CERVICAL FOUR- CERVICAL FIVE;  Surgeon: Jovita Gamma, MD;  Location: [place];  Service: Neurosurgery;  Laterality: N/A;  ANTERIOR CERVICAL DECOMPRESSION/DISCECTOMY FUSION C3-C4, C4-C5  . BIOPSY THYROID    . CARDIAC CATHETERIZATION  1994, 12/25/2007   a. PTCA alone in 1994 b. RCA CTO w/ L-R collaterals, 40-50% prox RCA, 20% prox LAD; EF 50-55%, inferoapical HK  . CARDIAC CATHETERIZATION  04/02/2001  . CARDIAC CATHETERIZATION  11/07/2000  . CARDIAC CATHETERIZATION  09/24/1996  . CARPAL TUNNEL RELEASE Left 10/09/2014   Procedure: LEFT CARPAL TUNNEL RELEASE;  Surgeon: [name], MD;  Location: [place];  Service: Orthopedics;  Laterality: Left;  . CARPAL TUNNEL RELEASE Right 08/28/2003  . CAUTERY OF TURBINATES  11/05/2003  . COLONOSCOPY N/A 11/06/2014   RMR: Skipped Colonic ulcerations with significant ileocecal valve involvement and rectal sparing most consisitant with Crohns disease. Minimal non-steroidal drug use. Probable colonic lipoma  . CYSTECTOMY    . INCISION AND DRAINAGE  ABSCESS Right 05/23/2012   Procedure: INCISION AND DRAINAGE ABSCESS;  Surgeon: [name], MD;  Location: [facility];  Service: General;  Laterality: Right;  . KNEE ARTHROSCOPY    . LITHOTRIPSY    . LUMBAR FUSION  02/18/2002  . LUMBAR LAMINECTOMY  02/18/2002   L4-5  . NASAL SEPTUM SURGERY  11/05/2003  . PARTIAL NEPHRECTOMY Left    age 1  . SHOULDER ARTHROSCOPY WITH SUBACROMIAL DECOMPRESSION, ROTATOR CUFF REPAIR AND BICEP TENDON REPAIR Left 11/30/2016   Procedure: LEFT SHOULDER ARTHROSCOPY, DEBRIDEMENT, ACROMIOPLASTY WITH DISTAL CLAVICAL EXCISION, POSSIBLE ROTATOR CUFF REPAIR AND BICEP TENODESIS;  Surgeon: Ninetta Lights, MD;  Location: [place];  Service:  Orthopedics;  Laterality: Left;  . SPLENECTOMY     age 29  . TRANSTHORACIC ECHOCARDIOGRAM  03/05/2009   EF >55%, normal LV wall thickness.    There were no vitals filed for this visit.  Subjective Assessment - 01/02/17 1132    Subjective   S:  I can push myself up easier and get out of bed easier now.    Special Tests  FOTO improved from 54.89 to 62    Currently in Pain?  Yes    Pain Score  4     Pain Location  Shoulder    Pain Orientation  Left    Pain Descriptors / Indicators  Aching    Pain Type  Acute pain    Pain Onset  1 to 4 weeks ago    Pain Frequency  Intermittent    Aggravating Factors   use, holding grandbaby    Pain Relieving Factors  rest and heat    Effect of Pain on Daily Activities  decreased use of left arm         [facility] OT Assessment - 01/02/17 0001      Assessment   Diagnosis  Left Shoulder Partial Rotator Cuff Repair, SAD/DCE Biceps tendon Repair      Precautions   Precautions  Shoulder    Type of Shoulder Precautions  progress with AA/ROM and A/ROM as tolerated      Restrictions   Weight Bearing Restrictions  No      Observation/Other Assessments   Focus on Therapeutic Outcomes (FOTO)   62/100      AROM   Overall AROM Comments  assessed in supine, external and internal rotation with shoulder adducted(12/12/16)    Left Shoulder Flexion  150 Degrees 110    Left Shoulder ABduction  160 Degrees 90    Left Shoulder Internal Rotation  90 Degrees 90    Left Shoulder External Rotation  35 Degrees 20      PROM   Left Shoulder Flexion  168 Degrees 120    Left Shoulder ABduction  175 Degrees 105    Left Shoulder Internal Rotation  90 Degrees 90    Left Shoulder External Rotation  60 Degrees 48      Strength   Right/Left Shoulder  Left    Left Shoulder Flexion  4-/5    Left Shoulder ABduction  4-/5    Left Shoulder Internal Rotation  4-/5    Left Shoulder External Rotation  4-/5               OT Treatments/Exercises (OP) - 01/02/17 0001       Exercises   Exercises  Shoulder      Shoulder Exercises: Supine   Protraction  PROM;5 reps;AROM;15 reps    Horizontal ABduction  PROM;5 reps;AROM;15 reps  External Rotation  PROM;5 reps;AROM;15 reps    Internal Rotation  PROM;5 reps;AROM;15 reps    Flexion  PROM;5 reps;AROM;15 reps    ABduction  PROM;5 reps;AROM;15 reps      Shoulder Exercises: Standing   Protraction  AROM;15 reps    Horizontal ABduction  AROM;15 reps    External Rotation  AROM;15 reps;Theraband    Theraband Level (Shoulder External Rotation)  Level 2 (Red)    Internal Rotation  AROM;15 reps;Theraband    Theraband Level (Shoulder Internal Rotation)  Level 2 (Red)    Flexion  AROM;15 reps    ABduction  AROM;15 reps    Extension  Theraband;15 reps    Theraband Level (Shoulder Extension)  Level 2 (Red)    Row  Theraband;15 reps    Theraband Level (Shoulder Row)  Level 2 (Red)    Retraction  Theraband;15 reps    Theraband Level (Shoulder Retraction)  Level 2 (Red)      Shoulder Exercises: ROM/Strengthening   UBE (Upper Arm Bike)  3' forward and 3' reverse at 1.0    "W" Arms  10 times     X to V Arms  10 times    Proximal Shoulder Strengthening, Supine  10 times    Proximal Shoulder Strengthening, Seated  10 times       Manual Therapy   Manual Therapy  Myofascial release    Manual therapy comments  manual therapy interventions seperate from all other interventions    Myofascial Release  myofascial release and manual stretching to left upper arm, scapular, and shoulder region to decrease pain and improve mobility in left shoulder               OT Short Term Goals - 01/02/17 0933      OT SHORT TERM GOAL #1   Title  Patient will be educated on HEP for improved shoulder mobility.    Time  3    Period  Weeks    Status  On-going      OT SHORT TERM GOAL #2   Title  Patient will improve left shoulder P/ROM to WNL for improved ability to don and doff shirts.    Time  3    Period  Weeks    Status   Achieved      OT SHORT TERM GOAL #3   Title  Patient will improve left shoulder strength to 3+/5 for improved ability to lift car parts.    Time  3    Period  Weeks    Status  Achieved      OT SHORT TERM GOAL #4   Title  Patient will decrease pain to 3/10 or better in left shoulder during functional acitivities.     Time  3    Period  Weeks    Status  Achieved        OT Long Term Goals - 01/02/17 0935      OT LONG TERM GOAL #1   Title  Patient will return to prior level of independence with all B/IADLs and leisure activities using left arm.    Time  6    Period  Weeks    Status  On-going      OT LONG TERM GOAL #2   Title  Patient will improve left shoulder A/ROM to WNL for improved ability to reach overhead when working on cars.     Time  6    Period  Weeks    Status  On-going  OT LONG TERM GOAL #3   Title  Patient will improve left shoulder strength to 5/5 for improved ability to lift car parts and lawn equipment.     Time  6    Period  Weeks    Status  On-going      OT LONG TERM GOAL #4   Title  Patient will have 2/10 pain or less in left shoulder when completing functional activities.    Time  6    Period  Weeks    Status  On-going      OT LONG TERM GOAL #5   Title  Patient will have trace fascial restrictions in his left shoulder for greater mobility during functional activities.     Time  6    Period  Weeks    Status  On-going            Plan - 2017-01-25 1134    Clinical Impression Statement  A:  Patient has made signficant progress with P/ROM and A/ROM and strength in his left shoulder region.  He is experiencing less pain and fascial restrictions.  He is able to push himself up out of a chair or bed easier and is able to reach to shoulder height without difficulty.  He is continuing to have difficulty lifting heavy items or resting his arm on his elbow in a supinated position.      OT Frequency  2x / week    OT Duration  4 weeks    OT  Treatment/Interventions  Self-care/ADL training;Cryotherapy;Electrical Stimulation;Moist Heat;Neuromuscular education;Therapeutic exercise;Iontophoresis;Ultrasound;Energy conservation;Manual Therapy;Therapeutic activities;Therapeutic exercises;Passive range of motion;Scar mobilization;Patient/family education    Plan  P:  Continue skilled OT intervention to improve A/ROM to [facility] in order to reach overhead when working around the house and to improve strength to 5/5 in order to lift car parts.  Next session:  begin strengthening in supine.  UPdate HEP with A/ROM and scapular theraband.         Patient will benefit from skilled therapeutic intervention in order to improve the following deficits and impairments:  Decreased range of motion, Decreased scar mobility, Decreased strength, Increased fascial restricitons, Impaired UE functional use, Increased muscle spasms  Visit Diagnosis: Other symptoms and signs involving the musculoskeletal system  Acute pain of left shoulder  Stiffness of left shoulder, not elsewhere classified  G-Codes - 01/25/17 1136    Functional Assessment Tool Used (Outpatient only)  FOTO 62% independent, 38% limited     Functional Limitation  Carrying, moving and handling objects    Carrying, Moving and Handling Objects Current Status (K0254)  At least 20 percent but less than 40 percent impaired, limited or restricted    Carrying, Moving and Handling Objects Goal Status (Y7062)  At least 1 percent but less than 20 percent impaired, limited or restricted       Problem List Patient Active Problem List   Diagnosis Date Noted  . Cervical spinal stenosis 11/12/2015  . Crohn disease ([place]) 01/20/2015  . Rectal fistula 01/20/2015  . Noninfectious gastroenteritis, unspecified   . Rectal bleeding 10/21/2014  . Bowel habit changes 10/21/2014  . Obesity (BMI 30.0-34.9) 05/18/2014  . OSA (obstructive sleep apnea) 03/30/2013  . Chest pain 02/15/2013  . HTN (hypertension)  02/15/2013  . CAD (coronary artery disease) 02/15/2013  . Dyslipidemia 02/15/2013  . HAND, ARTHRITIS, DEGEN./OSTEO 12/01/2008  . TRIGGER FINGER 12/01/2008    Vangie Bicker, [place], OTR/L [phone]  01-25-17, 11:40 AM  [facility]  [address] [place], [name], [address] Phone: [phone]   Fax:  [phone]  Name: [name] MRN: [id number] Date of Birth: 17-Sep-1950

## 2017-01-03 ENCOUNTER — Encounter: Payer: Self-pay | Admitting: Cardiovascular Disease

## 2017-01-03 ENCOUNTER — Ambulatory Visit (INDEPENDENT_AMBULATORY_CARE_PROVIDER_SITE_OTHER): Payer: Medicare Other | Admitting: Cardiovascular Disease

## 2017-01-03 ENCOUNTER — Other Ambulatory Visit: Payer: Self-pay | Admitting: Cardiovascular Disease

## 2017-01-03 VITALS — BP 124/82 | HR 65 | Ht 69.0 in | Wt 221.6 lb

## 2017-01-03 DIAGNOSIS — I252 Old myocardial infarction: Secondary | ICD-10-CM

## 2017-01-03 DIAGNOSIS — I2510 Atherosclerotic heart disease of native coronary artery without angina pectoris: Secondary | ICD-10-CM | POA: Diagnosis not present

## 2017-01-03 DIAGNOSIS — E669 Obesity, unspecified: Secondary | ICD-10-CM

## 2017-01-03 DIAGNOSIS — E785 Hyperlipidemia, unspecified: Secondary | ICD-10-CM | POA: Diagnosis not present

## 2017-01-03 DIAGNOSIS — G4733 Obstructive sleep apnea (adult) (pediatric): Secondary | ICD-10-CM

## 2017-01-03 DIAGNOSIS — I10 Essential (primary) hypertension: Secondary | ICD-10-CM | POA: Diagnosis not present

## 2017-01-03 MED ORDER — ROSUVASTATIN CALCIUM 10 MG PO TABS: ORAL_TABLET | ORAL | 4 refills | Status: DC

## 2017-01-03 NOTE — Patient Instructions (Addendum)
Medication Instructions: START Rosuvastatin (Crestor). Please take half a tablet (5 mg) once a week for 3-4 weeks. Then take 5 mg twice a week (Wednesday and Sunday) for 4 weeks. After that please start taking 10 mg twice a week.   START CoQ10 over the counter medication  If you need a refill on your cardiac medications before your next appointment, please call your pharmacy.   Labwork: Your provider would like for you to return in 3 months for a CMET and Fasting Lipids. The [facility] office lab clinic is open Monday-Friday from 8 am to 4:30 pm. You do not need an appointment. It is closed from 1-2 pm for lunch.    Follow-Up: Your physician wants you to follow-up in: 4 months with Dr. Claiborne Billings. You will receive a reminder letter in the mail two months in advance. If you don't receive a letter, please call our office at [phone] to schedule this follow-up appointment.   Thank you for choosing [facility] at [facility]!!

## 2017-01-03 NOTE — Progress Notes (Signed)
Patient ID: JAMONTE CURFMAN, male   DOB: 27-Mar-1950, 66 y.o.   MRN: [id number]     HPI: KAIN MILOSEVIC is a 66 y.o. male who is a former patient of Dr. Rollene Fare who presents for a 6 month follow-up cardiology/sleep evaluation.  Mr. Pease has CAD and suffered an inferior wall MI in 1994.  Cardiac catheterization revealed RCA occlusion with left-to-right collaterals. He was treated initially with PTCA of his RCA. His last cardiac catheterization was in 2009 which showed an RCA occlusion with collaterals with no significant LAD disease, although there was mild 20% diagonal stenosis.  An echo Doppler study done in August 2014 by Dr. Rollene Fare showed mild LVH with mild hypokinesis of the inferolateral wall but with preserved global systolic function with an EF of 55-60%. He had normal diastolic parameters.  In January 2015 he was admitted by the hospitalist service with chest pain. A stress Myoview study did not demonstrate convincing inducible myocardial ischemia. There was limited evaluation of the inferior wall due to movement and overlap of the diaphragm and activity within the left lobe of the liver. Ejection fraction was 47%. He was seen by Dr. Rayann Heman for cardiology consultation who recommended no further cardiac workup.  Lipid evaluation reveals a total cholesterol 163 triglycerides 100 HDL 31 LDL 112 in the past, he had some difficulty taking statins.  Additional problems include hypertension, obesity, and arthritis. He has a remote history of right buttock abscess leading to hospitalization in December 2013.  When I initially saw him, I discontinued his gemfibrozil and recommended a trial of Livalo to take and hopefully tolerate  along with coenzyme Q10.  I titrated his amlodipine to 5 mg daily for more optimal blood pressure control.  I was also very concerned about obstructive sleep apnea and referred him for a sleep study.  Due to my concerns for obstructive sleep apnea, he underwent a split-night  protocol, on 05/10/2012.  This revealed severe sleep apnea with an AHI of 75.6 per hour.  During REM sleep this was 40 per hour.  He had significant oxygen desaturation to 78% with non-REM sleep and 76% with REM sleep.  He also had a significant positional component to his sleep apnea.  He was started on CPAP titration but due to high-pressure he was was switched to a BiPAP mode.  Ultimately, a BiPAP auto unit was recommended with an EPAP minimum of 10 and a IPAP maximum up to 25.  He also did have periodic lid movements with sleep with an index of 18.  He apparently initially used BiPAP for only 3 weeks but was having difficulty with his mass.  However, before  being seen for f/u he returned his BiPAP unit due to concerns that he was not compliant and would have to pay for this without insurance.  His sleep was very poor, he was unable to sleep on his back, and snored loudly.  I referred him for a follow-up sleep study which was done on 08/10/2015 which confirmed severe sleep apnea with an AHI of 95.7.  He ultimately underwent CPAP and then BiPAP titration and was titrated up to 17/13.  A BiPAP auto therapy was recommended with an EPAP minimum of 10 and IPAP maximum of 25.  He received his new BiPAP machine on 09/05/2015 and is compliant until the very beginning of October.  I reviewed his ResMed compliance data, which documents compliance, both for usage stays as well as usage greater than 4 hours.  However, on  10/05/2017bhe underwent cervical neck surgery by Dr. Rita Ohara and when he was wearing his cervical collar was not able to use his CPAP. His DME company is [employer].  He was diagnosed with Crohn's disease following a colonoscopy.  He has had issues with anal fissures and fistula.  Since I last saw him, he underwent surgery for an anal fistula repair in January 2018.  He tolerated that well from a cardiovascular standpoint.  He denies any episodes of chest pain or palpitations.  He continues to  use CPAP but has been having difficulty with his full face mask.  I obtained a download in the office from 06/06/2016 through 07/05/2016.  That time, he was only using treatment for 3 hours and 33 minutes per night. He has an ResMed AirCurve 10 Auto BiPAP  unit with maximum average IPAP pressure at 17 EPAP pressure of 13.  He had significant mask leak, I recommended a trial of a new dream where fullface mask.  When you receive this, he had an incredible night sleep.  The first night.  However, after the first night he had difficulty with mask adjustment to his nose region.  As result, his only been intermittently using therapy.  Review of records indicates that he has not had his lipid studies checked in a year.  He has been intolerant to Crestor, Lipitor, Zocor, Pravachol, and Livalo.  When checked by Dr. Gerarda Fraction in September 2017 LDL cholesterol was 161 with a total cholesterol 225.  He underwent left shoulder arthroscopy with subacromial decompression, rotator cuff repair and bicep tendon repair by Dr. Amada Jupiter on 11/30/2016.  On 12/31/2016 he presented to the emergency room with vertigo symptoms.  Apparently, a CT  was negative and there was some response to meclizine.  He was given information regarding Epley maneuvers to try at home.  He denies chest pain, PND, orthopnea, or palpitations.  He presents for evaluation.  Past Medical History:  Diagnosis Date  . Anxiety   . Articular cartilage disorder involving shoulder region, left 11/2016  . Bursitis of shoulder, left 11/2016  . Complication of anesthesia    hx. of heart rate dropping during 2 surgeries   (2004- back surgery and with Lithotrispy )  . Crohn's disease ([place])   . Depression   . Dyspnea    with exertion  . History of hepatitis    unknown type - 1970s  . History of kidney stones   . Hyperlipidemia    unable to tolerat statins  . Hypertension    states under control with meds., has been on med. since 1994  . Immature cataract of  both eyes   . Limited joint range of motion    cervical spine  . Myocardial infarct ([place]) 09/13/1992  . Nonobstructive atherosclerosis of coronary artery   . Numbness in both hands   . Obesity   . Osteoarthritis 11/2016   left shoulder  . Rotator cuff tear, left 11/2016  . Sleep apnea    uses BiPAP, but having issues with mask not sealing   . Thyroid nodule     Past Surgical History:  Procedure Laterality Date  . ANAL FISTULOTOMY N/A 06/28/2013   Procedure: ANAL FISTULOTOMY;  Surgeon: [name], MD;  Location: [facility];  Service: General;  Laterality: N/A;  . ANAL FISTULOTOMY  2017  . ANTERIOR CERVICAL DECOMP/DISCECTOMY FUSION N/A 11/12/2015   Procedure: ANTERIOR CERVICAL DECOMPRESSION/DISCECTOMY FUSION CERVICAL THREE- CERVICAL FOUR, CERVICAL FOUR- CERVICAL FIVE;  Surgeon: Jovita Gamma,  MD;  Location: [facility];  Service: Neurosurgery;  Laterality: N/A;  ANTERIOR CERVICAL DECOMPRESSION/DISCECTOMY FUSION C3-C4, C4-C5  . BIOPSY THYROID    . CARDIAC CATHETERIZATION  1994, 12/25/2007   a. PTCA alone in 1994 b. RCA CTO w/ L-R collaterals, 40-50% prox RCA, 20% prox LAD; EF 50-55%, inferoapical HK  . CARDIAC CATHETERIZATION  04/02/2001  . CARDIAC CATHETERIZATION  11/07/2000  . CARDIAC CATHETERIZATION  09/24/1996  . CARPAL TUNNEL RELEASE Left 10/09/2014   Procedure: LEFT CARPAL TUNNEL RELEASE;  Surgeon: [name], MD;  Location: [facility];  Service: Orthopedics;  Laterality: Left;  . CARPAL TUNNEL RELEASE Right 08/28/2003  . CAUTERY OF TURBINATES  11/05/2003  . COLONOSCOPY N/A 11/06/2014   RMR: Skipped Colonic ulcerations with significant ileocecal valve involvement and rectal sparing most consisitant with Crohns disease. Minimal non-steroidal drug use. Probable colonic lipoma  . CYSTECTOMY    . INCISION AND DRAINAGE ABSCESS Right 05/23/2012   Procedure: INCISION AND DRAINAGE ABSCESS;  Surgeon: [name], MD;  Location: [facility];  Service: General;  Laterality: Right;  .  KNEE ARTHROSCOPY    . LITHOTRIPSY    . LUMBAR FUSION  02/18/2002  . LUMBAR LAMINECTOMY  02/18/2002   L4-5  . NASAL SEPTUM SURGERY  11/05/2003  . PARTIAL NEPHRECTOMY Left    age 21  . SHOULDER ARTHROSCOPY WITH SUBACROMIAL DECOMPRESSION, ROTATOR CUFF REPAIR AND BICEP TENDON REPAIR Left 11/30/2016   Procedure: LEFT SHOULDER ARTHROSCOPY, DEBRIDEMENT, ACROMIOPLASTY WITH DISTAL CLAVICAL EXCISION, POSSIBLE ROTATOR CUFF REPAIR AND BICEP TENODESIS;  Surgeon: Ninetta Lights, MD;  Location: [facility];  Service: Orthopedics;  Laterality: Left;  . SPLENECTOMY     age 28  . TRANSTHORACIC ECHOCARDIOGRAM  03/05/2009   EF >55%, normal LV wall thickness.    Allergies  Allergen Reactions  . Niacin And Related Other (See Comments)    FLUSHING  . Penicillins Hives and Itching    Has patient had a PCN reaction causing immediate rash, facial/tongue/throat swelling, SOB or lightheadedness with hypotension: Unknown Has patient had a PCN reaction causing severe rash involving mucus membranes or skin necrosis: Unknown Has patient had a PCN reaction that required hospitalization: Unknown Has patient had a PCN reaction occurring within the last 10 years: No If all of the above answers are "NO", then may proceed with Cephalosporin use.   . Statins Other (See Comments)    MYALGIAS    Current Outpatient Medications  Medication Sig Dispense Refill  . ALPRAZolam (XANAX) 0.5 MG tablet Take 1 tablet (0.5 mg total) by mouth 3 (three) times daily as needed (dizziness). 21 tablet 0  . amLODipine (NORVASC) 5 MG tablet Take 1 tablet (5 mg total) by mouth daily. 180 tablet 3  . carbamide peroxide (DEBROX) 6.5 % OTIC solution Place 5 drops into both ears 2 (two) times daily as needed (FOR EAR WAX REMOVAL).    Marland Kitchen meclizine (ANTIVERT) 12.5 MG tablet Take 2 tablets (25 mg total) by mouth 3 (three) times daily as needed for dizziness. 21 tablet 0  . metoprolol succinate (TOPROL-XL) 50 MG 24 hr tablet Take 50 mg by mouth daily.  Take with or immediately following a meal.    . nitroGLYCERIN (NITROSTAT) 0.4 MG SL tablet Place 0.4 mg under the tongue every 5 (five) minutes as needed for chest pain. Reported on 07/16/2015    . ondansetron (ZOFRAN) 4 MG tablet Take 1 tablet (4 mg total) by mouth every 8 (eight) hours as needed for nausea or vomiting. (Patient not  taking: Reported on 12/12/2016) 40 tablet 0  . oxyCODONE-acetaminophen (ROXICET) 5-325 MG tablet Take 1-2 tablets by mouth every 4 (four) hours as needed. (Patient not taking: Reported on 12/12/2016) 60 tablet 0  . Probiotic Product (PROBIOTIC DAILY PO) Take 1 capsule by mouth daily. [place]    . Psyllium Husk POWD Take 12 g by mouth daily. INTO 12 OUNCES OF WATER (1 TABLESPOON)    . ramipril (ALTACE) 10 MG capsule Take 10 mg by mouth daily.     No current facility-administered medications for this visit.     Social History   Socioeconomic History  . Marital status: Married    Spouse name: Not on file  . Number of children: Not on file  . Years of education: Not on file  . Highest education level: Not on file  Social Needs  . Financial resource strain: Not on file  . Food insecurity - worry: Not on file  . Food insecurity - inability: Not on file  . Transportation needs - medical: Not on file  . Transportation needs - non-medical: Not on file  Occupational History  . Not on file  Tobacco Use  . Smoking status: Former Smoker    Packs/day: 0.00    Years: 18.00    Pack years: 0.00    Last attempt to quit: 02/06/1993    Years since quitting: 23.9  . Smokeless tobacco: Former Network engineer and Sexual Activity  . Alcohol use: No    Alcohol/week: 0.0 oz  . Drug use: No  . Sexual activity: Yes  Other Topics Concern  . Not on file  Social History Narrative   Lives in [place], [name]. Married, 2 children.     Family History  Problem Relation Age of Onset  . CAD Father        CABG   . CAD Brother        CABG in 69s  . CAD Brother   .  Arthritis/Rheumatoid Mother   . Hyperlipidemia Mother   . Thrombocytopenia Mother   . COPD Son        smoker  . Alzheimer's disease Maternal Grandmother   . Lung disease Paternal Grandfather    ROS General: Negative; No fevers, chills, or night sweats HEENT: Negative; No changes in vision or hearing, sinus congestion, difficulty swallowing Pulmonary: Negative; No cough, wheezing, shortness of breath, hemoptysis Cardiovascular: Negative; No chest pain, presyncope, syncope, palpatations GI: Negative; No nausea, vomiting, diarrhea, or abdominal pain GU: Negative; No dysuria, hematuria, or difficulty voiding Musculoskeletal: Positive for cervical disc disease. Hematologic: Negative; no easy bruising, bleeding Endocrine: Negative; no heat/cold intolerance Neuro: Negative; no changes in balance, headaches Skin: Negative; No rashes or skin lesions Psychiatric: Negative; No behavioral problems, depression Sleep: positive severe sleep apnea, Now having received another BiPAP unit  daytime sleepiness, hypersomnolence, and snoring, no bruxism, he admits to limb movement, but denies painful restless legs, no hypnogognic hallucinations, no cataplexy   PE BP 124/82   Pulse 65   Ht '5\' 9"'  (1.753 m)   Wt 221 lb 9.6 oz (100.5 kg)   BMI 32.72 kg/m   Repeat blood pressure by me was 122/78 supine and 130/78 standing  Wt Readings from Last 3 Encounters:  12/31/16 230 lb (104.3 kg)  11/30/16 232 lb (105.2 kg)  09/13/16 220 lb 12.8 oz (100.2 kg)   General: Alert, oriented, no distress.  Skin: normal turgor, no rashes, warm and dry HEENT: Normocephalic, atraumatic. Pupils equal round and reactive to  light; sclera anicteric; extraocular muscles intact; no nystagmus Nose without nasal septal hypertrophy Mouth/Parynx benign; Mallinpatti scale 3 Neck: No JVD, no carotid bruits; normal carotid upstroke Lungs: clear to ausculatation and percussion; no wheezing or rales Chest wall: without tenderness  to palpitation Heart: PMI not displaced, RRR, s1 s2 normal, 1/6 systolic murmur, no diastolic murmur, no rubs, gallops, thrills, or heaves Abdomen: soft, nontender; no hepatosplenomehaly, BS+; abdominal aorta nontender and not dilated by palpation. Back: no CVA tenderness Pulses 2+ Musculoskeletal: full range of motion, normal strength, no joint deformities Extremities: no clubbing cyanosis or edema, Homan's sign negative  Neurologic: grossly nonfocal; Cranial nerves grossly wnl Psychologic: Normal mood and affect   ECG (independently read by me): Normal sinus rhythm at 65 bpm.  First-degree AV block with a PR interval of 212 ms.  No syncope.  No ST segment changes.  May 2018 ECG (independently read by me): Normal sinus rhythm at 65 bpm.  Normal intervals.  No ectopy.  Small nondiagnostic inferior Q waves   October 2017 ECG (independently read by me): Sinus rhythm with first-degree AV block.  Will intervals.  May 2017 ECG (independently read by me): Normal sinus rhythm at 73 bpm.  Small nondiagnostic inferior Q waves.  No ST segment changes.  April 2016 ECG (independently read by me): Normal sinus rhythm at 68 bpm.  Nonspecific T changes.  Normal intervals.  April 2015 ECG (independently read by me): Normal sinus rhythm at 61 beats per minute.  Normal intervals  Prior 03/28/2013 ECG (independently read by me): Sinus rhythm at 66 beats per minute. No ectopy. Q-wave in lead 3   LABS:  BMP Latest Ref Rng & Units 12/31/2016 12/31/2016 11/30/2016  Glucose 65 - 99 mg/dL 104(H) 102(H) 93  BUN 6 - 20 mg/dL '9 10 11  ' Creatinine 0.61 - 1.24 mg/dL 0.70 0.68 0.86  Sodium 135 - 145 mmol/L 143 139 138  Potassium 3.5 - 5.1 mmol/L 3.7 3.7 4.0  Chloride 101 - 111 mmol/L 103 104 103  CO2 22 - 32 mmol/L - 26 22  Calcium 8.9 - 10.3 mg/dL - 9.0 8.9    Hepatic Function Latest Ref Rng & Units 12/31/2016 12/18/2015 01/20/2015  Total Protein 6.5 - 8.1 g/dL 7.8 7.2 6.9  Albumin 3.5 - 5.0 g/dL 4.1 3.9 3.8   AST 15 - 41 U/L '23 15 18  ' ALT 17 - 63 U/L '23 15 14  ' Alk Phosphatase 38 - 126 U/L 106 103 104  Total Bilirubin 0.3 - 1.2 mg/dL 0.6 0.5 0.4    CBC Latest Ref Rng & Units 12/31/2016 12/31/2016 11/30/2016  WBC 4.0 - 10.5 K/uL - 6.3 6.7  Hemoglobin 13.0 - 17.0 g/dL 16.3 16.0 15.5  Hematocrit 39.0 - 52.0 % 48.0 48.8 45.0  Platelets 150 - 400 K/uL - 382 367    Lab Results  Component Value Date   MCV 91.7 12/31/2016   MCV 89.6 11/30/2016   MCV 87.8 12/18/2015    Lab Results  Component Value Date   TSH 1.315 06/10/2014   No results found for: HGBA1C   Lipid Panel     Component Value Date/Time   CHOL 192 06/10/2014 0811   TRIG 170 (H) 06/10/2014 0811   HDL 32 (L) 06/10/2014 0811   CHOLHDL 6.0 06/10/2014 0811   VLDL 34 06/10/2014 0811   LDLCALC 126 (H) 06/10/2014 0811   BNP No results found for: PROBNP   RADIOLOGY: No results found.  IMPRESSION:  No diagnosis found.  ASSESSMENT AND  PLAN: Mr. Tarig Zimmers is a 66 year old gentleman who suffered an inferior wall myocardial infarction in 1994 due to total RCA occlusion with good left to right collaterals without significant contralateral disease. His last nuclear perfusion study did not demonstrate significant ischemia. However, ejection fraction was 47%. His echo Doppler study did show inferolateral wall motion abnormality.  He has been fairly stable without recurrent anginal symptoms.  He is on amlodipine 5 mg, Toprol-XL 50 mg, ramipril 10 mg daily, both for blood pressure and CAD.  He has not required any nitrates.   He developed significant myalgias to statins and is not on therapy.  Marland Kitchen  His last lipid studies were done in September 2017 by his primary physician.  Total cholesterol was 225 with an LDL of 161.  I have recommended a slight rechallenge of Crestor.  He will start 5 mg and take 5 mg one day a week for 3-4 weeks and if he tolerates this, he will then try to increase this to 2 days per week on Wednesdays and Sundays.   He has been intolerant to other statins.  If he is unable to tolerate this.  Now that angina has significantly reduced the list Price for Repatha down to 5850 from 14,000 per year Medicare co-pay may be significantly more affordable.  He is 24 years status post his MI and has documented RCA occlusion with collaterals.  At his present LDL elevation.  He is at high risk for progressive CAD and recurrent MI risk.  I reviewed the Repatha [place] trial clinical data.  His blood pressure today remains stable on amlodipine 5 mg, Toprol-XL 50 mg, and ramipril 10 mg.  I reviewed the importance of attempting to use BiPAP on a daily basis.  He will contact his DME company concerning his mask and perhaps the new ResMed drain were-like mask may be worth trying since he tolerated initially the Respironics deam wear FFM.  He has mild obesity.  Weight loss and increased exercise was recommended. .  I will recheck laboratory in 2-3 months and see him in the office in 4 months for follow-up evaluation.  Time spent: 6mnutes  TTroy Sine MD, [facility] 01/03/2017 1:29 PM

## 2017-01-04 ENCOUNTER — Ambulatory Visit (HOSPITAL_COMMUNITY): Payer: Medicare Other

## 2017-01-04 DIAGNOSIS — R29898 Other symptoms and signs involving the musculoskeletal system: Secondary | ICD-10-CM

## 2017-01-04 DIAGNOSIS — M25512 Pain in left shoulder: Secondary | ICD-10-CM

## 2017-01-04 DIAGNOSIS — M25612 Stiffness of left shoulder, not elsewhere classified: Secondary | ICD-10-CM

## 2017-01-04 NOTE — Therapy (Signed)
[place], [name], [address] Phone: [phone]   Fax:  [phone]  Occupational Therapy Treatment  Patient Details  Name: [name] MRN: [id number] Date of Birth: October 01, 1950 Referring Provider: Dr. Ninetta Lights   Encounter Date: 01/04/2017  OT End of Session - 01/04/17 0929    Visit Number  8    Number of Visits  12    Date for OT Re-Evaluation  01/23/17    Authorization Type  Medicare part b    Authorization Time Period  before 17th visit    Authorization - Visit Number  8    Authorization - Number of Visits  17    OT Start Time  0820    OT Stop Time  0902    OT Time Calculation (min)  42 min    Activity Tolerance  Patient tolerated treatment well    Behavior During Therapy  [facility] for tasks assessed/performed       Past Medical History:  Diagnosis Date  . Anxiety   . Articular cartilage disorder involving shoulder region, left 11/2016  . Bursitis of shoulder, left 11/2016  . Complication of anesthesia    hx. of heart rate dropping during 2 surgeries   (2004- back surgery and with Lithotrispy )  . Crohn's disease ([place])   . Depression   . Dyspnea    with exertion  . History of hepatitis    unknown type - 1970s  . History of kidney stones   . Hyperlipidemia    unable to tolerat statins  . Hypertension    states under control with meds., has been on med. since 1994  . Immature cataract of both eyes   . Limited joint range of motion    cervical spine  . Myocardial infarct ([place]) 09/13/1992  . Nonobstructive atherosclerosis of coronary artery   . Numbness in both hands   . Obesity   . Osteoarthritis 11/2016   left shoulder  . Rotator cuff tear, left 11/2016  . Sleep apnea    uses BiPAP, but having issues with mask not sealing   . Thyroid nodule     Past Surgical History:  Procedure Laterality Date  . ANAL FISTULOTOMY N/A 06/28/2013   Procedure: ANAL FISTULOTOMY;  Surgeon: [name], MD;  Location: [facility];  Service: General;  Laterality: N/A;  . ANAL FISTULOTOMY  2017  . ANTERIOR CERVICAL DECOMP/DISCECTOMY FUSION N/A 11/12/2015   Procedure: ANTERIOR CERVICAL DECOMPRESSION/DISCECTOMY FUSION CERVICAL THREE- CERVICAL FOUR, CERVICAL FOUR- CERVICAL FIVE;  Surgeon: Jovita Gamma, MD;  Location: [place];  Service: Neurosurgery;  Laterality: N/A;  ANTERIOR CERVICAL DECOMPRESSION/DISCECTOMY FUSION C3-C4, C4-C5  . BIOPSY THYROID    . CARDIAC CATHETERIZATION  1994, 12/25/2007   a. PTCA alone in 1994 b. RCA CTO w/ L-R collaterals, 40-50% prox RCA, 20% prox LAD; EF 50-55%, inferoapical HK  . CARDIAC CATHETERIZATION  04/02/2001  . CARDIAC CATHETERIZATION  11/07/2000  . CARDIAC CATHETERIZATION  09/24/1996  . CARPAL TUNNEL RELEASE Left 10/09/2014   Procedure: LEFT CARPAL TUNNEL RELEASE;  Surgeon: [name], MD;  Location: [place];  Service: Orthopedics;  Laterality: Left;  . CARPAL TUNNEL RELEASE Right 08/28/2003  . CAUTERY OF TURBINATES  11/05/2003  . COLONOSCOPY N/A 11/06/2014   RMR: Skipped Colonic ulcerations with significant ileocecal valve involvement and rectal sparing most consisitant with Crohns disease. Minimal non-steroidal drug use. Probable colonic lipoma  . CYSTECTOMY    . INCISION AND DRAINAGE  ABSCESS Right 05/23/2012   Procedure: INCISION AND DRAINAGE ABSCESS;  Surgeon: [name], MD;  Location: [facility];  Service: General;  Laterality: Right;  . KNEE ARTHROSCOPY    . LITHOTRIPSY    . LUMBAR FUSION  02/18/2002  . LUMBAR LAMINECTOMY  02/18/2002   L4-5  . NASAL SEPTUM SURGERY  11/05/2003  . PARTIAL NEPHRECTOMY Left    age 89  . SHOULDER ARTHROSCOPY WITH SUBACROMIAL DECOMPRESSION, ROTATOR CUFF REPAIR AND BICEP TENDON REPAIR Left 11/30/2016   Procedure: LEFT SHOULDER ARTHROSCOPY, DEBRIDEMENT, ACROMIOPLASTY WITH DISTAL CLAVICAL EXCISION, POSSIBLE ROTATOR CUFF REPAIR AND BICEP TENODESIS;  Surgeon: Ninetta Lights, MD;  Location: [place];  Service:  Orthopedics;  Laterality: Left;  . SPLENECTOMY     age 71  . TRANSTHORACIC ECHOCARDIOGRAM  03/05/2009   EF >55%, normal LV wall thickness.    There were no vitals filed for this visit.  Subjective Assessment - 01/04/17 0928    Subjective   S: I need to finish that swing set.     Currently in Pain?  Yes    Pain Score  5     Pain Location  Shoulder    Pain Orientation  Left    Pain Descriptors / Indicators  Aching    Pain Type  Acute pain                   OT Treatments/Exercises (OP) - 01/04/17 0848      Exercises   Exercises  Shoulder             OT Education - 01/04/17 0928    Education provided  Yes    Education Details  A/ROM and scapular theraband strengthening with red band.    Person(s) Educated  Patient    Methods  Explanation;Demonstration;Verbal cues;Handout    Comprehension  Returned demonstration;Verbalized understanding       OT Short Term Goals - 01/04/17 0938      OT SHORT TERM GOAL #1   Title  Patient will be educated on HEP for improved shoulder mobility.    Time  3    Period  Weeks    Status  On-going      OT SHORT TERM GOAL #2   Title  Patient will improve left shoulder P/ROM to WNL for improved ability to don and doff shirts.    Time  3    Period  Weeks      OT SHORT TERM GOAL #3   Title  Patient will improve left shoulder strength to 3+/5 for improved ability to lift car parts.    Time  3    Period  Weeks      OT SHORT TERM GOAL #4   Title  Patient will decrease pain to 3/10 or better in left shoulder during functional acitivities.     Time  3    Period  Weeks        OT Long Term Goals - 01/02/17 0935      OT LONG TERM GOAL #1   Title  Patient will return to prior level of independence with all B/IADLs and leisure activities using left arm.    Time  6    Period  Weeks    Status  On-going      OT LONG TERM GOAL #2   Title  Patient will improve left shoulder A/ROM to WNL for improved ability to reach overhead  when working on cars.     Time  6  Period  Weeks    Status  On-going      OT LONG TERM GOAL #3   Title  Patient will improve left shoulder strength to 5/5 for improved ability to lift car parts and lawn equipment.     Time  6    Period  Weeks    Status  On-going      OT LONG TERM GOAL #4   Title  Patient will have 2/10 pain or less in left shoulder when completing functional activities.    Time  6    Period  Weeks    Status  On-going      OT LONG TERM GOAL #5   Title  Patient will have trace fascial restrictions in his left shoulder for greater mobility during functional activities.     Time  6    Period  Weeks    Status  On-going            Plan - 01/04/17 1660    Clinical Impression Statement  A: Increased to 1# supine for strengthening. patient reports that the weight wasn't the issue but the movement was difficult. HEP was updated with A/ROM and scapular strengthening. VC for form and technique.    Plan  P: Follow up on HEP. Progress to using a 1# weight standing when able to tolerate.     Consulted and Agree with Plan of Care  Patient       Patient will benefit from skilled therapeutic intervention in order to improve the following deficits and impairments:  Decreased range of motion, Decreased scar mobility, Decreased strength, Increased fascial restricitons, Impaired UE functional use, Increased muscle spasms  Visit Diagnosis: Other symptoms and signs involving the musculoskeletal system  Acute pain of left shoulder  Stiffness of left shoulder, not elsewhere classified    Problem List Patient Active Problem List   Diagnosis Date Noted  . Cervical spinal stenosis 11/12/2015  . Crohn disease ([place]) 01/20/2015  . Rectal fistula 01/20/2015  . Noninfectious gastroenteritis, unspecified   . Rectal bleeding 10/21/2014  . Bowel habit changes 10/21/2014  . Obesity (BMI 30.0-34.9) 05/18/2014  . OSA (obstructive sleep apnea) 03/30/2013  . Chest pain 02/15/2013   . HTN (hypertension) 02/15/2013  . CAD (coronary artery disease) 02/15/2013  . Dyslipidemia 02/15/2013  . HAND, ARTHRITIS, DEGEN./OSTEO 12/01/2008  . TRIGGER FINGER 12/01/2008   Ailene Ravel, OTR/L,CBIS  [phone]  01/04/2017, 9:40 AM  [place] [address] [place], [name], [address] Phone: [phone]   Fax:  [phone]  Name: [name] MRN: [id number] Date of Birth: 1950/12/18

## 2017-01-04 NOTE — Patient Instructions (Addendum)
Repeat all exercises 10-15 times, 1-2 times per day.  1) Shoulder Protraction - stand up    Begin with elbows by your side, slowly "punch" straight out in front of you.      2) Shoulder Flexion  Standing:         Begin with arms at your side with thumbs pointed up, slowly raise both arms up and forward towards overhead.    3) Horizontal abduction/adduction   Standing:           Begin with arms straight out in front of you, bring out to the side in at "T" shape. Keep arms straight entire time.         4) Internal & External Rotation    *No band* -Stand with elbows at the side and elbows bent 90 degrees. Move your forearms away from your body, then bring back inward toward the body.     5) Shoulder Abduction  Standing:       Begin with your arms next to your side. Slowly move your arms out to the side so that they go overhead, in a jumping jack or snow angel movement.        (Home) Extension: Isometric / Bilateral Arm Retraction - Sitting   Facing anchor, hold hands and elbow at shoulder height, with elbow bent.  Pull arms back to squeeze shoulder blades together. Repeat 10-15 times. 1-3 times/day.   Copyright  VHI. All rights reserved.     (Clinic) Extension / Flexion (Assist)   Face anchor, pull arms back, keeping elbow straight, and squeze shoulder blades together. Repeat 10-15 times. 1-3 times/day.   Copyright  VHI. All rights reserved.   (Home) Retraction: Row - Bilateral (Anchor)   Facing anchor, arms reaching forward, pull hands toward stomach, keeping elbows bent and at your sides and pinching shoulder blades together. Repeat 10-15 times. 1-3 times/day.   Copyright  VHI. All rights reserved.

## 2017-01-09 ENCOUNTER — Ambulatory Visit (HOSPITAL_COMMUNITY): Payer: Medicare Other | Attending: Orthopedic Surgery | Admitting: Specialist

## 2017-01-09 ENCOUNTER — Encounter (HOSPITAL_COMMUNITY): Payer: Self-pay | Admitting: Specialist

## 2017-01-09 DIAGNOSIS — M25612 Stiffness of left shoulder, not elsewhere classified: Secondary | ICD-10-CM | POA: Diagnosis not present

## 2017-01-09 DIAGNOSIS — M25512 Pain in left shoulder: Secondary | ICD-10-CM | POA: Insufficient documentation

## 2017-01-09 DIAGNOSIS — R29898 Other symptoms and signs involving the musculoskeletal system: Secondary | ICD-10-CM | POA: Diagnosis not present

## 2017-01-09 NOTE — Therapy (Signed)
[place], [name], [address] Phone: [phone]   Fax:  [phone]  Occupational Therapy Treatment  Patient Details  Name: [name] MRN: [id number] Date of Birth: 1950/05/03 Referring Provider: Dr. Ninetta Lights   Encounter Date: 01/09/2017  OT End of Session - 01/09/17 0944    Visit Number  9    Number of Visits  12    Date for OT Re-Evaluation  01/23/17    Authorization Type  Medicare part b    Authorization Time Period  before 17th visit    Authorization - Visit Number  9    Authorization - Number of Visits  17    OT Start Time  [phone]    OT Stop Time  0948    OT Time Calculation (min)  43 min    Activity Tolerance  Patient tolerated treatment well    Behavior During Therapy  [facility] for tasks assessed/performed       Past Medical History:  Diagnosis Date  . Anxiety   . Articular cartilage disorder involving shoulder region, left 11/2016  . Bursitis of shoulder, left 11/2016  . Complication of anesthesia    hx. of heart rate dropping during 2 surgeries   (2004- back surgery and with Lithotrispy )  . Crohn's disease ([place])   . Depression   . Dyspnea    with exertion  . History of hepatitis    unknown type - 1970s  . History of kidney stones   . Hyperlipidemia    unable to tolerat statins  . Hypertension    states under control with meds., has been on med. since 1994  . Immature cataract of both eyes   . Limited joint range of motion    cervical spine  . Myocardial infarct ([place]) 09/13/1992  . Nonobstructive atherosclerosis of coronary artery   . Numbness in both hands   . Obesity   . Osteoarthritis 11/2016   left shoulder  . Rotator cuff tear, left 11/2016  . Sleep apnea    uses BiPAP, but having issues with mask not sealing   . Thyroid nodule     Past Surgical History:  Procedure Laterality Date  . ANAL FISTULOTOMY N/A 06/28/2013   Procedure: ANAL FISTULOTOMY;  Surgeon: [name], MD;  [facility];  Service: General;  Laterality: N/A;  . ANAL FISTULOTOMY  2017  . ANTERIOR CERVICAL DECOMP/DISCECTOMY FUSION N/A 11/12/2015   Procedure: ANTERIOR CERVICAL DECOMPRESSION/DISCECTOMY FUSION CERVICAL THREE- CERVICAL FOUR, CERVICAL FOUR- CERVICAL FIVE;  Surgeon: Jovita Gamma, MD;  Location: [place];  Service: Neurosurgery;  Laterality: N/A;  ANTERIOR CERVICAL DECOMPRESSION/DISCECTOMY FUSION C3-C4, C4-C5  . BIOPSY THYROID    . CARDIAC CATHETERIZATION  1994, 12/25/2007   a. PTCA alone in 1994 b. RCA CTO w/ L-R collaterals, 40-50% prox RCA, 20% prox LAD; EF 50-55%, inferoapical HK  . CARDIAC CATHETERIZATION  04/02/2001  . CARDIAC CATHETERIZATION  11/07/2000  . CARDIAC CATHETERIZATION  09/24/1996  . CARPAL TUNNEL RELEASE Left 10/09/2014   Procedure: LEFT CARPAL TUNNEL RELEASE;  Surgeon: [name], MD;  Location: [place];  Service: Orthopedics;  Laterality: Left;  . CARPAL TUNNEL RELEASE Right 08/28/2003  . CAUTERY OF TURBINATES  11/05/2003  . COLONOSCOPY N/A 11/06/2014   RMR: Skipped Colonic ulcerations with significant ileocecal valve involvement and rectal sparing most consisitant with Crohns disease. Minimal non-steroidal drug use. Probable colonic lipoma  . CYSTECTOMY    . INCISION AND DRAINAGE  ABSCESS Right 05/23/2012   Procedure: INCISION AND DRAINAGE ABSCESS;  Surgeon: [name], MD;  [facility];  Service: General;  Laterality: Right;  . KNEE ARTHROSCOPY    . LITHOTRIPSY    . LUMBAR FUSION  02/18/2002  . LUMBAR LAMINECTOMY  02/18/2002   L4-5  . NASAL SEPTUM SURGERY  11/05/2003  . PARTIAL NEPHRECTOMY Left    age 39  . SHOULDER ARTHROSCOPY WITH SUBACROMIAL DECOMPRESSION, ROTATOR CUFF REPAIR AND BICEP TENDON REPAIR Left 11/30/2016   Procedure: LEFT SHOULDER ARTHROSCOPY, DEBRIDEMENT, ACROMIOPLASTY WITH DISTAL CLAVICAL EXCISION, POSSIBLE ROTATOR CUFF REPAIR AND BICEP TENODESIS;  Surgeon: Ninetta Lights, MD;  Location: [place];  Service:  Orthopedics;  Laterality: Left;  . SPLENECTOMY     age 53  . TRANSTHORACIC ECHOCARDIOGRAM  03/05/2009   EF >55%, normal LV wall thickness.    There were no vitals filed for this visit.  Subjective Assessment - 01/09/17 0907    Subjective   S:  They put me back on crestor, and that makes my joints ache.      Currently in Pain?  No/denies         [facility] OT Assessment - 01/09/17 0001      Assessment   Diagnosis  Left Shoulder Partial Rotator Cuff Repair, SAD/DCE Biceps tendon Repair      Precautions   Precautions  Shoulder    Type of Shoulder Precautions  progress with AA/ROM and A/ROM as tolerated               OT Treatments/Exercises (OP) - 01/09/17 0001      Exercises   Exercises  Shoulder      Shoulder Exercises: Supine   Protraction  PROM;5 reps;Strengthening;12 reps    Protraction Weight (lbs)  1    Horizontal ABduction  PROM;5 reps;Strengthening;12 reps    Horizontal ABduction Weight (lbs)  1    External Rotation  PROM;5 reps;Strengthening;12 reps    External Rotation Weight (lbs)  1    Internal Rotation  PROM;5 reps;Strengthening;12 reps    Internal Rotation Weight (lbs)  1    Flexion  PROM;5 reps;Strengthening;12 reps    Shoulder Flexion Weight (lbs)  1    ABduction  PROM;5 reps;Strengthening;12 reps    Shoulder ABduction Weight (lbs)  1      Shoulder Exercises: Standing   Protraction  Strengthening;12 reps    Protraction Weight (lbs)  1    Horizontal ABduction  Strengthening;12 reps    Horizontal ABduction Weight (lbs)  1    External Rotation  Strengthening;12 reps    External Rotation Weight (lbs)  1    Internal Rotation  Strengthening;12 reps    Internal Rotation Weight (lbs)  1    Flexion  Strengthening;12 reps    Shoulder Flexion Weight (lbs)  1    ABduction  Strengthening;12 reps    Shoulder ABduction Weight (lbs)  1      Shoulder Exercises: ROM/Strengthening   UBE (Upper Arm Bike)  3' forward and 3' reverse at 1.0    Cybex Press  1  plate;15 reps    Cybex Row  1 plate;15 reps    "W" Arms  10 times with 1#    X to V Arms  10 times with 1#    Proximal Shoulder Strengthening, Supine  10 time with 1 #    Proximal Shoulder Strengthening, Seated  10 times with 1#    Ball on Wall  1 min with arm flexed to 90  and 1 min wth arm abducted to 90       Manual Therapy   Manual Therapy  Myofascial release    Manual therapy comments  manual therapy interventions seperate from all other interventions    Myofascial Release  myofascial release and manual stretching to left upper arm, scapular, and shoulder region to decrease pain and improve mobility in left shoulder               OT Short Term Goals - 01/04/17 1914      OT SHORT TERM GOAL #1   Title  Patient will be educated on HEP for improved shoulder mobility.    Time  3    Period  Weeks    Status  On-going      OT SHORT TERM GOAL #2   Title  Patient will improve left shoulder P/ROM to WNL for improved ability to don and doff shirts.    Time  3    Period  Weeks      OT SHORT TERM GOAL #3   Title  Patient will improve left shoulder strength to 3+/5 for improved ability to lift car parts.    Time  3    Period  Weeks      OT SHORT TERM GOAL #4   Title  Patient will decrease pain to 3/10 or better in left shoulder during functional acitivities.     Time  3    Period  Weeks        OT Long Term Goals - 01/02/17 0935      OT LONG TERM GOAL #1   Title  Patient will return to prior level of independence with all B/IADLs and leisure activities using left arm.    Time  6    Period  Weeks    Status  On-going      OT LONG TERM GOAL #2   Title  Patient will improve left shoulder A/ROM to WNL for improved ability to reach overhead when working on cars.     Time  6    Period  Weeks    Status  On-going      OT LONG TERM GOAL #3   Title  Patient will improve left shoulder strength to 5/5 for improved ability to lift car parts and lawn equipment.     Time  6     Period  Weeks    Status  On-going      OT LONG TERM GOAL #4   Title  Patient will have 2/10 pain or less in left shoulder when completing functional activities.    Time  6    Period  Weeks    Status  On-going      OT LONG TERM GOAL #5   Title  Patient will have trace fascial restrictions in his left shoulder for greater mobility during functional activities.     Time  6    Period  Weeks    Status  On-going            Plan - 01/09/17 0944    Clinical Impression Statement  A:  Added 1 pound to standing exercises and proximal shoulder strengthening and stability exercises.  Patient reports exercises difficult but not painful.     Plan  P:  follow up on HEP, increase repetitions with all strengthening exercises.         Patient will benefit from skilled therapeutic intervention in order to improve the following deficits and  impairments:  Decreased range of motion, Decreased strength, Increased fascial restrictions, Increased muscle spasms, Pain  Visit Diagnosis: Other symptoms and signs involving the musculoskeletal system  Acute pain of left shoulder  Stiffness of left shoulder, not elsewhere classified    Problem List Patient Active Problem List   Diagnosis Date Noted  . Cervical spinal stenosis 11/12/2015  . Crohn disease ([place]) 01/20/2015  . Rectal fistula 01/20/2015  . Noninfectious gastroenteritis, unspecified   . Rectal bleeding 10/21/2014  . Bowel habit changes 10/21/2014  . Obesity (BMI 30.0-34.9) 05/18/2014  . OSA (obstructive sleep apnea) 03/30/2013  . Chest pain 02/15/2013  . HTN (hypertension) 02/15/2013  . CAD (coronary artery disease) 02/15/2013  . Dyslipidemia 02/15/2013  . HAND, ARTHRITIS, DEGEN./OSTEO 12/01/2008  . TRIGGER FINGER 12/01/2008    Vangie Bicker, [place], OTR/L [phone]  01/09/2017, 9:47 AM  [place] [address] [place], [name], [address] Phone: [phone]   Fax:   [phone]  Name: [name] MRN: [id number] Date of Birth: 1950-03-22

## 2017-01-10 DIAGNOSIS — M19012 Primary osteoarthritis, left shoulder: Secondary | ICD-10-CM | POA: Diagnosis not present

## 2017-01-11 ENCOUNTER — Ambulatory Visit (HOSPITAL_COMMUNITY): Payer: Medicare Other

## 2017-01-11 DIAGNOSIS — R29898 Other symptoms and signs involving the musculoskeletal system: Secondary | ICD-10-CM | POA: Diagnosis not present

## 2017-01-11 DIAGNOSIS — M25512 Pain in left shoulder: Secondary | ICD-10-CM | POA: Diagnosis not present

## 2017-01-11 DIAGNOSIS — M25612 Stiffness of left shoulder, not elsewhere classified: Secondary | ICD-10-CM

## 2017-01-11 NOTE — Therapy (Signed)
[name], [name], [address] Phone: [phone]   Fax:  [phone]  Occupational Therapy Treatment And reassessment Patient Details  Name: [name] MRN: [id number] Date of Birth: 1950/08/27 Referring Provider: Dr. Ninetta Lights   Encounter Date: 01/11/2017  OT End of Session - 01/11/17 0854    Visit Number  10    Number of Visits  12    Authorization Type  Medicare part b    Authorization Time Period  before 17th visit    Authorization - Visit Number  10    Authorization - Number of Visits  17    OT Start Time  0820 reassess and discharge    OT Stop Time  0854    OT Time Calculation (min)  34 min    Activity Tolerance  Patient tolerated treatment well    Behavior During Therapy  [facility] for tasks assessed/performed       Past Medical History:  Diagnosis Date  . Anxiety   . Articular cartilage disorder involving shoulder region, left 11/2016  . Bursitis of shoulder, left 11/2016  . Complication of anesthesia    hx. of heart rate dropping during 2 surgeries   (2004- back surgery and with Lithotrispy )  . Crohn's disease ([place])   . Depression   . Dyspnea    with exertion  . History of hepatitis    unknown type - 1970s  . History of kidney stones   . Hyperlipidemia    unable to tolerat statins  . Hypertension    states under control with meds., has been on med. since 1994  . Immature cataract of both eyes   . Limited joint range of motion    cervical spine  . Myocardial infarct ([place]) 09/13/1992  . Nonobstructive atherosclerosis of coronary artery   . Numbness in both hands   . Obesity   . Osteoarthritis 11/2016   left shoulder  . Rotator cuff tear, left 11/2016  . Sleep apnea    uses BiPAP, but having issues with mask not sealing   . Thyroid nodule     Past Surgical History:  Procedure Laterality Date  . ANAL FISTULOTOMY N/A 06/28/2013   Procedure: ANAL FISTULOTOMY;  Surgeon: [name], MD;  Location: [facility];  Service: General;  Laterality: N/A;  . ANAL FISTULOTOMY  2017  . ANTERIOR CERVICAL DECOMP/DISCECTOMY FUSION N/A 11/12/2015   Procedure: ANTERIOR CERVICAL DECOMPRESSION/DISCECTOMY FUSION CERVICAL THREE- CERVICAL FOUR, CERVICAL FOUR- CERVICAL FIVE;  Surgeon: Jovita Gamma, MD;  Location: [place];  Service: Neurosurgery;  Laterality: N/A;  ANTERIOR CERVICAL DECOMPRESSION/DISCECTOMY FUSION C3-C4, C4-C5  . BIOPSY THYROID    . CARDIAC CATHETERIZATION  1994, 12/25/2007   a. PTCA alone in 1994 b. RCA CTO w/ L-R collaterals, 40-50% prox RCA, 20% prox LAD; EF 50-55%, inferoapical HK  . CARDIAC CATHETERIZATION  04/02/2001  . CARDIAC CATHETERIZATION  11/07/2000  . CARDIAC CATHETERIZATION  09/24/1996  . CARPAL TUNNEL RELEASE Left 10/09/2014   Procedure: LEFT CARPAL TUNNEL RELEASE;  Surgeon: [name], MD;  Location: [place];  Service: Orthopedics;  Laterality: Left;  . CARPAL TUNNEL RELEASE Right 08/28/2003  . CAUTERY OF TURBINATES  11/05/2003  . COLONOSCOPY N/A 11/06/2014   RMR: Skipped Colonic ulcerations with significant ileocecal valve involvement and rectal sparing most consisitant with Crohns disease. Minimal non-steroidal drug use. Probable colonic lipoma  . CYSTECTOMY    . INCISION AND DRAINAGE ABSCESS Right 05/23/2012  Procedure: INCISION AND DRAINAGE ABSCESS;  Surgeon: [name], MD;  Location: [facility];  Service: General;  Laterality: Right;  . KNEE ARTHROSCOPY    . LITHOTRIPSY    . LUMBAR FUSION  02/18/2002  . LUMBAR LAMINECTOMY  02/18/2002   L4-5  . NASAL SEPTUM SURGERY  11/05/2003  . PARTIAL NEPHRECTOMY Left    age 56  . SHOULDER ARTHROSCOPY WITH SUBACROMIAL DECOMPRESSION, ROTATOR CUFF REPAIR AND BICEP TENDON REPAIR Left 11/30/2016   Procedure: LEFT SHOULDER ARTHROSCOPY, DEBRIDEMENT, ACROMIOPLASTY WITH DISTAL CLAVICAL EXCISION, POSSIBLE ROTATOR CUFF REPAIR AND BICEP TENODESIS;  Surgeon: Ninetta Lights, MD;  Location: [place];  Service:  Orthopedics;  Laterality: Left;  . SPLENECTOMY     age 31  . TRANSTHORACIC ECHOCARDIOGRAM  03/05/2009   EF >55%, normal LV wall thickness.    There were no vitals filed for this visit.  Subjective Assessment - 01/11/17 0846    Subjective   S: The doctor said I could do exercises at home and not have to come to therapy.    Currently in Pain?  No/denies         [facility] OT Assessment - 01/11/17 0823      Assessment   Diagnosis  Left Shoulder Partial Rotator Cuff Repair, SAD/DCE Biceps tendon Repair      Precautions   Precautions  Shoulder    Type of Shoulder Precautions  progress with AA/ROM and A/ROM as tolerated      AROM   Overall AROM Comments  Assessed seated. IR/er adducted. Assessed supine previously.    AROM Assessment Site  Shoulder    Right/Left Shoulder  Left    Left Shoulder Flexion  150 Degrees previous: 150 supine    Left Shoulder ABduction  160 Degrees previous: 160 supne    Left Shoulder Internal Rotation  90 Degrees previous: same    Left Shoulder External Rotation  75 Degrees previous: 35 supine      PROM   Overall PROM Comments  Assessed supine. IR/er adducted    PROM Assessment Site  Shoulder    Right/Left Shoulder  Left    Left Shoulder Flexion  168 Degrees previous: same    Left Shoulder ABduction  180 Degrees previous: 175    Left Shoulder Internal Rotation  90 Degrees previous: same    Left Shoulder External Rotation  71 Degrees previous: 60      Strength   Overall Strength Comments  assessed seated. IR/er adducted    Strength Assessment Site  Shoulder    Right/Left Shoulder  Left    Left Shoulder Flexion  5/5 previous; 4-/5    Left Shoulder ABduction  5/5 previous: 4-/5    Left Shoulder Internal Rotation  5/5 previous: 4-/5    Left Shoulder External Rotation  5/5 previous: 4-/5               OT Treatments/Exercises (OP) - 01/11/17 0847      Exercises   Exercises  Shoulder      Shoulder Exercises: Supine   Protraction  PROM;5 reps     Horizontal ABduction  PROM;5 reps    External Rotation  PROM;5 reps    Internal Rotation  PROM;5 reps    Flexion  PROM;5 reps    ABduction  PROM;5 reps      Shoulder Exercises: Standing   Horizontal ABduction  Theraband;10 reps    Theraband Level (Shoulder Horizontal ABduction)  Level 3 (Green)    External Rotation  Theraband;10 reps  Theraband Level (Shoulder External Rotation)  Level 3 (Green)    Internal Rotation  Theraband;10 reps    Theraband Level (Shoulder Internal Rotation)  Level 3 (Green)    Flexion  Theraband;10 reps    Theraband Level (Shoulder Flexion)  Level 3 (Green)    ABduction  Theraband;10 reps    Theraband Level (Shoulder ABduction)  Level 3 (Green)      Shoulder Exercises: Stretch   Wall Stretch - Flexion  2 reps 15 seconds             OT Education - 01/11/17 0846    Education provided  Yes    Education Details  shoulder flexion stretch and green theraband strengthening.    Person(s) Educated  Patient    Methods  Explanation;Demonstration;Verbal cues;Handout    Comprehension  Verbalized understanding;Returned demonstration       OT Short Term Goals - 01/11/17 0837      OT SHORT TERM GOAL #1   Title  Patient will be educated on HEP for improved shoulder mobility.    Time  3    Period  Weeks    Status  Achieved      OT SHORT TERM GOAL #2   Title  Patient will improve left shoulder P/ROM to WNL for improved ability to don and doff shirts.    Time  3    Period  Weeks      OT SHORT TERM GOAL #3   Title  Patient will improve left shoulder strength to 3+/5 for improved ability to lift car parts.    Time  3    Period  Weeks      OT SHORT TERM GOAL #4   Title  Patient will decrease pain to 3/10 or better in left shoulder during functional acitivities.     Time  3    Period  Weeks        OT Long Term Goals - 01/11/17 1610      OT LONG TERM GOAL #1   Title  Patient will return to prior level of independence with all B/IADLs and leisure  activities using left arm.    Time  6    Period  Weeks    Status  Achieved      OT LONG TERM GOAL #2   Title  Patient will improve left shoulder A/ROM to WNL for improved ability to reach overhead when working on cars.     Time  6    Period  Weeks    Status  Achieved      OT LONG TERM GOAL #3   Title  Patient will improve left shoulder strength to 5/5 for improved ability to lift car parts and lawn equipment.     Time  6    Period  Weeks    Status  Achieved      OT LONG TERM GOAL #4   Title  Patient will have 2/10 pain or less in left shoulder when completing functional activities.    Time  6    Period  Weeks    Status  Achieved      OT LONG TERM GOAL #5   Title  Patient will have trace fascial restrictions in his left shoulder for greater mobility during functional activities.     Time  6    Period  Weeks    Status  Achieved            Plan - 01/11/17 9604  Clinical Impression Statement  A: reassessment and discharge this session. patient had a follow up visit with his MD yesterday which went well. he was released. MD recommended that he D/C from therapy with a HEP. HEP was updated and reviewed. patient is in agreement with discharge.     Plan  P: D/C from therapy with HEP.    Consulted and Agree with Plan of Care  Patient       Patient will benefit from skilled therapeutic intervention in order to improve the following deficits and impairments:  Decreased range of motion, Decreased strength, Increased fascial restrictions, Increased muscle spasms, Pain  Visit Diagnosis: Other symptoms and signs involving the musculoskeletal system  Acute pain of left shoulder  Stiffness of left shoulder, not elsewhere classified  G-Codes - 01/14/2017 0856    Functional Assessment Tool Used (Outpatient only)  FOTO score: 71/100 (29% impaired)    Functional Limitation  Carrying, moving and handling objects    Carrying, Moving and Handling Objects Goal Status (Z3007)  At least 1  percent but less than 20 percent impaired, limited or restricted    Carrying, Moving and Handling Objects Discharge Status [phone])  At least 20 percent but less than 40 percent impaired, limited or restricted       Problem List Patient Active Problem List   Diagnosis Date Noted  . Cervical spinal stenosis 11/12/2015  . Crohn disease ([place]) 01/20/2015  . Rectal fistula 01/20/2015  . Noninfectious gastroenteritis, unspecified   . Rectal bleeding 10/21/2014  . Bowel habit changes 10/21/2014  . Obesity (BMI 30.0-34.9) 05/18/2014  . OSA (obstructive sleep apnea) 03/30/2013  . Chest pain 02/15/2013  . HTN (hypertension) 02/15/2013  . CAD (coronary artery disease) 02/15/2013  . Dyslipidemia 02/15/2013  . HAND, ARTHRITIS, DEGEN./OSTEO 12/01/2008  . TRIGGER FINGER 12/01/2008    OCCUPATIONAL THERAPY DISCHARGE SUMMARY  Visits from Start of Care: 10  Current functional level related to goals / functional outcomes: All goals met. See above   Remaining deficits: Pt reports decreased endurance in his left shoulder during daily tasks. Strength is 5/5.    Education / Equipment: See above Plan: Patient agrees to discharge.  Patient goals were not met. Patient is being discharged due to meeting the stated rehab goals.  ?????         Ailene Ravel, OTR/L,CBIS  [phone]  01/14/17, 8:57 AM  [place], [name], [address] Phone: [phone]   Fax:  [phone]  Name: [name] MRN: [id number] Date of Birth: 07/28/1950

## 2017-01-11 NOTE — Patient Instructions (Signed)
    Wall Flexion  Slide your arm up the wall or door frame until a stretch is felt in your shoulder . Hold for 15-30 seconds. Complete 2 times      *Complete the band exercises every other day. Complete 10-15 repetitions. 1 set.  Strengthening: Chest Pull - Resisted   Hold Theraband in front of body with hands about shoulder width a part. Pull band a part and back together slowly. Repeat ____ times. Complete ____ set(s) per session.. Repeat ____ session(s) per day.  http://orth.exer.us/926   Copyright  VHI. All rights reserved.   PNF Strengthening: Resisted   Standing with resistive band around each hand, bring right arm up and away, thumb back. Repeat ____ times per set. Do ____ sets per session. Do ____ sessions per day.        Resisted External Rotation: in Neutral - Bilateral   Sit or stand, tubing in both hands, elbows at sides, bent to 90, forearms forward. Pinch shoulder blades together and rotate forearms out. Keep elbows at sides. Repeat ____ times per set. Do ____ sets per session. Do ____ sessions per day.  http://orth.exer.us/966   Copyright  VHI. All rights reserved.   PNF Strengthening: Resisted   Standing, hold resistive band above head. Bring right arm down and out from side. Repeat ____ times per set. Do ____ sets per session. Do ____ sessions per day.  http://orth.exer.us/922   Copyright  VHI. All rights reserved.

## 2017-01-12 ENCOUNTER — Telehealth: Payer: Self-pay | Admitting: Cardiovascular Disease

## 2017-01-12 MED ORDER — NITROGLYCERIN 0.4 MG SL SUBL: 0.4000 mg | SUBLINGUAL_TABLET | SUBLINGUAL | 3 refills | Status: DC | PRN

## 2017-01-12 NOTE — Telephone Encounter (Signed)
New message     *STAT* If patient is at the pharmacy, call can be transferred to refill team.   1. Which medications need to be refilled? (please list name of each medication and dose if known) nitroGLYCERIN (NITROSTAT) 0.4 MG SL tablet  2. Which pharmacy/location (including street and city if local pharmacy) is medication to be sent to? [facility]- [place]  3. Do they need a 30 day or 90 day supply? [place]

## 2017-01-16 ENCOUNTER — Encounter (HOSPITAL_COMMUNITY): Payer: Medicare Other | Admitting: Specialist

## 2017-01-18 ENCOUNTER — Encounter (HOSPITAL_COMMUNITY): Payer: Medicare Other

## 2017-02-14 ENCOUNTER — Encounter: Payer: Self-pay | Admitting: Internal Medicine

## 2017-03-16 DIAGNOSIS — I10 Essential (primary) hypertension: Secondary | ICD-10-CM | POA: Diagnosis not present

## 2017-03-16 DIAGNOSIS — E785 Hyperlipidemia, unspecified: Secondary | ICD-10-CM | POA: Diagnosis not present

## 2017-03-16 LAB — COMPREHENSIVE METABOLIC PANEL
ALT: 19 IU/L (ref 0–44)
AST: 18 IU/L (ref 0–40)
Albumin/Globulin Ratio: 1.3 (ref 1.2–2.2)
Albumin: 4.1 g/dL (ref 3.6–4.8)
Alkaline Phosphatase: 96 IU/L (ref 39–117)
BUN/Creatinine Ratio: 12 (ref 10–24)
BUN: 10 mg/dL (ref 8–27)
Bilirubin Total: 0.3 mg/dL (ref 0.0–1.2)
CO2: 23 mmol/L (ref 20–29)
Calcium: 9.2 mg/dL (ref 8.6–10.2)
Chloride: 105 mmol/L (ref 96–106)
Creatinine, Ser: 0.85 mg/dL (ref 0.76–1.27)
GFR calc Af Amer: 104 mL/min/{1.73_m2} (ref >59)
GFR calc non Af Amer: 90 mL/min/{1.73_m2} (ref >59)
Globulin, Total: 3.1 g/dL (ref 1.5–4.5)
Glucose: 92 mg/dL (ref 65–99)
Potassium: 4.7 mmol/L (ref 3.5–5.2)
Sodium: 143 mmol/L (ref 134–144)
Total Protein: 7.2 g/dL (ref 6.0–8.5)

## 2017-03-16 LAB — LIPID PANEL
Chol/HDL Ratio: 5.5 ratio — ABNORMAL HIGH (ref 0.0–5.0)
Cholesterol, Total: 188 mg/dL (ref 100–199)
HDL: 34 mg/dL — ABNORMAL LOW (ref >39)
LDL Calculated: 126 mg/dL — ABNORMAL HIGH (ref 0–99)
Triglycerides: 140 mg/dL (ref 0–149)
VLDL Cholesterol Cal: 28 mg/dL (ref 5–40)

## 2017-04-20 ENCOUNTER — Ambulatory Visit (INDEPENDENT_AMBULATORY_CARE_PROVIDER_SITE_OTHER): Payer: Medicare Other | Admitting: Otolaryngology

## 2017-04-20 DIAGNOSIS — H903 Sensorineural hearing loss, bilateral: Secondary | ICD-10-CM

## 2017-04-20 DIAGNOSIS — H6123 Impacted cerumen, bilateral: Secondary | ICD-10-CM | POA: Diagnosis not present

## 2017-06-30 DIAGNOSIS — N529 Male erectile dysfunction, unspecified: Secondary | ICD-10-CM | POA: Diagnosis not present

## 2017-06-30 DIAGNOSIS — M4802 Spinal stenosis, cervical region: Secondary | ICD-10-CM | POA: Diagnosis not present

## 2017-06-30 DIAGNOSIS — Z125 Encounter for screening for malignant neoplasm of prostate: Secondary | ICD-10-CM | POA: Diagnosis not present

## 2017-06-30 DIAGNOSIS — M1991 Primary osteoarthritis, unspecified site: Secondary | ICD-10-CM | POA: Diagnosis not present

## 2017-06-30 DIAGNOSIS — E782 Mixed hyperlipidemia: Secondary | ICD-10-CM | POA: Diagnosis not present

## 2017-06-30 DIAGNOSIS — Z1389 Encounter for screening for other disorder: Secondary | ICD-10-CM | POA: Diagnosis not present

## 2017-06-30 DIAGNOSIS — G894 Chronic pain syndrome: Secondary | ICD-10-CM | POA: Diagnosis not present

## 2017-06-30 DIAGNOSIS — R202 Paresthesia of skin: Secondary | ICD-10-CM | POA: Diagnosis not present

## 2017-06-30 DIAGNOSIS — E748 Other specified disorders of carbohydrate metabolism: Secondary | ICD-10-CM | POA: Diagnosis not present

## 2017-06-30 DIAGNOSIS — Z6834 Body mass index (BMI) 34.0-34.9, adult: Secondary | ICD-10-CM | POA: Diagnosis not present

## 2017-06-30 DIAGNOSIS — E6609 Other obesity due to excess calories: Secondary | ICD-10-CM | POA: Diagnosis not present

## 2017-07-04 DIAGNOSIS — E748 Other specified disorders of carbohydrate metabolism: Secondary | ICD-10-CM | POA: Diagnosis not present

## 2017-07-17 ENCOUNTER — Ambulatory Visit (INDEPENDENT_AMBULATORY_CARE_PROVIDER_SITE_OTHER): Payer: Medicare Other | Admitting: Cardiovascular Disease

## 2017-07-17 ENCOUNTER — Encounter: Payer: Self-pay | Admitting: Cardiovascular Disease

## 2017-07-17 VITALS — BP 128/84 | HR 64 | Ht 69.0 in | Wt 224.0 lb

## 2017-07-17 DIAGNOSIS — G4733 Obstructive sleep apnea (adult) (pediatric): Secondary | ICD-10-CM | POA: Diagnosis not present

## 2017-07-17 DIAGNOSIS — Z789 Other specified health status: Secondary | ICD-10-CM

## 2017-07-17 DIAGNOSIS — E785 Hyperlipidemia, unspecified: Secondary | ICD-10-CM

## 2017-07-17 DIAGNOSIS — I2510 Atherosclerotic heart disease of native coronary artery without angina pectoris: Secondary | ICD-10-CM

## 2017-07-17 DIAGNOSIS — I10 Essential (primary) hypertension: Secondary | ICD-10-CM | POA: Diagnosis not present

## 2017-07-17 NOTE — Patient Instructions (Signed)
Medication Instructions:  Your physician recommends that you continue on your current medications as directed. Please refer to the Current Medication list given to you today.  Pharmacist will initiate the process to start [place]: 4-5 months with Dr. Claiborne Billings  Any Other Special Instructions Will Be Listed Below (If Applicable).  Restart CPAP --we will send orders to your DME company for a new mask   If you need a refill on your cardiac medications before your next appointment, please call your pharmacy.

## 2017-07-17 NOTE — Progress Notes (Signed)
Patient ID: DEMORRIS CHOYCE, male   DOB: 1950-03-11, 67 y.o.   MRN: [id number]     HPI: HALSEY HAMMEN is a 67 y.o. male who is a former patient of Dr. Rollene Fare who presents for a 6 month follow-up cardiology/sleep evaluation.  Mr. Zambito has CAD and suffered an inferior wall MI in 1994.  Cardiac catheterization revealed RCA occlusion with left-to-right collaterals. He was treated initially with PTCA of his RCA. His last cardiac catheterization was in 2009 which showed an RCA occlusion with collaterals with no significant LAD disease, although there was mild 20% diagonal stenosis.  An echo Doppler study done in August 2014 by Dr. Rollene Fare showed mild LVH with mild hypokinesis of the inferolateral wall but with preserved global systolic function with an EF of 55-60%. He had normal diastolic parameters.  In January 2015 he was admitted by the hospitalist service with chest pain. A stress Myoview study did not demonstrate convincing inducible myocardial ischemia. There was limited evaluation of the inferior wall due to movement and overlap of the diaphragm and activity within the left lobe of the liver. Ejection fraction was 47%. He was seen by Dr. Rayann Heman for cardiology consultation who recommended no further cardiac workup.  Lipid evaluation reveals a total cholesterol 163 triglycerides 100 HDL 31 LDL 112 in the past, he had some difficulty taking statins.  Additional problems include hypertension, obesity, and arthritis. He has a remote history of right buttock abscess leading to hospitalization in December 2013.  When I initially saw him, I discontinued his gemfibrozil and recommended a trial of Livalo to take and hopefully tolerate  along with coenzyme Q10.  I titrated his amlodipine to 5 mg daily for more optimal blood pressure control.  I was also very concerned about obstructive sleep apnea and referred him for a sleep study.  Due to my concerns for obstructive sleep apnea, he underwent a split-night  protocol, on 05/10/2012.  This revealed severe sleep apnea with an AHI of 75.6 per hour.  During REM sleep this was 40 per hour.  He had significant oxygen desaturation to 78% with non-REM sleep and 76% with REM sleep.  He also had a significant positional component to his sleep apnea.  He was started on CPAP titration but due to high-pressure he was was switched to a BiPAP mode.  Ultimately, a BiPAP auto unit was recommended with an EPAP minimum of 10 and a IPAP maximum up to 25.  He also did have periodic lid movements with sleep with an index of 18.  He apparently initially used BiPAP for only 3 weeks but was having difficulty with his mass.  However, before  being seen for f/u he returned his BiPAP unit due to concerns that he was not compliant and would have to pay for this without insurance.  His sleep was very poor, he was unable to sleep on his back, and snored loudly.  I referred him for a follow-up sleep study which was done on 08/10/2015 which confirmed severe sleep apnea with an AHI of 95.7.  He ultimately underwent CPAP and then BiPAP titration and was titrated up to 17/13.  A BiPAP auto therapy was recommended with an EPAP minimum of 10 and IPAP maximum of 25.  He received his new BiPAP machine on 09/05/2015 and is compliant until the very beginning of October.  I reviewed his ResMed compliance data, which documents compliance, both for usage stays as well as usage greater than 4 hours.  However, on  10/05/2017bhe underwent cervical neck surgery by Dr. Rita Ohara and when he was wearing his cervical collar was not able to use his CPAP. His DME company is [employer].  He was diagnosed with Crohn's disease following a colonoscopy.  He has had issues with anal fissures and fistula.  Since I last saw him, he underwent surgery for an anal fistula repair in January 2018.  He tolerated that well from a cardiovascular standpoint.  He denies any episodes of chest pain or palpitations.  He continues to  use CPAP but has been having difficulty with his full face mask.  I obtained a download in the office from 06/06/2016 through 07/05/2016.  That time, he was only using treatment for 3 hours and 33 minutes per night. He has an ResMed AirCurve 10 Auto BiPAP  unit with maximum average IPAP pressure at 17 EPAP pressure of 13.  He had significant mask leak, I recommended a trial of a new dream where fullface mask.  When you receive this, he had an incredible night sleep.  The first night.  However, after the first night he had difficulty with mask adjustment to his nose region.  As result, his only been intermittently using therapy.   He has been intolerant to Crestor, Lipitor, Zocor, Pravachol, and Livalo.  When checked by Dr. Gerarda Fraction in September 2017 LDL cholesterol was 161 with a total cholesterol 225.  He underwent left shoulder arthroscopy with subacromial decompression, rotator cuff repair and bicep tendon repair by Dr. Amada Jupiter on 11/30/2016.  On 12/31/2016 he presented to the emergency room with vertigo symptoms.  Apparently, a CT  was negative and there was some response to meclizine.  He was given information regarding Epley maneuvers to try at home.  I last saw him in November 2018 he denied any chest pain, PND, orthopnea, or palpitations.  On Jun 30, 2017 he had undergone repeat laboratory which showed a total cholesterol of 214, HDL 33, LDL 147, triglycerides 109.  Trial of Zetia had been recommended but apparently he did not do this since he states in the past he did not derive any significant benefit.  He stopped using CPAP again concerns with his mask.  He presents for evaluation.  Past Medical History:  Diagnosis Date  . Anxiety   . Articular cartilage disorder involving shoulder region, left 11/2016  . Bursitis of shoulder, left 11/2016  . Complication of anesthesia    hx. of heart rate dropping during 2 surgeries   (2004- back surgery and with Lithotrispy )  . Crohn's disease ([place])   .  Depression   . Dyspnea    with exertion  . History of hepatitis    unknown type - 1970s  . History of kidney stones   . Hyperlipidemia    unable to tolerat statins  . Hypertension    states under control with meds., has been on med. since 1994  . Immature cataract of both eyes   . Limited joint range of motion    cervical spine  . Myocardial infarct ([place]) 09/13/1992  . Nonobstructive atherosclerosis of coronary artery   . Numbness in both hands   . Obesity   . Osteoarthritis 11/2016   left shoulder  . Rotator cuff tear, left 11/2016  . Sleep apnea    uses BiPAP, but having issues with mask not sealing   . Thyroid nodule     Past Surgical History:  Procedure Laterality Date  . ANAL FISTULOTOMY N/A 06/28/2013  Procedure: ANAL FISTULOTOMY;  Surgeon: [name], MD;  Location: [facility];  Service: General;  Laterality: N/A;  . ANAL FISTULOTOMY  2017  . ANTERIOR CERVICAL DECOMP/DISCECTOMY FUSION N/A 11/12/2015   Procedure: ANTERIOR CERVICAL DECOMPRESSION/DISCECTOMY FUSION CERVICAL THREE- CERVICAL FOUR, CERVICAL FOUR- CERVICAL FIVE;  Surgeon: Jovita Gamma, MD;  Location: [facility];  Service: Neurosurgery;  Laterality: N/A;  ANTERIOR CERVICAL DECOMPRESSION/DISCECTOMY FUSION C3-C4, C4-C5  . BIOPSY THYROID    . CARDIAC CATHETERIZATION  1994, 12/25/2007   a. PTCA alone in 1994 b. RCA CTO w/ L-R collaterals, 40-50% prox RCA, 20% prox LAD; EF 50-55%, inferoapical HK  . CARDIAC CATHETERIZATION  04/02/2001  . CARDIAC CATHETERIZATION  11/07/2000  . CARDIAC CATHETERIZATION  09/24/1996  . CARPAL TUNNEL RELEASE Left 10/09/2014   Procedure: LEFT CARPAL TUNNEL RELEASE;  Surgeon: [name], MD;  Location: [facility];  Service: Orthopedics;  Laterality: Left;  . CARPAL TUNNEL RELEASE Right 08/28/2003  . CAUTERY OF TURBINATES  11/05/2003  . COLONOSCOPY N/A 11/06/2014   RMR: Skipped Colonic ulcerations with significant ileocecal valve involvement and rectal sparing most consisitant with  Crohns disease. Minimal non-steroidal drug use. Probable colonic lipoma  . CYSTECTOMY    . INCISION AND DRAINAGE ABSCESS Right 05/23/2012   Procedure: INCISION AND DRAINAGE ABSCESS;  Surgeon: [name], MD;  Location: [facility];  Service: General;  Laterality: Right;  . KNEE ARTHROSCOPY    . LITHOTRIPSY    . LUMBAR FUSION  02/18/2002  . LUMBAR LAMINECTOMY  02/18/2002   L4-5  . NASAL SEPTUM SURGERY  11/05/2003  . PARTIAL NEPHRECTOMY Left    age 36  . SHOULDER ARTHROSCOPY WITH SUBACROMIAL DECOMPRESSION, ROTATOR CUFF REPAIR AND BICEP TENDON REPAIR Left 11/30/2016   Procedure: LEFT SHOULDER ARTHROSCOPY, DEBRIDEMENT, ACROMIOPLASTY WITH DISTAL CLAVICAL EXCISION, POSSIBLE ROTATOR CUFF REPAIR AND BICEP TENODESIS;  Surgeon: Ninetta Lights, MD;  Location: [facility];  Service: Orthopedics;  Laterality: Left;  . SPLENECTOMY     age 87  . TRANSTHORACIC ECHOCARDIOGRAM  03/05/2009   EF >55%, normal LV wall thickness.    Allergies  Allergen Reactions  . Niacin And Related Other (See Comments)    FLUSHING  . Penicillins Hives and Itching    Has patient had a PCN reaction causing immediate rash, facial/tongue/throat swelling, SOB or lightheadedness with hypotension: Unknown Has patient had a PCN reaction causing severe rash involving mucus membranes or skin necrosis: Unknown Has patient had a PCN reaction that required hospitalization: Unknown Has patient had a PCN reaction occurring within the last 10 years: No If all of the above answers are "NO", then may proceed with Cephalosporin use.   . Statins Other (See Comments)    MYALGIAS    Current Outpatient Medications  Medication Sig Dispense Refill  . ALPRAZolam (XANAX) 0.5 MG tablet Take 1 tablet (0.5 mg total) by mouth 3 (three) times daily as needed (dizziness). 21 tablet 0  . amLODipine (NORVASC) 5 MG tablet Take 1 tablet (5 mg total) by mouth daily. 180 tablet 3  . carbamide peroxide (DEBROX) 6.5 % OTIC solution Place 5 drops into both ears 2  (two) times daily as needed (FOR EAR WAX REMOVAL).    Marland Kitchen metoprolol succinate (TOPROL-XL) 50 MG 24 hr tablet Take 50 mg by mouth daily. Take with or immediately following a meal.    . nitroGLYCERIN (NITROSTAT) 0.4 MG SL tablet Place 1 tablet (0.4 mg total) under the tongue every 5 (five) minutes as needed for chest pain. 25 tablet 3  .  Probiotic Product (PROBIOTIC DAILY PO) Take 1 capsule by mouth daily. [place]    . Psyllium Husk POWD Take 12 g by mouth daily. INTO 12 OUNCES OF WATER (1 TABLESPOON)    . ramipril (ALTACE) 10 MG capsule Take 10 mg by mouth daily.     No current facility-administered medications for this visit.     Social History   Socioeconomic History  . Marital status: Married    Spouse name: Not on file  . Number of children: Not on file  . Years of education: Not on file  . Highest education level: Not on file  Occupational History  . Not on file  Social Needs  . Financial resource strain: Not on file  . Food insecurity:    Worry: Not on file    Inability: Not on file  . Transportation needs:    Medical: Not on file    Non-medical: Not on file  Tobacco Use  . Smoking status: Former Smoker    Packs/day: 0.00    Years: 18.00    Pack years: 0.00    Last attempt to quit: 02/06/1993    Years since quitting: 24.4  . Smokeless tobacco: Former Network engineer and Sexual Activity  . Alcohol use: No    Alcohol/week: 0.0 oz  . Drug use: No  . Sexual activity: Yes  Lifestyle  . Physical activity:    Days per week: Not on file    Minutes per session: Not on file  . Stress: Not on file  Relationships  . Social connections:    Talks on phone: Not on file    Gets together: Not on file    Attends religious service: Not on file    Active member of club or organization: Not on file    Attends meetings of clubs or organizations: Not on file    Relationship status: Not on file  . Intimate partner violence:    Fear of current or ex partner: Not on file     Emotionally abused: Not on file    Physically abused: Not on file    Forced sexual activity: Not on file  Other Topics Concern  . Not on file  Social History Narrative   Lives in [place], [name]. Married, 2 children.     Family History  Problem Relation Age of Onset  . CAD Father        CABG   . CAD Brother        CABG in 37s  . CAD Brother   . Arthritis/Rheumatoid Mother   . Hyperlipidemia Mother   . Thrombocytopenia Mother   . COPD Son        smoker  . Alzheimer's disease Maternal Grandmother   . Lung disease Paternal Grandfather    ROS General: Negative; No fevers, chills, or night sweats HEENT: Negative; No changes in vision or hearing, sinus congestion, difficulty swallowing Pulmonary: Negative; No cough, wheezing, shortness of breath, hemoptysis Cardiovascular: Negative; No chest pain, presyncope, syncope, palpatations GI: Negative; No nausea, vomiting, diarrhea, or abdominal pain GU: Negative; No dysuria, hematuria, or difficulty voiding Musculoskeletal: Positive for cervical disc disease. Hematologic: Negative; no easy bruising, bleeding Endocrine: Negative; no heat/cold intolerance Neuro: Negative; no changes in balance, headaches Skin: Negative; No rashes or skin lesions Psychiatric: Negative; No behavioral problems, depression Sleep: positive severe sleep apnea, Now having received another BiPAP unit  daytime sleepiness, hypersomnolence, and snoring, no bruxism, he admits to limb movement, but denies painful restless legs,  no hypnogognic hallucinations, no cataplexy   PE BP 128/84   Pulse 64   Ht '5\' 9"'  (1.753 m)   Wt 224 lb (101.6 kg)   BMI 33.08 kg/m   Repeat blood pressure was 124/80  Wt Readings from Last 3 Encounters:  07/17/17 224 lb (101.6 kg)  01/03/17 221 lb 9.6 oz (100.5 kg)  12/31/16 230 lb (104.3 kg)   General: Alert, oriented, no distress.  Skin: normal turgor, no rashes, warm and dry HEENT: Normocephalic, atraumatic. Pupils equal round  and reactive to light; sclera anicteric; extraocular muscles intact;  Nose without nasal septal hypertrophy Mouth/Parynx benign; Mallinpatti scale Neck: Thick neck; no JVD, no carotid bruits; normal carotid upstroke Lungs: clear to ausculatation and percussion; no wheezing or rales Chest wall: without tenderness to palpitation Heart: PMI not displaced, RRR, s1 s2 normal, 1/6 systolic murmur, no diastolic murmur, no rubs, gallops, thrills, or heaves Abdomen: soft, nontender; no hepatosplenomehaly, BS+; abdominal aorta nontender and not dilated by palpation. Back: no CVA tenderness Pulses 2+ Musculoskeletal: full range of motion, normal strength, no joint deformities Extremities: no clubbing cyanosis or edema, Homan's sign negative  Neurologic: grossly nonfocal; Cranial nerves grossly wnl Psychologic: Normal mood and affect   ECG (independently read by me): Normal sinus rhythm at 64 bpm with first-degree AV block.  PR interval 210 ms.  No ectopy.  November 2018 ECG (independently read by me): Normal sinus rhythm at 65 bpm.  First-degree AV block with a PR interval of 212 ms.  No syncope.  No ST segment changes.  May 2018 ECG (independently read by me): Normal sinus rhythm at 65 bpm.  Normal intervals.  No ectopy.  Small nondiagnostic inferior Q waves   October 2017 ECG (independently read by me): Sinus rhythm with first-degree AV block.  Will intervals.  May 2017 ECG (independently read by me): Normal sinus rhythm at 73 bpm.  Small nondiagnostic inferior Q waves.  No ST segment changes.  April 2016 ECG (independently read by me): Normal sinus rhythm at 68 bpm.  Nonspecific T changes.  Normal intervals.  April 2015 ECG (independently read by me): Normal sinus rhythm at 61 beats per minute.  Normal intervals  Prior 03/28/2013 ECG (independently read by me): Sinus rhythm at 66 beats per minute. No ectopy. Q-wave in lead 3   LABS:  BMP Latest Ref Rng & Units 03/16/2017 12/31/2016 12/31/2016    Glucose 65 - 99 mg/dL 92 104(H) 102(H)  BUN 8 - 27 mg/dL '10 9 10  ' Creatinine 0.76 - 1.27 mg/dL 0.85 0.70 0.68  BUN/Creat Ratio 10 - 24 12 - -  Sodium 134 - 144 mmol/L 143 143 139  Potassium 3.5 - 5.2 mmol/L 4.7 3.7 3.7  Chloride 96 - 106 mmol/L 105 103 104  CO2 20 - 29 mmol/L 23 - 26  Calcium 8.6 - 10.2 mg/dL 9.2 - 9.0    Hepatic Function Latest Ref Rng & Units 03/16/2017 12/31/2016 12/18/2015  Total Protein 6.0 - 8.5 g/dL 7.2 7.8 7.2  Albumin 3.6 - 4.8 g/dL 4.1 4.1 3.9  AST 0 - 40 IU/L '18 23 15  ' ALT 0 - 44 IU/L '19 23 15  ' Alk Phosphatase 39 - 117 IU/L 96 106 103  Total Bilirubin 0.0 - 1.2 mg/dL 0.3 0.6 0.5    CBC Latest Ref Rng & Units 12/31/2016 12/31/2016 11/30/2016  WBC 4.0 - 10.5 K/uL - 6.3 6.7  Hemoglobin 13.0 - 17.0 g/dL 16.3 16.0 15.5  Hematocrit 39.0 - 52.0 % 48.0 48.8  45.0  Platelets 150 - 400 K/uL - 382 367    Lab Results  Component Value Date   MCV 91.7 12/31/2016   MCV 89.6 11/30/2016   MCV 87.8 12/18/2015    Lab Results  Component Value Date   TSH 1.315 06/10/2014   No results found for: HGBA1C   Lipid Panel     Component Value Date/Time   CHOL 188 03/16/2017 0857   TRIG 140 03/16/2017 0857   HDL 34 (L) 03/16/2017 0857   CHOLHDL 5.5 (H) 03/16/2017 0857   CHOLHDL 6.0 06/10/2014 0811   VLDL 34 06/10/2014 0811   LDLCALC 126 (H) 03/16/2017 0857   BNP No results found for: PROBNP   RADIOLOGY: No results found.  IMPRESSION:  1. CAD in native artery   2. Essential hypertension   3. OSA (obstructive sleep apnea)   4. Hyperlipidemia with target LDL less than 70   5. Statin intolerance     ASSESSMENT AND PLAN: Mr. Nael Petrosyan is a 67 year old gentleman who suffered an inferior wall myocardial infarction in 1994 due to total RCA occlusion with good left to right collaterals without significant contralateral disease. His last nuclear perfusion study did not demonstrate significant ischemia. However, ejection fraction was 47%. His echo Doppler study  did show inferolateral wall motion abnormality.  He has been fairly stable without recurrent anginal symptoms.  His blood pressure today is stable on his current regimen consisting of amlodipine 5 mg, Toprol-XL 50 mg, and ramipril 10 mg.  When I last saw him, I recommended a rechallenge of Crestor to try 5 mg weekly and that if tolerates to slightly titrate accordingly.  Apparently he cannot take statins.  I had a long discussion with him today regarding PCSK9 inhibition.  He is a candidate for Repatha.  I had Cyril Mourning come in to speak with him so she can try to obtain the medication with financial assistance.  Will initiate the paperwork.  I again discussed the importance of using CPAP therapy and stressed with him 100% compliance.  I will write him a prescription for a new ResMed air fit P 30i DreamWear like mask to see if this can improve his compliance.  Hopefully he will be able to initiate Repatha treatment.  If so repeat laboratory will be obtained in 3 months.  I will see him in 4 - 5 months for reevaluation.   Time spent: 25 minutes  Troy Sine, MD, [facility]  07/18/2017 6:37 PM

## 2017-07-18 ENCOUNTER — Encounter: Payer: Self-pay | Admitting: Cardiovascular Disease

## 2017-07-18 ENCOUNTER — Other Ambulatory Visit: Payer: Self-pay | Admitting: Cardiovascular Disease

## 2017-07-18 DIAGNOSIS — G4733 Obstructive sleep apnea (adult) (pediatric): Secondary | ICD-10-CM

## 2017-07-20 ENCOUNTER — Ambulatory Visit (INDEPENDENT_AMBULATORY_CARE_PROVIDER_SITE_OTHER): Payer: Medicare Other | Admitting: Otolaryngology

## 2017-07-20 DIAGNOSIS — H608X3 Other otitis externa, bilateral: Secondary | ICD-10-CM | POA: Diagnosis not present

## 2017-07-20 DIAGNOSIS — H6123 Impacted cerumen, bilateral: Secondary | ICD-10-CM | POA: Diagnosis not present

## 2017-07-27 ENCOUNTER — Telehealth: Payer: Self-pay | Admitting: *Deleted

## 2017-07-27 NOTE — Telephone Encounter (Signed)
Left message at home # her message has been received concerning the patient's mask fit. I will inform Dr Claiborne Billings of their issue and contact them back with further recommendations.

## 2017-07-28 ENCOUNTER — Other Ambulatory Visit: Payer: Self-pay | Admitting: Cardiovascular Disease

## 2017-07-28 DIAGNOSIS — G4733 Obstructive sleep apnea (adult) (pediatric): Secondary | ICD-10-CM

## 2017-07-31 ENCOUNTER — Other Ambulatory Visit: Payer: Self-pay | Admitting: Cardiovascular Disease

## 2017-07-31 ENCOUNTER — Telehealth: Payer: Self-pay | Admitting: *Deleted

## 2017-07-31 DIAGNOSIS — G4733 Obstructive sleep apnea (adult) (pediatric): Secondary | ICD-10-CM

## 2017-07-31 NOTE — Telephone Encounter (Signed)
Returned a call to patient's wife informing her that due to the therapist not being able to fit patient with previously prescribed Airfit P 30 due to very high pressures Dr Claiborne Billings is ordering a Airfit F-30. She voiced understanding and states that they will try to come to [facility] within the next week.

## 2017-08-03 ENCOUNTER — Telehealth: Payer: Self-pay | Admitting: *Deleted

## 2017-08-03 NOTE — Telephone Encounter (Signed)
Consider a retrial of the air fit F 30 mask if he has not tried it recently.  Try reducing EPAP min to 8 and IPAP max 18 cm water pressure.

## 2017-08-03 NOTE — Telephone Encounter (Signed)
Patient's wife called to say that they also tried the Airfit F-30 mask and it too also leaked. They request a pressure change. Message will be forwarded to Dr Claiborne Billings for review.

## 2017-08-03 NOTE — Telephone Encounter (Signed)
Patient's wife notified to retry the Airfit F-30. She was also informed of the pressure change.

## 2017-08-22 DIAGNOSIS — M4802 Spinal stenosis, cervical region: Secondary | ICD-10-CM | POA: Diagnosis not present

## 2017-08-22 DIAGNOSIS — M50223 Other cervical disc displacement at C6-C7 level: Secondary | ICD-10-CM | POA: Diagnosis not present

## 2017-08-22 DIAGNOSIS — M542 Cervicalgia: Secondary | ICD-10-CM | POA: Diagnosis not present

## 2017-08-22 DIAGNOSIS — R200 Anesthesia of skin: Secondary | ICD-10-CM | POA: Diagnosis not present

## 2017-08-22 DIAGNOSIS — Z135 Encounter for screening for eye and ear disorders: Secondary | ICD-10-CM | POA: Diagnosis not present

## 2017-08-22 DIAGNOSIS — Z981 Arthrodesis status: Secondary | ICD-10-CM | POA: Diagnosis not present

## 2017-08-22 DIAGNOSIS — M4712 Other spondylosis with myelopathy, cervical region: Secondary | ICD-10-CM | POA: Diagnosis not present

## 2017-08-22 DIAGNOSIS — M5030 Other cervical disc degeneration, unspecified cervical region: Secondary | ICD-10-CM | POA: Diagnosis not present

## 2017-08-28 DIAGNOSIS — R202 Paresthesia of skin: Secondary | ICD-10-CM | POA: Diagnosis not present

## 2017-08-28 DIAGNOSIS — R200 Anesthesia of skin: Secondary | ICD-10-CM | POA: Diagnosis not present

## 2017-08-28 DIAGNOSIS — M4712 Other spondylosis with myelopathy, cervical region: Secondary | ICD-10-CM | POA: Diagnosis not present

## 2017-08-28 DIAGNOSIS — Z981 Arthrodesis status: Secondary | ICD-10-CM | POA: Diagnosis not present

## 2017-08-28 DIAGNOSIS — M5000 Cervical disc disorder with myelopathy, unspecified cervical region: Secondary | ICD-10-CM | POA: Diagnosis not present

## 2017-08-28 DIAGNOSIS — M5030 Other cervical disc degeneration, unspecified cervical region: Secondary | ICD-10-CM | POA: Diagnosis not present

## 2017-08-30 DIAGNOSIS — Z1389 Encounter for screening for other disorder: Secondary | ICD-10-CM | POA: Diagnosis not present

## 2017-08-30 DIAGNOSIS — I2510 Atherosclerotic heart disease of native coronary artery without angina pectoris: Secondary | ICD-10-CM | POA: Diagnosis not present

## 2017-08-30 DIAGNOSIS — K5090 Crohn's disease, unspecified, without complications: Secondary | ICD-10-CM | POA: Diagnosis not present

## 2017-08-30 DIAGNOSIS — Z6834 Body mass index (BMI) 34.0-34.9, adult: Secondary | ICD-10-CM | POA: Diagnosis not present

## 2017-08-30 DIAGNOSIS — Z0001 Encounter for general adult medical examination with abnormal findings: Secondary | ICD-10-CM | POA: Diagnosis not present

## 2017-08-30 DIAGNOSIS — E6609 Other obesity due to excess calories: Secondary | ICD-10-CM | POA: Diagnosis not present

## 2017-08-30 DIAGNOSIS — M1991 Primary osteoarthritis, unspecified site: Secondary | ICD-10-CM | POA: Diagnosis not present

## 2017-08-30 DIAGNOSIS — I10 Essential (primary) hypertension: Secondary | ICD-10-CM | POA: Diagnosis not present

## 2017-09-14 ENCOUNTER — Other Ambulatory Visit: Payer: Self-pay | Admitting: Pharmacist Clinician (PhC)/ Clinical Pharmacy Specialist

## 2017-09-14 DIAGNOSIS — G5603 Carpal tunnel syndrome, bilateral upper limbs: Secondary | ICD-10-CM | POA: Diagnosis not present

## 2017-09-14 MED ORDER — ALIROCUMAB 150 MG/ML ~~LOC~~ SOPN: 150.0000 mg | PEN_INJECTOR | SUBCUTANEOUS | 12 refills | Status: DC

## 2017-09-18 DIAGNOSIS — G959 Disease of spinal cord, unspecified: Secondary | ICD-10-CM | POA: Diagnosis not present

## 2017-09-18 DIAGNOSIS — M5020 Other cervical disc displacement, unspecified cervical region: Secondary | ICD-10-CM | POA: Diagnosis not present

## 2017-09-18 DIAGNOSIS — M5412 Radiculopathy, cervical region: Secondary | ICD-10-CM | POA: Diagnosis not present

## 2017-09-18 DIAGNOSIS — M4712 Other spondylosis with myelopathy, cervical region: Secondary | ICD-10-CM | POA: Diagnosis not present

## 2017-09-18 DIAGNOSIS — M5030 Other cervical disc degeneration, unspecified cervical region: Secondary | ICD-10-CM | POA: Diagnosis not present

## 2017-09-18 DIAGNOSIS — G5603 Carpal tunnel syndrome, bilateral upper limbs: Secondary | ICD-10-CM | POA: Diagnosis not present

## 2017-10-25 DIAGNOSIS — G5603 Carpal tunnel syndrome, bilateral upper limbs: Secondary | ICD-10-CM | POA: Diagnosis not present

## 2017-10-31 ENCOUNTER — Other Ambulatory Visit: Payer: Self-pay | Admitting: Orthopedic Surgery

## 2017-10-31 DIAGNOSIS — G5603 Carpal tunnel syndrome, bilateral upper limbs: Secondary | ICD-10-CM

## 2017-11-05 ENCOUNTER — Ambulatory Visit
Admission: RE | Admit: 2017-11-05 | Discharge: 2017-11-05 | Disposition: A | Discharge status: Home / Self Care | Payer: Medicare Other | Source: Ambulatory Visit | Attending: Orthopedic Surgery | Admitting: Orthopedic Surgery

## 2017-11-05 DIAGNOSIS — G5603 Carpal tunnel syndrome, bilateral upper limbs: Secondary | ICD-10-CM

## 2017-11-05 DIAGNOSIS — R600 Localized edema: Secondary | ICD-10-CM | POA: Diagnosis not present

## 2017-11-05 DIAGNOSIS — S66901A Unspecified injury of unspecified muscle, fascia and tendon at wrist and hand level, right hand, initial encounter: Secondary | ICD-10-CM | POA: Diagnosis not present

## 2017-11-15 DIAGNOSIS — G5603 Carpal tunnel syndrome, bilateral upper limbs: Secondary | ICD-10-CM | POA: Diagnosis not present

## 2017-11-16 DIAGNOSIS — R52 Pain, unspecified: Secondary | ICD-10-CM | POA: Diagnosis not present

## 2017-11-16 DIAGNOSIS — G5603 Carpal tunnel syndrome, bilateral upper limbs: Secondary | ICD-10-CM | POA: Diagnosis not present

## 2017-11-17 DIAGNOSIS — Z23 Encounter for immunization: Secondary | ICD-10-CM | POA: Diagnosis not present

## 2017-12-03 ENCOUNTER — Telehealth: Payer: Self-pay | Admitting: Pharmacist Clinician (PhC)/ Clinical Pharmacy Specialist

## 2017-12-03 DIAGNOSIS — E785 Hyperlipidemia, unspecified: Secondary | ICD-10-CM

## 2017-12-03 NOTE — Telephone Encounter (Signed)
Mailed new PASSapplication and lab order 

## 2017-12-25 DIAGNOSIS — G5603 Carpal tunnel syndrome, bilateral upper limbs: Secondary | ICD-10-CM | POA: Diagnosis not present

## 2017-12-28 ENCOUNTER — Telehealth: Payer: Self-pay | Admitting: Pharmacist Clinician (PhC)/ Clinical Pharmacy Specialist

## 2017-12-28 NOTE — Telephone Encounter (Signed)
Patient has had problems with long term underlying rash.  Recently noted it seems to be worse and isn't sure if this is tied to taking Praluent.  His last dose on was on Sunday Nov 17.  He is due for repeat labs this week.  Advised that they get labs, but hold off on any further doses for 2-3 weeks.     Wife voiced understanding, they will contact us in 2-3 weeks to let us know if any changes to rash.

## 2017-12-29 ENCOUNTER — Other Ambulatory Visit: Payer: Self-pay | Admitting: Cardiovascular Disease

## 2017-12-29 DIAGNOSIS — E785 Hyperlipidemia, unspecified: Secondary | ICD-10-CM | POA: Diagnosis not present

## 2017-12-30 LAB — LIPID PANEL W/O CHOL/HDL RATIO
Cholesterol, Total: 117 mg/dL (ref 100–199)
HDL: 34 mg/dL — ABNORMAL LOW (ref >39)
LDL Calculated: 58 mg/dL (ref 0–99)
Triglycerides: 125 mg/dL (ref 0–149)
VLDL Cholesterol Cal: 25 mg/dL (ref 5–40)

## 2018-01-11 DIAGNOSIS — Z6834 Body mass index (BMI) 34.0-34.9, adult: Secondary | ICD-10-CM | POA: Diagnosis not present

## 2018-01-11 DIAGNOSIS — Z1389 Encounter for screening for other disorder: Secondary | ICD-10-CM | POA: Diagnosis not present

## 2018-01-11 DIAGNOSIS — T887XXA Unspecified adverse effect of drug or medicament, initial encounter: Secondary | ICD-10-CM | POA: Diagnosis not present

## 2018-01-11 DIAGNOSIS — M62830 Muscle spasm of back: Secondary | ICD-10-CM | POA: Diagnosis not present

## 2018-01-11 DIAGNOSIS — E669 Obesity, unspecified: Secondary | ICD-10-CM | POA: Diagnosis not present

## 2018-01-11 DIAGNOSIS — M545 Low back pain: Secondary | ICD-10-CM | POA: Diagnosis not present

## 2018-01-11 DIAGNOSIS — S39012A Strain of muscle, fascia and tendon of lower back, initial encounter: Secondary | ICD-10-CM | POA: Diagnosis not present

## 2018-01-11 DIAGNOSIS — M1991 Primary osteoarthritis, unspecified site: Secondary | ICD-10-CM | POA: Diagnosis not present

## 2018-01-11 DIAGNOSIS — M353 Polymyalgia rheumatica: Secondary | ICD-10-CM | POA: Diagnosis not present

## 2018-01-11 DIAGNOSIS — I10 Essential (primary) hypertension: Secondary | ICD-10-CM | POA: Diagnosis not present

## 2018-02-12 ENCOUNTER — Encounter

## 2018-02-12 ENCOUNTER — Encounter: Payer: Self-pay | Admitting: Cardiovascular Disease

## 2018-02-12 ENCOUNTER — Telehealth: Payer: Self-pay | Admitting: Pharmacist Clinician (PhC)/ Clinical Pharmacy Specialist

## 2018-02-12 ENCOUNTER — Ambulatory Visit (INDEPENDENT_AMBULATORY_CARE_PROVIDER_SITE_OTHER): Payer: Medicare Other | Admitting: Cardiovascular Disease

## 2018-02-12 VITALS — BP 118/70 | HR 70 | Ht 69.0 in | Wt 222.6 lb

## 2018-02-12 DIAGNOSIS — E785 Hyperlipidemia, unspecified: Secondary | ICD-10-CM | POA: Diagnosis not present

## 2018-02-12 DIAGNOSIS — G4733 Obstructive sleep apnea (adult) (pediatric): Secondary | ICD-10-CM | POA: Diagnosis not present

## 2018-02-12 DIAGNOSIS — Z789 Other specified health status: Secondary | ICD-10-CM

## 2018-02-12 DIAGNOSIS — R21 Rash and other nonspecific skin eruption: Secondary | ICD-10-CM

## 2018-02-12 DIAGNOSIS — I10 Essential (primary) hypertension: Secondary | ICD-10-CM | POA: Diagnosis not present

## 2018-02-12 DIAGNOSIS — I252 Old myocardial infarction: Secondary | ICD-10-CM

## 2018-02-12 DIAGNOSIS — I2510 Atherosclerotic heart disease of native coronary artery without angina pectoris: Secondary | ICD-10-CM | POA: Diagnosis not present

## 2018-02-12 MED ORDER — EVOLOCUMAB 140 MG/ML ~~LOC~~ SOAJ: 140.0000 mg | SUBCUTANEOUS | 0 refills | Status: DC

## 2018-02-12 NOTE — Telephone Encounter (Signed)
Patient rechallenged after 3 weeks because of rash on Praluent.  Took dose last night and again has rash down both arms.    Gave sample of Repatha 488 mg SureClick, he was instructed to wait 3 weeks again and do the injection in his abdomen.  He is to call after that and let us know if he has a skin reaction.

## 2018-02-12 NOTE — Patient Instructions (Signed)
Medication Instructions:  The current medical regimen is effective;  continue present plan and medications.  If you need a refill on your cardiac medications before your next appointment, please call your pharmacy.   Follow-Up: At [facility], you and your health needs are our priority.  As part of our continuing mission to provide you with exceptional heart care, we have created designated Provider Care Teams.  These Care Teams include your primary Cardiologist (physician) and Advanced Practice Providers (APPs -  Physician Assistants and Nurse Practitioners) who all work together to provide you with the care you need, when you need it. You will need a follow up appointment in 3 months.  Please call our office 2 months in advance to schedule this appointment.  You may see Dr.Kelly or one of the following Advanced Practice Providers on your designated Care Team: Almyra Deforest, [place] . Fabian Sharp, PA-C  Any Other Special Instructions Will Be Listed Below (If Applicable). We will order a new mask ResMed F30 Full face mask.

## 2018-02-12 NOTE — Progress Notes (Signed)
Patient ID: [name], male   DOB: [birth year], 68 y.o.   MRN: [id number]     HPI: [name] is a 68 y.o. male who is a former patient of Dr. Rollene Fare who presents for a 6 month follow-up cardiology/sleep evaluation.  [name] has CAD and suffered an inferior wall MI in 1994.  Cardiac catheterization revealed RCA occlusion with left-to-right collaterals. He was treated initially with PTCA of his RCA. His last cardiac catheterization was in 2009 which showed an RCA occlusion with collaterals with no significant LAD disease, although there was mild 20% diagonal stenosis.  An echo Doppler study done in August 2014 by Dr. Rollene Fare showed mild LVH with mild hypokinesis of the inferolateral wall but with preserved global systolic function with an EF of 55-60%. He had normal diastolic parameters.  In January 2015 he was admitted by the hospitalist service with chest pain. A stress Myoview study did not demonstrate convincing inducible myocardial ischemia. There was limited evaluation of the inferior wall due to movement and overlap of the diaphragm and activity within the left lobe of the liver. Ejection fraction was 47%. He was seen by Dr. Rayann Heman for cardiology consultation who recommended no further cardiac workup.  Lipid evaluation reveals a total cholesterol 163 triglycerides 100 HDL 31 LDL 112 in the past, he had some difficulty taking statins.  Additional problems include hypertension, obesity, and arthritis. He has a remote history of right buttock abscess leading to hospitalization in December 2013.  When I initially saw him, I discontinued his gemfibrozil and recommended a trial of Livalo to take and hopefully tolerate  along with coenzyme Q10.  I titrated his amlodipine to 5 mg daily for more optimal blood pressure control.  I was also very concerned about obstructive sleep apnea and referred him for a sleep study.  Due to my concerns for obstructive sleep apnea, he underwent a split-night  protocol, on 05/10/2012.  This revealed severe sleep apnea with an AHI of 75.6 per hour.  During REM sleep this was 40 per hour.  He had significant oxygen desaturation to 78% with non-REM sleep and 76% with REM sleep.  He also had a significant positional component to his sleep apnea.  He was started on CPAP titration but due to high-pressure he was was switched to a BiPAP mode.  Ultimately, a BiPAP auto unit was recommended with an EPAP minimum of 10 and a IPAP maximum up to 25.  He also did have periodic lid movements with sleep with an index of 18.  He apparently initially used BiPAP for only 3 weeks but was having difficulty with his mass.  However, before  being seen for f/u he returned his BiPAP unit due to concerns that he was not compliant and would have to pay for this without insurance.  His sleep was very poor, he was unable to sleep on his back, and snored loudly.  I referred him for a follow-up sleep study which was done on 08/10/2015 which confirmed severe sleep apnea with an AHI of 95.7.  He ultimately underwent CPAP and then BiPAP titration and was titrated up to 17/13.  A BiPAP auto therapy was recommended with an EPAP minimum of 10 and IPAP maximum of 25.  He received his new BiPAP machine on 09/05/2015 and is compliant until the very beginning of October.  I reviewed his ResMed compliance data, which documents compliance, both for usage stays as well as usage greater than 4 hours.  However, on  10/05/2017bhe underwent cervical neck surgery by Dr. Rita Ohara and when he was wearing his cervical collar was not able to use his CPAP. His DME company is [employer].  He was diagnosed with Crohn's disease following a colonoscopy.  He has had issues with anal fissures and fistula.  Since I last saw him, he underwent surgery for an anal fistula repair in January 2018.  He tolerated that well from a cardiovascular standpoint.  He denies any episodes of chest pain or palpitations.  He continues to  use CPAP but has been having difficulty with his full face mask.  I obtained a download in the office from 06/06/2016 through 07/05/2016.  That time, he was only using treatment for 3 hours and 33 minutes per night. He has an ResMed AirCurve 10 Auto BiPAP  unit with maximum average IPAP pressure at 17 EPAP pressure of 13.  He had significant mask leak, I recommended a trial of a new dream where fullface mask.  When you receive this, he had an incredible night sleep.  The first night.  However, after the first night he had difficulty with mask adjustment to his nose region.  As result, his only been intermittently using therapy.   He has been intolerant to Crestor, Lipitor, Zocor, Pravachol, and Livalo.  When checked by Dr. Gerarda Fraction in September 2017 LDL cholesterol was 161 with a total cholesterol 225.  He underwent left shoulder arthroscopy with subacromial decompression, rotator cuff repair and bicep tendon repair by Dr. Amada Jupiter on 11/30/2016.  On 12/31/2016 he presented to the emergency room with vertigo symptoms.  Apparently, a CT  was negative and there was some response to meclizine.  He was given information regarding Epley maneuvers to try at home.  When I  saw him in November 2018 he denied any chest pain, PND, orthopnea, or palpitations.  On Jun 30, 2017 he had undergone repeat laboratory which showed a total cholesterol of 214, HDL 33, LDL 147, triglycerides 109.  Trial of Zetia had been recommended but apparently he did not do this since he states in the past he did not derive any significant benefit.  He stopped using CPAP again concerns with his mask.    I last saw him in June 2019.  At that time he was not using his BiPAP due to difficulty with his mask and I had recommended a trial of a new ResMed air fit P 30 I DreamWear like mask.  Apparently, his wife had issues with her mask and he had given his new mask to her.  As result he has not had any mass.  He would recommend now however the full  facemask rather than just the nasal DreamWear style.  In addition, due to his statin intolerance I initiated evaluation for PCSK9 inhibition.  Apparently he did not get initial approval with Repatha but initiated Praluent.  He has been on the Praluent assistance program and has not had any self cost.  He took Praluent for 4 injections but shortly after each injection he be subsequently began to notice the development of a rash.  He stopped using this for 3 weeks and then restarted again on December 15 and noticed this slight rash on his arms on Christmas Day.  He took his last dose yesterday and again notices a rash in his arms. Repeat laboratory has shown improvement with his LDL cholesterol most recently at 58 down from 126.  He presents for evaluation.  Past Medical History:  Diagnosis  Date  . Anxiety   . Articular cartilage disorder involving shoulder region, left 11/2016  . Bursitis of shoulder, left 11/2016  . Complication of anesthesia    hx. of heart rate dropping during 2 surgeries   (2004- back surgery and with Lithotrispy )  . Crohn's disease ([place])   . Depression   . Dyspnea    with exertion  . History of hepatitis    unknown type - 1970s  . History of kidney stones   . Hyperlipidemia    unable to tolerat statins  . Hypertension    states under control with meds., has been on med. since 1994  . Immature cataract of both eyes   . Limited joint range of motion    cervical spine  . Myocardial infarct ([place]) 09/13/1992  . Nonobstructive atherosclerosis of coronary artery   . Numbness in both hands   . Obesity   . Osteoarthritis 11/2016   left shoulder  . Rotator cuff tear, left 11/2016  . Sleep apnea    uses BiPAP, but having issues with mask not sealing   . Thyroid nodule     Past Surgical History:  Procedure Laterality Date  . ANAL FISTULOTOMY N/A 06/28/2013   Procedure: ANAL FISTULOTOMY;  Surgeon: [name], MD;  Location: [facility];  Service: General;  Laterality:  N/A;  . ANAL FISTULOTOMY  2017  . ANTERIOR CERVICAL DECOMP/DISCECTOMY FUSION N/A 11/12/2015   Procedure: ANTERIOR CERVICAL DECOMPRESSION/DISCECTOMY FUSION CERVICAL THREE- CERVICAL FOUR, CERVICAL FOUR- CERVICAL FIVE;  Surgeon: Jovita Gamma, MD;  Location: [facility];  Service: Neurosurgery;  Laterality: N/A;  ANTERIOR CERVICAL DECOMPRESSION/DISCECTOMY FUSION C3-C4, C4-C5  . BIOPSY THYROID    . CARDIAC CATHETERIZATION  1994, 12/25/2007   a. PTCA alone in 1994 b. RCA CTO w/ L-R collaterals, 40-50% prox RCA, 20% prox LAD; EF 50-55%, inferoapical HK  . CARDIAC CATHETERIZATION  04/02/2001  . CARDIAC CATHETERIZATION  11/07/2000  . CARDIAC CATHETERIZATION  09/24/1996  . CARPAL TUNNEL RELEASE Left 10/09/2014   Procedure: LEFT CARPAL TUNNEL RELEASE;  Surgeon: [name], MD;  Location: [facility];  Service: Orthopedics;  Laterality: Left;  . CARPAL TUNNEL RELEASE Right 08/28/2003  . CAUTERY OF TURBINATES  11/05/2003  . COLONOSCOPY N/A 11/06/2014   RMR: Skipped Colonic ulcerations with significant ileocecal valve involvement and rectal sparing most consisitant with Crohns disease. Minimal non-steroidal drug use. Probable colonic lipoma  . CYSTECTOMY    . INCISION AND DRAINAGE ABSCESS Right 05/23/2012   Procedure: INCISION AND DRAINAGE ABSCESS;  Surgeon: [name], MD;  Location: [facility];  Service: General;  Laterality: Right;  . KNEE ARTHROSCOPY    . LITHOTRIPSY    . LUMBAR FUSION  02/18/2002  . LUMBAR LAMINECTOMY  02/18/2002   L4-5  . NASAL SEPTUM SURGERY  11/05/2003  . PARTIAL NEPHRECTOMY Left    age 21  . SHOULDER ARTHROSCOPY WITH SUBACROMIAL DECOMPRESSION, ROTATOR CUFF REPAIR AND BICEP TENDON REPAIR Left 11/30/2016   Procedure: LEFT SHOULDER ARTHROSCOPY, DEBRIDEMENT, ACROMIOPLASTY WITH DISTAL CLAVICAL EXCISION, POSSIBLE ROTATOR CUFF REPAIR AND BICEP TENODESIS;  Surgeon: Ninetta Lights, MD;  Location: [facility];  Service: Orthopedics;  Laterality: Left;  . SPLENECTOMY     age 43  .  TRANSTHORACIC ECHOCARDIOGRAM  03/05/2009   EF >55%, normal LV wall thickness.    Allergies  Allergen Reactions  . Niacin And Related Other (See Comments)    FLUSHING  . Penicillins Hives and Itching    Has patient had a PCN reaction  causing immediate rash, facial/tongue/throat swelling, SOB or lightheadedness with hypotension: Unknown Has patient had a PCN reaction causing severe rash involving mucus membranes or skin necrosis: Unknown Has patient had a PCN reaction that required hospitalization: Unknown Has patient had a PCN reaction occurring within the last 10 years: No If all of the above answers are "NO", then may proceed with Cephalosporin use.   . Statins Other (See Comments)    MYALGIAS    Current Outpatient Medications  Medication Sig Dispense Refill  . ALPRAZolam (XANAX) 0.5 MG tablet Take 1 tablet (0.5 mg total) by mouth 3 (three) times daily as needed (dizziness). 21 tablet 0  . amLODipine (NORVASC) 5 MG tablet Take 1 tablet (5 mg total) by mouth daily. 180 tablet 3  . carbamide peroxide (DEBROX) 6.5 % OTIC solution Place 5 drops into both ears 2 (two) times daily as needed (FOR EAR WAX REMOVAL).    Marland Kitchen metoprolol succinate (TOPROL-XL) 50 MG 24 hr tablet Take 50 mg by mouth daily. Take with or immediately following a meal.    . nitroGLYCERIN (NITROSTAT) 0.4 MG SL tablet Place 1 tablet (0.4 mg total) under the tongue every 5 (five) minutes as needed for chest pain. 25 tablet 3  . Probiotic Product (PROBIOTIC DAILY PO) Take 1 capsule by mouth daily. [place]    . Psyllium Husk POWD Take 12 g by mouth daily. INTO 12 OUNCES OF WATER (1 TABLESPOON)    . ramipril (ALTACE) 10 MG capsule Take 10 mg by mouth daily.    . Evolocumab (REPATHA SURECLICK) 536 MG/ML SOAJ Inject 140 mg into the skin every 14 (fourteen) days. 1 pen 0   No current facility-administered medications for this visit.     Social History   Socioeconomic History  . Marital status: Married    Spouse  name: Not on file  . Number of children: Not on file  . Years of education: Not on file  . Highest education level: Not on file  Occupational History  . Not on file  Social Needs  . Financial resource strain: Not on file  . Food insecurity:    Worry: Not on file    Inability: Not on file  . Transportation needs:    Medical: Not on file    Non-medical: Not on file  Tobacco Use  . Smoking status: Former Smoker    Packs/day: 0.00    Years: 18.00    Pack years: 0.00    Last attempt to quit: 02/06/1993    Years since quitting: 25.0  . Smokeless tobacco: Former Network engineer and Sexual Activity  . Alcohol use: No    Alcohol/week: 0.0 standard drinks  . Drug use: No  . Sexual activity: Yes  Lifestyle  . Physical activity:    Days per week: Not on file    Minutes per session: Not on file  . Stress: Not on file  Relationships  . Social connections:    Talks on phone: Not on file    Gets together: Not on file    Attends religious service: Not on file    Active member of club or organization: Not on file    Attends meetings of clubs or organizations: Not on file    Relationship status: Not on file  . Intimate partner violence:    Fear of current or ex partner: Not on file    Emotionally abused: Not on file    Physically abused: Not on file    Forced  sexual activity: Not on file  Other Topics Concern  . Not on file  Social History Narrative   Lives in [place], [name]. Married, 2 children.     Family History  Problem Relation Age of Onset  . CAD Father        CABG   . CAD Brother        CABG in 29s  . CAD Brother   . Arthritis/Rheumatoid Mother   . Hyperlipidemia Mother   . Thrombocytopenia Mother   . COPD Son        smoker  . Alzheimer's disease Maternal Grandmother   . Lung disease Paternal Grandfather    ROS General: Negative; No fevers, chills, or night sweats HEENT: Negative; No changes in vision or hearing, sinus congestion, difficulty  swallowing Pulmonary: Negative; No cough, wheezing, shortness of breath, hemoptysis Cardiovascular: Negative; No chest pain, presyncope, syncope, palpatations GI: Negative; No nausea, vomiting, diarrhea, or abdominal pain GU: Negative; No dysuria, hematuria, or difficulty voiding Musculoskeletal: Positive for cervical disc disease. Hematologic: Negative; no easy bruising, bleeding Endocrine: Negative; no heat/cold intolerance Neuro: Negative; no changes in balance, headaches Skin: Dermatologic rash following initiation of Praluent Psychiatric: Negative; No behavioral problems, depression Sleep: positive severe sleep apnea, Now having received another BiPAP unit  daytime sleepiness, hypersomnolence, and snoring, no bruxism, he admits to limb movement, but denies painful restless legs, no hypnogognic hallucinations, no cataplexy   PE BP 118/70   Pulse 70   Ht '5\' 9"'  (1.753 m)   Wt 222 lb 9.6 oz (101 kg)   BMI 32.87 kg/m   Repeat blood pressure was 120/72  Wt Readings from Last 3 Encounters:  02/12/18 222 lb 9.6 oz (101 kg)  07/17/17 224 lb (101.6 kg)  01/03/17 221 lb 9.6 oz (100.5 kg)   General: Alert, oriented, no distress.  Skin:  Mild macular papular rash on the volar aspect of his left arm HEENT: Normocephalic, atraumatic. Pupils equal round and reactive to light; sclera anicteric; extraocular muscles intact;  Nose without nasal septal hypertrophy Mouth/Parynx benign; Mallinpatti scale 3 Neck: No JVD, no carotid bruits; normal carotid upstroke Lungs: clear to ausculatation and percussion; no wheezing or rales Chest wall: Mild pectus excavatum chest deformity Heart: PMI not displaced, RRR, s1 s2 normal, 1/6 systolic murmur, no diastolic murmur, no rubs, gallops, thrills, or heaves Abdomen: soft, nontender; no hepatosplenomehaly, BS+; abdominal aorta nontender and not dilated by palpation. Back: no CVA tenderness Pulses 2+ Musculoskeletal: full range of motion, normal  strength, no joint deformities Extremities: no clubbing cyanosis or edema, Homan's sign negative  Neurologic: grossly nonfocal; Cranial nerves grossly wnl Psychologic: Normal mood and affect  ECG (independently read by me): Normal sinus rhythm at 70 bpm.  First-degree AV block.  Isolated PVC.  June 2019 ECG (independently read by me): Normal sinus rhythm at 64 bpm with first-degree AV block.  PR interval 210 ms.  No ectopy.  November 2018 ECG (independently read by me): Normal sinus rhythm at 65 bpm.  First-degree AV block with a PR interval of 212 ms.  No syncope.  No ST segment changes.  May 2018 ECG (independently read by me): Normal sinus rhythm at 65 bpm.  Normal intervals.  No ectopy.  Small nondiagnostic inferior Q waves   October 2017 ECG (independently read by me): Sinus rhythm with first-degree AV block.  Will intervals.  May 2017 ECG (independently read by me): Normal sinus rhythm at 73 bpm.  Small nondiagnostic inferior Q waves.  No ST segment  changes.  April 2016 ECG (independently read by me): Normal sinus rhythm at 68 bpm.  Nonspecific T changes.  Normal intervals.  April 2015 ECG (independently read by me): Normal sinus rhythm at 61 beats per minute.  Normal intervals  Prior 03/28/2013 ECG (independently read by me): Sinus rhythm at 66 beats per minute. No ectopy. Q-wave in lead 3   LABS:  BMP Latest Ref Rng & Units 03/16/2017 12/31/2016 12/31/2016  Glucose 65 - 99 mg/dL 92 104(H) 102(H)  BUN 8 - 27 mg/dL '10 9 10  ' Creatinine 0.76 - 1.27 mg/dL 0.85 0.70 0.68  BUN/Creat Ratio 10 - 24 12 - -  Sodium 134 - 144 mmol/L 143 143 139  Potassium 3.5 - 5.2 mmol/L 4.7 3.7 3.7  Chloride 96 - 106 mmol/L 105 103 104  CO2 20 - 29 mmol/L 23 - 26  Calcium 8.6 - 10.2 mg/dL 9.2 - 9.0    Hepatic Function Latest Ref Rng & Units 03/16/2017 12/31/2016 12/18/2015  Total Protein 6.0 - 8.5 g/dL 7.2 7.8 7.2  Albumin 3.6 - 4.8 g/dL 4.1 4.1 3.9  AST 0 - 40 IU/L '18 23 15  ' ALT 0 - 44 IU/L '19 23  15  ' Alk Phosphatase 39 - 117 IU/L 96 106 103  Total Bilirubin 0.0 - 1.2 mg/dL 0.3 0.6 0.5    CBC Latest Ref Rng & Units 12/31/2016 12/31/2016 11/30/2016  WBC 4.0 - 10.5 K/uL - 6.3 6.7  Hemoglobin 13.0 - 17.0 g/dL 16.3 16.0 15.5  Hematocrit 39.0 - 52.0 % 48.0 48.8 45.0  Platelets 150 - 400 K/uL - 382 367    Lab Results  Component Value Date   MCV 91.7 12/31/2016   MCV 89.6 11/30/2016   MCV 87.8 12/18/2015    Lab Results  Component Value Date   TSH 1.315 06/10/2014   No results found for: HGBA1C   Lipid Panel     Component Value Date/Time   CHOL 117 12/29/2017 0812   TRIG 125 12/29/2017 0812   HDL 34 (L) 12/29/2017 0812   CHOLHDL 5.5 (H) 03/16/2017 0857   CHOLHDL 6.0 06/10/2014 0811   VLDL 34 06/10/2014 0811   LDLCALC 58 12/29/2017 0812   BNP No results found for: PROBNP   RADIOLOGY: No results found.  IMPRESSION:  1. Essential hypertension   2. CAD in native artery   3. Hyperlipidemia with target LDL less than 70   4. OSA (obstructive sleep apnea)   5. Old MI (myocardial infarction)   6. Statin intolerance   7. Rash     ASSESSMENT AND PLAN: Mr. Tobby Fawcett is a 68 year-old gentleman who suffered an inferior wall myocardial infarction in 1994 due to total RCA occlusion with good left to right collaterals without significant contralateral disease. His last nuclear perfusion study did not demonstrate significant ischemia. However, ejection fraction was 47%. His echo Doppler study did show inferolateral wall motion abnormality.  He has been fairly stable without recurrent anginal symptoms.  Blood pressure today remains stable on his current regimen consisting of amlodipine 5 mg, Toprol-XL 50 mg and ramipril 10 mg daily.  He has been intolerant to all statins.  He recently had been approved with Praluent and there free drug program and after taking several injections with Praluent began to notice erythematous maculopapular type rash.  His rash went away after he did  not do an injection for 3 weeks but again has recurred.  He has been given the injections in his upper arms and has  noticed the rash in his arms and volar aspect of the distal arm.  He has not tried injections subcutaneously in his abdomen.  I brought [employer], Pharm.D. into the office today to show her the rash.  His last dose of Praluent was yesterday.  B have recommended that he withhold treatment for 3 weeks.  We have provided him with a sample of Repatha and he will have a test injection with Repatha in 3 weeks but will give the injection in his abdomen.  Hopefully he will be able to tolerate Repatha and will be able to place him in the similar program that he is in with Praluent for free drug care.  If he develops a rash with Repatha, he may be a candidate for been podalic acid but ultimately gets FDA approval hopefully in the next several months.  He also has significant sleep apnea but has not been using therapy since he has not had a mask.  When he did receive a mask he gave it to his wife who has used the mask to the point where it is subsequently been replaced with her own new mask which we had written a prescription for at her last office visit.  He prefers a full facemask with a side tubing.  I will give him a prescription for a ResMed F 30 DreamWear style fullface mask.  I recommended reinstitution of BiPAP  Therapy.  We will see him in 3 months for follow-up evaluation.  Time spent 25 minutes  Troy Sine, MD, [facility]  02/12/2018 9:24 AM

## 2018-02-16 ENCOUNTER — Telehealth: Payer: Self-pay | Admitting: *Deleted

## 2018-02-16 NOTE — Telephone Encounter (Signed)
Informed patient's wife that this order was done back in June. Spoke with Jiles Crocker @ [facility] and he tells me the patient was part of their "pap audit" and has now been cleared. He should be receiving a call from their supply team within the next 24/48 hours. I told her that if she hasn't heard from them by Tuesday 02/20/18 to let me know so I can follow up on it. She voiced understanding.

## 2018-02-16 NOTE — Telephone Encounter (Signed)
-----   Message from Caprice Beaver, LPN sent at 07/14/7032  9:15 AM EST ----- Regarding: FW: New Mask I do not believe he received it. Not when he seen Dr.Kelly on Monday. He spoke as tho he was using his old one, maybe we can call [facility] and check on that order?  ----- Message ----- From: Lauralee Evener, CMA Sent: 02/14/2018   8:22 AM EST To: Caprice Beaver, LPN Subject: RE: New Mask                                   This was done back in June. Did he not get it? The order was sent to [facility]. ----- Message ----- From: Caprice Beaver, LPN Sent: 0/04/5246   9:14 AM EST To: Lauralee Evener, CMA Subject: New Mask                                       Dr.Kelly would like to order a new mask for patient, ResMed F30 full face mask, please order. Thank you!

## 2018-03-13 ENCOUNTER — Telehealth: Payer: Self-pay

## 2018-03-13 NOTE — Telephone Encounter (Signed)
Called pt to let them know that they needed to fill out the pass form because they left parts blank. Pt informed me that they were given a smaple of repatha and just took it this morning and would like to give it a week in order to see if it will work and possibly will not need praluent

## 2018-03-20 DIAGNOSIS — M4712 Other spondylosis with myelopathy, cervical region: Secondary | ICD-10-CM | POA: Diagnosis not present

## 2018-03-20 DIAGNOSIS — G5603 Carpal tunnel syndrome, bilateral upper limbs: Secondary | ICD-10-CM | POA: Diagnosis not present

## 2018-03-20 DIAGNOSIS — G959 Disease of spinal cord, unspecified: Secondary | ICD-10-CM | POA: Diagnosis not present

## 2018-03-20 DIAGNOSIS — M5030 Other cervical disc degeneration, unspecified cervical region: Secondary | ICD-10-CM | POA: Diagnosis not present

## 2018-03-20 DIAGNOSIS — M5020 Other cervical disc displacement, unspecified cervical region: Secondary | ICD-10-CM | POA: Diagnosis not present

## 2018-03-20 DIAGNOSIS — Z981 Arthrodesis status: Secondary | ICD-10-CM | POA: Diagnosis not present

## 2018-03-28 DIAGNOSIS — M542 Cervicalgia: Secondary | ICD-10-CM | POA: Diagnosis not present

## 2018-03-28 DIAGNOSIS — M19012 Primary osteoarthritis, left shoulder: Secondary | ICD-10-CM | POA: Diagnosis not present

## 2018-04-12 DIAGNOSIS — M25512 Pain in left shoulder: Secondary | ICD-10-CM | POA: Diagnosis not present

## 2018-04-16 DIAGNOSIS — M25512 Pain in left shoulder: Secondary | ICD-10-CM | POA: Diagnosis not present

## 2018-05-16 DIAGNOSIS — F419 Anxiety disorder, unspecified: Secondary | ICD-10-CM | POA: Diagnosis not present

## 2018-05-17 ENCOUNTER — Telehealth: Payer: Self-pay

## 2018-05-17 NOTE — Telephone Encounter (Signed)
Called patient, LVM to call back regarding appointment- if no concerns okay to move to August.

## 2018-05-22 ENCOUNTER — Telehealth: Payer: Self-pay | Admitting: Cardiovascular Disease

## 2018-05-22 NOTE — Telephone Encounter (Addendum)
Smartphone/ consented to virtual/ my chart pending/ pre reg completed

## 2018-05-23 ENCOUNTER — Encounter: Payer: Self-pay | Admitting: Cardiovascular Disease

## 2018-05-23 ENCOUNTER — Telehealth (INDEPENDENT_AMBULATORY_CARE_PROVIDER_SITE_OTHER): Payer: Medicare Other | Admitting: Cardiovascular Disease

## 2018-05-23 VITALS — BP 132/80 | HR 76 | Ht 69.0 in | Wt 199.0 lb

## 2018-05-23 DIAGNOSIS — R21 Rash and other nonspecific skin eruption: Secondary | ICD-10-CM

## 2018-05-23 DIAGNOSIS — I10 Essential (primary) hypertension: Secondary | ICD-10-CM

## 2018-05-23 DIAGNOSIS — I2510 Atherosclerotic heart disease of native coronary artery without angina pectoris: Secondary | ICD-10-CM

## 2018-05-23 DIAGNOSIS — E785 Hyperlipidemia, unspecified: Secondary | ICD-10-CM

## 2018-05-23 DIAGNOSIS — Z789 Other specified health status: Secondary | ICD-10-CM

## 2018-05-23 DIAGNOSIS — G4733 Obstructive sleep apnea (adult) (pediatric): Secondary | ICD-10-CM

## 2018-05-23 NOTE — Patient Instructions (Signed)
Medication Instructions:  Possible start trial of Bempedoic Acid (Nexletol) Pharmacy will be in touch regarding this.   If you need a refill on your cardiac medications before your next appointment, please call your pharmacy.    Follow-Up: At [facility], you and your health needs are our priority.  As part of our continuing mission to provide you with exceptional heart care, we have created designated Provider Care Teams.  These Care Teams include your primary Cardiologist (physician) and Advanced Practice Providers (APPs -  Physician Assistants and Nurse Practitioners) who all work together to provide you with the care you need, when you need it. You will need a follow up appointment in 4 months. You may see Dr.Kelly or one of the following Advanced Practice Providers on your designated Care Team: Almyra Deforest, [place] . Fabian Sharp, PA-C

## 2018-05-23 NOTE — Progress Notes (Signed)
Virtual Visit via Telephone Note   This visit type was conducted due to national recommendations for restrictions regarding the COVID-19 Pandemic (e.g. social distancing) in an effort to limit this patient's exposure and mitigate transmission in our community.  Due to his co-morbid illnesses, this patient is at least at moderate risk for complications without adequate follow up.  This format is felt to be most appropriate for this patient at this time.  Numerous attempts  were made by me and the patient to initiate process for a video recording.  However, the patient had technical difficulties with obtaining the video image requiring transitioning to audio format only (telephone).  All issues noted in this document were discussed and addressed.  No physical exam could be performed with this format.  Please refer to the patient's chart for his  consent to telehealth for [facility].   Evaluation Performed:  Follow-up visit  Date:  05/23/2018   ID:  DAVAUGHN HILLYARD, DOB 07/03/1950, MRN [id number]  Patient Location: Home Provider Location: Home  PCP:  Redmond School, MD  Cardiologist:   Electrophysiologist:  None   Chief Complaint:    History of Present Illness:     BOBBI KOZAKIEWICZ is a 68 y.o. male with a history of CAD and suffered an inferior wall myocardial infarction secondary to RCA occlusion in 1994, hypertension, obesity, obstructive sleep apnea on BiPAP, Crohn's disease with history of anal fissures and fistula as well as statin intolerance for which he was started on PCSK9 inhibition with Praluent.  I last saw him in January 2020 at which time he had developed a rash secondary to Praluent and he was advised to withhold therapy for 3 weeks and then have a trial of Repatha.  During that evaluation, I also had Cyril Mourning [place],PharmD see him in the office to show her the rash.  He states that he tried using Repatha and on the initial injection, he again developed a very similar rash which  took several weeks to ultimately subside.  As result, he has not been on any lipid-lowering therapy.  Presently, he does not denies any recurrent chest pain or anginal type symptomatology.  He continues to use BiPAP but has had difficulty with a dry mouth.  He has a DreamWear full facemask which he states is significantly improved from his prior masks.  However, usually after 2 to 3 hours his mouth is very dry.  We discussed adjustment in humidification and his wife states that they have tried manipulating the humidification level as well.  He has continued to have some difficulty with left shoulder discomfort due to his rotator cuff tear.  Remotely, Dr. Amada Jupiter had done surgery, and he was planning to to have repeat surgery sometime in the future with Dr. Fredonia Highland prior to the [place] pandemic.  At times he notes some tremor in his hand.  He admits to being very nervous regarding the [place] pandemic.  As result he has lost 23 pounds.  He tries to cut his grass.  He basically walks in his house from room to room at least 4-5 times per day for some exercise.  He continues to have issues intermittently with his Crohn's disease.  He apparently was given a prescription for Xanax which helps his stomach when he takes this intermittently at bedtime to help him sleep as well.   The patient does not have symptoms concerning for COVID-19 infection (fever, chills, cough, or new shortness of breath).    Past  Medical History:  Diagnosis Date   Anxiety    Articular cartilage disorder involving shoulder region, left 11/2016   Bursitis of shoulder, left 76/2831   Complication of anesthesia    hx. of heart rate dropping during 2 surgeries   (2004- back surgery and with Lithotrispy )   Crohn's disease ([place])    Depression    Dyspnea    with exertion   History of hepatitis    unknown type - 1970s   History of kidney stones    Hyperlipidemia    unable to tolerat statins   Hypertension    states  under control with meds., has been on med. since 1994   Immature cataract of both eyes    Limited joint range of motion    cervical spine   Myocardial infarct ([place]) 09/13/1992   Nonobstructive atherosclerosis of coronary artery    Numbness in both hands    Obesity    Osteoarthritis 11/2016   left shoulder   Rotator cuff tear, left 11/2016   Sleep apnea    uses BiPAP, but having issues with mask not sealing    Thyroid nodule    Past Surgical History:  Procedure Laterality Date   ANAL FISTULOTOMY N/A 06/28/2013   Procedure: ANAL FISTULOTOMY;  Surgeon: [name], MD;  Location: [facility];  Service: General;  Laterality: N/A;   ANAL FISTULOTOMY  2017   ANTERIOR CERVICAL DECOMP/DISCECTOMY FUSION N/A 11/12/2015   Procedure: ANTERIOR CERVICAL DECOMPRESSION/DISCECTOMY FUSION CERVICAL THREE- CERVICAL FOUR, CERVICAL FOUR- CERVICAL FIVE;  Surgeon: Jovita Gamma, MD;  Location: [place];  Service: Neurosurgery;  Laterality: N/A;  ANTERIOR CERVICAL DECOMPRESSION/DISCECTOMY FUSION C3-C4, C4-C5   BIOPSY THYROID     CARDIAC CATHETERIZATION  1994, 12/25/2007   a. PTCA alone in 1994 b. RCA CTO w/ L-R collaterals, 40-50% prox RCA, 20% prox LAD; EF 50-55%, inferoapical HK   CARDIAC CATHETERIZATION  04/02/2001   CARDIAC CATHETERIZATION  11/07/2000   CARDIAC CATHETERIZATION  09/24/1996   CARPAL TUNNEL RELEASE Left 10/09/2014   Procedure: LEFT CARPAL TUNNEL RELEASE;  Surgeon: [name], MD;  Location: [place];  Service: Orthopedics;  Laterality: Left;   CARPAL TUNNEL RELEASE Right 08/28/2003   CAUTERY OF TURBINATES  11/05/2003   COLONOSCOPY N/A 11/06/2014   RMR: Skipped Colonic ulcerations with significant ileocecal valve involvement and rectal sparing most consisitant with Crohns disease. Minimal non-steroidal drug use. Probable colonic lipoma   CYSTECTOMY     INCISION AND DRAINAGE ABSCESS Right 05/23/2012   Procedure: INCISION AND DRAINAGE ABSCESS;  Surgeon: [name], MD;  Location: [facility];  Service: General;  Laterality: Right;   KNEE ARTHROSCOPY     LITHOTRIPSY     LUMBAR FUSION  02/18/2002   LUMBAR LAMINECTOMY  02/18/2002   L4-5   NASAL SEPTUM SURGERY  11/05/2003   PARTIAL NEPHRECTOMY Left    age 35   SHOULDER ARTHROSCOPY WITH SUBACROMIAL DECOMPRESSION, ROTATOR CUFF REPAIR AND BICEP TENDON REPAIR Left 11/30/2016   Procedure: LEFT SHOULDER ARTHROSCOPY, DEBRIDEMENT, ACROMIOPLASTY WITH DISTAL CLAVICAL EXCISION, POSSIBLE ROTATOR CUFF REPAIR AND BICEP TENODESIS;  Surgeon: Ninetta Lights, MD;  Location: [place];  Service: Orthopedics;  Laterality: Left;   SPLENECTOMY     age 72   TRANSTHORACIC ECHOCARDIOGRAM  03/05/2009   EF >55%, normal LV wall thickness.     Current Meds  Medication Sig   ALPRAZolam (XANAX) 0.5 MG tablet Take 1 tablet (0.5 mg total) by mouth 3 (three) times daily as needed (dizziness).  amLODipine (NORVASC) 5 MG tablet Take 1 tablet (5 mg total) by mouth daily.   carbamide peroxide (DEBROX) 6.5 % OTIC solution Place 5 drops into both ears 2 (two) times daily as needed (FOR EAR WAX REMOVAL).   Evolocumab (REPATHA SURECLICK) 308 MG/ML SOAJ Inject 140 mg into the skin every 14 (fourteen) days.   famotidine (PEPCID) 10 MG tablet Take 10-20 mg by mouth 2 (two) times daily.   metoprolol succinate (TOPROL-XL) 50 MG 24 hr tablet Take 50 mg by mouth daily. Take with or immediately following a meal.   nitroGLYCERIN (NITROSTAT) 0.4 MG SL tablet Place 1 tablet (0.4 mg total) under the tongue every 5 (five) minutes as needed for chest pain.   Probiotic Product (PROBIOTIC DAILY PO) Take 1 capsule by mouth daily. SUPREMA DOPHILUS   Psyllium Husk POWD Take 12 g by mouth daily. INTO 12 OUNCES OF WATER (1 TABLESPOON)   ramipril (ALTACE) 10 MG capsule Take 10 mg by mouth daily.   sertraline (ZOLOFT) 25 MG tablet Take 1 tablet by mouth every morning.     Allergies:   Niacin and related; Penicillins; and Statins   Social  History   Tobacco Use   Smoking status: Former Smoker    Packs/day: 0.00    Years: 18.00    Pack years: 0.00    Last attempt to quit: 02/06/1993    Years since quitting: 25.3   Smokeless tobacco: Former Systems developer  Substance Use Topics   Alcohol use: No    Alcohol/week: 0.0 standard drinks   Drug use: No     Family Hx: The patient's family history includes Alzheimer's disease in his maternal grandmother; Arthritis/Rheumatoid in his mother; CAD in his brother, brother, and father; COPD in his son; Hyperlipidemia in his mother; Lung disease in his paternal grandfather; Thrombocytopenia in his mother.  ROS:   Please see the history of present illness.    Positive for anxiety and stress doing this COVID period. No recent chest pain.  No palpitations. No wheezing.  Left rotator cuff tear. Weight loss No PND orthopnea. Mild shaking of his hands. OSA on BiPAP Crohn's disease All other systems reviewed and are negative.   Labs/Other Tests and Data Reviewed:    EKG:  An ECG dated 02/12/2018 was personally reviewed today and demonstrated:  Normal sinus rhythm at 70 bpm with first-degree AV block and an isolated PVC  Recent Labs: No results found for requested labs within last 8760 hours.   Recent Lipid Panel Lab Results  Component Value Date/Time   CHOL 117 12/29/2017 08:12 AM   TRIG 125 12/29/2017 08:12 AM   HDL 34 (L) 12/29/2017 08:12 AM   CHOLHDL 5.5 (H) 03/16/2017 08:57 AM   CHOLHDL 6.0 06/10/2014 08:11 AM   LDLCALC 58 12/29/2017 08:12 AM    Wt Readings from Last 3 Encounters:  05/23/18 199 lb (90.3 kg)  02/12/18 222 lb 9.6 oz (101 kg)  07/17/17 224 lb (101.6 kg)     Objective:    Vital Signs:  BP 132/80    Pulse 76    Ht 5\' 9"  (1.753 m)    Wt 199 lb (90.3 kg)    SpO2 96%    BMI 29.39 kg/m    Well nourished, well developed male in no acute distress. Breathing is normal, respirations are unlabored Previous rash secondary to PCSK9 inhibition has resolved No  wheezing. No chest discomfort to palpation He states his abdomen is soft and nontender. Discomfort in his left shoulder secondary  to rotator cuff tear No significant lower extremity swelling He admits at times his hand shakes. Anxiety during this COVID-19.   ASSESSMENT & PLAN:    1. Essential hypertension: Blood pressure today is controlled on amlodipine 5 mg, ramipril 10 mg, and metoprolol 50 mg.  He states his pulse is regular without irregularity.  He denies orthostatic symptoms 2. CAD: No anginal symptomatology on medical therapy; remote MI 1994 3. Hyperlipidemia with target LDL less than 70.  Unfortunately the patient developed statin intolerance. He subsequently was treated with PCSK9 inhibition with excellent initial benefit with an LDL being reduced to 58.  He  developed rash secondary to Praluent and when given a dose of Repatha the similar rash developed.  As result for the past several months he has not been on treatment.  I discussed with him at length benpedoic acid which was recently approved by the FDA.  Hopefully when we can return to the office this medicine will be available in a sample format so that we can give him a trial of this medication.  We discussed diet and exercise. 4. OSA on BiPAP: He now has a DreamWear full facemask which is significantly feels is much more comfortable than the previous full facemask.  He is having issues with humidification and mouth dryness.  Oftentimes he has been taking it off after several hours.  I discussed with him the importance of continued use throughout the night.  We discussed adjusting humidification and perhaps now that the winter months are over in the mouth will not be as dry.  We discussed the ramifications of not using CPAP through the duration of the night particularly since the preponderance of REM sleep occurs in the second half of the night. 5. Anxiety: He was recently prescribed Xanax with benefit.  COVID-19 Education: The  signs and symptoms of COVID-19 were discussed with the patient and how to seek care for testing (follow up with PCP or arrange E-visit).  The importance of social distancing was discussed today.  Time:   Today, I have spent 30 minutes with the patient with telehealth technology discussing the above problems.     Medication Adjustments/Labs and Tests Ordered: Current medicines are reviewed at length with the patient today.  Concerns regarding medicines are outlined above.   Tests Ordered: No orders of the defined types were placed in this encounter.   Medication Changes: No orders of the defined types were placed in this encounter.   Disposition:  Follow up 4  months  Signed, Shelva Majestic, MD  05/23/2018 11:09 AM    [place]

## 2018-05-29 IMAGING — MR MR PELVIS WO/W CM
7 of 8 series · 43 of 48 positions shown · IV contrast (multihance)
Comparison: None.

CLINICAL DATA: History of Crohn's disease. Rectal bleeding.
Evaluate for fistula.

EXAM:
MRI PELVIS WITHOUT AND WITH CONTRAST
TECHNIQUE: Multiplanar multisequence MR imaging of the pelvis was performed
both before and after administration of intravenous contrast.
CONTRAST:  20 cc of MultiHance

[Series 3: t2_tse_sag · sagittal · 2.5mm · 0.81mm/px · 7 of 50 slices shown]
[im 1/50]
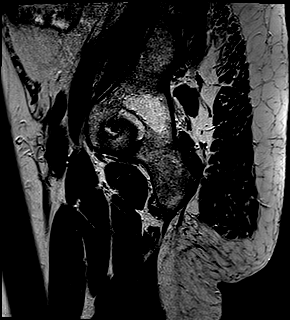
[im 9/50]
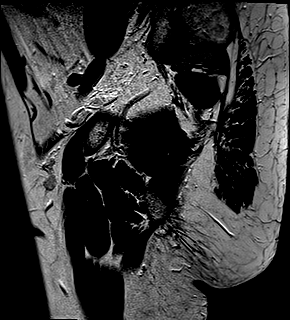
[im 17/50]
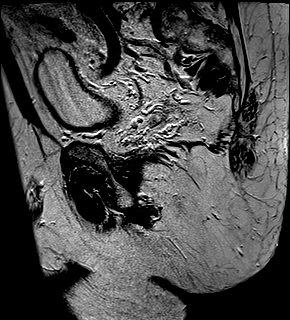
[im 25/50]
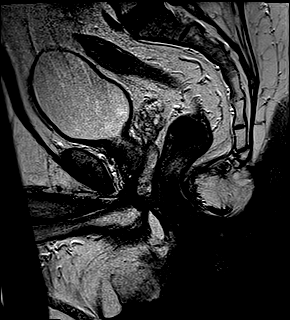
[im 33/50]
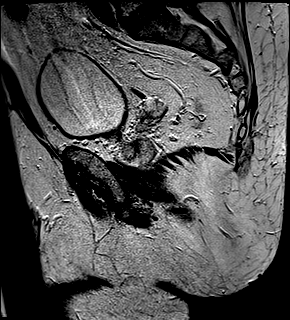
[im 41/50]
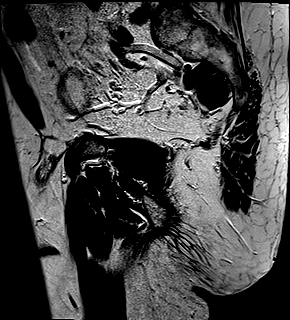
[im 50/50]
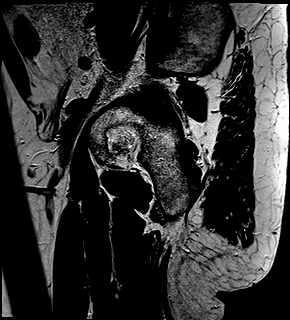

[Series 4: t2_tse_sag fs · sagittal · 2.5mm · 0.81mm/px · 7 of 50 slices shown]
[im 1/50]
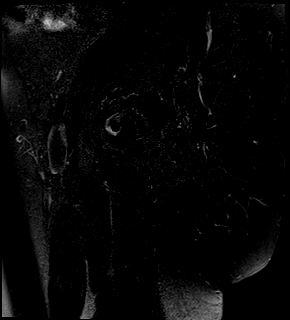
[im 9/50]
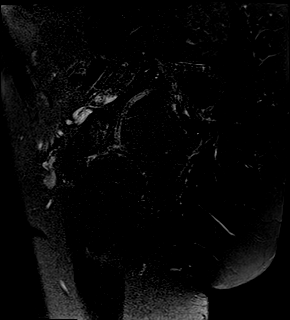
[im 17/50]
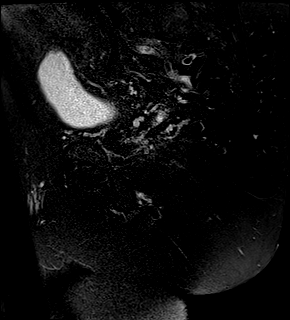
[im 25/50]
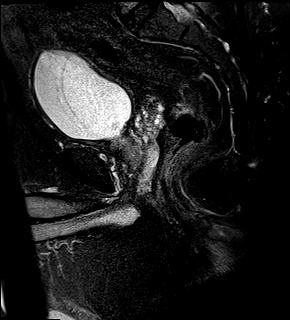
[im 33/50]
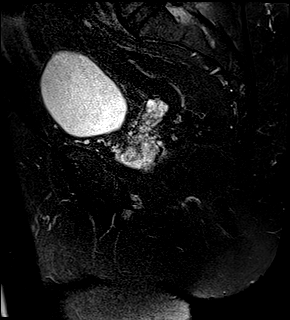
[im 41/50]
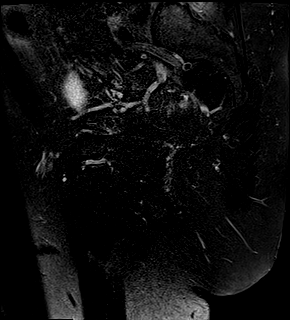
[im 50/50]
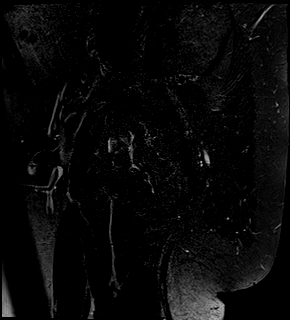

[Series 5: t1_tse axial obl · axial · 4.0mm · 0.57mm/px · z∈[-146,-1]mm · 6 of 36 slices shown]
[im 1/36]
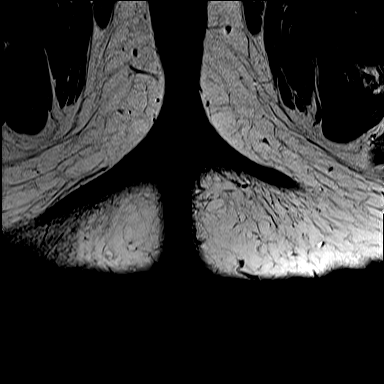
[im 8/36]
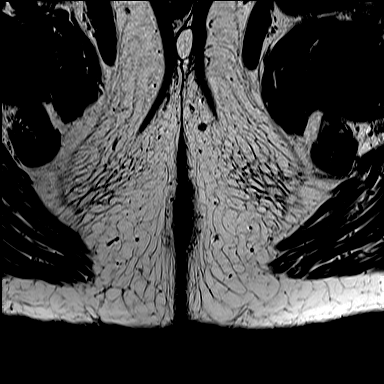
[im 15/36]
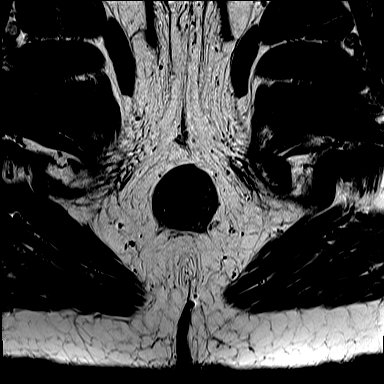
[im 22/36]
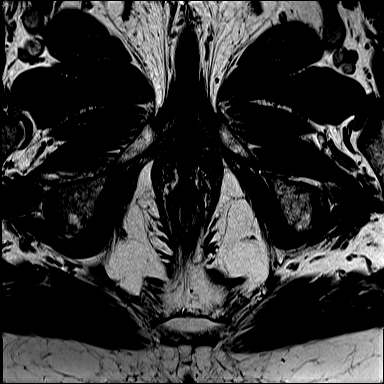
[im 29/36]
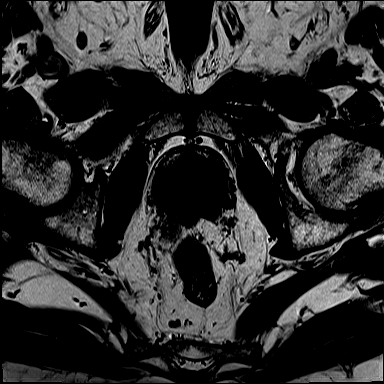
[im 36/36]
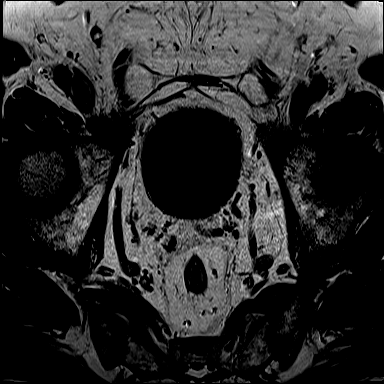

[Series 6: t2_tse axial obl · axial · 4.0mm · 0.86mm/px · z∈[-146,-1]mm · 6 of 36 slices shown (1 of 2)]
[im 1/36]
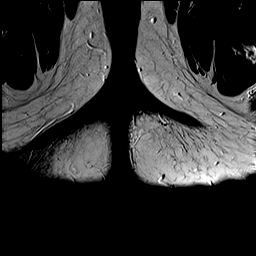
[im 8/36]
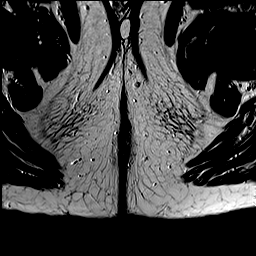
[im 15/36]
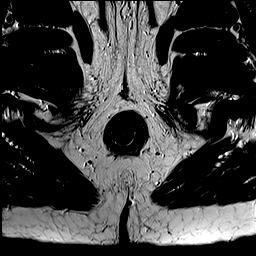
[im 22/36]
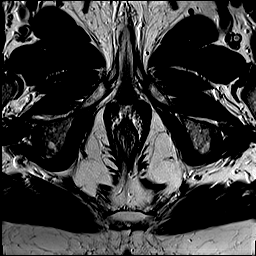
[im 29/36]
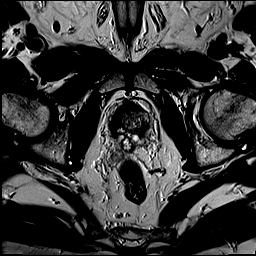
[im 36/36]
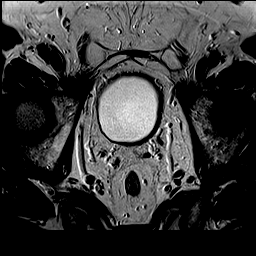

[Series 7: t2_tse axial obl · axial · 4.0mm · 0.70mm/px · z∈[-147,-2]mm · 6 of 36 slices shown (2 of 2)]
[im 1/36]
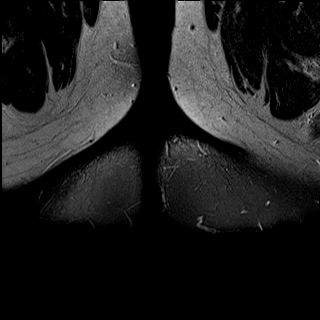
[im 8/36]
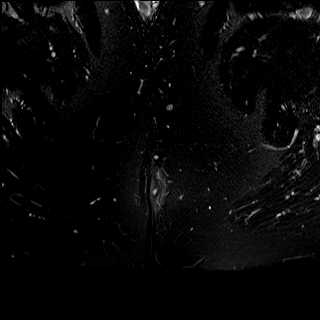
[im 15/36]
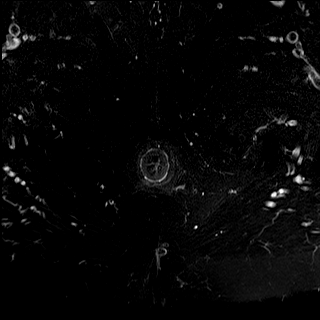
[im 22/36]
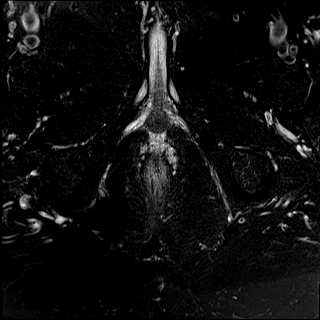
[im 29/36]
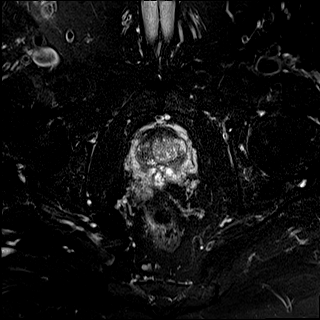
[im 36/36]
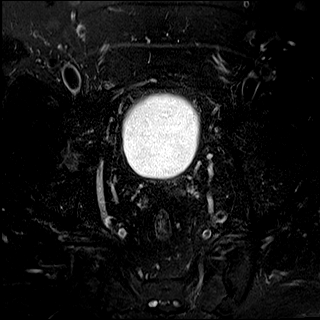

[Series 8: T2 fat-sat · coronal · 4.0mm · 0.75mm/px · 5 of 29 slices shown]
[im 1/29]
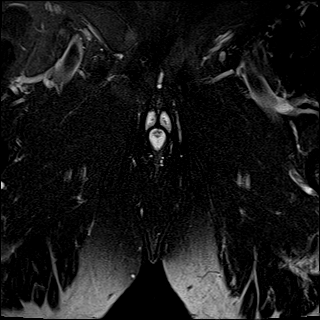
[im 8/29]
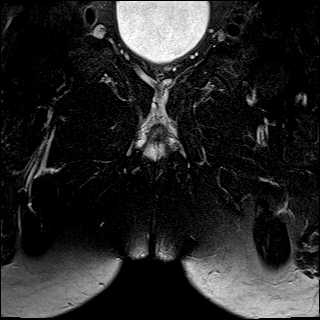
[im 15/29]
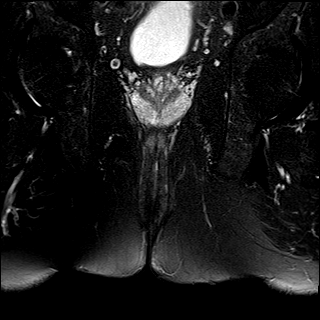
[im 22/29]
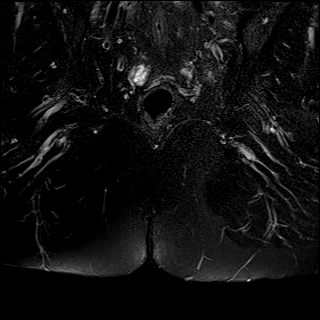
[im 29/29]
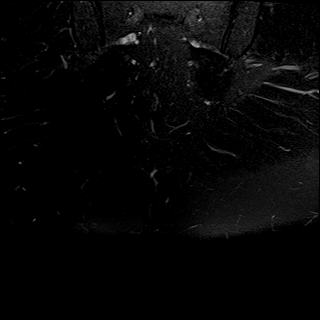

[Series 9: post t1_tse axial · axial · 4.0mm · 0.57mm/px · z∈[-146,-1]mm · 6 of 36 slices shown]
[im 1/36]
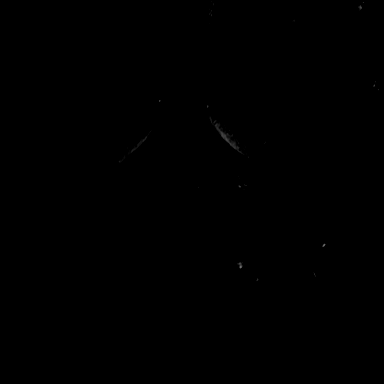
[im 8/36]
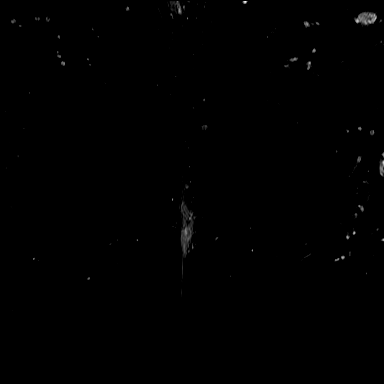
[im 15/36]
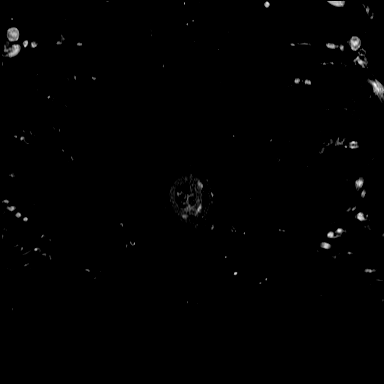
[im 22/36]
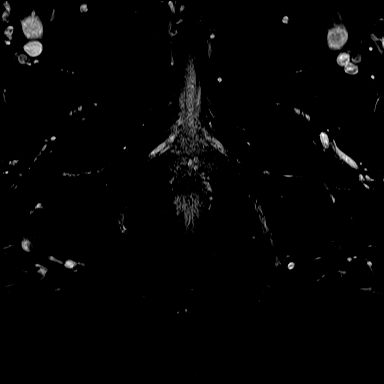
[im 29/36]
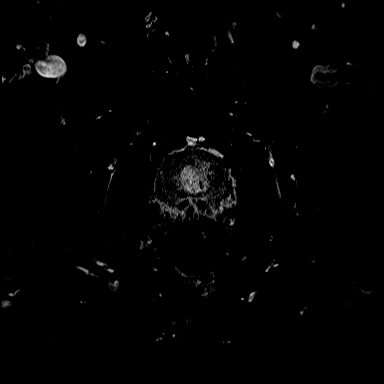
[im 36/36]
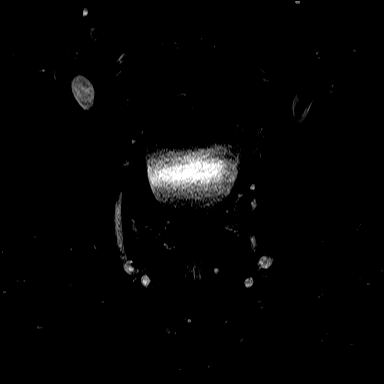

[43 of 48 positions shown; findings below may reference images not displayed]

FINDINGS: Urinary Tract:  No abnormality visualized.

Bowel:  Unremarkable visualized pelvic bowel loops.

Vascular/Lymphatic: No pathologically enlarged lymph nodes. No
significant vascular abnormality seen.

Reproductive:  No mass or other significant abnormality

Other: Edema along the left side of the anal sphincter is
identified, image number 23 of series 9. There is a posterior
lateral intersphincteric perianal fistula on the left, image number
24 of series 9. A second grade 1 perianal fistula arises on the left
within the anterior lateral position, images 25 through 29 of series
7. No abscess identified.

Musculoskeletal: No suspicious bone lesions identified.
IMPRESSION: 1. Examination is positive for two left sided grade 1
intersphincteric fistulas within the anterior lateral and posterior
lateral positions.
2. No abscess identified.

## 2018-07-23 DIAGNOSIS — R6889 Other general symptoms and signs: Secondary | ICD-10-CM | POA: Diagnosis not present

## 2018-07-23 DIAGNOSIS — Z1389 Encounter for screening for other disorder: Secondary | ICD-10-CM | POA: Diagnosis not present

## 2018-07-23 DIAGNOSIS — R682 Dry mouth, unspecified: Secondary | ICD-10-CM | POA: Diagnosis not present

## 2018-07-23 DIAGNOSIS — F419 Anxiety disorder, unspecified: Secondary | ICD-10-CM | POA: Diagnosis not present

## 2018-07-23 DIAGNOSIS — Z6831 Body mass index (BMI) 31.0-31.9, adult: Secondary | ICD-10-CM | POA: Diagnosis not present

## 2018-08-17 DIAGNOSIS — M25511 Pain in right shoulder: Secondary | ICD-10-CM | POA: Diagnosis not present

## 2018-08-22 DIAGNOSIS — M25511 Pain in right shoulder: Secondary | ICD-10-CM | POA: Diagnosis not present

## 2018-09-10 DIAGNOSIS — I2510 Atherosclerotic heart disease of native coronary artery without angina pectoris: Secondary | ICD-10-CM | POA: Diagnosis not present

## 2018-09-10 DIAGNOSIS — E748 Other specified disorders of carbohydrate metabolism: Secondary | ICD-10-CM | POA: Diagnosis not present

## 2018-09-10 DIAGNOSIS — Z0001 Encounter for general adult medical examination with abnormal findings: Secondary | ICD-10-CM | POA: Diagnosis not present

## 2018-09-10 DIAGNOSIS — I10 Essential (primary) hypertension: Secondary | ICD-10-CM | POA: Diagnosis not present

## 2018-09-10 DIAGNOSIS — Z125 Encounter for screening for malignant neoplasm of prostate: Secondary | ICD-10-CM | POA: Diagnosis not present

## 2018-09-10 DIAGNOSIS — Z6831 Body mass index (BMI) 31.0-31.9, adult: Secondary | ICD-10-CM | POA: Diagnosis not present

## 2018-09-10 DIAGNOSIS — Z1389 Encounter for screening for other disorder: Secondary | ICD-10-CM | POA: Diagnosis not present

## 2018-09-10 DIAGNOSIS — E6609 Other obesity due to excess calories: Secondary | ICD-10-CM | POA: Diagnosis not present

## 2018-09-10 DIAGNOSIS — G4730 Sleep apnea, unspecified: Secondary | ICD-10-CM | POA: Diagnosis not present

## 2018-09-24 DIAGNOSIS — M25512 Pain in left shoulder: Secondary | ICD-10-CM | POA: Diagnosis not present

## 2018-09-24 DIAGNOSIS — M25511 Pain in right shoulder: Secondary | ICD-10-CM | POA: Diagnosis not present

## 2018-10-03 DIAGNOSIS — M4802 Spinal stenosis, cervical region: Secondary | ICD-10-CM | POA: Diagnosis not present

## 2018-10-03 DIAGNOSIS — M5412 Radiculopathy, cervical region: Secondary | ICD-10-CM | POA: Diagnosis not present

## 2018-10-03 DIAGNOSIS — M542 Cervicalgia: Secondary | ICD-10-CM | POA: Diagnosis not present

## 2018-10-05 DIAGNOSIS — M542 Cervicalgia: Secondary | ICD-10-CM | POA: Diagnosis not present

## 2018-10-05 DIAGNOSIS — M4802 Spinal stenosis, cervical region: Secondary | ICD-10-CM | POA: Diagnosis not present

## 2018-10-05 DIAGNOSIS — M5412 Radiculopathy, cervical region: Secondary | ICD-10-CM | POA: Diagnosis not present

## 2018-10-30 DIAGNOSIS — M542 Cervicalgia: Secondary | ICD-10-CM | POA: Diagnosis not present

## 2018-10-30 DIAGNOSIS — M5412 Radiculopathy, cervical region: Secondary | ICD-10-CM | POA: Diagnosis not present

## 2018-10-30 DIAGNOSIS — M4802 Spinal stenosis, cervical region: Secondary | ICD-10-CM | POA: Diagnosis not present

## 2018-11-16 DIAGNOSIS — H16223 Keratoconjunctivitis sicca, not specified as Sjogren's, bilateral: Secondary | ICD-10-CM | POA: Diagnosis not present

## 2018-11-16 DIAGNOSIS — Z23 Encounter for immunization: Secondary | ICD-10-CM | POA: Diagnosis not present

## 2019-03-21 DIAGNOSIS — G4700 Insomnia, unspecified: Secondary | ICD-10-CM | POA: Diagnosis not present

## 2019-03-21 DIAGNOSIS — M1991 Primary osteoarthritis, unspecified site: Secondary | ICD-10-CM | POA: Diagnosis not present

## 2019-03-21 DIAGNOSIS — I10 Essential (primary) hypertension: Secondary | ICD-10-CM | POA: Diagnosis not present

## 2019-04-07 DIAGNOSIS — I10 Essential (primary) hypertension: Secondary | ICD-10-CM | POA: Diagnosis not present

## 2019-04-07 DIAGNOSIS — E7849 Other hyperlipidemia: Secondary | ICD-10-CM | POA: Diagnosis not present

## 2019-04-10 DIAGNOSIS — G4733 Obstructive sleep apnea (adult) (pediatric): Secondary | ICD-10-CM | POA: Diagnosis not present

## 2019-05-28 DIAGNOSIS — H6090 Unspecified otitis externa, unspecified ear: Secondary | ICD-10-CM | POA: Diagnosis not present

## 2019-07-04 ENCOUNTER — Other Ambulatory Visit: Payer: Self-pay | Admitting: Internal Medicine

## 2019-07-04 ENCOUNTER — Other Ambulatory Visit (HOSPITAL_COMMUNITY): Payer: Self-pay | Admitting: Internal Medicine

## 2019-07-04 DIAGNOSIS — I10 Essential (primary) hypertension: Secondary | ICD-10-CM | POA: Diagnosis not present

## 2019-07-04 DIAGNOSIS — E7849 Other hyperlipidemia: Secondary | ICD-10-CM | POA: Diagnosis not present

## 2019-07-04 DIAGNOSIS — K50111 Crohn's disease of large intestine with rectal bleeding: Secondary | ICD-10-CM | POA: Diagnosis not present

## 2019-07-04 DIAGNOSIS — R5383 Other fatigue: Secondary | ICD-10-CM | POA: Diagnosis not present

## 2019-07-04 DIAGNOSIS — Z79899 Other long term (current) drug therapy: Secondary | ICD-10-CM | POA: Diagnosis not present

## 2019-07-04 DIAGNOSIS — N50812 Left testicular pain: Secondary | ICD-10-CM

## 2019-07-08 DIAGNOSIS — I10 Essential (primary) hypertension: Secondary | ICD-10-CM | POA: Diagnosis not present

## 2019-07-08 DIAGNOSIS — E7849 Other hyperlipidemia: Secondary | ICD-10-CM | POA: Diagnosis not present

## 2019-07-16 ENCOUNTER — Ambulatory Visit (HOSPITAL_COMMUNITY)
Admission: RE | Admit: 2019-07-16 | Discharge: 2019-07-16 | Disposition: A | Discharge status: Home / Self Care | Payer: Medicare Other | Source: Ambulatory Visit | Attending: Internal Medicine | Admitting: Internal Medicine

## 2019-07-16 ENCOUNTER — Other Ambulatory Visit: Payer: Self-pay

## 2019-07-16 DIAGNOSIS — N50812 Left testicular pain: Secondary | ICD-10-CM | POA: Diagnosis present

## 2019-07-17 DIAGNOSIS — M65342 Trigger finger, left ring finger: Secondary | ICD-10-CM | POA: Diagnosis not present

## 2019-07-17 DIAGNOSIS — R200 Anesthesia of skin: Secondary | ICD-10-CM | POA: Diagnosis not present

## 2019-07-17 DIAGNOSIS — M1811 Unilateral primary osteoarthritis of first carpometacarpal joint, right hand: Secondary | ICD-10-CM | POA: Diagnosis not present

## 2019-07-17 DIAGNOSIS — M79642 Pain in left hand: Secondary | ICD-10-CM | POA: Diagnosis not present

## 2019-07-17 DIAGNOSIS — M65341 Trigger finger, right ring finger: Secondary | ICD-10-CM | POA: Diagnosis not present

## 2019-07-17 DIAGNOSIS — R202 Paresthesia of skin: Secondary | ICD-10-CM | POA: Insufficient documentation

## 2019-07-17 DIAGNOSIS — M79641 Pain in right hand: Secondary | ICD-10-CM | POA: Diagnosis not present

## 2019-07-26 DIAGNOSIS — I10 Essential (primary) hypertension: Secondary | ICD-10-CM | POA: Diagnosis not present

## 2019-07-26 DIAGNOSIS — T7840XA Allergy, unspecified, initial encounter: Secondary | ICD-10-CM | POA: Diagnosis not present

## 2019-07-26 DIAGNOSIS — Z0001 Encounter for general adult medical examination with abnormal findings: Secondary | ICD-10-CM | POA: Diagnosis not present

## 2019-07-26 DIAGNOSIS — J302 Other seasonal allergic rhinitis: Secondary | ICD-10-CM | POA: Diagnosis not present

## 2019-07-26 DIAGNOSIS — G5602 Carpal tunnel syndrome, left upper limb: Secondary | ICD-10-CM | POA: Diagnosis not present

## 2019-08-07 DIAGNOSIS — E7849 Other hyperlipidemia: Secondary | ICD-10-CM | POA: Diagnosis not present

## 2019-08-07 DIAGNOSIS — I10 Essential (primary) hypertension: Secondary | ICD-10-CM | POA: Diagnosis not present

## 2019-08-19 DIAGNOSIS — L255 Unspecified contact dermatitis due to plants, except food: Secondary | ICD-10-CM | POA: Diagnosis not present

## 2019-09-04 DIAGNOSIS — G5603 Carpal tunnel syndrome, bilateral upper limbs: Secondary | ICD-10-CM | POA: Diagnosis not present

## 2019-09-04 DIAGNOSIS — M180 Bilateral primary osteoarthritis of first carpometacarpal joints: Secondary | ICD-10-CM | POA: Diagnosis not present

## 2019-09-04 DIAGNOSIS — M5412 Radiculopathy, cervical region: Secondary | ICD-10-CM | POA: Diagnosis not present

## 2019-09-30 ENCOUNTER — Ambulatory Visit: Payer: Medicare Other | Admitting: Cardiovascular Disease

## 2019-10-08 DIAGNOSIS — I10 Essential (primary) hypertension: Secondary | ICD-10-CM | POA: Diagnosis not present

## 2019-10-08 DIAGNOSIS — E7849 Other hyperlipidemia: Secondary | ICD-10-CM | POA: Diagnosis not present

## 2019-11-07 DIAGNOSIS — E7849 Other hyperlipidemia: Secondary | ICD-10-CM | POA: Diagnosis not present

## 2019-11-07 DIAGNOSIS — I10 Essential (primary) hypertension: Secondary | ICD-10-CM | POA: Diagnosis not present

## 2019-11-12 ENCOUNTER — Emergency Department (HOSPITAL_COMMUNITY): Payer: Medicare Other

## 2019-11-12 ENCOUNTER — Observation Stay (HOSPITAL_COMMUNITY)
Admission: EM | Admit: 2019-11-12 | Discharge: 2019-11-13 | Disposition: A | Discharge status: Home / Self Care | Payer: Medicare Other | Attending: Family Medicine | Admitting: Family Medicine

## 2019-11-12 ENCOUNTER — Other Ambulatory Visit: Payer: Self-pay

## 2019-11-12 ENCOUNTER — Encounter (HOSPITAL_COMMUNITY): Payer: Self-pay

## 2019-11-12 DIAGNOSIS — R404 Transient alteration of awareness: Secondary | ICD-10-CM | POA: Diagnosis not present

## 2019-11-12 DIAGNOSIS — R0689 Other abnormalities of breathing: Secondary | ICD-10-CM | POA: Diagnosis not present

## 2019-11-12 DIAGNOSIS — N179 Acute kidney failure, unspecified: Secondary | ICD-10-CM | POA: Diagnosis not present

## 2019-11-12 DIAGNOSIS — Z87891 Personal history of nicotine dependence: Secondary | ICD-10-CM | POA: Insufficient documentation

## 2019-11-12 DIAGNOSIS — R197 Diarrhea, unspecified: Secondary | ICD-10-CM | POA: Diagnosis present

## 2019-11-12 DIAGNOSIS — I2510 Atherosclerotic heart disease of native coronary artery without angina pectoris: Secondary | ICD-10-CM | POA: Diagnosis present

## 2019-11-12 DIAGNOSIS — Z743 Need for continuous supervision: Secondary | ICD-10-CM | POA: Diagnosis not present

## 2019-11-12 DIAGNOSIS — J1282 Pneumonia due to coronavirus disease 2019: Secondary | ICD-10-CM | POA: Diagnosis not present

## 2019-11-12 DIAGNOSIS — I10 Essential (primary) hypertension: Secondary | ICD-10-CM | POA: Diagnosis not present

## 2019-11-12 DIAGNOSIS — E860 Dehydration: Secondary | ICD-10-CM | POA: Diagnosis not present

## 2019-11-12 DIAGNOSIS — K5090 Crohn's disease, unspecified, without complications: Secondary | ICD-10-CM | POA: Diagnosis present

## 2019-11-12 DIAGNOSIS — U071 COVID-19: Secondary | ICD-10-CM | POA: Insufficient documentation

## 2019-11-12 DIAGNOSIS — R52 Pain, unspecified: Secondary | ICD-10-CM | POA: Diagnosis not present

## 2019-11-12 DIAGNOSIS — Z23 Encounter for immunization: Secondary | ICD-10-CM | POA: Diagnosis not present

## 2019-11-12 DIAGNOSIS — R059 Cough, unspecified: Secondary | ICD-10-CM | POA: Diagnosis not present

## 2019-11-12 DIAGNOSIS — R0602 Shortness of breath: Secondary | ICD-10-CM | POA: Diagnosis not present

## 2019-11-12 LAB — FIBRINOGEN: Fibrinogen: 519 mg/dL — ABNORMAL HIGH (ref 210–475)

## 2019-11-12 LAB — COMPREHENSIVE METABOLIC PANEL
ALT: 39 U/L (ref 0–44)
AST: 43 U/L — ABNORMAL HIGH (ref 15–41)
Albumin: 3.6 g/dL (ref 3.5–5.0)
Alkaline Phosphatase: 63 U/L (ref 38–126)
Anion gap: 11 (ref 5–15)
BUN: 20 mg/dL (ref 8–23)
CO2: 17 mmol/L — ABNORMAL LOW (ref 22–32)
Calcium: 8.1 mg/dL — ABNORMAL LOW (ref 8.9–10.3)
Chloride: 100 mmol/L (ref 98–111)
Creatinine, Ser: 1.28 mg/dL — ABNORMAL HIGH (ref 0.61–1.24)
GFR calc non Af Amer: 57 mL/min — ABNORMAL LOW (ref >60)
Glucose, Bld: 92 mg/dL (ref 70–99)
Potassium: 3.6 mmol/L (ref 3.5–5.1)
Sodium: 128 mmol/L — ABNORMAL LOW (ref 135–145)
Total Bilirubin: 0.8 mg/dL (ref 0.3–1.2)
Total Protein: 6.9 g/dL (ref 6.5–8.1)

## 2019-11-12 LAB — CBC WITH DIFFERENTIAL/PLATELET
Abs Immature Granulocytes: 0.01 10*3/uL (ref 0.00–0.07)
Basophils Absolute: 0 10*3/uL (ref 0.0–0.1)
Basophils Relative: 0 %
Eosinophils Absolute: 0 10*3/uL (ref 0.0–0.5)
Eosinophils Relative: 0 %
HCT: 44.4 % (ref 39.0–52.0)
Hemoglobin: 15.6 g/dL (ref 13.0–17.0)
Immature Granulocytes: 0 %
Lymphocytes Relative: 26 %
Lymphs Abs: 1.2 10*3/uL (ref 0.7–4.0)
MCH: 30.8 pg (ref 26.0–34.0)
MCHC: 35.1 g/dL (ref 30.0–36.0)
MCV: 87.7 fL (ref 80.0–100.0)
Monocytes Absolute: 0.6 10*3/uL (ref 0.1–1.0)
Monocytes Relative: 14 %
Neutro Abs: 2.7 10*3/uL (ref 1.7–7.7)
Neutrophils Relative %: 60 %
Platelets: 254 10*3/uL (ref 150–400)
RBC: 5.06 MIL/uL (ref 4.22–5.81)
RDW: 13.8 % (ref 11.5–15.5)
WBC: 4.6 10*3/uL (ref 4.0–10.5)
nRBC: 0 % (ref 0.0–0.2)

## 2019-11-12 LAB — RESPIRATORY PANEL BY RT PCR (FLU A&B, COVID)
Influenza A by PCR: NEGATIVE
Influenza B by PCR: NEGATIVE
SARS Coronavirus 2 by RT PCR: POSITIVE — AB

## 2019-11-12 LAB — FERRITIN: Ferritin: 508 ng/mL — ABNORMAL HIGH (ref 24–336)

## 2019-11-12 LAB — LACTIC ACID, PLASMA: Lactic Acid, Venous: 1.1 mmol/L (ref 0.5–1.9)

## 2019-11-12 LAB — TRIGLYCERIDES: Triglycerides: 63 mg/dL (ref <150)

## 2019-11-12 LAB — D-DIMER, QUANTITATIVE: D-Dimer, Quant: 0.67 ug/mL-FEU — ABNORMAL HIGH (ref 0.00–0.50)

## 2019-11-12 LAB — HIV ANTIBODY (ROUTINE TESTING W REFLEX): HIV Screen 4th Generation wRfx: NONREACTIVE

## 2019-11-12 LAB — PROCALCITONIN: Procalcitonin: <0.1 ng/mL

## 2019-11-12 LAB — C-REACTIVE PROTEIN: CRP: 1.7 mg/dL — ABNORMAL HIGH (ref <1.0)

## 2019-11-12 LAB — LACTATE DEHYDROGENASE: LDH: 182 U/L (ref 98–192)

## 2019-11-12 MED ORDER — ALBUTEROL SULFATE HFA 108 (90 BASE) MCG/ACT IN AERS: 2.0000 | INHALATION_SPRAY | Freq: Once | RESPIRATORY_TRACT | Status: DC | PRN

## 2019-11-12 MED ORDER — ASCORBIC ACID 500 MG PO TABS
500.0000 mg | ORAL_TABLET | Freq: Every day | ORAL | Status: DC
  Administered 2019-11-13: 500 mg via ORAL
  Filled 2019-11-12: qty 1

## 2019-11-12 MED ORDER — ONDANSETRON HCL 4 MG/2ML IJ SOLN: 4.0000 mg | Freq: Four times a day (QID) | INTRAMUSCULAR | Status: DC | PRN

## 2019-11-12 MED ORDER — SODIUM CHLORIDE 0.9 % IV SOLN: INTRAVENOUS | Status: DC | PRN

## 2019-11-12 MED ORDER — ENOXAPARIN SODIUM 40 MG/0.4ML ~~LOC~~ SOLN
40.0000 mg | SUBCUTANEOUS | Status: DC
  Filled 2019-11-12: qty 0.4

## 2019-11-12 MED ORDER — POTASSIUM CHLORIDE IN NACL 40-0.9 MEQ/L-% IV SOLN
INTRAVENOUS | Status: DC
  Filled 2019-11-12 (×2): qty 1000

## 2019-11-12 MED ORDER — ALBUTEROL SULFATE HFA 108 (90 BASE) MCG/ACT IN AERS: 2.0000 | INHALATION_SPRAY | Freq: Four times a day (QID) | RESPIRATORY_TRACT | Status: DC | PRN

## 2019-11-12 MED ORDER — METHYLPREDNISOLONE SODIUM SUCC 125 MG IJ SOLR: 125.0000 mg | Freq: Once | INTRAMUSCULAR | Status: DC | PRN

## 2019-11-12 MED ORDER — GUAIFENESIN-DM 100-10 MG/5ML PO SYRP: 10.0000 mL | ORAL_SOLUTION | ORAL | Status: DC | PRN

## 2019-11-12 MED ORDER — SODIUM CHLORIDE 0.9 % IV BOLUS
500.0000 mL | Freq: Once | INTRAVENOUS | Status: AC
  Administered 2019-11-12: 500 mL via INTRAVENOUS

## 2019-11-12 MED ORDER — DEXAMETHASONE SODIUM PHOSPHATE 10 MG/ML IJ SOLN
6.0000 mg | INTRAMUSCULAR | Status: DC
  Administered 2019-11-13: 6 mg via INTRAVENOUS
  Filled 2019-11-12: qty 1

## 2019-11-12 MED ORDER — ONDANSETRON HCL 4 MG PO TABS: 4.0000 mg | ORAL_TABLET | Freq: Four times a day (QID) | ORAL | Status: DC | PRN

## 2019-11-12 MED ORDER — ZINC SULFATE 220 (50 ZN) MG PO CAPS
220.0000 mg | ORAL_CAPSULE | Freq: Every day | ORAL | Status: DC
  Administered 2019-11-13: 220 mg via ORAL
  Filled 2019-11-12: qty 1

## 2019-11-12 MED ORDER — LORAZEPAM 2 MG/ML IJ SOLN
0.5000 mg | Freq: Once | INTRAMUSCULAR | Status: AC
  Administered 2019-11-12: 0.5 mg via INTRAVENOUS
  Filled 2019-11-12: qty 1

## 2019-11-12 MED ORDER — EPINEPHRINE 0.3 MG/0.3ML IJ SOAJ: 0.3000 mg | Freq: Once | INTRAMUSCULAR | Status: DC | PRN

## 2019-11-12 MED ORDER — SODIUM CHLORIDE 0.9 % IV SOLN
1200.0000 mg | Freq: Once | INTRAVENOUS | Status: AC
  Administered 2019-11-12: 1200 mg via INTRAVENOUS
  Filled 2019-11-12: qty 10

## 2019-11-12 MED ORDER — FAMOTIDINE IN NACL 20-0.9 MG/50ML-% IV SOLN: 20.0000 mg | Freq: Once | INTRAVENOUS | Status: DC | PRN

## 2019-11-12 MED ORDER — DIPHENHYDRAMINE HCL 50 MG/ML IJ SOLN: 50.0000 mg | Freq: Once | INTRAMUSCULAR | Status: DC | PRN

## 2019-11-12 NOTE — ED Notes (Signed)
Pt information sheet given on [place]

## 2019-11-12 NOTE — ED Provider Notes (Signed)
[facility] EMERGENCY DEPARTMENT Provider Note   CSN: [id number] Arrival date & time: 11/12/19  1141     History Chief Complaint  Patient presents with   Weakness    [name] is a 69 y.o. male.  JAMICAH ANSTEAD is a 69 y.o. male with a history of MI, Crohn's disease, hypertension, hyperlipidemia, who presents to the ED via EMS with complaints of 10 days of diarrhea, shortness of breath and weakness.  His wife is currently hospitalized in the ICU with Covid, patient has not been tested for Covid himself.  Per EMS patient was at 92% when they arrived but had labored breathing and was placed on 3 L nasal cannula.  EMS reports that there was a home health nurse present who states that patient sats were 81% at one point.  Patient reports that he has been having persistent diarrhea for the past 10 days, without associated vomiting or abdominal pain.  He reports worsening weakness and shortness of breath in particular over the past few days.  Reports chest pain present only when he tries to take a deep breath, he reports an occasional dry cough.  No fevers or chills.  Patient did not receive his COVID-19 vaccine.        Past Medical History:  Diagnosis Date   Anxiety    Articular cartilage disorder involving shoulder region, left 11/2016   Bursitis of shoulder, left 82/9937   Complication of anesthesia    hx. of heart rate dropping during 2 surgeries   (2004- back surgery and with Lithotrispy )   Crohn's disease ([place])    Depression    Dyspnea    with exertion   History of hepatitis    unknown type - 1970s   History of kidney stones    Hyperlipidemia    unable to tolerat statins   Hypertension    states under control with meds., has been on med. since 1994   Immature cataract of both eyes    Limited joint range of motion    cervical spine   Myocardial infarct ([place]) 09/13/1992   Nonobstructive atherosclerosis of coronary artery    Numbness in both hands     Obesity    Osteoarthritis 11/2016   left shoulder   Rotator cuff tear, left 11/2016   Sleep apnea    uses BiPAP, but having issues with mask not sealing    Thyroid nodule     Patient Active Problem List   Diagnosis Date Noted   Cervical spinal stenosis 11/12/2015   Crohn disease ([place]) 01/20/2015   Rectal fistula 01/20/2015   Noninfectious gastroenteritis, unspecified    Rectal bleeding 10/21/2014   Bowel habit changes 10/21/2014   Obesity (BMI 30.0-34.9) 05/18/2014   OSA (obstructive sleep apnea) 03/30/2013   Chest pain 02/15/2013   HTN (hypertension) 02/15/2013   CAD (coronary artery disease) 02/15/2013   Dyslipidemia 02/15/2013   HAND, ARTHRITIS, DEGEN./OSTEO 12/01/2008   TRIGGER FINGER 12/01/2008    Past Surgical History:  Procedure Laterality Date   ANAL FISTULOTOMY N/A 06/28/2013   Procedure: ANAL FISTULOTOMY;  Surgeon: [name], MD;  Location: [facility];  Service: General;  Laterality: N/A;   ANAL FISTULOTOMY  2017   ANTERIOR CERVICAL DECOMP/DISCECTOMY FUSION N/A 11/12/2015   Procedure: ANTERIOR CERVICAL DECOMPRESSION/DISCECTOMY FUSION CERVICAL THREE- CERVICAL FOUR, CERVICAL FOUR- CERVICAL FIVE;  Surgeon: Jovita Gamma, MD;  Location: [place];  Service: Neurosurgery;  Laterality: N/A;  ANTERIOR CERVICAL DECOMPRESSION/DISCECTOMY FUSION C3-C4, C4-C5   BIOPSY THYROID  CARDIAC CATHETERIZATION  1994, 12/25/2007   a. PTCA alone in 1994 b. RCA CTO w/ L-R collaterals, 40-50% prox RCA, 20% prox LAD; EF 50-55%, inferoapical HK   CARDIAC CATHETERIZATION  04/02/2001   CARDIAC CATHETERIZATION  11/07/2000   CARDIAC CATHETERIZATION  09/24/1996   CARPAL TUNNEL RELEASE Left 10/09/2014   Procedure: LEFT CARPAL TUNNEL RELEASE;  Surgeon: [name], MD;  Location: [place];  Service: Orthopedics;  Laterality: Left;   CARPAL TUNNEL RELEASE Right 08/28/2003   CAUTERY OF TURBINATES  11/05/2003   COLONOSCOPY N/A 11/06/2014   RMR: Skipped  Colonic ulcerations with significant ileocecal valve involvement and rectal sparing most consisitant with Crohns disease. Minimal non-steroidal drug use. Probable colonic lipoma   CYSTECTOMY     INCISION AND DRAINAGE ABSCESS Right 05/23/2012   Procedure: INCISION AND DRAINAGE ABSCESS;  Surgeon: [name], MD;  Location: [facility];  Service: General;  Laterality: Right;   KNEE ARTHROSCOPY     LITHOTRIPSY     LUMBAR FUSION  02/18/2002   LUMBAR LAMINECTOMY  02/18/2002   L4-5   NASAL SEPTUM SURGERY  11/05/2003   PARTIAL NEPHRECTOMY Left    age 29   SHOULDER ARTHROSCOPY WITH SUBACROMIAL DECOMPRESSION, ROTATOR CUFF REPAIR AND BICEP TENDON REPAIR Left 11/30/2016   Procedure: LEFT SHOULDER ARTHROSCOPY, DEBRIDEMENT, ACROMIOPLASTY WITH DISTAL CLAVICAL EXCISION, POSSIBLE ROTATOR CUFF REPAIR AND BICEP TENODESIS;  Surgeon: Ninetta Lights, MD;  Location: [place];  Service: Orthopedics;  Laterality: Left;   SPLENECTOMY     age 43   TRANSTHORACIC ECHOCARDIOGRAM  03/05/2009   EF >55%, normal LV wall thickness.       Family History  Problem Relation Age of Onset   CAD Father        CABG    CAD Brother        CABG in 53s   CAD Brother    Arthritis/Rheumatoid Mother    Hyperlipidemia Mother    Thrombocytopenia Mother    COPD Son        smoker   Alzheimer's disease Maternal Grandmother    Lung disease Paternal Grandfather     Social History   Tobacco Use   Smoking status: Former Smoker    Packs/day: 0.00    Years: 18.00    Pack years: 0.00    Quit date: 02/06/1993    Years since quitting: 26.7   Smokeless tobacco: Former Counsellor Use: Never used  Substance Use Topics   Alcohol use: No    Alcohol/week: 0.0 standard drinks   Drug use: No    Home Medications Prior to Admission medications   Medication Sig Start Date End Date Taking? Authorizing Provider  ALPRAZolam Duanne Moron) 0.5 MG tablet Take 1 tablet (0.5 mg total) by mouth 3 (three) times  daily as needed (dizziness). 12/31/16   Evalee Jefferson, PA-C  amLODipine (NORVASC) 5 MG tablet Take 1 tablet (5 mg total) by mouth daily. 03/28/13   Troy Sine, MD  carbamide peroxide (DEBROX) 6.5 % OTIC solution Place 5 drops into both ears 2 (two) times daily as needed (FOR EAR WAX REMOVAL).    [provider]  Evolocumab (REPATHA SURECLICK) 379 MG/ML SOAJ Inject 140 mg into the skin every 14 (fourteen) days. 02/12/18   Troy Sine, MD  famotidine (PEPCID) 10 MG tablet Take 10-20 mg by mouth 2 (two) times daily.    [provider]  metoprolol succinate (TOPROL-XL) 50 MG 24 hr tablet Take 50 mg  by mouth daily. Take with or immediately following a meal.    [provider]  nitroGLYCERIN (NITROSTAT) 0.4 MG SL tablet Place 1 tablet (0.4 mg total) under the tongue every 5 (five) minutes as needed for chest pain. 01/12/17   Troy Sine, MD  Probiotic Product (PROBIOTIC DAILY PO) Take 1 capsule by mouth daily. [place]    [provider]  Psyllium Husk POWD Take 12 g by mouth daily. INTO 12 OUNCES OF WATER (1 TABLESPOON)    [provider]  ramipril (ALTACE) 10 MG capsule Take 10 mg by mouth daily.    [provider]  sertraline (ZOLOFT) 25 MG tablet Take 1 tablet by mouth every morning. 05/16/18   [provider]    Allergies    Niacin and related, Penicillins, and Statins  Review of Systems   Review of Systems  Constitutional: Positive for fatigue. Negative for chills and fever.  HENT: Negative for congestion, rhinorrhea and sore throat.   Eyes: Negative for visual disturbance.  Respiratory: Positive for cough and shortness of breath.   Cardiovascular: Negative for chest pain.  Gastrointestinal: Positive for diarrhea. Negative for abdominal pain, blood in stool, constipation, nausea and vomiting.  Musculoskeletal: Negative for arthralgias and myalgias.  Skin: Negative for color change and rash.  Neurological: Positive  for weakness (Generalized). Negative for dizziness, syncope and light-headedness.  All other systems reviewed and are negative.   Physical Exam Updated Vital Signs BP (!) 120/106 (BP Location: Left Arm)    Pulse 80    Temp 98.4 F (36.9 C) (Oral)    Resp 18    Ht 5\' 9"  (1.753 m)    Wt 90.7 kg    SpO2 96%    BMI 29.53 kg/m   Physical Exam Vitals and nursing note reviewed.  Constitutional:      General: He is not in acute distress.    Appearance: Normal appearance. He is well-developed. He is ill-appearing. He is not diaphoretic.     Comments: Elderly gentleman is somewhat ill-appearing but in no acute distress  HENT:     Head: Normocephalic and atraumatic.     Mouth/Throat:     Mouth: Mucous membranes are dry.     Pharynx: Oropharynx is clear.     Comments: Mucous membranes slightly dry Eyes:     General:        Right eye: No discharge.        Left eye: No discharge.     Pupils: Pupils are equal, round, and reactive to light.  Cardiovascular:     Rate and Rhythm: Normal rate and regular rhythm.     Heart sounds: Normal heart sounds.  Pulmonary:     Effort: No respiratory distress.     Breath sounds: Normal breath sounds. No wheezing or rales.     Comments: Tachypneic but able to speak in full sentences, was placed on 3 L nasal cannula for work of breathing, but on room air patient with sats greater than 92%, no respiratory distress, on auscultation lungs are clear with some decreased air movement in the bases bilaterally. Abdominal:     General: Bowel sounds are normal. There is no distension.     Palpations: Abdomen is soft. There is no mass.     Tenderness: There is no abdominal tenderness. There is no guarding.     Comments: Abdomen soft, nondistended, nontender to palpation in all quadrants without guarding or peritoneal signs  Musculoskeletal:  General: No deformity.     Cervical back: Neck supple.     Right lower leg: No edema.     Left lower leg: No edema.      Comments: No lower extremity edema, legs are warm and well perfused  Skin:    General: Skin is warm and dry.     Capillary Refill: Capillary refill takes less than 2 seconds.  Neurological:     Mental Status: He is alert and oriented to person, place, and time.     Coordination: Coordination normal.     Comments: Speech is clear, able to follow commands Moves extremities without ataxia, coordination intact  Psychiatric:        Mood and Affect: Mood normal.        Behavior: Behavior normal.     ED Results / Procedures / Treatments   Labs (all labs ordered are listed, but only abnormal results are displayed) Labs Reviewed  COMPREHENSIVE METABOLIC PANEL - Abnormal; Notable for the following components:      Result Value   Sodium 128 (*)    CO2 17 (*)    Creatinine, Ser 1.28 (*)    Calcium 8.1 (*)    AST 43 (*)    GFR calc non Af Amer 57 (*)    All other components within normal limits  D-DIMER, QUANTITATIVE (NOT AT [facility]) - Abnormal; Notable for the following components:   D-Dimer, Quant 0.67 (*)    All other components within normal limits  FERRITIN - Abnormal; Notable for the following components:   Ferritin 508 (*)    All other components within normal limits  FIBRINOGEN - Abnormal; Notable for the following components:   Fibrinogen 519 (*)    All other components within normal limits  C-REACTIVE PROTEIN - Abnormal; Notable for the following components:   CRP 1.7 (*)    All other components within normal limits  CULTURE, BLOOD (ROUTINE X 2)  RESPIRATORY PANEL BY RT PCR (FLU A&B, COVID)  CULTURE, BLOOD (ROUTINE X 2)  LACTIC ACID, PLASMA  CBC WITH DIFFERENTIAL/PLATELET  PROCALCITONIN  LACTATE DEHYDROGENASE  TRIGLYCERIDES  LACTIC ACID, PLASMA    EKG None  Radiology DG Chest Port 1 View  Result Date: 11/12/2019 CLINICAL DATA:  Cough, shortness of breath EXAM: PORTABLE CHEST 1 VIEW COMPARISON:  Jun 14, 2013 FINDINGS: The cardiomediastinal silhouette is unchanged in  contour. No pleural effusion. No pneumothorax. Patchy LEFT greater than RIGHT peripheral predominant heterogeneous opacities. Visualized abdomen is unremarkable. Multilevel degenerative changes of the thoracic spine. IMPRESSION: Patchy LEFT greater than RIGHT peripheral predominant heterogeneous opacities. Findings may reflect the sequela of COVID-19 infection versus atelectasis. Electronically Signed   By: Valentino Saxon MD   On: 11/12/2019 12:50    Procedures Procedures (including critical care time)  Medications Ordered in ED Medications  0.9 %  sodium chloride infusion (has no administration in time range)  diphenhydrAMINE (BENADRYL) injection 50 mg (has no administration in time range)  famotidine (PEPCID) IVPB 20 mg premix (has no administration in time range)  methylPREDNISolone sodium succinate (SOLU-MEDROL) 125 mg/2 mL injection 125 mg (has no administration in time range)  albuterol (VENTOLIN HFA) 108 (90 Base) MCG/ACT inhaler 2 puff (has no administration in time range)  EPINEPHrine (EPI-PEN) injection 0.3 mg (has no administration in time range)  sodium chloride 0.9 % bolus 500 mL (0 mLs Intravenous Stopped 11/12/19 1428)  casirivimab-imdevimab (REGEN-COV) 1,200 mg in sodium chloride 0.9 % 110 mL IVPB (0 mg Intravenous Stopped 11/12/19 1428)  sodium chloride 0.9 % bolus 500 mL (500 mLs Intravenous New Bag/Given 11/12/19 1559)    ED Course  I have reviewed the triage vital signs and the nursing notes.  Pertinent labs & imaging results that were available during my care of the patient were reviewed by me and considered in my medical decision making (see chart for details).    MDM Rules/Calculators/A&P                           69 year old male with 10 days of shortness of breath, weakness and diarrhea, wife currently hospitalized with Covid but patient has not been tested.  High clinical suspicion for Covid, patient was placed on O2 for work of breathing, but on reevaluation he  was able to be taken off of oxygen and ambulated maintaining normal O2 sats, with mild tachypnea.  Lungs are clear aside from some diminished breath sounds bilaterally.  Patient has been having persistent diarrhea for 10 days and has had some borderline soft blood pressures.  Will get lab work as well as Covid inflammatory markers and chest x-ray.  Abdomen is soft do not feel that abdominal imaging is indicated.  Will give IV fluids.  Given that patient is not hypoxic, he may be a candidate for discharge and this is the last day he could potentially get the Regeneron monoclonal antibodies which patient is interested in, will give antibody infusion.  Lab work returned, patient is Covid positive.  He has no leukocytosis and normal hemoglobin.  He does have hyponatremia at 128, with a CO2 of 17 and creatinine of 1.28 which is increased from his baseline of 0.8 suggesting dehydration, with these lab derangements and continued diarrhea patient is at risk for worsening kidney function and hyponatremia.  He has been given IV fluid boluses.  He has elevation of multiple inflammatory markers.  Chest x-ray with some bibasilar opacities worse on the left than the right, which likely indicates some Covid pneumonia.  Even though patient is not currently hypoxic given lab derangements and concern for worsening feel he would benefit from admission.  Case discussed with Dr. Denton Brick with [facility] hospitalist who will see and admit the patient.  Final Clinical Impression(s) / ED Diagnoses Final diagnoses:  CBULA-45 virus infection  Diarrhea, unspecified type  AKI (acute kidney injury) ([place])  Dehydration    Rx / DC Orders ED Discharge Orders    None       Janet Berlin 11/12/19 1625    Carmin Muskrat, MD 11/13/19 1622

## 2019-11-12 NOTE — ED Notes (Signed)
Pt daughter Kathreen Devoid mobile [phone], home [phone].

## 2019-11-12 NOTE — ED Triage Notes (Signed)
Pt presents to ED with complaints of SOB, weakness, diarrhea x 10 days. Wife is Covid positive and admitted to ICU. Sats per EMS arrival was 92% started on 3L but labored breathing.

## 2019-11-12 NOTE — H&P (Signed)
History and Physical    [name] DJS:[id number] DOB: 11-02-1950 DOA: 11/12/2019  PCP: Redmond School, MD   Patient coming from: Home  I have personally briefly reviewed patient's old medical records in [place]  Chief Complaint: SOB, diarrhea  HPI: [name] is a 69 y.o. male with medical history significant for HTN, CAD, Crohn's, OSA. Patient presented to the Ed with c/o difficulty breathing, weakness and diarrhea of 10 days duration. Patients wife is currently admitted here in the ICU with COVID. He reports multiple loose stools, unable to count. At baseline he normally has 4 - 5 bowel movements a day.   ED Course: temp 98.4, O2 sats 94% on room hair, maintained o2 sats with ambulation. Na 128, Cr 1.28, Lactic acid- 1.1. Mild elevation in inflammatory markers. Chest Xray consistent with COVID PNA L  > R.  Monocolonal antibody infusion given to patient in the hopes of been able to send him home, but later decision was made to admit.   Review of Systems: As per HPI all other systems reviewed and negative.  Past Medical History:  Diagnosis Date  . Anxiety   . Articular cartilage disorder involving shoulder region, left 11/2016  . Bursitis of shoulder, left 11/2016  . Complication of anesthesia    hx. of heart rate dropping during 2 surgeries   (2004- back surgery and with Lithotrispy )  . Crohn's disease ([place])   . Depression   . Dyspnea    with exertion  . History of hepatitis    unknown type - 1970s  . History of kidney stones   . Hyperlipidemia    unable to tolerat statins  . Hypertension    states under control with meds., has been on med. since 1994  . Immature cataract of both eyes   . Limited joint range of motion    cervical spine  . Myocardial infarct ([place]) 09/13/1992  . Nonobstructive atherosclerosis of coronary artery   . Numbness in both hands   . Obesity   . Osteoarthritis 11/2016   left shoulder  . Rotator cuff tear, left 11/2016  . Sleep  apnea    uses BiPAP, but having issues with mask not sealing   . Thyroid nodule     Past Surgical History:  Procedure Laterality Date  . ANAL FISTULOTOMY N/A 06/28/2013   Procedure: ANAL FISTULOTOMY;  Surgeon: [name], MD;  Location: [facility];  Service: General;  Laterality: N/A;  . ANAL FISTULOTOMY  2017  . ANTERIOR CERVICAL DECOMP/DISCECTOMY FUSION N/A 11/12/2015   Procedure: ANTERIOR CERVICAL DECOMPRESSION/DISCECTOMY FUSION CERVICAL THREE- CERVICAL FOUR, CERVICAL FOUR- CERVICAL FIVE;  Surgeon: Jovita Gamma, MD;  Location: [facility];  Service: Neurosurgery;  Laterality: N/A;  ANTERIOR CERVICAL DECOMPRESSION/DISCECTOMY FUSION C3-C4, C4-C5  . BIOPSY THYROID    . CARDIAC CATHETERIZATION  1994, 12/25/2007   a. PTCA alone in 1994 b. RCA CTO w/ L-R collaterals, 40-50% prox RCA, 20% prox LAD; EF 50-55%, inferoapical HK  . CARDIAC CATHETERIZATION  04/02/2001  . CARDIAC CATHETERIZATION  11/07/2000  . CARDIAC CATHETERIZATION  09/24/1996  . CARPAL TUNNEL RELEASE Left 10/09/2014   Procedure: LEFT CARPAL TUNNEL RELEASE;  Surgeon: [name], MD;  Location: [facility];  Service: Orthopedics;  Laterality: Left;  . CARPAL TUNNEL RELEASE Right 08/28/2003  . CAUTERY OF TURBINATES  11/05/2003  . COLONOSCOPY N/A 11/06/2014   RMR: Skipped Colonic ulcerations with significant ileocecal valve involvement and rectal sparing most consisitant with Crohns disease. Minimal non-steroidal  drug use. Probable colonic lipoma  . CYSTECTOMY    . INCISION AND DRAINAGE ABSCESS Right 05/23/2012   Procedure: INCISION AND DRAINAGE ABSCESS;  Surgeon: [name], MD;  Location: [facility];  Service: General;  Laterality: Right;  . KNEE ARTHROSCOPY    . LITHOTRIPSY    . LUMBAR FUSION  02/18/2002  . LUMBAR LAMINECTOMY  02/18/2002   L4-5  . NASAL SEPTUM SURGERY  11/05/2003  . PARTIAL NEPHRECTOMY Left    age 82  . SHOULDER ARTHROSCOPY WITH SUBACROMIAL DECOMPRESSION, ROTATOR CUFF REPAIR AND BICEP TENDON REPAIR Left  11/30/2016   Procedure: LEFT SHOULDER ARTHROSCOPY, DEBRIDEMENT, ACROMIOPLASTY WITH DISTAL CLAVICAL EXCISION, POSSIBLE ROTATOR CUFF REPAIR AND BICEP TENODESIS;  Surgeon: Ninetta Lights, MD;  Location: [facility];  Service: Orthopedics;  Laterality: Left;  . SPLENECTOMY     age 8  . TRANSTHORACIC ECHOCARDIOGRAM  03/05/2009   EF >55%, normal LV wall thickness.     reports that he quit smoking about 26 years ago. He smoked 0.00 packs per day for 18.00 years. He has quit using smokeless tobacco. He reports that he does not drink alcohol and does not use drugs.  Allergies  Allergen Reactions  . Niacin And Related Other (See Comments)    FLUSHING  . Penicillins Hives and Itching    Has patient had a PCN reaction causing immediate rash, facial/tongue/throat swelling, SOB or lightheadedness with hypotension: Unknown Has patient had a PCN reaction causing severe rash involving mucus membranes or skin necrosis: Unknown Has patient had a PCN reaction that required hospitalization: Unknown Has patient had a PCN reaction occurring within the last 10 years: No If all of the above answers are "NO", then may proceed with Cephalosporin use.   . Statins Other (See Comments)    MYALGIAS    Family History  Problem Relation Age of Onset  . CAD Father        CABG   . CAD Brother        CABG in 61s  . CAD Brother   . Arthritis/Rheumatoid Mother   . Hyperlipidemia Mother   . Thrombocytopenia Mother   . COPD Son        smoker  . Alzheimer's disease Maternal Grandmother   . Lung disease Paternal Grandfather    Prior to Admission medications   Medication Sig Start Date End Date Taking? Authorizing Provider  amLODipine (NORVASC) 5 MG tablet Take 1 tablet (5 mg total) by mouth daily. 03/28/13  Yes Troy Sine, MD  metoprolol succinate (TOPROL-XL) 50 MG 24 hr tablet Take 50 mg by mouth daily. Take with or immediately following a meal.   Yes [provider]  Probiotic Product (PROBIOTIC DAILY  PO) Take 1 capsule by mouth daily. SUPREMA DOPHILUS   Yes [provider]  Psyllium Husk POWD Take 12 g by mouth daily. INTO 12 OUNCES OF WATER (1 TABLESPOON)   Yes [provider]  ramipril (ALTACE) 10 MG capsule Take 10 mg by mouth daily.   Yes [provider]  ALPRAZolam (XANAX) 0.5 MG tablet Take 1 tablet (0.5 mg total) by mouth 3 (three) times daily as needed (dizziness). Patient not taking: Reported on 11/12/2019 12/31/16   Evalee Jefferson, PA-C  Evolocumab (REPATHA SURECLICK) 660 MG/ML SOAJ Inject 140 mg into the skin every 14 (fourteen) days. Patient not taking: Reported on 11/12/2019 02/12/18   Troy Sine, MD  nitroGLYCERIN (NITROSTAT) 0.4 MG SL tablet Place 1 tablet (0.4 mg total) under the tongue every  5 (five) minutes as needed for chest pain. 01/12/17   Troy Sine, MD    Physical Exam: Vitals:   11/12/19 1345 11/12/19 1358 11/12/19 1415 11/12/19 1427  BP: 115/72  112/62 99/63  Pulse:  75 79 78  Resp:    18  Temp:  98.5 F (36.9 C)  98.5 F (36.9 C)  TempSrc:  Oral  Oral  SpO2:  96% 95% 96%  Weight:      Height:        Constitutional: NAD, calm, comfortable Vitals:   11/12/19 1345 11/12/19 1358 11/12/19 1415 11/12/19 1427  BP: 115/72  112/62 99/63  Pulse:  75 79 78  Resp:    18  Temp:  98.5 F (36.9 C)  98.5 F (36.9 C)  TempSrc:  Oral  Oral  SpO2:  96% 95% 96%  Weight:      Height:       Eyes: PERRL, lids and conjunctivae normal ENMT: Mucous membranes are moist. Posterior pharynx clear of any exudate or lesions.Normal dentition.  Neck: normal, supple, no masses, no thyromegaly Respiratory: Normal respiratory effort. No accessory muscle use.  Cardiovascular: Regular rate and rhythm, no murmurs / rubs / gallops. No extremity edema. 2+ pedal pulses.  Abdomen: no tenderness, no masses palpated. No hepatosplenomegaly. Bowel sounds positive.  Musculoskeletal: no clubbing / cyanosis. No joint deformity upper and lower extremities. Good  ROM, no contractures. Normal muscle tone.  Skin: no rashes, lesions, ulcers. No induration Neurologic: No apparent cranial nerve abnormality  Psychiatric: Normal judgment and insight. Alert and oriented x 3. Normal mood.   Labs on Admission: I have personally reviewed following labs and imaging studies  CBC: Recent Labs  Lab 11/12/19 1200  WBC 4.6  NEUTROABS 2.7  HGB 15.6  HCT 44.4  MCV 87.7  PLT 381   Basic Metabolic Panel: Recent Labs  Lab 11/12/19 1200  NA 128*  K 3.6  CL 100  CO2 17*  GLUCOSE 92  BUN 20  CREATININE 1.28*  CALCIUM 8.1*   Liver Function Tests: Recent Labs  Lab 11/12/19 1200  AST 43*  ALT 39  ALKPHOS 63  BILITOT 0.8  PROT 6.9  ALBUMIN 3.6   Lipid Profile: Recent Labs    11/12/19 1200  TRIG 63   Anemia Panel: Recent Labs    11/12/19 1200  FERRITIN 508*   Urine analysis:    Component Value Date/Time   COLORURINE STRAW (A) 12/31/2016 0843   APPEARANCEUR CLEAR 12/31/2016 0843   LABSPEC 1.011 12/31/2016 0843   PHURINE 6.0 12/31/2016 [place] 12/31/2016 [place] 12/31/2016 [place] 12/31/2016 [place] 12/31/2016 0843   PROTEINUR NEGATIVE 12/31/2016 0843   UROBILINOGEN 0.2 12/25/2007 0825   NITRITE NEGATIVE 12/31/2016 0843   LEUKOCYTESUR NEGATIVE 12/31/2016 0843    Radiological Exams on Admission: DG Chest Port 1 View  Result Date: 11/12/2019 CLINICAL DATA:  Cough, shortness of breath EXAM: PORTABLE CHEST 1 VIEW COMPARISON:  Jun 14, 2013 FINDINGS: The cardiomediastinal silhouette is unchanged in contour. No pleural effusion. No pneumothorax. Patchy LEFT greater than RIGHT peripheral predominant heterogeneous opacities. Visualized abdomen is unremarkable. Multilevel degenerative changes of the thoracic spine. IMPRESSION: Patchy LEFT greater than RIGHT peripheral predominant heterogeneous opacities. Findings may reflect the sequela of COVID-19 infection versus  atelectasis. Electronically Signed   By: Valentino Saxon MD   On: 11/12/2019 12:50    EKG: Independently reviewed. Sinus rhythm. QTC- 461.  Assessment/Plan Principal Problem:   Pneumonia due to COVID-19 virus Active Problems:   HTN (hypertension)   CAD (coronary artery disease)   Crohn disease ([place])    Pneumonia due to COVID- 19- Dysnea, O2 sats maintaining above (94 % on room air. Chest Xray consistent with COVID PNA- L > R. Mild elevation in inflammatory markers- Ferritin, DDimer, CRP. Procal- < 0.1.  - Trend Inflammatory Markers - CBC, CMP daily -  Patient received monoclonal antibody infusion, but refusing remdesivir (Spouse also refused). (Written note at bedside from daughter saying NO to medication) He tells me because his daughter does not want him to take it. Patient's daughter is absolutely certain she does not want her father getting remdesivir, she is worried about remdesivir causing kidney failure.  - COVID-19 admission protocol. - Mucolytics, Inhalers PRN - Multivits  Mild AKI- Cr 1.28, baseline 0.6 - 0.8. Likely pre-renal, in the setting of ramipril use. - Hold home ramipril - hydrate  Hyponatremia-128, baseline ~ 140. - Hydrate  HTN- hypotensive in Ed down to 27O systolic, improved after 1 bolus.  Likely from multiple episodes of diarrhea. - Hold metoprolol, Norvasc and Ramipril  CAD- hx of remote MI in 1994. No chest pains. EKG unchanged. Did not tolerate statins, supposed to be on evolocumab, but not taking.   OSA- Previously used a mask at night CPAP/BIPAP, no longer does.  Crohn's- Currently having diarrhea, frequency far above baseline of 4- 5 daily. Not on medication for crohn's.  - Hydrate   DVT prophylaxis: Lovenox Code Status: Full Code Family Communication: None at bedside, I talked to patient's daughter on the phone. Plan of care explained, all questions answered. Disposition Plan:  ~ 2 days, pending respiratory status.  Consults called:  None Admission status: Inpt, med-surg I certify that at the point of admission it is my clinical judgment that the patient will require inpatient hospital care spanning beyond 2 midnights from the point of admission due to high intensity of service, high risk for further deterioration and high frequency of surveillance required.    Bethena Roys MD [facility]  11/12/2019, 3:21 PM

## 2019-11-12 NOTE — ED Notes (Signed)
Pt ambulated well with no assistance. O2 stayed at 94%.

## 2019-11-13 DIAGNOSIS — J1282 Pneumonia due to coronavirus disease 2019: Secondary | ICD-10-CM

## 2019-11-13 DIAGNOSIS — U071 COVID-19: Secondary | ICD-10-CM

## 2019-11-13 LAB — CBC WITH DIFFERENTIAL/PLATELET
Basophils Absolute: 0 10*3/uL (ref 0.0–0.1)
Basophils Relative: 0 %
Eosinophils Absolute: 0 10*3/uL (ref 0.0–0.5)
Eosinophils Relative: 0 %
HCT: 40.9 % (ref 39.0–52.0)
Hemoglobin: 14.2 g/dL (ref 13.0–17.0)
Lymphocytes Relative: 17 %
Lymphs Abs: 1.1 10*3/uL (ref 0.7–4.0)
MCH: 31.1 pg (ref 26.0–34.0)
MCHC: 34.7 g/dL (ref 30.0–36.0)
MCV: 89.7 fL (ref 80.0–100.0)
Monocytes Absolute: 0.9 10*3/uL (ref 0.1–1.0)
Monocytes Relative: 14 %
Neutro Abs: 4.6 10*3/uL (ref 1.7–7.7)
Neutrophils Relative %: 69 %
Platelets: 242 10*3/uL (ref 150–400)
RBC: 4.56 MIL/uL (ref 4.22–5.81)
RDW: 14.2 % (ref 11.5–15.5)
WBC: 6.7 10*3/uL (ref 4.0–10.5)
nRBC: 0 % (ref 0.0–0.2)

## 2019-11-13 LAB — COMPREHENSIVE METABOLIC PANEL
ALT: 35 U/L (ref 0–44)
AST: 42 U/L — ABNORMAL HIGH (ref 15–41)
Albumin: 3.3 g/dL — ABNORMAL LOW (ref 3.5–5.0)
Alkaline Phosphatase: 55 U/L (ref 38–126)
Anion gap: 8 (ref 5–15)
BUN: 14 mg/dL (ref 8–23)
CO2: 18 mmol/L — ABNORMAL LOW (ref 22–32)
Calcium: 7.9 mg/dL — ABNORMAL LOW (ref 8.9–10.3)
Chloride: 105 mmol/L (ref 98–111)
Creatinine, Ser: 0.97 mg/dL (ref 0.61–1.24)
GFR calc non Af Amer: >60 mL/min (ref >60)
Glucose, Bld: 91 mg/dL (ref 70–99)
Potassium: 3.9 mmol/L (ref 3.5–5.1)
Sodium: 131 mmol/L — ABNORMAL LOW (ref 135–145)
Total Bilirubin: 0.6 mg/dL (ref 0.3–1.2)
Total Protein: 6.8 g/dL (ref 6.5–8.1)

## 2019-11-13 LAB — PHOSPHORUS: Phosphorus: 2.6 mg/dL (ref 2.5–4.6)

## 2019-11-13 LAB — C-REACTIVE PROTEIN: CRP: 5.1 mg/dL — ABNORMAL HIGH (ref <1.0)

## 2019-11-13 LAB — D-DIMER, QUANTITATIVE: D-Dimer, Quant: 0.83 ug/mL-FEU — ABNORMAL HIGH (ref 0.00–0.50)

## 2019-11-13 LAB — MAGNESIUM: Magnesium: 1.9 mg/dL (ref 1.7–2.4)

## 2019-11-13 LAB — FERRITIN: Ferritin: 606 ng/mL — ABNORMAL HIGH (ref 24–336)

## 2019-11-13 MED ORDER — ASCORBIC ACID 500 MG PO TABS: 500.0000 mg | ORAL_TABLET | Freq: Every day | ORAL | 2 refills | Status: AC

## 2019-11-13 MED ORDER — DEXAMETHASONE 6 MG PO TABS: 6.0000 mg | ORAL_TABLET | Freq: Every day | ORAL | 0 refills | Status: AC

## 2019-11-13 MED ORDER — GUAIFENESIN-DM 100-10 MG/5ML PO SYRP: 10.0000 mL | ORAL_SOLUTION | ORAL | 0 refills | Status: DC | PRN

## 2019-11-13 MED ORDER — LOPERAMIDE HCL 2 MG PO CAPS
2.0000 mg | ORAL_CAPSULE | Freq: Two times a day (BID) | ORAL | 0 refills | Status: DC | PRN
Start: 2019-11-13 — End: 2021-03-02

## 2019-11-13 MED ORDER — ALBUTEROL SULFATE HFA 108 (90 BASE) MCG/ACT IN AERS: 2.0000 | INHALATION_SPRAY | RESPIRATORY_TRACT | 1 refills | Status: DC | PRN

## 2019-11-13 MED ORDER — RAMIPRIL 10 MG PO CAPS: 10.0000 mg | ORAL_CAPSULE | Freq: Every day | ORAL | 0 refills | Status: AC

## 2019-11-13 MED ORDER — ONDANSETRON HCL 4 MG PO TABS
4.0000 mg | ORAL_TABLET | Freq: Four times a day (QID) | ORAL | 0 refills | Status: DC | PRN
Start: 2019-11-13 — End: 2021-03-02

## 2019-11-13 MED ORDER — POTASSIUM CHLORIDE IN NACL 40-0.9 MEQ/L-% IV SOLN: INTRAVENOUS | Status: DC

## 2019-11-13 MED ORDER — ZINC SULFATE 220 (50 ZN) MG PO CAPS: 220.0000 mg | ORAL_CAPSULE | Freq: Every day | ORAL | 0 refills | Status: AC

## 2019-11-13 NOTE — Progress Notes (Signed)
IS and flutter in room. Patient having bad coughing spell at this time. Unable to perform.

## 2019-11-13 NOTE — Care Management Obs Status (Signed)
[facility] NOTIFICATION   Patient Details  Name: [name] MRN: [id number] Date of Birth: [birth year]   Medicare Observation Status Notification Given:  Yes    Ihor Gully, LCSW 11/13/2019, 12:28 PM

## 2019-11-13 NOTE — Discharge Summary (Signed)
[name], is a 68 y.o. male  DOB [birth year]  MRN [id number].  Admission date:  11/12/2019  Admitting Physician  Bethena Roys, MD  Discharge Date:  11/13/2019   Primary MD  Redmond School, MD  Recommendations for primary care physician for things to follow:    1) You are strongly advised to  to isolate/quarantine for at least 21 days from the date of your diagnoses with COVID-19 infection--please always wear a mask if you have to go outside the house  2)You will be eligible for COVID-19 vaccination in about 90 days from the date of your monoclonal antibody infusion (90 days from 11/12/19)--- the decision to get the vaccine or Not to get the vaccine will be up to you and your personal physician  3)Avoid ibuprofen/Advil/Aleve/Motrin/Goody Powders/Naproxen/BC powders/Meloxicam/Diclofenac/Indomethacin and other Nonsteroidal anti-inflammatory medications as these will make you more likely to bleed and can cause stomach ulcers, can also cause Kidney problems.   4)Please do NOT  Restart Ramipril until 12/09/2019  5) please maintain adequate hydration/drink fluids especially given your recent episodes of diarrhea--- May use Imodium as needed for excessive diarrhea  6) you advised to have a video/virtual follow-up visit with your primary care physician within a week for reevaluation and recheck  7) please use flutter valve/incentive spirometry device at least every hour to help your lungs and breathing  Admission Diagnosis  Dehydration [E86.0] AKI (acute kidney injury) ([place]) [N17.9] Diarrhea, unspecified type [R19.7] COVID-19 virus infection [U07.1] Pneumonia due to COVID-19 virus [U07.1, J12.82]   Discharge Diagnosis  Dehydration [E86.0] AKI (acute kidney injury) ([place]) [N17.9] Diarrhea, unspecified type [R19.7] COVID-19 virus infection [U07.1] Pneumonia due to COVID-19 virus [U07.1, J12.82]     Principal Problem:   Pneumonia due to COVID-19 virus Active Problems:   HTN (hypertension)   CAD (coronary artery disease)   Crohn disease ([place])      Past Medical History:  Diagnosis Date  . Anxiety   . Articular cartilage disorder involving shoulder region, left 11/2016  . Bursitis of shoulder, left 11/2016  . Complication of anesthesia    hx. of heart rate dropping during 2 surgeries   (2004- back surgery and with Lithotrispy )  . Crohn's disease ([place])   . Depression   . Dyspnea    with exertion  . History of hepatitis    unknown type - 1970s  . History of kidney stones   . Hyperlipidemia    unable to tolerat statins  . Hypertension    states under control with meds., has been on med. since 1994  . Immature cataract of both eyes   . Limited joint range of motion    cervical spine  . Myocardial infarct ([place]) 09/13/1992  . Nonobstructive atherosclerosis of coronary artery   . Numbness in both hands   . Obesity   . Osteoarthritis 11/2016   left shoulder  . Rotator cuff tear, left 11/2016  . Sleep apnea    uses BiPAP, but having issues with mask not sealing   .  Thyroid nodule     Past Surgical History:  Procedure Laterality Date  . ANAL FISTULOTOMY N/A 06/28/2013   Procedure: ANAL FISTULOTOMY;  Surgeon: [name], MD;  Location: [facility];  Service: General;  Laterality: N/A;  . ANAL FISTULOTOMY  2017  . ANTERIOR CERVICAL DECOMP/DISCECTOMY FUSION N/A 11/12/2015   Procedure: ANTERIOR CERVICAL DECOMPRESSION/DISCECTOMY FUSION CERVICAL THREE- CERVICAL FOUR, CERVICAL FOUR- CERVICAL FIVE;  Surgeon: Jovita Gamma, MD;  Location: [place];  Service: Neurosurgery;  Laterality: N/A;  ANTERIOR CERVICAL DECOMPRESSION/DISCECTOMY FUSION C3-C4, C4-C5  . BIOPSY THYROID    . CARDIAC CATHETERIZATION  1994, 12/25/2007   a. PTCA alone in 1994 b. RCA CTO w/ L-R collaterals, 40-50% prox RCA, 20% prox LAD; EF 50-55%, inferoapical HK  . CARDIAC CATHETERIZATION  04/02/2001  . CARDIAC  CATHETERIZATION  11/07/2000  . CARDIAC CATHETERIZATION  09/24/1996  . CARPAL TUNNEL RELEASE Left 10/09/2014   Procedure: LEFT CARPAL TUNNEL RELEASE;  Surgeon: [name], MD;  Location: [place];  Service: Orthopedics;  Laterality: Left;  . CARPAL TUNNEL RELEASE Right 08/28/2003  . CAUTERY OF TURBINATES  11/05/2003  . COLONOSCOPY N/A 11/06/2014   RMR: Skipped Colonic ulcerations with significant ileocecal valve involvement and rectal sparing most consisitant with Crohns disease. Minimal non-steroidal drug use. Probable colonic lipoma  . CYSTECTOMY    . INCISION AND DRAINAGE ABSCESS Right 05/23/2012   Procedure: INCISION AND DRAINAGE ABSCESS;  Surgeon: [name], MD;  Location: [facility];  Service: General;  Laterality: Right;  . KNEE ARTHROSCOPY    . LITHOTRIPSY    . LUMBAR FUSION  02/18/2002  . LUMBAR LAMINECTOMY  02/18/2002   L4-5  . NASAL SEPTUM SURGERY  11/05/2003  . PARTIAL NEPHRECTOMY Left    age 69  . SHOULDER ARTHROSCOPY WITH SUBACROMIAL DECOMPRESSION, ROTATOR CUFF REPAIR AND BICEP TENDON REPAIR Left 11/30/2016   Procedure: LEFT SHOULDER ARTHROSCOPY, DEBRIDEMENT, ACROMIOPLASTY WITH DISTAL CLAVICAL EXCISION, POSSIBLE ROTATOR CUFF REPAIR AND BICEP TENODESIS;  Surgeon: Ninetta Lights, MD;  Location: [place];  Service: Orthopedics;  Laterality: Left;  . SPLENECTOMY     age 74  . TRANSTHORACIC ECHOCARDIOGRAM  03/05/2009   EF >55%, normal LV wall thickness.      HPI  from the history and physical done on the day of admission:   HPI: [name] is a 69 y.o. male with medical history significant for HTN, CAD, Crohn's, OSA. Patient presented to the Ed with c/o difficulty breathing, weakness and diarrhea of 10 days duration. Patients wife is currently admitted here in the ICU with COVID. He reports multiple loose stools, unable to count. At baseline he normally has 4 - 5 bowel movements a day.   ED Course: temp 98.4, O2 sats 94% on room hair, maintained o2 sats with  ambulation. Na 128, Cr 1.28, Lactic acid- 1.1. Mild elevation in inflammatory markers. Chest Xray consistent with COVID PNA L  > R.  Monocolonal antibody infusion given to patient in the hopes of been able to send him home, but later decision was made to admit.      Hospital Course:   1) pneumonia due to COVID-19 infection--- O2 sats remained in the mid 90s even post ambulation on room air -Patient is Not vaccinated against COVID-19 infection -Patient and daughter declined remdesivir infusion -Patient accepted monoclonal antibody fusion on 11/12/2019 -Respiratory status remains stable without hypoxia as noted above -Discharge home with as needed albuterol inhaler, okay to use spirometry/flutter valve and as needed OTC cough medications -Repeat  labs noted with phosphorus and magnesium WNL -Ferritin is 606, CRP is 5.1 -D-dimer 0.83, CBC WNL -Procalcitonin less than 0.10, lactic acid 1.1  2) Covid induced gastroenteritis--- diarrhea is resolving, may use OTC Imodium prn  -Patient with history of Crohn's colitis but apparently no recent flareup, no routine meds for Crohn's  3) dehydration/AKI/hyponatremia--- most likely due to dehydration in the setting of poor oral intake due to Covid infection as well as diarrhea due to Covid gastroenteritis, compounded by ramipril use---  improved with IV fluids -Sodium up to 131 from 128 -Creatinine down to 0.97 from 1.28--- -patient received additional liter of normal saline after the above labs were done this morning----anticipate that these electrolytes and renal function have improved even further since the a.m. labs were drawn -Continue to hold ramipril  4)H/o CAD-prior MI 9094, asymptomatic at this time, apparently has some intolerance to statins, not compliant with evolocumab -Continue Toprol-XL  5)HTN--patient was hypotensive on admission due to dehydration/volume depletion -BP has normalized, okay to restart metoprolol and amlodipine -Okay to  continue to hold ramipril  Social--- I called and discussed patient's condition and plan of care and discharge plan with patient's daughter Ms. Hanks. Questions answered  Discharge Condition: Stable without hypoxia  Follow UP--- PCP for virtual visit/recheck and reevaluation   Consults obtained - na  Diet and Activity recommendation:  As advised  Discharge Instructions    Discharge Instructions    Call MD for:  difficulty breathing, headache or visual disturbances   Complete by: As directed    Call MD for:  persistant dizziness or light-headedness   Complete by: As directed    Call MD for:  persistant nausea and vomiting   Complete by: As directed    Call MD for:  severe uncontrolled pain   Complete by: As directed    Call MD for:  temperature >100.4   Complete by: As directed    Diet - low sodium heart healthy   Complete by: As directed    Discharge instructions   Complete by: As directed    1) You are strongly advised to  to isolate/quarantine for at least 21 days from the date of your diagnoses with COVID-19 infection--please always wear a mask if you have to go outside the house  2)You will be eligible for COVID-19 vaccination in about 90 days from the date of your monoclonal antibody infusion (90 days from 11/12/19)--- the decision to get the vaccine or Not to get the vaccine will be up to you and your personal physician  3)Avoid ibuprofen/Advil/Aleve/Motrin/Goody Powders/Naproxen/BC powders/Meloxicam/Diclofenac/Indomethacin and other Nonsteroidal anti-inflammatory medications as these will make you more likely to bleed and can cause stomach ulcers, can also cause Kidney problems.   4)Please do NOT  Restart Ramipril until 12/09/2019  5) please maintain adequate hydration/drink fluids especially given your recent episodes of diarrhea--- May use Imodium as needed for excessive diarrhea  6) you advised to have a video/virtual follow-up visit with your primary care physician  within a week for reevaluation and recheck  7) please use flutter valve/incentive spirometry device at least every hour to help your lungs and breathing   Increase activity slowly   Complete by: As directed         Discharge Medications     Allergies as of 11/13/2019      Reactions   Niacin And Related Other (See Comments)   FLUSHING   Penicillins Hives, Itching   Has patient had a PCN reaction causing immediate rash,  facial/tongue/throat swelling, SOB or lightheadedness with hypotension: Unknown Has patient had a PCN reaction causing severe rash involving mucus membranes or skin necrosis: Unknown Has patient had a PCN reaction that required hospitalization: Unknown Has patient had a PCN reaction occurring within the last 10 years: No If all of the above answers are "NO", then may proceed with Cephalosporin use.   Statins Other (See Comments)   MYALGIAS      Medication List    TAKE these medications   albuterol 108 (90 Base) MCG/ACT inhaler Commonly known as: VENTOLIN HFA Inhale 2 puffs into the lungs every 4 (four) hours as needed for wheezing or shortness of breath (cough).   ALPRAZolam 0.5 MG tablet Commonly known as: XANAX Take 1 tablet (0.5 mg total) by mouth 3 (three) times daily as needed (dizziness).   amLODipine 5 MG tablet Commonly known as: NORVASC Take 1 tablet (5 mg total) by mouth daily.   ascorbic acid 500 MG tablet Commonly known as: VITAMIN C Take 1 tablet (500 mg total) by mouth daily. Start taking on: November 14, 2019   dexamethasone 6 MG tablet Commonly known as: DECADRON Take 1 tablet (6 mg total) by mouth daily with breakfast for 5 days.   Evolocumab 140 MG/ML Soaj Commonly known as: Repatha SureClick Inject 932 mg into the skin every 14 (fourteen) days.   guaiFENesin-dextromethorphan 100-10 MG/5ML syrup Commonly known as: ROBITUSSIN DM Take 10 mLs by mouth every 4 (four) hours as needed for cough.   loperamide 2 MG capsule Commonly known  as: IMODIUM Take 1 capsule (2 mg total) by mouth every 12 (twelve) hours as needed for diarrhea or loose stools.   metoprolol succinate 50 MG 24 hr tablet Commonly known as: TOPROL-XL Take 50 mg by mouth daily. Take with or immediately following a meal.   nitroGLYCERIN 0.4 MG SL tablet Commonly known as: NITROSTAT Place 1 tablet (0.4 mg total) under the tongue every 5 (five) minutes as needed for chest pain.   ondansetron 4 MG tablet Commonly known as: ZOFRAN Take 1 tablet (4 mg total) by mouth every 6 (six) hours as needed for nausea.   PROBIOTIC DAILY PO Take 1 capsule by mouth daily. SUPREMA DOPHILUS   Psyllium Husk Powd Take 12 g by mouth daily. INTO 12 OUNCES OF WATER (1 TABLESPOON)   ramipril 10 MG capsule Commonly known as: ALTACE Take 1 capsule (10 mg total) by mouth daily. Please do NOT  Restart Ramipril until 12/09/2019 Start taking on: December 09, 2019 What changed:   additional instructions  These instructions start on December 09, 2019. If you are unsure what to do until then, ask your doctor or other care provider.   zinc sulfate 220 (50 Zn) MG capsule Take 1 capsule (220 mg total) by mouth daily. Start taking on: November 14, 2019       Major procedures and Radiology Reports - PLEASE review detailed and final reports for all details, in brief -  DG Chest Port 1 View  Result Date: 11/12/2019 CLINICAL DATA:  Cough, shortness of breath EXAM: PORTABLE CHEST 1 VIEW COMPARISON:  Jun 14, 2013 FINDINGS: The cardiomediastinal silhouette is unchanged in contour. No pleural effusion. No pneumothorax. Patchy LEFT greater than RIGHT peripheral predominant heterogeneous opacities. Visualized abdomen is unremarkable. Multilevel degenerative changes of the thoracic spine. IMPRESSION: Patchy LEFT greater than RIGHT peripheral predominant heterogeneous opacities. Findings may reflect the sequela of COVID-19 infection versus atelectasis. Electronically Signed   By: Valentino Saxon MD  On: 11/12/2019 12:50    Micro Results  Recent Results (from the past 240 hour(s))  Respiratory Panel by RT PCR (Flu A&B, Covid) - Nasopharyngeal Swab     Status: Abnormal   Collection Time: 11/12/19 12:00 PM   Specimen: Nasopharyngeal Swab  Result Value Ref Range Status   SARS Coronavirus 2 by RT PCR POSITIVE (A) NEGATIVE Final    Comment: RESULT CALLED TO, READ BACK BY AND VERIFIED WITH: ZACH BLEVINS @1418  ON 11/12/19 BY TJ (NOTE) SARS-CoV-2 target nucleic acids are DETECTED.  SARS-CoV-2 RNA is generally detectable in upper respiratory specimens  during the acute phase of infection. Positive results are indicative of the presence of the identified virus, but do not rule out bacterial infection or co-infection with other pathogens not detected by the test. Clinical correlation with patient history and other diagnostic information is necessary to determine patient infection status. The expected result is Negative.  Fact Sheet for Patients:  PinkCheek.be  Fact Sheet for Healthcare Providers: GravelBags.it  This test is not yet approved or cleared by the [place] FDA and  has been authorized for detection and/or diagnosis of SARS-CoV-2 by FDA under an Emergency Use Authorization (EUA).  This EUA will remain in effect (meaning this test can be  used) for the duration of  the COVID-19 declaration under Section 564(b)(1) of the Act, 21 U.S.C. section 360bbb-3(b)(1), unless the authorization is terminated or revoked sooner.      Influenza A by PCR NEGATIVE NEGATIVE Final   Influenza B by PCR NEGATIVE NEGATIVE Final    Comment: (NOTE) The Xpert Xpress SARS-CoV-2/FLU/RSV assay is intended as an aid in  the diagnosis of influenza from Nasopharyngeal swab specimens and  should not be used as a sole basis for treatment. Nasal washings and  aspirates are unacceptable for Xpert Xpress SARS-CoV-2/FLU/RSV    testing.  Fact Sheet for Patients: PinkCheek.be  Fact Sheet for Healthcare Providers: GravelBags.it  This test is not yet approved or cleared by the [place] FDA and  has been authorized for detection and/or diagnosis of SARS-CoV-2 by  FDA under an Emergency Use Authorization (EUA). This EUA will remain  in effect (meaning this test can be used) for the duration of the  Covid-19 declaration under Section 564(b)(1) of the Act, 21  U.S.C. section 360bbb-3(b)(1), unless the authorization is  terminated or revoked. Performed at [facility], [address] [place], [place] [address]   Blood Culture (routine x 2)     Status: None (Preliminary result)   Collection Time: 11/12/19 12:00 PM   Specimen: BLOOD RIGHT ARM  Result Value Ref Range Status   Specimen Description BLOOD RIGHT ARM DRAWN BY RN  Final   Special Requests   Final    BOTTLES DRAWN AEROBIC AND ANAEROBIC Blood Culture adequate volume   Culture   Final    NO GROWTH < 24 HOURS Performed at [facility], [address], [place], [place] [address]    Report Status PENDING  Incomplete  Blood Culture (routine x 2)     Status: None (Preliminary result)   Collection Time: 11/12/19 12:56 PM   Specimen: BLOOD  Result Value Ref Range Status   Specimen Description BLOOD LEFT ANTECUBITAL  Final   Special Requests   Final    BOTTLES DRAWN AEROBIC AND ANAEROBIC Blood Culture adequate volume   Culture   Final    NO GROWTH < 24 HOURS Performed at [facility], [address], [place], [place] [address]  Report Status PENDING  Incomplete     Today   Subjective    [name] today has no new complaints     No fever  Or chills  No vomiting -No significant diarrhea today      Patient has been seen and examined prior to discharge   Objective   Blood pressure 118/80, pulse 92, temperature 98.2 F (36.8 C), temperature source Oral, resp. rate 18, height 5\' 9"   (1.753 m), weight 95 kg, SpO2 95 %.   Intake/Output Summary (Last 24 hours) at 11/13/2019 1248 Last data filed at 11/13/2019 0800 Gross per 24 hour  Intake 2985.37 ml  Output --  Net 2985.37 ml   Exam Gen:- Awake Alert, no acute distress  HEENT:- [place].AT, No sclera icterus Neck-Supple Neck,No JVD,.  Lungs-no wheezing, air movement is fair  CV- S1, S2 normal, regular Abd-  +ve B.Sounds, Abd Soft, No tenderness,    Extremity/Skin:- No  edema,   good pulses Psych-affect is appropriate, oriented x3 Neuro-no new focal deficits, no tremors    Data Review   CBC w Diff:  Lab Results  Component Value Date   WBC 6.7 11/13/2019   HGB 14.2 11/13/2019   HCT 40.9 11/13/2019   PLT 242 11/13/2019   LYMPHOPCT 17 11/13/2019   MONOPCT 14 11/13/2019   EOSPCT 0 11/13/2019   BASOPCT 0 11/13/2019    CMP:  Lab Results  Component Value Date   NA 131 (L) 11/13/2019   NA 143 03/16/2017   K 3.9 11/13/2019   CL 105 11/13/2019   CO2 18 (L) 11/13/2019   BUN 14 11/13/2019   BUN 10 03/16/2017   CREATININE 0.97 11/13/2019   CREATININE 0.72 12/18/2015   PROT 6.8 11/13/2019   PROT 7.2 03/16/2017   ALBUMIN 3.3 (L) 11/13/2019   ALBUMIN 4.1 03/16/2017   BILITOT 0.6 11/13/2019   BILITOT 0.3 03/16/2017   ALKPHOS 55 11/13/2019   AST 42 (H) 11/13/2019   ALT 35 11/13/2019  . Total Discharge time is about 33 minutes  Roxan Hockey M.D on 11/13/2019 at 12:48 PM  Go to [email or web address] -  for contact info  [facility] - Office  [phone]

## 2019-11-13 NOTE — Care Management CC44 (Signed)
Condition Code 44 Documentation Completed  Patient Details  Name: [name] MRN: [id number] Date of Birth: [birth year]   Condition Code 44 given:  Yes Patient signature on Condition Code 44 notice:  Yes Documentation of 2 MD's agreement:  Yes Code 44 added to claim:  Yes    Ihor Gully, LCSW 11/13/2019, 12:28 PM

## 2019-11-13 NOTE — Plan of Care (Signed)

## 2019-11-13 NOTE — Discharge Instructions (Signed)
1) You are strongly advised to  to isolate/quarantine for at least 21 days from the date of your diagnoses with COVID-19 infection--please always wear a mask if you have to go outside the house  2)You will be eligible for COVID-19 vaccination in about 90 days from the date of your monoclonal antibody infusion (90 days from 11/12/19)--- the decision to get the vaccine or Not to get the vaccine will be up to you and your personal physician  3)Avoid ibuprofen/Advil/Aleve/Motrin/Goody Powders/Naproxen/BC powders/Meloxicam/Diclofenac/Indomethacin and other Nonsteroidal anti-inflammatory medications as these will make you more likely to bleed and can cause stomach ulcers, can also cause Kidney problems.   4)Please do NOT  Restart Ramipril until 12/09/2019  5) please maintain adequate hydration/drink fluids especially given your recent episodes of diarrhea--- May use Imodium as needed for excessive diarrhea  6) you advised to have a video/virtual follow-up visit with your primary care physician within a week for reevaluation and recheck  7) please use flutter valve/incentive spirometry device at least every hour to help your lungs and breathing

## 2019-11-18 LAB — CULTURE, BLOOD (ROUTINE X 2)
Culture: NO GROWTH
Culture: NO GROWTH
Special Requests: ADEQUATE
Special Requests: ADEQUATE

## 2019-11-26 ENCOUNTER — Other Ambulatory Visit: Payer: Self-pay

## 2019-11-26 ENCOUNTER — Emergency Department (HOSPITAL_COMMUNITY)
Admission: EM | Admit: 2019-11-26 | Discharge: 2019-11-26 | Disposition: A | Discharge status: Home / Self Care | Payer: Medicare Other | Attending: Emergency Medicine | Admitting: Emergency Medicine

## 2019-11-26 ENCOUNTER — Emergency Department (HOSPITAL_COMMUNITY): Payer: Medicare Other

## 2019-11-26 ENCOUNTER — Encounter (HOSPITAL_COMMUNITY): Payer: Self-pay | Admitting: *Deleted

## 2019-11-26 DIAGNOSIS — I82611 Acute embolism and thrombosis of superficial veins of right upper extremity: Secondary | ICD-10-CM | POA: Diagnosis not present

## 2019-11-26 DIAGNOSIS — I10 Essential (primary) hypertension: Secondary | ICD-10-CM | POA: Insufficient documentation

## 2019-11-26 DIAGNOSIS — I82811 Embolism and thrombosis of superficial veins of right lower extremities: Secondary | ICD-10-CM | POA: Diagnosis not present

## 2019-11-26 DIAGNOSIS — U071 COVID-19: Secondary | ICD-10-CM | POA: Diagnosis not present

## 2019-11-26 DIAGNOSIS — Z79899 Other long term (current) drug therapy: Secondary | ICD-10-CM | POA: Diagnosis not present

## 2019-11-26 DIAGNOSIS — Z87891 Personal history of nicotine dependence: Secondary | ICD-10-CM | POA: Diagnosis not present

## 2019-11-26 DIAGNOSIS — M7989 Other specified soft tissue disorders: Secondary | ICD-10-CM | POA: Diagnosis not present

## 2019-11-26 DIAGNOSIS — I2510 Atherosclerotic heart disease of native coronary artery without angina pectoris: Secondary | ICD-10-CM | POA: Diagnosis not present

## 2019-11-26 DIAGNOSIS — Z8616 Personal history of COVID-19: Secondary | ICD-10-CM | POA: Insufficient documentation

## 2019-11-26 DIAGNOSIS — M79601 Pain in right arm: Secondary | ICD-10-CM | POA: Diagnosis not present

## 2019-11-26 DIAGNOSIS — I82601 Acute embolism and thrombosis of unspecified veins of right upper extremity: Secondary | ICD-10-CM | POA: Diagnosis not present

## 2019-11-26 MED ORDER — NAPROXEN 250 MG PO TABS
375.0000 mg | ORAL_TABLET | Freq: Once | ORAL | Status: AC
  Administered 2019-11-26: 375 mg via ORAL
  Filled 2019-11-26: qty 2

## 2019-11-26 MED ORDER — NAPROXEN 375 MG PO TABS: 375.0000 mg | ORAL_TABLET | Freq: Two times a day (BID) | ORAL | 0 refills | Status: DC

## 2019-11-26 NOTE — Discharge Instructions (Addendum)
The ultrasound of your right arm did not show evidence of a blood clot within the deep veins. You do have a small clot in the superficial veins of your arm. These type of clots are not serious and usually resolve on their own by elevating your arm and applying warm heat on and off. The medication prescribed should help with the discomfort. This will likely resolve in a week or so. Follow-up with your primary doctor for recheck.

## 2019-11-26 NOTE — ED Triage Notes (Signed)
Pt had blood drawn here on 10/5, but for the past 3-4 days pt has been c/o swelling and pain to right arm.  Swelling from elbow to hand.  Pt states PCP sent him here to r/o blood clot.

## 2019-11-28 NOTE — ED Provider Notes (Signed)
[facility] EMERGENCY DEPARTMENT Provider Note   CSN: [id number] Arrival date & time: 11/26/19  1314     History Chief Complaint  Patient presents with  . Arm Pain    [name] is a 69 y.o. male.  HPI      [name] is a 69 y.o. male who presents to the Emergency Department complaining of pain and swelling of his mid forearm with focal pain of his right elbow and right proximal thumb.  Patient states he was diagnosed with COVID-19 and admitted to the hospital on 11/12/2019 and discharged the following day.  He had an IV placed in his right arm.  He noticed mild pain and swelling of his right forearm 2 days ago.  He states the thumb and elbow pain are associated with movement.  Denies known injury.  He reports intermittent tingling of his fingers of both hands, but denies numbness.  He had a video visit with his PCP today and was advised to come to the emergency department for evaluation for a possible blood clot of his arm.  He denies chest pain, shortness of breath, and history of DVTs or PE.  Past Medical History:  Diagnosis Date  . Anxiety   . Articular cartilage disorder involving shoulder region, left 11/2016  . Bursitis of shoulder, left 11/2016  . Complication of anesthesia    hx. of heart rate dropping during 2 surgeries   (2004- back surgery and with Lithotrispy )  . Crohn's disease ([place])   . Depression   . Dyspnea    with exertion  . History of hepatitis    unknown type - 1970s  . History of kidney stones   . Hyperlipidemia    unable to tolerat statins  . Hypertension    states under control with meds., has been on med. since 1994  . Immature cataract of both eyes   . Limited joint range of motion    cervical spine  . Myocardial infarct ([place]) 09/13/1992  . Nonobstructive atherosclerosis of coronary artery   . Numbness in both hands   . Obesity   . Osteoarthritis 11/2016   left shoulder  . Rotator cuff tear, left 11/2016  . Sleep apnea    uses  BiPAP, but having issues with mask not sealing   . Thyroid nodule     Patient Active Problem List   Diagnosis Date Noted  . Pneumonia due to COVID-19 virus 11/12/2019  . Cervical spinal stenosis 11/12/2015  . Crohn disease ([place]) 01/20/2015  . Rectal fistula 01/20/2015  . Noninfectious gastroenteritis, unspecified   . Rectal bleeding 10/21/2014  . Bowel habit changes 10/21/2014  . Obesity (BMI 30.0-34.9) 05/18/2014  . OSA (obstructive sleep apnea) 03/30/2013  . Chest pain 02/15/2013  . HTN (hypertension) 02/15/2013  . CAD (coronary artery disease) 02/15/2013  . Dyslipidemia 02/15/2013  . HAND, ARTHRITIS, DEGEN./OSTEO 12/01/2008  . TRIGGER FINGER 12/01/2008    Past Surgical History:  Procedure Laterality Date  . ANAL FISTULOTOMY N/A 06/28/2013   Procedure: ANAL FISTULOTOMY;  Surgeon: [name], MD;  Location: [facility];  Service: General;  Laterality: N/A;  . ANAL FISTULOTOMY  2017  . ANTERIOR CERVICAL DECOMP/DISCECTOMY FUSION N/A 11/12/2015   Procedure: ANTERIOR CERVICAL DECOMPRESSION/DISCECTOMY FUSION CERVICAL THREE- CERVICAL FOUR, CERVICAL FOUR- CERVICAL FIVE;  Surgeon: Jovita Gamma, MD;  Location: [place];  Service: Neurosurgery;  Laterality: N/A;  ANTERIOR CERVICAL DECOMPRESSION/DISCECTOMY FUSION C3-C4, C4-C5  . BIOPSY THYROID    . [place],  12/25/2007   a. PTCA alone in 1994 b. RCA CTO w/ L-R collaterals, 40-50% prox RCA, 20% prox LAD; EF 50-55%, inferoapical HK  . CARDIAC CATHETERIZATION  04/02/2001  . CARDIAC CATHETERIZATION  11/07/2000  . CARDIAC CATHETERIZATION  09/24/1996  . CARPAL TUNNEL RELEASE Left 10/09/2014   Procedure: LEFT CARPAL TUNNEL RELEASE;  Surgeon: [name], MD;  Location: [place];  Service: Orthopedics;  Laterality: Left;  . CARPAL TUNNEL RELEASE Right 08/28/2003  . CAUTERY OF TURBINATES  11/05/2003  . COLONOSCOPY N/A 11/06/2014   RMR: Skipped Colonic ulcerations with significant ileocecal valve involvement and  rectal sparing most consisitant with Crohns disease. Minimal non-steroidal drug use. Probable colonic lipoma  . CYSTECTOMY    . INCISION AND DRAINAGE ABSCESS Right 05/23/2012   Procedure: INCISION AND DRAINAGE ABSCESS;  Surgeon: [name], MD;  Location: [facility];  Service: General;  Laterality: Right;  . KNEE ARTHROSCOPY    . LITHOTRIPSY    . LUMBAR FUSION  02/18/2002  . LUMBAR LAMINECTOMY  02/18/2002   L4-5  . NASAL SEPTUM SURGERY  11/05/2003  . PARTIAL NEPHRECTOMY Left    age 79  . SHOULDER ARTHROSCOPY WITH SUBACROMIAL DECOMPRESSION, ROTATOR CUFF REPAIR AND BICEP TENDON REPAIR Left 11/30/2016   Procedure: LEFT SHOULDER ARTHROSCOPY, DEBRIDEMENT, ACROMIOPLASTY WITH DISTAL CLAVICAL EXCISION, POSSIBLE ROTATOR CUFF REPAIR AND BICEP TENODESIS;  Surgeon: Ninetta Lights, MD;  Location: [facility];  Service: Orthopedics;  Laterality: Left;  . SPLENECTOMY     age 11  . TRANSTHORACIC ECHOCARDIOGRAM  03/05/2009   EF >55%, normal LV wall thickness.       Family History  Problem Relation Age of Onset  . CAD Father        CABG   . CAD Brother        CABG in 2s  . CAD Brother   . Arthritis/Rheumatoid Mother   . Hyperlipidemia Mother   . Thrombocytopenia Mother   . COPD Son        smoker  . Alzheimer's disease Maternal Grandmother   . Lung disease Paternal Grandfather     Social History   Tobacco Use  . Smoking status: Former Smoker    Packs/day: 0.00    Years: 18.00    Pack years: 0.00    Quit date: 02/06/1993    Years since quitting: 26.8  . Smokeless tobacco: Former Network engineer  . Vaping Use: Never used  Substance Use Topics  . Alcohol use: No    Alcohol/week: 0.0 standard drinks  . Drug use: No    Home Medications Prior to Admission medications   Medication Sig Start Date End Date Taking? Authorizing Provider  albuterol (VENTOLIN HFA) 108 (90 Base) MCG/ACT inhaler Inhale 2 puffs into the lungs every 4 (four) hours as needed for wheezing or shortness of breath  (cough). 11/13/19   Roxan Hockey, MD  ALPRAZolam Duanne Moron) 0.5 MG tablet Take 1 tablet (0.5 mg total) by mouth 3 (three) times daily as needed (dizziness). Patient not taking: Reported on 11/12/2019 12/31/16   Evalee Jefferson, PA-C  amLODipine (NORVASC) 5 MG tablet Take 1 tablet (5 mg total) by mouth daily. 03/28/13   Troy Sine, MD  ascorbic acid (VITAMIN C) 500 MG tablet Take 1 tablet (500 mg total) by mouth daily. 11/14/19   Roxan Hockey, MD  Evolocumab (REPATHA SURECLICK) 267 MG/ML SOAJ Inject 140 mg into the skin every 14 (fourteen) days. Patient not taking: Reported on 11/12/2019 02/12/18   Shelva Majestic  A, MD  guaiFENesin-dextromethorphan (ROBITUSSIN DM) 100-10 MG/5ML syrup Take 10 mLs by mouth every 4 (four) hours as needed for cough. 11/13/19   Roxan Hockey, MD  loperamide (IMODIUM) 2 MG capsule Take 1 capsule (2 mg total) by mouth every 12 (twelve) hours as needed for diarrhea or loose stools. 11/13/19   Roxan Hockey, MD  metoprolol succinate (TOPROL-XL) 50 MG 24 hr tablet Take 50 mg by mouth daily. Take with or immediately following a meal.    [provider]  naproxen (NAPROSYN) 375 MG tablet Take 1 tablet (375 mg total) by mouth 2 (two) times daily. Take with food 11/26/19   Raymonde Hamblin, PA-C  nitroGLYCERIN (NITROSTAT) 0.4 MG SL tablet Place 1 tablet (0.4 mg total) under the tongue every 5 (five) minutes as needed for chest pain. 01/12/17   Troy Sine, MD  ondansetron (ZOFRAN) 4 MG tablet Take 1 tablet (4 mg total) by mouth every 6 (six) hours as needed for nausea. 11/13/19   Roxan Hockey, MD  Probiotic Product (PROBIOTIC DAILY PO) Take 1 capsule by mouth daily. [place]    [provider]  Psyllium Husk POWD Take 12 g by mouth daily. INTO 12 OUNCES OF WATER (1 TABLESPOON)    [provider]  ramipril (ALTACE) 10 MG capsule Take 1 capsule (10 mg total) by mouth daily. Please do NOT  Restart Ramipril until 12/09/2019 12/09/19   Roxan Hockey, MD  zinc sulfate 220 (50 Zn) MG capsule Take 1 capsule (220 mg total) by mouth daily. 11/14/19   Roxan Hockey, MD    Allergies    Niacin and related, Penicillins, and Statins  Review of Systems   Review of Systems  Review of Systems  Constitutional:  Negative for chills, appetite change and fever.  Respiratory: Negative for chest tightness and shortness of breath.   Cardiovascular: Negative for chest pain.  Gastrointestinal:  Negative for abdominal pain, nausea, vomiting, abdominal distention, and diarrhea.  Genitourinary: Negative for decreased urine volume, dysuria and flank pain.  Musculoskeletal: Positive for pain to right thumb, elbow and swelling to forearm.  Skin: Negative for color change and rash.  Neurological: Negative for dizziness, weakness and numbness.  Hematological: Negative for adenopathy.   Physical Exam Updated Vital Signs BP 130/78 (BP Location: Left Arm)   Pulse 84   Temp 98 F (36.7 C) (Oral)   Resp 18   Ht 5\' 9"  (1.753 m)   Wt 90.7 kg   SpO2 99%   BMI 29.53 kg/m   Physical Exam Physical Exam Vitals and nursing note reviewed.  Constitutional:      General: He is not in acute distress.    Appearance: Normal appearance. He is well-developed. He is not toxic-appearing or diaphoretic.  HENT:     Head: Normocephalic and atraumatic.      Cardiovascular:     Rate and Rhythm: Normal rate and regular rhythm.     Pulses: Normal pulses. Radial pulses are strong and palpable bilaterally. Pulmonary:     Effort: Pulmonary effort is normal. No respiratory distress.     Breath sounds: Normal breath sounds.  Abdominal:       Palpations: Abdomen is soft. non tender Musculoskeletal:        General: Normal range of motion. Focal ttp of the proximal right thumb.  Pain with extension, no edema, erythema of the joint.  Mild edema of mid right forearm.  No erythema or excessive warmth.  Pt has FROM of the right elbow  without difficulty, no edema     Right lower leg: No edema.     Left lower leg: No edema.  Skin:    General: Skin is warm.     Capillary Refill: Capillary refill takes less than 2 seconds.     Findings: No rash.  Neurological:     General: No focal deficit present.     Mental Status: He is alert.     Motor: No weakness or abnormal muscle tone.     Coordination: Coordination normal.     ED Results / Procedures / Treatments   Labs (all labs ordered are listed, but only abnormal results are displayed) Labs Reviewed - No data to display  EKG None  Radiology US Venous Img Upper Right (DVT Study)  Result Date: 11/26/2019 CLINICAL DATA:  Right arm pain and swelling EXAM: RIGHT UPPER EXTREMITY VENOUS DOPPLER ULTRASOUND TECHNIQUE: Gray-scale sonography with graded compression, as well as color Doppler and duplex ultrasound were performed to evaluate the upper extremity deep venous system from the level of the subclavian vein and including the jugular, axillary, basilic, radial, ulnar and upper cephalic vein. Spectral Doppler was utilized to evaluate flow at rest and with distal augmentation maneuvers. COMPARISON:  None. FINDINGS: Contralateral Subclavian Vein: Respiratory phasicity is normal and symmetric with the symptomatic side. No evidence of thrombus. Normal compressibility. Internal Jugular Vein: No evidence of thrombus. Normal compressibility, respiratory phasicity and response to augmentation. Subclavian Vein: No evidence of thrombus. Normal compressibility, respiratory phasicity and response to augmentation. Axillary Vein: No evidence of thrombus. Normal compressibility, respiratory phasicity and response to augmentation. Cephalic Vein: Thrombus is noted with decreased compressibility. Basilic Vein: Thrombus is noted with decreased compressibility. Brachial Veins: No evidence of thrombus. Normal compressibility, respiratory phasicity and response to augmentation. Radial Veins: No evidence of thrombus. Normal  compressibility, respiratory phasicity and response to augmentation. Ulnar Veins: No evidence of thrombus. Normal compressibility, respiratory phasicity and response to augmentation. Venous Reflux:  None visualized. Other Findings:  None visualized. IMPRESSION: No evidence of DVT within the right upper extremity. Superficial venous thrombus is noted within the cephalic and basilic veins. Electronically Signed   By: Inez Catalina M.D.   On: 11/26/2019 15:06    Procedures Procedures (including critical care time)  Medications Ordered in ED Medications  naproxen (NAPROSYN) tablet 375 mg (375 mg Oral Given 11/26/19 2223)    ED Course  I have reviewed the triage vital signs and the nursing notes.  Pertinent labs & imaging results that were available during my care of the patient were reviewed by me and considered in my medical decision making (see chart for details).    MDM Rules/Calculators/A&P                          Pt with discomfort of the right thumb, forearm and elbow.  Motor and sensation intact.  Strong, symmetrical radial pulses.  Skin warm.  No erythema.  No concerning sx's for septic joint.  I feel that sx's of the thumb are arthritic.  Given pt's recent IV access of the forearm, [place] was ordered.    US shows superficial venous thrombus, no DVT.  Discussed findings with pt, he agrees to elevate, warm compresses, BMP from 11/13/19 showed nml creatinine, so short course of NSAID prescribed.  Pt agrees to close out pt f/u.  Return precautions discussed.   Final Clinical Impression(s) / ED Diagnoses Final diagnoses:  Superficial venous thrombosis of right upper extremity  Rx / DC Orders ED Discharge Orders         Ordered    naproxen (NAPROSYN) 375 MG tablet  2 times daily        11/26/19 2213           Kem Parkinson, PA-C 11/28/19 1024    Long, [place], MD 11/28/19 1940

## 2019-12-07 ENCOUNTER — Encounter (HOSPITAL_COMMUNITY): Payer: Self-pay

## 2019-12-07 ENCOUNTER — Other Ambulatory Visit: Payer: Self-pay

## 2019-12-07 ENCOUNTER — Emergency Department (HOSPITAL_COMMUNITY): Payer: Medicare Other

## 2019-12-07 ENCOUNTER — Emergency Department (HOSPITAL_COMMUNITY)
Admission: EM | Admit: 2019-12-07 | Discharge: 2019-12-07 | Disposition: A | Discharge status: Home / Self Care | Payer: Medicare Other | Attending: Emergency Medicine | Admitting: Emergency Medicine

## 2019-12-07 DIAGNOSIS — J90 Pleural effusion, not elsewhere classified: Secondary | ICD-10-CM | POA: Diagnosis not present

## 2019-12-07 DIAGNOSIS — Z79899 Other long term (current) drug therapy: Secondary | ICD-10-CM | POA: Insufficient documentation

## 2019-12-07 DIAGNOSIS — R6884 Jaw pain: Secondary | ICD-10-CM | POA: Diagnosis not present

## 2019-12-07 DIAGNOSIS — R0789 Other chest pain: Secondary | ICD-10-CM | POA: Diagnosis not present

## 2019-12-07 DIAGNOSIS — I160 Hypertensive urgency: Secondary | ICD-10-CM | POA: Insufficient documentation

## 2019-12-07 DIAGNOSIS — I10 Essential (primary) hypertension: Secondary | ICD-10-CM | POA: Diagnosis not present

## 2019-12-07 DIAGNOSIS — Z8616 Personal history of COVID-19: Secondary | ICD-10-CM | POA: Insufficient documentation

## 2019-12-07 DIAGNOSIS — E7849 Other hyperlipidemia: Secondary | ICD-10-CM | POA: Diagnosis not present

## 2019-12-07 DIAGNOSIS — R079 Chest pain, unspecified: Secondary | ICD-10-CM

## 2019-12-07 DIAGNOSIS — Z87891 Personal history of nicotine dependence: Secondary | ICD-10-CM | POA: Insufficient documentation

## 2019-12-07 DIAGNOSIS — I2510 Atherosclerotic heart disease of native coronary artery without angina pectoris: Secondary | ICD-10-CM | POA: Insufficient documentation

## 2019-12-07 LAB — BASIC METABOLIC PANEL
Anion gap: 10 (ref 5–15)
BUN: 5 mg/dL — ABNORMAL LOW (ref 8–23)
CO2: 24 mmol/L (ref 22–32)
Calcium: 8.7 mg/dL — ABNORMAL LOW (ref 8.9–10.3)
Chloride: 104 mmol/L (ref 98–111)
Creatinine, Ser: 0.69 mg/dL (ref 0.61–1.24)
GFR, Estimated: >60 mL/min (ref >60)
Glucose, Bld: 99 mg/dL (ref 70–99)
Potassium: 3.1 mmol/L — ABNORMAL LOW (ref 3.5–5.1)
Sodium: 138 mmol/L (ref 135–145)

## 2019-12-07 LAB — CBC WITH DIFFERENTIAL/PLATELET
Abs Immature Granulocytes: 0.02 10*3/uL (ref 0.00–0.07)
Basophils Absolute: 0 10*3/uL (ref 0.0–0.1)
Basophils Relative: 1 %
Eosinophils Absolute: 0.3 10*3/uL (ref 0.0–0.5)
Eosinophils Relative: 5 %
HCT: 40.6 % (ref 39.0–52.0)
Hemoglobin: 13.8 g/dL (ref 13.0–17.0)
Immature Granulocytes: 0 %
Lymphocytes Relative: 22 %
Lymphs Abs: 1.2 10*3/uL (ref 0.7–4.0)
MCH: 31.1 pg (ref 26.0–34.0)
MCHC: 34 g/dL (ref 30.0–36.0)
MCV: 91.4 fL (ref 80.0–100.0)
Monocytes Absolute: 0.7 10*3/uL (ref 0.1–1.0)
Monocytes Relative: 11 %
Neutro Abs: 3.5 10*3/uL (ref 1.7–7.7)
Neutrophils Relative %: 61 %
Platelets: 507 10*3/uL — ABNORMAL HIGH (ref 150–400)
RBC: 4.44 MIL/uL (ref 4.22–5.81)
RDW: 13.7 % (ref 11.5–15.5)
WBC: 5.8 10*3/uL (ref 4.0–10.5)
nRBC: 0 % (ref 0.0–0.2)

## 2019-12-07 LAB — TROPONIN I (HIGH SENSITIVITY)
Troponin I (High Sensitivity): 6 ng/L (ref <18)
Troponin I (High Sensitivity): 6 ng/L (ref <18)

## 2019-12-07 MED ORDER — NITROGLYCERIN 0.4 MG SL SUBL: 0.4000 mg | SUBLINGUAL_TABLET | SUBLINGUAL | Status: DC | PRN

## 2019-12-07 MED ORDER — POTASSIUM CHLORIDE CRYS ER 20 MEQ PO TBCR
40.0000 meq | EXTENDED_RELEASE_TABLET | Freq: Once | ORAL | Status: AC
  Administered 2019-12-07: 40 meq via ORAL
  Filled 2019-12-07: qty 2

## 2019-12-07 MED ORDER — ASPIRIN 81 MG PO CHEW
324.0000 mg | CHEWABLE_TABLET | Freq: Once | ORAL | Status: AC
  Administered 2019-12-07: 324 mg via ORAL
  Filled 2019-12-07: qty 4

## 2019-12-07 NOTE — ED Triage Notes (Signed)
Pt reports that his blood pressure was this morning 169/100. Pt reports left arm pain begins in shoulder and goes down. Feeling numb. Jaw pain Pt reports his wife just died and he just buried her. He had covid 11/12/19

## 2019-12-07 NOTE — Discharge Instructions (Addendum)
If you develop recurrent, continued, or worsening chest pain, shortness of breath, fever, vomiting, abdominal or back pain, or any other new/concerning symptoms then return to the ER for evaluation.  

## 2019-12-07 NOTE — ED Provider Notes (Signed)
[facility] EMERGENCY DEPARTMENT Provider Note   CSN: [id number] Arrival date & time: 12/07/19  0715     History Chief Complaint  Patient presents with   Arm Pain   Hypertension    [name] is a 69 y.o. male.  HPI 70 year old male presents with concern for hypertension and jaw pain.  Jaw has been hurting on and off since yesterday.  His wife just recently passed away and his other family left his house last night so this is the first time he is been alone in a while.  He notes some on and off sharp chest pain but it is mild.  His left arm has also been hurting though this is a chronic problem and has got some arthritis in his hand.  The jaw seems to hurt when he moves it or swallows.  No dental pain.  The jaw somewhat reminds him of previous heart pain.  Blood pressure was running high yesterday in the 150s and today was 428 systolic. He wonders if it could be stress.   Past Medical History:  Diagnosis Date   Anxiety    Articular cartilage disorder involving shoulder region, left 11/2016   Bursitis of shoulder, left 76/8115   Complication of anesthesia    hx. of heart rate dropping during 2 surgeries   (2004- back surgery and with Lithotrispy )   Crohn's disease ([place])    Depression    Dyspnea    with exertion   History of hepatitis    unknown type - 1970s   History of kidney stones    Hyperlipidemia    unable to tolerat statins   Hypertension    states under control with meds., has been on med. since 1994   Immature cataract of both eyes    Limited joint range of motion    cervical spine   Myocardial infarct ([place]) 09/13/1992   Nonobstructive atherosclerosis of coronary artery    Numbness in both hands    Obesity    Osteoarthritis 11/2016   left shoulder   Rotator cuff tear, left 11/2016   Sleep apnea    uses BiPAP, but having issues with mask not sealing    Thyroid nodule     Patient Active Problem List   Diagnosis Date Noted    Pneumonia due to COVID-19 virus 11/12/2019   Cervical spinal stenosis 11/12/2015   Crohn disease ([place]) 01/20/2015   Rectal fistula 01/20/2015   Noninfectious gastroenteritis, unspecified    Rectal bleeding 10/21/2014   Bowel habit changes 10/21/2014   Obesity (BMI 30.0-34.9) 05/18/2014   OSA (obstructive sleep apnea) 03/30/2013   Chest pain 02/15/2013   HTN (hypertension) 02/15/2013   CAD (coronary artery disease) 02/15/2013   Dyslipidemia 02/15/2013   HAND, ARTHRITIS, DEGEN./OSTEO 12/01/2008   TRIGGER FINGER 12/01/2008    Past Surgical History:  Procedure Laterality Date   ANAL FISTULOTOMY N/A 06/28/2013   Procedure: ANAL FISTULOTOMY;  Surgeon: [name], MD;  Location: [facility];  Service: General;  Laterality: N/A;   ANAL FISTULOTOMY  2017   ANTERIOR CERVICAL DECOMP/DISCECTOMY FUSION N/A 11/12/2015   Procedure: ANTERIOR CERVICAL DECOMPRESSION/DISCECTOMY FUSION CERVICAL THREE- CERVICAL FOUR, CERVICAL FOUR- CERVICAL FIVE;  Surgeon: Jovita Gamma, MD;  Location: [place];  Service: Neurosurgery;  Laterality: N/A;  ANTERIOR CERVICAL DECOMPRESSION/DISCECTOMY FUSION C3-C4, C4-C5   BIOPSY THYROID     CARDIAC CATHETERIZATION  1994, 12/25/2007   a. PTCA alone in 1994 b. RCA CTO w/ L-R collaterals, 40-50% prox RCA, 20% prox  LAD; EF 50-55%, inferoapical HK   CARDIAC CATHETERIZATION  04/02/2001   CARDIAC CATHETERIZATION  11/07/2000   CARDIAC CATHETERIZATION  09/24/1996   CARPAL TUNNEL RELEASE Left 10/09/2014   Procedure: LEFT CARPAL TUNNEL RELEASE;  Surgeon: [name], MD;  Location: [place];  Service: Orthopedics;  Laterality: Left;   CARPAL TUNNEL RELEASE Right 08/28/2003   CAUTERY OF TURBINATES  11/05/2003   COLONOSCOPY N/A 11/06/2014   RMR: Skipped Colonic ulcerations with significant ileocecal valve involvement and rectal sparing most consisitant with Crohns disease. Minimal non-steroidal drug use. Probable colonic lipoma   CYSTECTOMY      INCISION AND DRAINAGE ABSCESS Right 05/23/2012   Procedure: INCISION AND DRAINAGE ABSCESS;  Surgeon: [name], MD;  Location: [facility];  Service: General;  Laterality: Right;   KNEE ARTHROSCOPY     LITHOTRIPSY     LUMBAR FUSION  02/18/2002   LUMBAR LAMINECTOMY  02/18/2002   L4-5   NASAL SEPTUM SURGERY  11/05/2003   PARTIAL NEPHRECTOMY Left    age 47   SHOULDER ARTHROSCOPY WITH SUBACROMIAL DECOMPRESSION, ROTATOR CUFF REPAIR AND BICEP TENDON REPAIR Left 11/30/2016   Procedure: LEFT SHOULDER ARTHROSCOPY, DEBRIDEMENT, ACROMIOPLASTY WITH DISTAL CLAVICAL EXCISION, POSSIBLE ROTATOR CUFF REPAIR AND BICEP TENODESIS;  Surgeon: Ninetta Lights, MD;  Location: [place];  Service: Orthopedics;  Laterality: Left;   SPLENECTOMY     age 79   TRANSTHORACIC ECHOCARDIOGRAM  03/05/2009   EF >55%, normal LV wall thickness.       Family History  Problem Relation Age of Onset   CAD Father        CABG    CAD Brother        CABG in 35s   CAD Brother    Arthritis/Rheumatoid Mother    Hyperlipidemia Mother    Thrombocytopenia Mother    COPD Son        smoker   Alzheimer's disease Maternal Grandmother    Lung disease Paternal Grandfather     Social History   Tobacco Use   Smoking status: Former Smoker    Packs/day: 0.00    Years: 18.00    Pack years: 0.00    Quit date: 02/06/1993    Years since quitting: 26.8   Smokeless tobacco: Former Counsellor Use: Never used  Substance Use Topics   Alcohol use: No    Alcohol/week: 0.0 standard drinks   Drug use: No    Home Medications Prior to Admission medications   Medication Sig Start Date End Date Taking? Authorizing Provider  albuterol (VENTOLIN HFA) 108 (90 Base) MCG/ACT inhaler Inhale 2 puffs into the lungs every 4 (four) hours as needed for wheezing or shortness of breath (cough). 11/13/19   Roxan Hockey, MD  ALPRAZolam Duanne Moron) 0.5 MG tablet Take 1 tablet (0.5 mg total) by mouth 3 (three) times daily  as needed (dizziness). Patient not taking: Reported on 11/12/2019 12/31/16   Evalee Jefferson, PA-C  amLODipine (NORVASC) 5 MG tablet Take 1 tablet (5 mg total) by mouth daily. 03/28/13   Troy Sine, MD  ascorbic acid (VITAMIN C) 500 MG tablet Take 1 tablet (500 mg total) by mouth daily. 11/14/19   Roxan Hockey, MD  Evolocumab (REPATHA SURECLICK) 161 MG/ML SOAJ Inject 140 mg into the skin every 14 (fourteen) days. Patient not taking: Reported on 11/12/2019 02/12/18   Troy Sine, MD  guaiFENesin-dextromethorphan [facility] DM) 100-10 MG/5ML syrup Take 10 mLs by mouth every 4 (four) hours as  needed for cough. 11/13/19   Roxan Hockey, MD  loperamide (IMODIUM) 2 MG capsule Take 1 capsule (2 mg total) by mouth every 12 (twelve) hours as needed for diarrhea or loose stools. 11/13/19   Roxan Hockey, MD  metoprolol succinate (TOPROL-XL) 50 MG 24 hr tablet Take 50 mg by mouth daily. Take with or immediately following a meal.    [provider]  naproxen (NAPROSYN) 375 MG tablet Take 1 tablet (375 mg total) by mouth 2 (two) times daily. Take with food 11/26/19   Triplett, Tammy, PA-C  nitroGLYCERIN (NITROSTAT) 0.4 MG SL tablet Place 1 tablet (0.4 mg total) under the tongue every 5 (five) minutes as needed for chest pain. 01/12/17   Troy Sine, MD  ondansetron (ZOFRAN) 4 MG tablet Take 1 tablet (4 mg total) by mouth every 6 (six) hours as needed for nausea. 11/13/19   Roxan Hockey, MD  Probiotic Product (PROBIOTIC DAILY PO) Take 1 capsule by mouth daily. [place]    [provider]  Psyllium Husk POWD Take 12 g by mouth daily. INTO 12 OUNCES OF WATER (1 TABLESPOON)    [provider]  ramipril (ALTACE) 10 MG capsule Take 1 capsule (10 mg total) by mouth daily. Please do NOT  Restart Ramipril until 12/09/2019 12/09/19   Roxan Hockey, MD  zinc sulfate 220 (50 Zn) MG capsule Take 1 capsule (220 mg total) by mouth daily. 11/14/19   Roxan Hockey, MD     Allergies    Niacin and related, Penicillins, and Statins  Review of Systems   Review of Systems  Respiratory: Negative for shortness of breath.   Cardiovascular: Positive for chest pain.  All other systems reviewed and are negative.   Physical Exam Updated Vital Signs BP 128/85    Pulse 79    Temp 98.3 F (36.8 C) (Oral)    Resp 13    Ht 5\' 9"  (1.753 m)    Wt 90 kg    SpO2 96%    BMI 29.30 kg/m   Physical Exam Vitals and nursing note reviewed.  Constitutional:      General: He is not in acute distress.    Appearance: He is well-developed. He is not ill-appearing or diaphoretic.  HENT:     Head: Normocephalic and atraumatic.     Right Ear: External ear normal.     Left Ear: External ear normal.     Nose: Nose normal.     Mouth/Throat:     Comments: No significant jaw tenderness No acute dental abnormalities, tenderness or oral swelling Eyes:     General:        Right eye: No discharge.        Left eye: No discharge.  Cardiovascular:     Rate and Rhythm: Normal rate and regular rhythm.     Pulses:          Radial pulses are 2+ on the left side.     Heart sounds: Normal heart sounds.  Pulmonary:     Effort: Pulmonary effort is normal.     Breath sounds: Normal breath sounds. No wheezing, rhonchi or rales.  Abdominal:     Palpations: Abdomen is soft.     Tenderness: There is no abdominal tenderness.  Musculoskeletal:     Cervical back: Neck supple.  Skin:    General: Skin is warm and dry.  Neurological:     Mental Status: He is alert.  Psychiatric:  Mood and Affect: Mood is not anxious.     ED Results / Procedures / Treatments   Labs (all labs ordered are listed, but only abnormal results are displayed) Labs Reviewed  BASIC METABOLIC PANEL - Abnormal; Notable for the following components:      Result Value   Potassium 3.1 (*)    BUN 5 (*)    Calcium 8.7 (*)    All other components within normal limits  CBC WITH DIFFERENTIAL/PLATELET -  Abnormal; Notable for the following components:   Platelets 507 (*)    All other components within normal limits  TROPONIN I (HIGH SENSITIVITY)  TROPONIN I (HIGH SENSITIVITY)    EKG EKG Interpretation  Date/Time:  Saturday December 07 2019 08:50:20 EDT Ventricular Rate:  75 PR Interval:    QRS Duration: 106 QT Interval:  387 QTC Calculation: 433 R Axis:   72 Text Interpretation: Sinus rhythm Borderline prolonged PR interval Abnormal inferior Q waves no significant change since earlier in the day or Nov 12 2019 Confirmed by Sherwood Gambler [phone]) on 12/07/2019 10:26:20 AM   Radiology DG Chest 2 View  Result Date: 12/07/2019 CLINICAL DATA:  Chest and left arm pain. EXAM: CHEST - 2 VIEW COMPARISON:  11/12/2019 FINDINGS: The cardiac silhouette, mediastinal and hilar contours are within normal limits and stable. The lungs are clear of an acute process. No pleural effusions or pulmonary lesions. The bony thorax is intact. IMPRESSION: No acute cardiopulmonary findings. Electronically Signed   By: Marijo Sanes M.D.   On: 12/07/2019 09:01    Procedures Procedures (including critical care time)  Medications Ordered in ED Medications  nitroGLYCERIN (NITROSTAT) SL tablet 0.4 mg (has no administration in time range)  aspirin chewable tablet 324 mg (324 mg Oral Given 12/07/19 0847)  potassium chloride SA (KLOR-CON) CR tablet 40 mEq (40 mEq Oral Given 12/07/19 0919)    ED Course  I have reviewed the triage vital signs and the nursing notes.  Pertinent labs & imaging results that were available during my care of the patient were reviewed by me and considered in my medical decision making (see chart for details).    MDM Rules/Calculators/A&P                          Patient's chest pain is atypical.  His jaw pain may or may not be cardiac related but I do wonder if stress is playing a role.  He was found to be mildly hypokalemic and this was repleted with oral potassium.  Troponins were  obtained x2 and are negative.  ECG without acute ischemia.  Discussed with patient, he feels well enough to go home and follow-up with his outpatient cardiologist.  I think this is pretty reasonable.  I highly doubt ACS, PE, dissection. Final Clinical Impression(s) / ED Diagnoses Final diagnoses:  Nonspecific chest pain  Hypertensive urgency    Rx / DC Orders ED Discharge Orders    None       Sherwood Gambler, MD 12/07/19 1053

## 2019-12-07 NOTE — ED Notes (Addendum)
Pt to xray

## 2019-12-13 ENCOUNTER — Emergency Department (HOSPITAL_COMMUNITY)
Admission: EM | Admit: 2019-12-13 | Discharge: 2019-12-13 | Disposition: A | Discharge status: Home / Self Care | Payer: Medicare Other | Attending: Emergency Medicine | Admitting: Emergency Medicine

## 2019-12-13 ENCOUNTER — Other Ambulatory Visit: Payer: Self-pay

## 2019-12-13 ENCOUNTER — Encounter (HOSPITAL_COMMUNITY): Payer: Self-pay | Admitting: *Deleted

## 2019-12-13 ENCOUNTER — Ambulatory Visit
Admission: EM | Admit: 2019-12-13 | Discharge: 2019-12-13 | Disposition: A | Discharge status: Home / Self Care | Payer: Medicare Other

## 2019-12-13 DIAGNOSIS — Z87891 Personal history of nicotine dependence: Secondary | ICD-10-CM | POA: Insufficient documentation

## 2019-12-13 DIAGNOSIS — Z79899 Other long term (current) drug therapy: Secondary | ICD-10-CM | POA: Insufficient documentation

## 2019-12-13 DIAGNOSIS — Z8616 Personal history of COVID-19: Secondary | ICD-10-CM | POA: Diagnosis not present

## 2019-12-13 DIAGNOSIS — I10 Essential (primary) hypertension: Secondary | ICD-10-CM | POA: Diagnosis not present

## 2019-12-13 DIAGNOSIS — M7989 Other specified soft tissue disorders: Secondary | ICD-10-CM

## 2019-12-13 DIAGNOSIS — R2231 Localized swelling, mass and lump, right upper limb: Secondary | ICD-10-CM | POA: Diagnosis not present

## 2019-12-13 DIAGNOSIS — I2510 Atherosclerotic heart disease of native coronary artery without angina pectoris: Secondary | ICD-10-CM | POA: Insufficient documentation

## 2019-12-13 MED ORDER — ENOXAPARIN SODIUM 100 MG/ML ~~LOC~~ SOLN
1.0000 mg/kg | Freq: Once | SUBCUTANEOUS | Status: AC
  Administered 2019-12-13: 90 mg via SUBCUTANEOUS
  Filled 2019-12-13: qty 1

## 2019-12-13 NOTE — ED Notes (Signed)
Med  Not released  Call to womens who report it is not their night to release med  Call to [facility] no answer  [phone]

## 2019-12-13 NOTE — ED Triage Notes (Signed)
Pt has pain in right forearm, had ultrasound of same area on 10/19 , was negative for large blood clot but had small ones , pt now has pain in that arm that developed last night, no redness noted

## 2019-12-13 NOTE — Discharge Instructions (Addendum)
Please check in to the ER tomorrow morning by 8:45 am Please let them know you have a scheduled Ultrasound of the Right arm. You have been given a short acting blood thinner (lovenox) to cover you for 12 hours in case you have a blood clot. Return immediately if you have sudden onset chest pain. Shortness of breath or you pass out.

## 2019-12-13 NOTE — ED Notes (Signed)
Pt [facility] and told them he had blood clot in his R arm  No pain unless he laid it on something or pressed on his arm   Was sent her for an [place] by the UC provider  Here for eval   PCP is Dr Gerarda Fraction, but unable to get in touch w him for follow up appt per pt

## 2019-12-13 NOTE — ED Provider Notes (Signed)
[facility] EMERGENCY DEPARTMENT Provider Note   CSN: [id number] Arrival date & time: 12/13/19  1702     History Chief Complaint  Patient presents with  . Arm Pain    [name] is a 69 y.o. male   Who presents to the emergency department with chief complaint of right upper extremity pain and swelling. On 19 October the patient had pain in his right elbow and was seen in the emergency department.  He had an ultrasound at that time after having an IV placed.  Ultrasound showed superficial thrombosis without DVT.  Patient states that over the past 24 hours he had worsening pain and then his right arm swelled up and almost doubled in size today.  He states that his pain is aching, heavy.  He also noticed that his arm is warm and red.  He denies chest pain or shortness of breath.  HPI     Past Medical History:  Diagnosis Date  . Anxiety   . Articular cartilage disorder involving shoulder region, left 11/2016  . Bursitis of shoulder, left 11/2016  . Complication of anesthesia    hx. of heart rate dropping during 2 surgeries   (2004- back surgery and with Lithotrispy )  . Crohn's disease ([place])   . Depression   . Dyspnea    with exertion  . History of hepatitis    unknown type - 1970s  . History of kidney stones   . Hyperlipidemia    unable to tolerat statins  . Hypertension    states under control with meds., has been on med. since 1994  . Immature cataract of both eyes   . Limited joint range of motion    cervical spine  . Myocardial infarct ([place]) 09/13/1992  . Nonobstructive atherosclerosis of coronary artery   . Numbness in both hands   . Obesity   . Osteoarthritis 11/2016   left shoulder  . Rotator cuff tear, left 11/2016  . Sleep apnea    uses BiPAP, but having issues with mask not sealing   . Thyroid nodule     Patient Active Problem List   Diagnosis Date Noted  . Pneumonia due to COVID-19 virus 11/12/2019  . Cervical spinal stenosis 11/12/2015  . Crohn  disease ([place]) 01/20/2015  . Rectal fistula 01/20/2015  . Noninfectious gastroenteritis, unspecified   . Rectal bleeding 10/21/2014  . Bowel habit changes 10/21/2014  . Obesity (BMI 30.0-34.9) 05/18/2014  . OSA (obstructive sleep apnea) 03/30/2013  . Chest pain 02/15/2013  . HTN (hypertension) 02/15/2013  . CAD (coronary artery disease) 02/15/2013  . Dyslipidemia 02/15/2013  . HAND, ARTHRITIS, DEGEN./OSTEO 12/01/2008  . TRIGGER FINGER 12/01/2008    Past Surgical History:  Procedure Laterality Date  . ANAL FISTULOTOMY N/A 06/28/2013   Procedure: ANAL FISTULOTOMY;  Surgeon: [name], MD;  [facility];  Service: General;  Laterality: N/A;  . ANAL FISTULOTOMY  2017  . ANTERIOR CERVICAL DECOMP/DISCECTOMY FUSION N/A 11/12/2015   Procedure: ANTERIOR CERVICAL DECOMPRESSION/DISCECTOMY FUSION CERVICAL THREE- CERVICAL FOUR, CERVICAL FOUR- CERVICAL FIVE;  Surgeon: Jovita Gamma, MD;  Location: [employer];  Service: Neurosurgery;  Laterality: N/A;  ANTERIOR CERVICAL DECOMPRESSION/DISCECTOMY FUSION C3-C4, C4-C5  . BIOPSY THYROID    . CARDIAC CATHETERIZATION  1994, 12/25/2007   a. PTCA alone in 1994 b. RCA CTO w/ L-R collaterals, 40-50% prox RCA, 20% prox LAD; EF 50-55%, inferoapical HK  . CARDIAC CATHETERIZATION  04/02/2001  . CARDIAC CATHETERIZATION  11/07/2000  . CARDIAC CATHETERIZATION  09/24/1996  .  CARPAL TUNNEL RELEASE Left 10/09/2014   Procedure: LEFT CARPAL TUNNEL RELEASE;  Surgeon: [name], MD;  Location: [place];  Service: Orthopedics;  Laterality: Left;  . CARPAL TUNNEL RELEASE Right 08/28/2003  . CAUTERY OF TURBINATES  11/05/2003  . COLONOSCOPY N/A 11/06/2014   RMR: Skipped Colonic ulcerations with significant ileocecal valve involvement and rectal sparing most consisitant with Crohns disease. Minimal non-steroidal drug use. Probable colonic lipoma  . CYSTECTOMY    . INCISION AND DRAINAGE ABSCESS Right 05/23/2012   Procedure: INCISION AND DRAINAGE ABSCESS;   Surgeon: [name], MD;  [facility];  Service: General;  Laterality: Right;  . KNEE ARTHROSCOPY    . LITHOTRIPSY    . LUMBAR FUSION  02/18/2002  . LUMBAR LAMINECTOMY  02/18/2002   L4-5  . NASAL SEPTUM SURGERY  11/05/2003  . PARTIAL NEPHRECTOMY Left    age 47  . SHOULDER ARTHROSCOPY WITH SUBACROMIAL DECOMPRESSION, ROTATOR CUFF REPAIR AND BICEP TENDON REPAIR Left 11/30/2016   Procedure: LEFT SHOULDER ARTHROSCOPY, DEBRIDEMENT, ACROMIOPLASTY WITH DISTAL CLAVICAL EXCISION, POSSIBLE ROTATOR CUFF REPAIR AND BICEP TENODESIS;  Surgeon: Ninetta Lights, MD;  Location: [place];  Service: Orthopedics;  Laterality: Left;  . SPLENECTOMY     age 45  . TRANSTHORACIC ECHOCARDIOGRAM  03/05/2009   EF >55%, normal LV wall thickness.       Family History  Problem Relation Age of Onset  . CAD Father        CABG   . CAD Brother        CABG in 10s  . CAD Brother   . Arthritis/Rheumatoid Mother   . Hyperlipidemia Mother   . Thrombocytopenia Mother   . COPD Son        smoker  . Alzheimer's disease Maternal Grandmother   . Lung disease Paternal Grandfather     Social History   Tobacco Use  . Smoking status: Former Smoker    Packs/day: 0.00    Years: 18.00    Pack years: 0.00    Quit date: 02/06/1993    Years since quitting: 26.8  . Smokeless tobacco: Former Network engineer  . Vaping Use: Never used  Substance Use Topics  . Alcohol use: No    Alcohol/week: 0.0 standard drinks  . Drug use: No    Home Medications Prior to Admission medications   Medication Sig Start Date End Date Taking? Authorizing Provider  albuterol (VENTOLIN HFA) 108 (90 Base) MCG/ACT inhaler Inhale 2 puffs into the lungs every 4 (four) hours as needed for wheezing or shortness of breath (cough). 11/13/19   Roxan Hockey, MD  ALPRAZolam Duanne Moron) 0.5 MG tablet Take 1 tablet (0.5 mg total) by mouth 3 (three) times daily as needed (dizziness). Patient not taking: Reported on 11/12/2019 12/31/16   Evalee Jefferson,  PA-C  amLODipine (NORVASC) 5 MG tablet Take 1 tablet (5 mg total) by mouth daily. 03/28/13   Troy Sine, MD  ascorbic acid (VITAMIN C) 500 MG tablet Take 1 tablet (500 mg total) by mouth daily. 11/14/19   Roxan Hockey, MD  Evolocumab (REPATHA SURECLICK) 299 MG/ML SOAJ Inject 140 mg into the skin every 14 (fourteen) days. Patient not taking: Reported on 11/12/2019 02/12/18   Troy Sine, MD  guaiFENesin-dextromethorphan [facility] DM) 100-10 MG/5ML syrup Take 10 mLs by mouth every 4 (four) hours as needed for cough. 11/13/19   Roxan Hockey, MD  loperamide (IMODIUM) 2 MG capsule Take 1 capsule (2 mg total) by mouth every 12 (  twelve) hours as needed for diarrhea or loose stools. 11/13/19   Roxan Hockey, MD  metoprolol succinate (TOPROL-XL) 50 MG 24 hr tablet Take 50 mg by mouth daily. Take with or immediately following a meal.    [provider]  naproxen (NAPROSYN) 375 MG tablet Take 1 tablet (375 mg total) by mouth 2 (two) times daily. Take with food 11/26/19   Triplett, Tammy, PA-C  nitroGLYCERIN (NITROSTAT) 0.4 MG SL tablet Place 1 tablet (0.4 mg total) under the tongue every 5 (five) minutes as needed for chest pain. 01/12/17   Troy Sine, MD  ondansetron (ZOFRAN) 4 MG tablet Take 1 tablet (4 mg total) by mouth every 6 (six) hours as needed for nausea. 11/13/19   Roxan Hockey, MD  Probiotic Product (PROBIOTIC DAILY PO) Take 1 capsule by mouth daily. [place]    [provider]  Psyllium Husk POWD Take 12 g by mouth daily. INTO 12 OUNCES OF WATER (1 TABLESPOON)    [provider]  ramipril (ALTACE) 10 MG capsule Take 1 capsule (10 mg total) by mouth daily. Please do NOT  Restart Ramipril until 12/09/2019 12/09/19   Roxan Hockey, MD  zinc sulfate 220 (50 Zn) MG capsule Take 1 capsule (220 mg total) by mouth daily. 11/14/19   Roxan Hockey, MD    Allergies    Niacin and related, Penicillins, and Statins  Review of Systems   Review of  Systems Ten systems reviewed and are negative for acute change, except as noted in the HPI.   Physical Exam Updated Vital Signs BP (!) 149/90 (BP Location: Left Arm)   Pulse 74   Temp 97.7 F (36.5 C) (Temporal)   Resp 18   SpO2 100%   Physical Exam Vitals and nursing note reviewed.  Constitutional:      General: He is not in acute distress.    Appearance: He is well-developed. He is not diaphoretic.  HENT:     Head: Normocephalic and atraumatic.  Eyes:     General: No scleral icterus.    Conjunctiva/sclera: Conjunctivae normal.  Cardiovascular:     Rate and Rhythm: Normal rate and regular rhythm.     Heart sounds: Normal heart sounds.  Pulmonary:     Effort: Pulmonary effort is normal. No respiratory distress.     Breath sounds: Normal breath sounds.  Abdominal:     Palpations: Abdomen is soft.     Tenderness: There is no abdominal tenderness.  Musculoskeletal:     Cervical back: Normal range of motion and neck supple.     Comments: R arm with significant swelling. Mild erythema,warm to touch.  Skin:    General: Skin is warm and dry.  Neurological:     Mental Status: He is alert.  Psychiatric:        Behavior: Behavior normal.     ED Results / Procedures / Treatments   Labs (all labs ordered are listed, but only abnormal results are displayed) Labs Reviewed - No data to display  EKG None  Radiology No results found.  Procedures Procedures (including critical care time)  Medications Ordered in ED Medications  enoxaparin (LOVENOX) injection 90 mg (90 mg Subcutaneous Given 12/13/19 2242)    ED Course  I have reviewed the triage vital signs and the nursing notes.  Pertinent labs & imaging results that were available during my care of the patient were reviewed by me and considered in my medical decision making (see chart for details).  MDM Rules/Calculators/A&P                         Patient here with significant right arm swelling.  Patient states  that he had pain previously but the swelling just came on all of a sudden.  I have a high concern for potential deep deep vein thrombosis and given his recent superficial thrombosis.  Patient will be given subcu Lovenox dose tonight.  I have ordered and scheduled a repeat ultrasound of the right upper extremity for tomorrow morning at 9 AM.  Patient given return instructions and he is appropriate for discharge at this time  Final Clinical Impression(s) / ED Diagnoses Final diagnoses:  Swelling of arm    Rx / DC Orders ED Discharge Orders         Ordered    US Venous Img Upper Uni Right        12/13/19 2143           Margarita Mail, PA-C 12/13/19 2308    Milton Ferguson, MD 12/14/19 2059

## 2019-12-13 NOTE — ED Triage Notes (Signed)
Pain in right arm, states he recently was diagnosed with clots in arm

## 2019-12-14 ENCOUNTER — Ambulatory Visit (HOSPITAL_COMMUNITY)
Admission: RE | Admit: 2019-12-14 | Discharge: 2019-12-14 | Disposition: A | Discharge status: Home / Self Care | Payer: Medicare Other | Source: Ambulatory Visit | Attending: Emergency Medicine | Admitting: Emergency Medicine

## 2019-12-14 DIAGNOSIS — M79661 Pain in right lower leg: Secondary | ICD-10-CM | POA: Diagnosis not present

## 2019-12-14 DIAGNOSIS — I808 Phlebitis and thrombophlebitis of other sites: Secondary | ICD-10-CM | POA: Diagnosis not present

## 2019-12-14 DIAGNOSIS — M79601 Pain in right arm: Secondary | ICD-10-CM | POA: Insufficient documentation

## 2019-12-14 DIAGNOSIS — I82611 Acute embolism and thrombosis of superficial veins of right upper extremity: Secondary | ICD-10-CM | POA: Diagnosis not present

## 2019-12-14 DIAGNOSIS — R609 Edema, unspecified: Secondary | ICD-10-CM | POA: Diagnosis not present

## 2019-12-14 DIAGNOSIS — M7989 Other specified soft tissue disorders: Secondary | ICD-10-CM | POA: Diagnosis not present

## 2019-12-14 NOTE — ED Provider Notes (Signed)
Blood pressure (!) 149/90, pulse 74, temperature 97.7 F (36.5 C), temperature source Temporal, resp. rate 18, SpO2 100 %.  In short, [name] is a 69 y.o. male with a chief complaint of Arm Pain .  Refer to the original H&P for additional details.  Patient returns for right upper extremity ultrasound this morning.  No evidence of DVT but does have some superficial thrombophlebitis.  No anticoagulation at this point.  Plan for warm compresses and close PCP follow-up.  Patient with history of inflammatory bowel disease.  Kidney function is normal.  The patient's age would defer decision to use NSAIDs for pain to his PCP. Plan for Tylenol and warm compresses at home. Discussed with patient at time of discharge.     Margette Fast, MD 12/14/19 [phone]

## 2019-12-24 DIAGNOSIS — I808 Phlebitis and thrombophlebitis of other sites: Secondary | ICD-10-CM | POA: Diagnosis not present

## 2019-12-24 DIAGNOSIS — I10 Essential (primary) hypertension: Secondary | ICD-10-CM | POA: Diagnosis not present

## 2019-12-24 DIAGNOSIS — G4700 Insomnia, unspecified: Secondary | ICD-10-CM | POA: Diagnosis not present

## 2020-01-07 DIAGNOSIS — E7849 Other hyperlipidemia: Secondary | ICD-10-CM | POA: Diagnosis not present

## 2020-01-07 DIAGNOSIS — I10 Essential (primary) hypertension: Secondary | ICD-10-CM | POA: Diagnosis not present

## 2020-02-07 DIAGNOSIS — I10 Essential (primary) hypertension: Secondary | ICD-10-CM | POA: Diagnosis not present

## 2020-02-07 DIAGNOSIS — E7849 Other hyperlipidemia: Secondary | ICD-10-CM | POA: Diagnosis not present

## 2020-02-12 ENCOUNTER — Other Ambulatory Visit: Payer: Self-pay

## 2020-02-12 ENCOUNTER — Ambulatory Visit: Payer: Medicare Other | Admitting: Cardiovascular Disease

## 2020-02-12 ENCOUNTER — Encounter: Payer: Self-pay | Admitting: Cardiovascular Disease

## 2020-02-12 VITALS — BP 140/72 | HR 70 | Temp 98.2°F | Ht 69.0 in | Wt 205.6 lb

## 2020-02-12 DIAGNOSIS — E785 Hyperlipidemia, unspecified: Secondary | ICD-10-CM

## 2020-02-12 DIAGNOSIS — I10 Essential (primary) hypertension: Secondary | ICD-10-CM

## 2020-02-12 DIAGNOSIS — I252 Old myocardial infarction: Secondary | ICD-10-CM

## 2020-02-12 DIAGNOSIS — I82890 Acute embolism and thrombosis of other specified veins: Secondary | ICD-10-CM

## 2020-02-12 DIAGNOSIS — I2510 Atherosclerotic heart disease of native coronary artery without angina pectoris: Secondary | ICD-10-CM | POA: Diagnosis not present

## 2020-02-12 DIAGNOSIS — U071 COVID-19: Secondary | ICD-10-CM

## 2020-02-12 DIAGNOSIS — Z789 Other specified health status: Secondary | ICD-10-CM | POA: Diagnosis not present

## 2020-02-12 DIAGNOSIS — G4733 Obstructive sleep apnea (adult) (pediatric): Secondary | ICD-10-CM | POA: Diagnosis not present

## 2020-02-12 MED ORDER — EZETIMIBE 10 MG PO TABS: 10.0000 mg | ORAL_TABLET | Freq: Every day | ORAL | 3 refills | Status: DC

## 2020-02-12 NOTE — Progress Notes (Signed)
Patient ID: [name], male   DOB: 06-May-1950, 70 y.o.   MRN: [id number]     HPI: [name] is a 70 y.o. male who is a former patient of Dr. Rollene Fare who presents for a 21 month follow-up cardiology/sleep evaluation.  [name] has CAD and suffered an inferior wall MI in 1994.  Cardiac catheterization revealed RCA occlusion with left-to-right collaterals. He was treated initially with PTCA of his RCA. His last cardiac catheterization was in 2009 which showed an RCA occlusion with collaterals with no significant LAD disease, although there was mild 20% diagonal stenosis.  An echo Doppler study done in August 2014 by Dr. Rollene Fare showed mild LVH with mild hypokinesis of the inferolateral wall but with preserved global systolic function with an EF of 55-60%. He had normal diastolic parameters.  In January 2015 he was admitted by the hospitalist service with chest pain. A stress Myoview study did not demonstrate convincing inducible myocardial ischemia. There was limited evaluation of the inferior wall due to movement and overlap of the diaphragm and activity within the left lobe of the liver. Ejection fraction was 47%. He was seen by Dr. Rayann Heman for cardiology consultation who recommended no further cardiac workup.  Lipid evaluation reveals a total cholesterol 163 triglycerides 100 HDL 31 LDL 112 in the past, he had some difficulty taking statins.  Additional problems include hypertension, obesity, and arthritis. He has a remote history of right buttock abscess leading to hospitalization in December 2013.  When I initially saw him, I discontinued his gemfibrozil and recommended a trial of Livalo to take and hopefully tolerate  along with coenzyme Q10.  I titrated his amlodipine to 5 mg daily for more optimal blood pressure control.  I was also very concerned about obstructive sleep apnea and referred him for a sleep study.  Due to my concerns for obstructive sleep apnea, he underwent a split-night  protocol, on 05/10/2012.  This revealed severe sleep apnea with an AHI of 75.6 per hour.  During REM sleep this was 40 per hour.  He had significant oxygen desaturation to 78% with non-REM sleep and 76% with REM sleep.  He also had a significant positional component to his sleep apnea.  He was started on CPAP titration but due to high-pressure he was was switched to a BiPAP mode.  Ultimately, a BiPAP auto unit was recommended with an EPAP minimum of 10 and a IPAP maximum up to 25.  He also did have periodic lid movements with sleep with an index of 18.  He apparently initially used BiPAP for only 3 weeks but was having difficulty with his mass.  However, before  being seen for f/u he returned his BiPAP unit due to concerns that he was not compliant and would have to pay for this without insurance.  His sleep was very poor, he was unable to sleep on his back, and snored loudly.  I referred him for a follow-up sleep study which was done on 08/10/2015 which confirmed severe sleep apnea with an AHI of 95.7.  He ultimately underwent CPAP and then BiPAP titration and was titrated up to 17/13.  A BiPAP auto therapy was recommended with an EPAP minimum of 10 and IPAP maximum of 25.  He received his new BiPAP machine on 09/05/2015 and is compliant until the very beginning of October.  I reviewed his ResMed compliance data, which documents compliance, both for usage stays as well as usage greater than 4 hours.  However, on  10/05/2017bhe underwent cervical neck surgery by Dr. Rita Ohara and when he was wearing his cervical collar was not able to use his CPAP. His DME company is [employer].  He was diagnosed with Crohn's disease following a colonoscopy.  He has had issues with anal fissures and fistula.  Since I last saw him, he underwent surgery for an anal fistula repair in January 2018.  He tolerated that well from a cardiovascular standpoint.  He denies any episodes of chest pain or palpitations.  He continues to  use CPAP but has been having difficulty with his full face mask.  I obtained a download in the office from 06/06/2016 through 07/05/2016.  That time, he was only using treatment for 3 hours and 33 minutes per night. He has an ResMed AirCurve 10 Auto BiPAP  unit with maximum average IPAP pressure at 17 EPAP pressure of 13.  He had significant mask leak, I recommended a trial of a new dream where fullface mask.  When you receive this, he had an incredible night sleep.  The first night.  However, after the first night he had difficulty with mask adjustment to his nose region.  As result, his only been intermittently using therapy.   He has been intolerant to Crestor, Lipitor, Zocor, Pravachol, and Livalo.  When checked by Dr. Gerarda Fraction in September 2017 LDL cholesterol was 161 with a total cholesterol 225.  He underwent left shoulder arthroscopy with subacromial decompression, rotator cuff repair and bicep tendon repair by Dr. Amada Jupiter on 11/30/2016.  On 12/31/2016 he presented to the emergency room with vertigo symptoms.  Apparently, a CT  was negative and there was some response to meclizine.  He was given information regarding Epley maneuvers to try at home.  When I  saw him in November 2018 he denied any chest pain, PND, orthopnea, or palpitations.  On Jun 30, 2017 he had undergone repeat laboratory which showed a total cholesterol of 214, HDL 33, LDL 147, triglycerides 109.  Trial of Zetia had been recommended but apparently he did not do this since he states in the past he did not derive any significant benefit.  He stopped using CPAP again concerns with his mask.    I  saw him in June 2019.  At that time he was not using his BiPAP due to difficulty with his mask and I had recommended a trial of a new ResMed air fit P 30 I DreamWear like mask.  Apparently, his wife had issues with her mask and he had given his new mask to her.  As result he has not had any mass.  He would recommend now however the full  facemask rather than just the nasal DreamWear style.  In addition, due to his statin intolerance I initiated evaluation for PCSK9 inhibition.  Apparently he did not get initial approval with Repatha but initiated Praluent.  He has been on the Praluent assistance program and has not had any self cost.  He took Praluent for 4 injections but shortly after each injection he  began to notice the development of a rash.  He stopped using this for 3 weeks and then restarted again on December 15 and noticed this slight rash on his arms on Christmas Day.  He took his last dose yesterday and again notices a rash in his arms. Repeat laboratory has shown improvement with his LDL cholesterol most recently at 58 down from 126.    He was last evaluated by me in a telemedicine  visit in April 2020.  At that time his blood pressure was controlled on amlodipine 5 mg, ramipril 10 mg, and metoprolol 50 mg.  He was not having any anginal symptomatology.  He was using BiPAP therapy and had a DreamWear fullface mask which he had tolerated better.  Over the last several years he has been fairly stable but unfortunately both he and his wife developed COVID.  His wife unfortunately died from [place] on 2019/12/16.  He was hospitalized on November 12, 2019 and received monoclonal antibody infusion on November 12, 2019.  Unfortunately both he and his wife were unvaccinated.  He has had subsequent antipain ER evaluations since with right upper extremity swelling and pain.  He was found to have DVT did have right upper extremity cephalic and basilic vein superficial thromboses.  Presently he denies any chest pain.  He continues to be on amlodipine 5 mg, metoprolol succinate 50 mg, and ramipril 10 mg for hypertension.  He is no longer on PCSK9 inhibition and is not on any medication for his lipids.  He presents for evaluation.  Past Medical History:  Diagnosis Date   Anxiety    Articular cartilage disorder involving shoulder region,  left 11/2016   Bursitis of shoulder, left 81/0175   Complication of anesthesia    hx. of heart rate dropping during 2 surgeries   (2004- back surgery and with Lithotrispy )   Crohn's disease ([place])    Depression    Dyspnea    with exertion   History of hepatitis    unknown type - 1970s   History of kidney stones    Hyperlipidemia    unable to tolerat statins   Hypertension    states under control with meds., has been on med. since 1994   Immature cataract of both eyes    Limited joint range of motion    cervical spine   Myocardial infarct ([place]) 09/13/1992   Nonobstructive atherosclerosis of coronary artery    Numbness in both hands    Obesity    Osteoarthritis 11/2016   left shoulder   Rotator cuff tear, left 11/2016   Sleep apnea    uses BiPAP, but having issues with mask not sealing    Thyroid nodule     Past Surgical History:  Procedure Laterality Date   ANAL FISTULOTOMY N/A 06/28/2013   Procedure: ANAL FISTULOTOMY;  Surgeon: [name], MD;  Location: [facility];  Service: General;  Laterality: N/A;   ANAL FISTULOTOMY  2017   ANTERIOR CERVICAL DECOMP/DISCECTOMY FUSION N/A 11/12/2015   Procedure: ANTERIOR CERVICAL DECOMPRESSION/DISCECTOMY FUSION CERVICAL THREE- CERVICAL FOUR, CERVICAL FOUR- CERVICAL FIVE;  Surgeon: Jovita Gamma, MD;  Location: [facility];  Service: Neurosurgery;  Laterality: N/A;  ANTERIOR CERVICAL DECOMPRESSION/DISCECTOMY FUSION C3-C4, C4-C5   BIOPSY THYROID     CARDIAC CATHETERIZATION  1994, 12/25/2007   a. PTCA alone in 1994 b. RCA CTO w/ L-R collaterals, 40-50% prox RCA, 20% prox LAD; EF 50-55%, inferoapical HK   CARDIAC CATHETERIZATION  04/02/2001   CARDIAC CATHETERIZATION  11/07/2000   CARDIAC CATHETERIZATION  09/24/1996   CARPAL TUNNEL RELEASE Left 10/09/2014   Procedure: LEFT CARPAL TUNNEL RELEASE;  Surgeon: [name], MD;  Location: [facility];  Service: Orthopedics;  Laterality: Left;   CARPAL TUNNEL  RELEASE Right 08/28/2003   CAUTERY OF TURBINATES  11/05/2003   COLONOSCOPY N/A 11/06/2014   RMR: Skipped Colonic ulcerations with significant ileocecal valve involvement and rectal sparing most consisitant with Crohns disease.  Minimal non-steroidal drug use. Probable colonic lipoma   CYSTECTOMY     INCISION AND DRAINAGE ABSCESS Right 05/23/2012   Procedure: INCISION AND DRAINAGE ABSCESS;  Surgeon: [name], MD;  Location: [facility];  Service: General;  Laterality: Right;   KNEE ARTHROSCOPY     LITHOTRIPSY     LUMBAR FUSION  02/18/2002   LUMBAR LAMINECTOMY  02/18/2002   L4-5   NASAL SEPTUM SURGERY  11/05/2003   PARTIAL NEPHRECTOMY Left    age 43   SHOULDER ARTHROSCOPY WITH SUBACROMIAL DECOMPRESSION, ROTATOR CUFF REPAIR AND BICEP TENDON REPAIR Left 11/30/2016   Procedure: LEFT SHOULDER ARTHROSCOPY, DEBRIDEMENT, ACROMIOPLASTY WITH DISTAL CLAVICAL EXCISION, POSSIBLE ROTATOR CUFF REPAIR AND BICEP TENODESIS;  Surgeon: Ninetta Lights, MD;  Location: [facility];  Service: Orthopedics;  Laterality: Left;   SPLENECTOMY     age 46   TRANSTHORACIC ECHOCARDIOGRAM  03/05/2009   EF >55%, normal LV wall thickness.    Allergies  Allergen Reactions   Niacin And Related Other (See Comments)    FLUSHING   Penicillins Hives and Itching    Has patient had a PCN reaction causing immediate rash, facial/tongue/throat swelling, SOB or lightheadedness with hypotension: Unknown Has patient had a PCN reaction causing severe rash involving mucus membranes or skin necrosis: Unknown Has patient had a PCN reaction that required hospitalization: Unknown Has patient had a PCN reaction occurring within the last 10 years: No If all of the above answers are "NO", then may proceed with Cephalosporin use.    Statins Other (See Comments)    MYALGIAS    Current Outpatient Medications  Medication Sig Dispense Refill   albuterol (VENTOLIN HFA) 108 (90 Base) MCG/ACT inhaler Inhale 2 puffs into the lungs  every 4 (four) hours as needed for wheezing or shortness of breath (cough). 18 g 1   ALPRAZolam (XANAX) 0.5 MG tablet Take 1 tablet (0.5 mg total) by mouth 3 (three) times daily as needed (dizziness). 21 tablet 0   amLODipine (NORVASC) 5 MG tablet Take 1 tablet (5 mg total) by mouth daily. 180 tablet 3   ascorbic acid (VITAMIN C) 500 MG tablet Take 1 tablet (500 mg total) by mouth daily. 30 tablet 2   ezetimibe (ZETIA) 10 MG tablet Take 1 tablet (10 mg total) by mouth daily. 90 tablet 3   guaiFENesin-dextromethorphan (ROBITUSSIN DM) 100-10 MG/5ML syrup Take 10 mLs by mouth every 4 (four) hours as needed for cough. 118 mL 0   loperamide (IMODIUM) 2 MG capsule Take 1 capsule (2 mg total) by mouth every 12 (twelve) hours as needed for diarrhea or loose stools. 10 capsule 0   metoprolol succinate (TOPROL-XL) 50 MG 24 hr tablet Take 50 mg by mouth daily. Take with or immediately following a meal.     naproxen (NAPROSYN) 375 MG tablet Take 1 tablet (375 mg total) by mouth 2 (two) times daily. Take with food 10 tablet 0   nitroGLYCERIN (NITROSTAT) 0.4 MG SL tablet Place 1 tablet (0.4 mg total) under the tongue every 5 (five) minutes as needed for chest pain. 25 tablet 3   ondansetron (ZOFRAN) 4 MG tablet Take 1 tablet (4 mg total) by mouth every 6 (six) hours as needed for nausea. 20 tablet 0   Probiotic Product (PROBIOTIC DAILY PO) Take 1 capsule by mouth daily. SUPREMA DOPHILUS     Psyllium Husk POWD Take 12 g by mouth daily. INTO 12 OUNCES OF WATER (1 TABLESPOON)     ramipril (ALTACE) 10 MG capsule Take  1 capsule (10 mg total) by mouth daily. Please do NOT  Restart Ramipril until 12/09/2019 30 capsule 0   zinc sulfate 220 (50 Zn) MG capsule Take 1 capsule (220 mg total) by mouth daily. 30 capsule 0   No current facility-administered medications for this visit.    Social History   Socioeconomic History   Marital status: Widowed    Spouse name: Not on file   Number of children: Not  on file   Years of education: Not on file   Highest education level: Not on file  Occupational History   Not on file  Tobacco Use   Smoking status: Former Smoker    Packs/day: 0.00    Years: 18.00    Pack years: 0.00    Quit date: 02/06/1993    Years since quitting: 27.0   Smokeless tobacco: Former Counsellor Use: Never used  Substance and Sexual Activity   Alcohol use: No    Alcohol/week: 0.0 standard drinks   Drug use: No   Sexual activity: Yes  Other Topics Concern   Not on file  Social History Narrative   Lives in [place], [name]. Married, 2 children.    Social Determinants of Health   Financial Resource Strain: Not on file  Food Insecurity: Not on file  Transportation Needs: Not on file  Physical Activity: Not on file  Stress: Not on file  Social Connections: Not on file  Intimate Partner Violence: Not on file    Family History  Problem Relation Age of Onset   CAD Father        CABG    CAD Brother        CABG in 79s   CAD Brother    Arthritis/Rheumatoid Mother    Hyperlipidemia Mother    Thrombocytopenia Mother    COPD Son        smoker   Alzheimer's disease Maternal Grandmother    Lung disease Paternal Grandfather    ROS General: Negative; No fevers, chills, or night sweats HEENT: Negative; No changes in vision or hearing, sinus congestion, difficulty swallowing Pulmonary: Negative; No cough, wheezing, shortness of breath, hemoptysis Cardiovascular: Negative; No chest pain, presyncope, syncope, palpatations Right upper extremity superficial thrombosis GI: Negative; No nausea, vomiting, diarrhea, or abdominal pain GU: Negative; No dysuria, hematuria, or difficulty voiding Musculoskeletal: Positive for cervical disc disease. Hematologic: Negative; no easy bruising, bleeding Endocrine: Negative; no heat/cold intolerance Neuro: Negative; no changes in balance, headaches Skin: Dermatologic rash following initiation of  Praluent Psychiatric: Negative; No behavioral problems, depression Sleep: positive severe sleep apnea on  BiPAP   PE BP 140/72    Pulse 70    Temp 98.2 F (36.8 C)    Ht '5\' 9"'  (1.753 m)    Wt 205 lb 9.6 oz (93.3 kg)    SpO2 97%    BMI 30.36 kg/m   Repeat blood pressure by me was 132/72  Wt Readings from Last 3 Encounters:  02/12/20 205 lb 9.6 oz (93.3 kg)  12/07/19 198 lb 6.6 oz (90 kg)  11/26/19 200 lb (90.7 kg)     Physical Exam BP 140/72    Pulse 70    Temp 98.2 F (36.8 C)    Ht '5\' 9"'  (1.753 m)    Wt 205 lb 9.6 oz (93.3 kg)    SpO2 97%    BMI 30.36 kg/m  General: Alert, oriented, no distress.  Skin:  mild rash on the volar aspect of  both forearms HEENT: Normocephalic, atraumatic. Pupils equal round and reactive to light; sclera anicteric; extraocular muscles intact; Fundi ** Nose without nasal septal hypertrophy Mouth/Parynx benign; Mallinpatti scale Neck: No JVD, no carotid bruits; normal carotid upstroke Lungs: clear to ausculatation and percussion; no wheezing or rales Chest wall: without tenderness to palpitation Heart: PMI not displaced, RRR, s1 s2 normal, 1/6 systolic murmur, no diastolic murmur, no rubs, gallops, thrills, or heaves Abdomen: soft, nontender; no hepatosplenomehaly, BS+; abdominal aorta nontender and not dilated by palpation. Back: no CVA tenderness Pulses 2+ Musculoskeletal: full range of motion, normal strength, no joint deformities Extremities: no clubbing cyanosis or edema, Homan's sign negative  Neurologic: grossly nonfocal; Cranial nerves grossly wnl Psychologic: Normal mood and affect   ECG (independently read by me): Normal sinus rhythm at 70 bpm.  First-degree AV block.  Isolated PVC.  June 2019 ECG (independently read by me): Normal sinus rhythm at 64 bpm with first-degree AV block.  PR interval 210 ms.  No ectopy.  November 2018 ECG (independently read by me): Normal sinus rhythm at 65 bpm.  First-degree AV block with a PR interval of  212 ms.  No syncope.  No ST segment changes.  May 2018 ECG (independently read by me): Normal sinus rhythm at 65 bpm.  Normal intervals.  No ectopy.  Small nondiagnostic inferior Q waves   October 2017 ECG (independently read by me): Sinus rhythm with first-degree AV block.  Will intervals.  May 2017 ECG (independently read by me): Normal sinus rhythm at 73 bpm.  Small nondiagnostic inferior Q waves.  No ST segment changes.  April 2016 ECG (independently read by me): Normal sinus rhythm at 68 bpm.  Nonspecific T changes.  Normal intervals.  April 2015 ECG (independently read by me): Normal sinus rhythm at 61 beats per minute.  Normal intervals  Prior 03/28/2013 ECG (independently read by me): Sinus rhythm at 66 beats per minute. No ectopy. Q-wave in lead 3   LABS:  BMP Latest Ref Rng & Units 12/07/2019 11/13/2019 11/12/2019  Glucose 70 - 99 mg/dL 99 91 92  BUN 8 - 23 mg/dL 5(L) 14 20  Creatinine 0.61 - 1.24 mg/dL 0.69 0.97 1.28(H)  BUN/Creat Ratio 10 - 24 - - -  Sodium 135 - 145 mmol/L 138 131(L) 128(L)  Potassium 3.5 - 5.1 mmol/L 3.1(L) 3.9 3.6  Chloride 98 - 111 mmol/L 104 105 100  CO2 22 - 32 mmol/L 24 18(L) 17(L)  Calcium 8.9 - 10.3 mg/dL 8.7(L) 7.9(L) 8.1(L)    Hepatic Function Latest Ref Rng & Units 11/13/2019 11/12/2019 03/16/2017  Total Protein 6.5 - 8.1 g/dL 6.8 6.9 7.2  Albumin 3.5 - 5.0 g/dL 3.3(L) 3.6 4.1  AST 15 - 41 U/L 42(H) 43(H) 18  ALT 0 - 44 U/L 35 39 19  Alk Phosphatase 38 - 126 U/L 55 63 96  Total Bilirubin 0.3 - 1.2 mg/dL 0.6 0.8 0.3    CBC Latest Ref Rng & Units 12/07/2019 11/13/2019 11/12/2019  WBC 4.0 - 10.5 K/uL 5.8 6.7 4.6  Hemoglobin 13.0 - 17.0 g/dL 13.8 14.2 15.6  Hematocrit 39.0 - 52.0 % 40.6 40.9 44.4  Platelets 150 - 400 K/uL 507(H) 242 254    Lab Results  Component Value Date   MCV 91.4 12/07/2019   MCV 89.7 11/13/2019   MCV 87.7 11/12/2019    Lab Results  Component Value Date   TSH 1.315 06/10/2014   No results found for: HGBA1C    Lipid Panel  Component Value Date/Time   CHOL 117 12/29/2017 0812   TRIG 63 11/12/2019 1200   HDL 34 (L) 12/29/2017 0812   CHOLHDL 5.5 (H) 03/16/2017 0857   CHOLHDL 6.0 06/10/2014 0811   VLDL 34 06/10/2014 0811   LDLCALC 58 12/29/2017 0812   BNP No results found for: PROBNP   RADIOLOGY: No results found.  IMPRESSION:  1. CAD in native artery   2. Old MI (myocardial infarction)   3. Essential hypertension   4. Hyperlipidemia with target LDL less than 70   5. OSA (obstructive sleep apnea)   6. Statin intolerance   7. Superficial vein thrombosis   8. COVID-19: October 2021    ASSESSMENT AND PLAN: Mr. Berkley Cronkright is a 70 year-old gentleman who suffered an inferior wall myocardial infarction in 1994 due to total RCA occlusion with good left to right collaterals without significant contralateral disease. His last nuclear perfusion study did not demonstrate significant ischemia. However, ejection fraction was 47%. His echo Doppler study did show inferolateral wall motion abnormality.  He has been fairly stable without recurrent anginal symptoms.  Amlodipine 5 mg, metoprolol succinate 50 mg, and ramipril 10 mg.  He has a history of significant hyperlipidemia and developed statin intolerance.  Although he had initially had an excellent initial benefit with PCSK9 inhibition, unfortunately he developed rash both with Repatha as well as with Praluent and is no longer on PCSK9 inhibition therapy.  I have recommended a trial of Zetia for lipid lowering therapy and if he tolerates Zetia he may be a candidate for combination therapy with bempedoic acid.  Unfortunately, however this medication is expensive.  He continues to be on BiPAP therapy for his obstructive sleep apnea in the past has had a DreamWear full facemask.  Unfortunately both he and his wife are unvaccinated and he developed COVID infection and received monoclonal antibody infusion on November 12, 2019.  Unfortunately his wife passed  away from [place] on November 30, 2019.  Mr. Tout has documented superficial thrombosis of his right basilic vein cephalic veins without DVT.  I will recheck chemistry lipid studies in 2 to 3 months.  If LDL is still increased on Zetia alone then I will consider initiation of combined therapy with bempedoic acid.    Troy Sine, MD, [facility]  02/14/2020 12:00 PM

## 2020-02-12 NOTE — Patient Instructions (Signed)
Medication Instructions:  START Zetia 10 mg daily  *If you need a refill on your cardiac medications before your next appointment, please call your pharmacy*   Lab Work: Please return for FASTING labs in 3 months (CMET,Lipid)  Our in office lab hours are Monday-Friday 8:00-4:00, closed for lunch 12:45-1:45 pm.  No appointment needed.  Follow-Up: At [facility], you and your health needs are our priority.  As part of our continuing mission to provide you with exceptional heart care, we have created designated Provider Care Teams.  These Care Teams include your primary Cardiologist (physician) and Advanced Practice Providers (APPs -  Physician Assistants and Nurse Practitioners) who all work together to provide you with the care you need, when you need it.  We recommend signing up for the patient portal called "MyChart".  Sign up information is provided on this After Visit Summary.  MyChart is used to connect with patients for Virtual Visits (Telemedicine).  Patients are able to view lab/test results, encounter notes, upcoming appointments, etc.  Non-urgent messages can be sent to your provider as well.   To learn more about what you can do with MyChart, go to [email or web address].    Your next appointment:   6 month(s)  The format for your next appointment:   In Person  Provider:   Nicki Guadalajara, MD

## 2020-02-14 ENCOUNTER — Encounter: Payer: Self-pay | Admitting: Cardiovascular Disease

## 2020-02-16 NOTE — Progress Notes (Unsigned)
Office Visit Note  Patient: [name]             Date of Birth: [birth year]           MRN: [id number]             PCP: Redmond School, MD Referring: Redmond School, MD Visit Date: 02/17/2020 Occupation: Retired Dealer  Subjective:  New Patient (Initial Visit) (Total body pain, bil hand numbness, muscle weakness, bil shoulder pain, muscle pull in chest when lifting shoulders, neck pain, low back pain, bil wrist pain)   History of Present Illness: [name] is a 70 y.o. male with a history of HTN, CAD and MI s/p PTCA, crohn's disease, left shoulder rotator buff and biceps tendon repair here for evaluation of joint pain and swelling. He had a recent hospital encounter for right arm superficial thrombophlebitis with pain and swelling in November after earlier COVID infection in October. He is somewhat depressed since his wife passed away with COVID infection in October with poor mood and loss of interest in his usual activities, he was previously very active with mechanical and working on his property. His right arm swelling and pain went down after the clot episode but does continue having some more pain in the right wrist and thumb still. His joint pain is longstanding overall he has had multiple spinal surgeries over the years and bilateral carpal tunnel release surgeries. He has not noticed much benefit with topical analgesics.  He has compression glove has not had a great benefit with use.  Over past years he has had multiple interventions including therapy, chiropractic, injections so these have been beneficial in the past.  He is having pain and stiffness, morning stiffness lasting half an hour feels better with warming, difficulty buttoning shirt buttons and fine motor skills. In addition to joint pains he also complains of chronic dry mouth.  He notices salivary pooling overnight when breathing through his mouth but despite this still feels extremely dry.  He has tried sucking on  lozenges and try Biotene mouth spray without noticing major changes He has history of Crohn's disease identified with previous colonoscopy.  Currently he manages this with psyllium.  He was apparently recommended some expensive medication in the past that he did not take.  No history of taking prior steroid or immunosuppressive treatment for the Crohn's.   Activities of Daily Living:  Patient reports morning stiffness for 30 minutes.   Patient Denies nocturnal pain.  Difficulty dressing/grooming: Reports Difficulty climbing stairs: Denies Difficulty getting out of chair: Denies Difficulty using hands for taps, buttons, cutlery, and/or writing: Reports  Review of Systems  Constitutional: Positive for fatigue.  HENT: Positive for mouth dryness.   Eyes: Positive for dryness.  Respiratory: Negative for shortness of breath.   Cardiovascular: Negative for swelling in legs/feet.  Gastrointestinal: Positive for diarrhea.  Endocrine: Positive for cold intolerance, excessive thirst and increased urination.  Genitourinary: Negative for difficulty urinating.  Musculoskeletal: Positive for arthralgias, joint pain, muscle weakness, morning stiffness and muscle tenderness.  Skin: Positive for rash.  Allergic/Immunologic: Negative for susceptible to infections.  Neurological: Positive for numbness and weakness.  Hematological: Negative for bruising/bleeding tendency.  Psychiatric/Behavioral: Positive for sleep disturbance.    PMFS History:  Patient Active Problem List   Diagnosis Date Noted  . Trigger finger 02/17/2020  . Dry mouth 02/17/2020  . Pneumonia due to COVID-19 virus 11/12/2019  . Numbness and tingling in both hands 07/17/2019  . Cervical spinal stenosis  11/12/2015  . Crohn disease ([place]) 01/20/2015  . Rectal fistula 01/20/2015  . Noninfectious gastroenteritis, unspecified   . Rectal bleeding 10/21/2014  . Bowel habit changes 10/21/2014  . Obesity (BMI 30.0-34.9) 05/18/2014  . OSA  (obstructive sleep apnea) 03/30/2013  . Chest pain 02/15/2013  . HTN (hypertension) 02/15/2013  . CAD (coronary artery disease) 02/15/2013  . Dyslipidemia 02/15/2013  . Osteoarthrosis, hand 12/01/2008    Past Medical History:  Diagnosis Date  . Anxiety   . Articular cartilage disorder involving shoulder region, left 11/2016  . Bursitis of shoulder, left 11/2016  . Complication of anesthesia    hx. of heart rate dropping during 2 surgeries   (2004- back surgery and with Lithotrispy )  . Crohn's disease ([place])   . Depression   . Dyspnea    with exertion  . History of hepatitis    unknown type - 1970s  . History of kidney stones   . Hyperlipidemia    unable to tolerat statins  . Hypertension    states under control with meds., has been on med. since 1994  . Immature cataract of both eyes   . Limited joint range of motion    cervical spine  . Myocardial infarct ([place]) 09/13/1992  . Nonobstructive atherosclerosis of coronary artery   . Numbness in both hands   . Obesity   . Osteoarthritis 11/2016   left shoulder  . Rotator cuff tear, left 11/2016  . Sleep apnea    uses BiPAP, but having issues with mask not sealing   . Thyroid nodule     Family History  Problem Relation Age of Onset  . CAD Father        CABG   . CAD Brother        CABG in 74s  . CAD Brother   . Arthritis/Rheumatoid Mother   . Hyperlipidemia Mother   . Thrombocytopenia Mother   . COPD Son        smoker  . Alzheimer's disease Maternal Grandmother   . Lung disease Paternal Grandfather    Past Surgical History:  Procedure Laterality Date  . ANAL FISTULOTOMY N/A 06/28/2013   Procedure: ANAL FISTULOTOMY;  Surgeon: [name], MD;  Location: [facility];  Service: General;  Laterality: N/A;  . ANAL FISTULOTOMY  2017  . ANTERIOR CERVICAL DECOMP/DISCECTOMY FUSION N/A 11/12/2015   Procedure: ANTERIOR CERVICAL DECOMPRESSION/DISCECTOMY FUSION CERVICAL THREE- CERVICAL FOUR, CERVICAL FOUR- CERVICAL FIVE;  Surgeon:  Jovita Gamma, MD;  Location: [facility];  Service: Neurosurgery;  Laterality: N/A;  ANTERIOR CERVICAL DECOMPRESSION/DISCECTOMY FUSION C3-C4, C4-C5  . BIOPSY THYROID    . CARDIAC CATHETERIZATION  1994, 12/25/2007   a. PTCA alone in 1994 b. RCA CTO w/ L-R collaterals, 40-50% prox RCA, 20% prox LAD; EF 50-55%, inferoapical HK  . CARDIAC CATHETERIZATION  04/02/2001  . CARDIAC CATHETERIZATION  11/07/2000  . CARDIAC CATHETERIZATION  09/24/1996  . CARPAL TUNNEL RELEASE Left 10/09/2014   Procedure: LEFT CARPAL TUNNEL RELEASE;  Surgeon: [name], MD;  Location: [facility];  Service: Orthopedics;  Laterality: Left;  . CARPAL TUNNEL RELEASE Right 08/28/2003  . CAUTERY OF TURBINATES  11/05/2003  . COLONOSCOPY N/A 11/06/2014   RMR: Skipped Colonic ulcerations with significant ileocecal valve involvement and rectal sparing most consisitant with Crohns disease. Minimal non-steroidal drug use. Probable colonic lipoma  . CYSTECTOMY    . INCISION AND DRAINAGE ABSCESS Right 05/23/2012   Procedure: INCISION AND DRAINAGE ABSCESS;  Surgeon: [name], MD;  Location:  [facility];  Service: General;  Laterality: Right;  . KNEE ARTHROSCOPY    . LITHOTRIPSY    . LUMBAR FUSION  02/18/2002  . LUMBAR LAMINECTOMY  02/18/2002   L4-5  . NASAL SEPTUM SURGERY  11/05/2003  . PARTIAL NEPHRECTOMY Left    age 22  . SHOULDER ARTHROSCOPY WITH SUBACROMIAL DECOMPRESSION, ROTATOR CUFF REPAIR AND BICEP TENDON REPAIR Left 11/30/2016   Procedure: LEFT SHOULDER ARTHROSCOPY, DEBRIDEMENT, ACROMIOPLASTY WITH DISTAL CLAVICAL EXCISION, POSSIBLE ROTATOR CUFF REPAIR AND BICEP TENODESIS;  Surgeon: Ninetta Lights, MD;  Location: [facility];  Service: Orthopedics;  Laterality: Left;  . SPLENECTOMY     age 73  . TRANSTHORACIC ECHOCARDIOGRAM  03/05/2009   EF >55%, normal LV wall thickness.   Social History   Social History Narrative   Lives in [place], [name]. Married, 2 children.     There is no immunization history on file for this  patient.   Objective: Vital Signs: BP (!) 143/69 (BP Location: Right Arm, Patient Position: Sitting, Cuff Size: Normal)   Pulse 76   Resp 17   Ht 5\' 8"  (1.727 m)   Wt 211 lb (95.7 kg)   BMI 32.08 kg/m    Physical Exam HENT:     Right Ear: External ear normal.     Left Ear: External ear normal.     Mouth/Throat:     Mouth: Mucous membranes are moist.     Pharynx: Oropharynx is clear.  Eyes:     Conjunctiva/sclera: Conjunctivae normal.  Cardiovascular:     Rate and Rhythm: Normal rate and regular rhythm.  Pulmonary:     Effort: Pulmonary effort is normal.     Breath sounds: Normal breath sounds.  Skin:    General: Skin is warm and dry.     Findings: Rash present.     Comments: Patchy flat pink scaly rashes on the flexor surface of forearms bilaterally  Neurological:     General: No focal deficit present.     Mental Status: He is alert.  Psychiatric:     Comments: Flat affect     Musculoskeletal Exam:  Neck reduced range of motion in left lateral rotation Shoulder bilateral stiffness with abduction overhead reduced range of motion in right shoulder abduction no tenderness or swelling Elbow, wrist full range of motion no swelling Fingers with extensive Heberden's nodes PIP and DIP joints bilaterally some rotation and deviations, digital clubbing present, some fingers with remote amputation of distal finger pad no synovitis Increase spinal kyphosis Knees full range of motion, no swelling, patellofemoral crepitus bilaterally    Investigation: No additional findings.  Imaging: No results found.  Recent Labs: Lab Results  Component Value Date   WBC 5.8 12/07/2019   HGB 13.8 12/07/2019   PLT 507 (H) 12/07/2019   NA 138 12/07/2019   K 3.1 (L) 12/07/2019   CL 104 12/07/2019   CO2 24 12/07/2019   GLUCOSE 99 12/07/2019   BUN 5 (L) 12/07/2019   CREATININE 0.69 12/07/2019   BILITOT 0.6 11/13/2019   ALKPHOS 55 11/13/2019   AST 42 (H) 11/13/2019   ALT 35 11/13/2019    PROT 6.8 11/13/2019   ALBUMIN 3.3 (L) 11/13/2019   CALCIUM 8.7 (L) 12/07/2019   GFRAA 104 03/16/2017    Speciality Comments: No specialty comments available.  Procedures:  No procedures performed Allergies: Niacin, Niacin and related, Other, Penicillins, and Statins   Assessment / Plan:     Visit Diagnoses: Primary osteoarthritis of both hands  History and  exam are consistent with degenerative arthritis in bilateral hands.  Also has shoulder pain worse on the left but range of motion is worse on the right.  Cervical spine range of motion decreased and increased kyphosis in thoracic spine.  Review of previous imaging from the past 5 years shows extensive anterior and right-sided vertebral osteophytes and no reported sacroiliitis.  This pattern is more consistent with hyperostosis or degenerative disease versus being IBD associated disease.  Not a good candidate for long-term use of NSAIDs due to comorbidities.  Discussed topical anti-inflammatory or analgesic creams, compression gloves, hot and cold treatment, referral to occupational therapy for evaluation and exercise recommendations. Suspect symptoms are worse due to cold temperatures, recent illness, and deconditioning due to less activity with the reported anhedonia.  Trigger finger, left middle finger Trigger finger, unspecified finger, unspecified laterality  Palpable flexor tendon nodules around third and fourth right digits and third left digit with some tenderness and he reports intermittent triggering.  Discussed options including splinting versus local steroid injection currently seems fairly mild and not the main contributor to complaints so can follow-up as needed.  Dry mouth  Chronic dry mouth no severe dryness seen on exam no history of accelerated dental decay.  Recommend symptomatic management with nonpharmacological aids at this time.  Reviewed medication list no medications that associate chronologically with dryness.    Orders: Orders Placed This Encounter  Procedures  . Ambulatory referral to Occupational Therapy   No orders of the defined types were placed in this encounter.   Follow-Up Instructions: Return if symptoms worsen or fail to improve.   Collier Salina, MD  Note - This record has been created using [employer].  Chart creation errors have been sought, but may not always  have been located. Such creation errors do not reflect on  the standard of medical care.

## 2020-02-17 ENCOUNTER — Ambulatory Visit: Payer: Medicare Other | Admitting: Internal Medicine

## 2020-02-17 ENCOUNTER — Other Ambulatory Visit: Payer: Self-pay

## 2020-02-17 ENCOUNTER — Encounter: Payer: Self-pay | Admitting: Internal Medicine

## 2020-02-17 VITALS — BP 143/69 | HR 76 | Resp 17 | Ht 68.0 in | Wt 211.0 lb

## 2020-02-17 DIAGNOSIS — R4584 Anhedonia: Secondary | ICD-10-CM | POA: Insufficient documentation

## 2020-02-17 DIAGNOSIS — R682 Dry mouth, unspecified: Secondary | ICD-10-CM | POA: Diagnosis not present

## 2020-02-17 DIAGNOSIS — M19041 Primary osteoarthritis, right hand: Secondary | ICD-10-CM

## 2020-02-17 DIAGNOSIS — M19042 Primary osteoarthritis, left hand: Secondary | ICD-10-CM | POA: Diagnosis not present

## 2020-02-17 DIAGNOSIS — M6530 Trigger finger, unspecified finger: Secondary | ICD-10-CM | POA: Diagnosis not present

## 2020-02-17 DIAGNOSIS — M65332 Trigger finger, left middle finger: Secondary | ICD-10-CM | POA: Diagnosis not present

## 2020-02-17 NOTE — Patient Instructions (Signed)
I believe your current symptoms are due to osteoarthritis which is loss of the joint cartilage and formation of bony nodules. Taking antiinflammatory medications long term is not a good option due to your heart and gastrointestinal history. Recommended treatments would include:  Topical cream or ointments for hand pain Soft compression gloves Heat or cold treatment Occupational therapy   You do have trigger finger of several fingers on the right and on the left hand. These can be treated with steroid injection if they are becoming more painful or more difficult to straighten when they are triggering.  For dry mouth I recommend trying sugar free lozenges or gum or biotene mouth spray. I do not see current reason to go on prescription medication for this.

## 2020-02-24 ENCOUNTER — Ambulatory Visit (HOSPITAL_COMMUNITY): Payer: Medicare Other

## 2020-02-25 ENCOUNTER — Other Ambulatory Visit: Payer: Self-pay

## 2020-02-25 ENCOUNTER — Ambulatory Visit (HOSPITAL_COMMUNITY): Payer: Medicare Other | Attending: Internal Medicine

## 2020-02-25 ENCOUNTER — Encounter (HOSPITAL_COMMUNITY): Payer: Self-pay

## 2020-02-25 DIAGNOSIS — M25542 Pain in joints of left hand: Secondary | ICD-10-CM | POA: Diagnosis not present

## 2020-02-25 DIAGNOSIS — M25541 Pain in joints of right hand: Secondary | ICD-10-CM | POA: Insufficient documentation

## 2020-02-25 DIAGNOSIS — R29898 Other symptoms and signs involving the musculoskeletal system: Secondary | ICD-10-CM | POA: Diagnosis not present

## 2020-02-25 NOTE — Therapy (Signed)
[place], [name], [address] Phone: [phone]   Fax:  [phone]  Occupational Therapy Evaluation  Patient Details  Name: [name] MRN: [id number] Date of Birth: [birth year] Referring Provider (OT): Vernelle Emerald, MD   Encounter Date: 02/25/2020   OT End of Session - 02/25/20 1427    Visit Number 1    Number of Visits 1    Authorization Type [employer] $30 copay no visit limit    OT Start Time 1305    OT Stop Time 1344    OT Time Calculation (min) 39 min    Activity Tolerance Patient tolerated treatment well    Behavior During Therapy [facility] for tasks assessed/performed           Past Medical History:  Diagnosis Date  . Anxiety   . Articular cartilage disorder involving shoulder region, left 11/2016  . Bursitis of shoulder, left 11/2016  . Complication of anesthesia    hx. of heart rate dropping during 2 surgeries   (2004- back surgery and with Lithotrispy )  . Crohn's disease ([place])   . Depression   . Dyspnea    with exertion  . History of hepatitis    unknown type - 1970s  . History of kidney stones   . Hyperlipidemia    unable to tolerat statins  . Hypertension    states under control with meds., has been on med. since 1994  . Immature cataract of both eyes   . Limited joint range of motion    cervical spine  . Myocardial infarct ([place]) 09/13/1992  . Nonobstructive atherosclerosis of coronary artery   . Numbness in both hands   . Obesity   . Osteoarthritis 11/2016   left shoulder  . Rotator cuff tear, left 11/2016  . Sleep apnea    uses BiPAP, but having issues with mask not sealing   . Thyroid nodule     Past Surgical History:  Procedure Laterality Date  . ANAL FISTULOTOMY N/A 06/28/2013   Procedure: ANAL FISTULOTOMY;  Surgeon: [name], MD;  Location: [facility];  Service: General;  Laterality: N/A;  . ANAL FISTULOTOMY  2017  . ANTERIOR CERVICAL DECOMP/DISCECTOMY FUSION N/A  11/12/2015   Procedure: ANTERIOR CERVICAL DECOMPRESSION/DISCECTOMY FUSION CERVICAL THREE- CERVICAL FOUR, CERVICAL FOUR- CERVICAL FIVE;  Surgeon: Jovita Gamma, MD;  Location: [place];  Service: Neurosurgery;  Laterality: N/A;  ANTERIOR CERVICAL DECOMPRESSION/DISCECTOMY FUSION C3-C4, C4-C5  . BIOPSY THYROID    . CARDIAC CATHETERIZATION  1994, 12/25/2007   a. PTCA alone in 1994 b. RCA CTO w/ L-R collaterals, 40-50% prox RCA, 20% prox LAD; EF 50-55%, inferoapical HK  . CARDIAC CATHETERIZATION  04/02/2001  . CARDIAC CATHETERIZATION  11/07/2000  . CARDIAC CATHETERIZATION  09/24/1996  . CARPAL TUNNEL RELEASE Left 10/09/2014   Procedure: LEFT CARPAL TUNNEL RELEASE;  Surgeon: [name], MD;  Location: [place];  Service: Orthopedics;  Laterality: Left;  . CARPAL TUNNEL RELEASE Right 08/28/2003  . CAUTERY OF TURBINATES  11/05/2003  . COLONOSCOPY N/A 11/06/2014   RMR: Skipped Colonic ulcerations with significant ileocecal valve involvement and rectal sparing most consisitant with Crohns disease. Minimal non-steroidal drug use. Probable colonic lipoma  . CYSTECTOMY    . INCISION AND DRAINAGE ABSCESS Right 05/23/2012   Procedure: INCISION AND DRAINAGE ABSCESS;  Surgeon: [name], MD;  Location: [facility];  Service: General;  Laterality: Right;  . KNEE ARTHROSCOPY    . LITHOTRIPSY    .  LUMBAR FUSION  02/18/2002  . LUMBAR LAMINECTOMY  02/18/2002   L4-5  . NASAL SEPTUM SURGERY  11/05/2003  . PARTIAL NEPHRECTOMY Left    age 12  . SHOULDER ARTHROSCOPY WITH SUBACROMIAL DECOMPRESSION, ROTATOR CUFF REPAIR AND BICEP TENDON REPAIR Left 11/30/2016   Procedure: LEFT SHOULDER ARTHROSCOPY, DEBRIDEMENT, ACROMIOPLASTY WITH DISTAL CLAVICAL EXCISION, POSSIBLE ROTATOR CUFF REPAIR AND BICEP TENODESIS;  Surgeon: Ninetta Lights, MD;  Location: [place];  Service: Orthopedics;  Laterality: Left;  . SPLENECTOMY     age 28  . TRANSTHORACIC ECHOCARDIOGRAM  03/05/2009   EF >55%, normal LV wall thickness.     There were no vitals filed for this visit.   Subjective Assessment - 02/25/20 1349    Subjective  S: I've been dealing with this a long time.    Pertinent History Patient is a 70 y/o male S/P OA of bilateral hands which has been ongoing for years in addition to pain and limited mobility in his neck and bilateral shoulders. Pt previously attended OT at this clinic after Left RTC repair and bicep tendon repair. Pt reports that his has used different types of compression gloves; one has provided minimal improvement with pain and discomfort but not much. Dr. Benjamine Mola recently saw patient for this issue and has recommended hot/cold treatment, compression gloves, and use of topical creams for pain. Dr. Benjamine Mola has referred patient to occupational therapy for evaluation and treatment if able.    Patient Stated Goals Anything that may help his hands.    Currently in Pain? Yes    Pain Score 4     Pain Location Hand    Pain Orientation Right;Left    Pain Descriptors / Indicators Aching    Pain Type Chronic pain    Pain Onset More than a month ago    Pain Frequency Constant    Aggravating Factors  increased use, repetitive gripping and pinching activities, cleaning tasks (afterwards there is pain), attempting to find a comfortable position to sleep    Pain Relieving Factors nothing really helps at this point    Effect of Pain on Daily Activities mod-max effect    Multiple Pain Sites No             [facility] OT Assessment - 02/25/20 1313      Assessment   Medical Diagnosis OA of both hands    Referring Provider (OT) Vernelle Emerald, MD    Onset Date/Surgical Date --   several years   Hand Dominance Right    Prior Therapy Pt received OP OT services for right shoulder post surgery      Precautions   Precautions None      Restrictions   Weight Bearing Restrictions No      Balance Screen   Has the patient fallen in the past 6 months No      Home  Environment   Family/patient expects to be  discharged to: Private residence      Prior Function   Level of Independence Independent      ADL   ADL comments Difficulty with all activities of daily living due to longstanding OA in hands. Specfically mentioned opening jars, riding the lawnmower, doing housecleaning. Difficulty with buttons due to lack of sensation in hands and uses a mirror for guidance.      Mobility   Mobility Status Independent      Written Expression   Dominant Hand Right      Vision - History   Baseline Vision  Wears glasses only for reading      Cognition   Overall Cognitive Status Within Functional Limits for tasks assessed      Observation/Other Assessments   Observations extensive Heberden's nodes PIP and DIP joints bilaterally some rotation and deviations, digital clubbing present, some fingers with remote amputation of distal finger pad    Outcome Measures DASH: 38.64      Posture/Postural Control   Posture/Postural Control Postural limitations    Postural Limitations Rounded Shoulders      Sensation   Additional Comments Pt reports decreased sensation in bilateral hands with right worse than left. Decreased sensation is present in fingers to wrists although will extend up into arms with increased use.      Coordination   Gross Motor Movements are Fluid and Coordinated No    Fine Motor Movements are Fluid and Coordinated No      ROM / Strength   AROM / PROM / Strength Strength      Strength   Strength Assessment Site Hand    Right/Left hand Right;Left    Right Hand Grip (lbs) 35    Right Hand Lateral Pinch 14 lbs    Right Hand 3 Point Pinch 10 lbs    Left Hand Grip (lbs) 30    Left Hand Lateral Pinch 11 lbs    Left Hand 3 Point Pinch 6 lbs               Katina Dung - 02/25/20 1314    Open a tight or new jar Severe difficulty    Do heavy household chores (wash walls, wash floors) Mild difficulty    Carry a shopping bag or briefcase Moderate difficulty    Wash your back No  difficulty    Use a knife to cut food No difficulty    Recreational activities in which you take some force or impact through your arm, shoulder, or hand (golf, hammering, tennis) Unable    During the past week, to what extent has your arm, shoulder or hand problem interfered with your normal social activities with family, friends, neighbors, or groups? Not at all    During the past week, to what extent has your arm, shoulder or hand problem limited your work or other regular daily activities Not at all    Arm, shoulder, or hand pain. Mild    Tingling (pins and needles) in your arm, shoulder, or hand Extreme    Difficulty Sleeping Moderate difficulty    DASH Score 38.64 %                      OT Education - 02/25/20 1418    Education Details Provided education, demonstration as able, and handout regarding joint protection when using bilateral hands during daily tasks. Two dycem pieces provided for opening jars with demonstration of use. Yellow putty provided for hand and joint mobility. Discussed motion of hands are important versus strengthening when living with arthritis. Provided information on purchasing a card holder for card playing as holding cards is difficult.    Person(s) Educated Patient    Methods Explanation;Demonstration;Verbal cues;Handout    Comprehension Verbalized understanding            OT Short Term Goals - 02/25/20 1546      OT SHORT TERM GOAL #1   Title Patient will be educated and voice understanding of HEP and education provided regarding joint protection which will aid with maintaining mobility of both hands and provide  protection of effected joints while completing daily tasks.    Time 1    Period Days    Status Achieved    Target Date 02/25/20                    Plan - 02/25/20 1449    Clinical Impression Statement A: patient is a 71 y/o male S/P OA of bilateral hands causing increase pain and decreased strength while attempting to  complete ADL tasks. Discussed the importance of movement and mobility of joints that experiencing arthritis and how the lack of will caused increased pain and stiffness. Reviewed joint protection of the hands when completing daily tasks. Provided education on hand exercises with putty and encouraged completing cervical and shoulder ROM exercises as well. All education was completed during evaluation with encouragement to contact clinic in the future if further questions or education is needed.    OT Occupational Profile and History Detailed Assessment- Review of Records and additional review of physical, cognitive, psychosocial history related to current functional performance    Occupational performance deficits (Please refer to evaluation for details): ADL's;Rest and Sleep;IADL's;Leisure    Body Structure / Function / Physical Skills ADL;UE functional use;Pain;Strength;Dexterity    Rehab Potential Excellent    Clinical Decision Making Limited treatment options, no task modification necessary    Comorbidities Affecting Occupational Performance: Presence of comorbidities impacting occupational performance    Comorbidities impacting occupational performance description: History of bilateral carpal tunnel release, left RTC repair and bicep tendon repair, anterior cervical decompression fusion C3-C4, C4-C5, chronic presence of OA in bilateral hands.    Modification or Assistance to Complete Evaluation  No modification of tasks or assist necessary to complete eval    OT Frequency One time visit    OT Treatment/Interventions Self-care/ADL training    Plan P: one time evaluation with education and HEP provided. patient will follow up with OT if needed in the future.    Consulted and Agree with Plan of Care Patient           Patient will benefit from skilled therapeutic intervention in order to improve the following deficits and impairments:   Body Structure / Function / Physical Skills: ADL,UE  functional use,Pain,Strength,Dexterity       Visit Diagnosis: Pain in joint of left hand - Plan: Ot plan of care cert/re-cert  Pain in joint of right hand - Plan: Ot plan of care cert/re-cert  Other symptoms and signs involving the musculoskeletal system - Plan: Ot plan of care cert/re-cert    Problem List Patient Active Problem List   Diagnosis Date Noted  . Trigger finger 02/17/2020  . Dry mouth 02/17/2020  . Anhedonia 02/17/2020  . Pneumonia due to COVID-19 virus 11/12/2019  . Numbness and tingling in both hands 07/17/2019  . Cervical spinal stenosis 11/12/2015  . Crohn disease ([place]) 01/20/2015  . Rectal fistula 01/20/2015  . Noninfectious gastroenteritis, unspecified   . Rectal bleeding 10/21/2014  . Bowel habit changes 10/21/2014  . Obesity (BMI 30.0-34.9) 05/18/2014  . OSA (obstructive sleep apnea) 03/30/2013  . Chest pain 02/15/2013  . HTN (hypertension) 02/15/2013  . CAD (coronary artery disease) 02/15/2013  . Dyslipidemia 02/15/2013  . Osteoarthrosis, hand 12/01/2008   Ailene Ravel, OTR/L,CBIS  [phone]  02/25/2020, 3:49 PM  [place] [address] [place], [name], [address] Phone: [phone]   Fax:  [phone]  Name: [name] MRN: [id number] Date of Birth: April 06, 1950

## 2020-03-09 DIAGNOSIS — I10 Essential (primary) hypertension: Secondary | ICD-10-CM | POA: Diagnosis not present

## 2020-03-09 DIAGNOSIS — E7849 Other hyperlipidemia: Secondary | ICD-10-CM | POA: Diagnosis not present

## 2020-04-06 DIAGNOSIS — E7849 Other hyperlipidemia: Secondary | ICD-10-CM | POA: Diagnosis not present

## 2020-04-06 DIAGNOSIS — I10 Essential (primary) hypertension: Secondary | ICD-10-CM | POA: Diagnosis not present

## 2020-05-05 ENCOUNTER — Ambulatory Visit: Payer: Medicare Other | Admitting: Cardiovascular Disease

## 2020-06-16 DIAGNOSIS — H04123 Dry eye syndrome of bilateral lacrimal glands: Secondary | ICD-10-CM | POA: Diagnosis not present

## 2020-07-07 DIAGNOSIS — I10 Essential (primary) hypertension: Secondary | ICD-10-CM | POA: Diagnosis not present

## 2020-07-07 DIAGNOSIS — E7849 Other hyperlipidemia: Secondary | ICD-10-CM | POA: Diagnosis not present

## 2020-08-13 DIAGNOSIS — E74818 Other disorders of glucose transport: Secondary | ICD-10-CM | POA: Diagnosis not present

## 2020-08-13 DIAGNOSIS — K5090 Crohn's disease, unspecified, without complications: Secondary | ICD-10-CM | POA: Diagnosis not present

## 2020-08-13 DIAGNOSIS — E782 Mixed hyperlipidemia: Secondary | ICD-10-CM | POA: Diagnosis not present

## 2020-08-13 DIAGNOSIS — E039 Hypothyroidism, unspecified: Secondary | ICD-10-CM | POA: Diagnosis not present

## 2020-08-13 DIAGNOSIS — Z1389 Encounter for screening for other disorder: Secondary | ICD-10-CM | POA: Diagnosis not present

## 2020-08-13 DIAGNOSIS — Z0001 Encounter for general adult medical examination with abnormal findings: Secondary | ICD-10-CM | POA: Diagnosis not present

## 2020-08-17 ENCOUNTER — Other Ambulatory Visit (HOSPITAL_COMMUNITY): Payer: Self-pay | Admitting: Internal Medicine

## 2020-08-17 ENCOUNTER — Other Ambulatory Visit: Payer: Self-pay | Admitting: Internal Medicine

## 2020-08-17 DIAGNOSIS — N50812 Left testicular pain: Secondary | ICD-10-CM

## 2020-08-19 ENCOUNTER — Ambulatory Visit (HOSPITAL_COMMUNITY)
Admission: RE | Admit: 2020-08-19 | Discharge: 2020-08-19 | Disposition: A | Discharge status: Home / Self Care | Payer: Medicare Other | Source: Ambulatory Visit | Attending: Internal Medicine | Admitting: Internal Medicine

## 2020-08-19 ENCOUNTER — Other Ambulatory Visit: Payer: Self-pay

## 2020-08-19 DIAGNOSIS — N50812 Left testicular pain: Secondary | ICD-10-CM | POA: Diagnosis not present

## 2020-11-03 DIAGNOSIS — E559 Vitamin D deficiency, unspecified: Secondary | ICD-10-CM | POA: Diagnosis not present

## 2020-11-03 DIAGNOSIS — G629 Polyneuropathy, unspecified: Secondary | ICD-10-CM | POA: Diagnosis not present

## 2020-11-03 DIAGNOSIS — G894 Chronic pain syndrome: Secondary | ICD-10-CM | POA: Diagnosis not present

## 2020-11-03 DIAGNOSIS — M2550 Pain in unspecified joint: Secondary | ICD-10-CM | POA: Diagnosis not present

## 2020-11-03 DIAGNOSIS — M1991 Primary osteoarthritis, unspecified site: Secondary | ICD-10-CM | POA: Diagnosis not present

## 2020-11-03 DIAGNOSIS — M159 Polyosteoarthritis, unspecified: Secondary | ICD-10-CM | POA: Diagnosis not present

## 2020-11-03 DIAGNOSIS — G4730 Sleep apnea, unspecified: Secondary | ICD-10-CM | POA: Diagnosis not present

## 2020-11-03 DIAGNOSIS — I10 Essential (primary) hypertension: Secondary | ICD-10-CM | POA: Diagnosis not present

## 2020-12-07 DIAGNOSIS — E782 Mixed hyperlipidemia: Secondary | ICD-10-CM | POA: Diagnosis not present

## 2020-12-07 DIAGNOSIS — I10 Essential (primary) hypertension: Secondary | ICD-10-CM | POA: Diagnosis not present

## 2020-12-18 DIAGNOSIS — G629 Polyneuropathy, unspecified: Secondary | ICD-10-CM | POA: Insufficient documentation

## 2020-12-18 DIAGNOSIS — E559 Vitamin D deficiency, unspecified: Secondary | ICD-10-CM | POA: Diagnosis not present

## 2020-12-18 DIAGNOSIS — G5602 Carpal tunnel syndrome, left upper limb: Secondary | ICD-10-CM | POA: Diagnosis not present

## 2020-12-18 DIAGNOSIS — G5622 Lesion of ulnar nerve, left upper limb: Secondary | ICD-10-CM | POA: Diagnosis not present

## 2020-12-18 DIAGNOSIS — G5601 Carpal tunnel syndrome, right upper limb: Secondary | ICD-10-CM | POA: Insufficient documentation

## 2020-12-18 DIAGNOSIS — Z79899 Other long term (current) drug therapy: Secondary | ICD-10-CM | POA: Diagnosis not present

## 2020-12-22 DIAGNOSIS — E559 Vitamin D deficiency, unspecified: Secondary | ICD-10-CM | POA: Diagnosis not present

## 2020-12-22 DIAGNOSIS — Z79899 Other long term (current) drug therapy: Secondary | ICD-10-CM | POA: Diagnosis not present

## 2021-01-21 DIAGNOSIS — G5602 Carpal tunnel syndrome, left upper limb: Secondary | ICD-10-CM | POA: Diagnosis not present

## 2021-01-21 DIAGNOSIS — G5601 Carpal tunnel syndrome, right upper limb: Secondary | ICD-10-CM | POA: Diagnosis not present

## 2021-02-17 ENCOUNTER — Other Ambulatory Visit: Payer: Self-pay

## 2021-02-17 ENCOUNTER — Other Ambulatory Visit: Payer: Self-pay | Admitting: Orthopedic Surgery

## 2021-02-17 ENCOUNTER — Ambulatory Visit: Payer: Medicare Other | Admitting: Orthopedic Surgery

## 2021-02-17 ENCOUNTER — Encounter: Payer: Self-pay | Admitting: Orthopedic Surgery

## 2021-02-17 VITALS — BP 159/93 | HR 72 | Ht 69.0 in | Wt 224.0 lb

## 2021-02-17 DIAGNOSIS — G5601 Carpal tunnel syndrome, right upper limb: Secondary | ICD-10-CM

## 2021-02-17 DIAGNOSIS — G5602 Carpal tunnel syndrome, left upper limb: Secondary | ICD-10-CM

## 2021-02-17 DIAGNOSIS — I2510 Atherosclerotic heart disease of native coronary artery without angina pectoris: Secondary | ICD-10-CM | POA: Diagnosis not present

## 2021-02-17 DIAGNOSIS — G5603 Carpal tunnel syndrome, bilateral upper limbs: Secondary | ICD-10-CM

## 2021-02-17 NOTE — Progress Notes (Signed)
Patient ID: [name], male   DOB: [birth year], 71 y.o.   MRN: [id number]  ASSESSMENT AND PLAN   BOTH CARPAL TUNNELS NEED TO BE RELEASED; LEFT ONE FIRST   Chief Complaint  Patient presents with   Hand Pain    Right worse than left swollen, numbness     HPI [name] is a 71 y.o. male.  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  He had nerve conduction studies done at Dr. Freddie Apley office which were positive for bilateral carpal tunnel syndrome  He has typical complaints of numbness tingling pain swelling lack of fine motor function.  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 of heart rate dropping during 2 surgeries   (2004- back surgery and with Lithotrispy )   Crohn's disease ([place])    Depression    Dyspnea    with exertion   History of hepatitis    unknown type - 1970s   History of kidney stones    Hyperlipidemia    unable to tolerat statins   Hypertension    states under control with meds., has been on med. since 1994   Immature cataract of both eyes    Limited joint range of motion    cervical spine   Myocardial infarct ([place]) 09/13/1992   Nonobstructive atherosclerosis of coronary artery    Numbness in both hands    Obesity    Osteoarthritis 11/2016   left shoulder   Rotator cuff tear, left 11/2016   Sleep apnea    uses BiPAP, but having issues with mask not sealing    Thyroid nodule     Past Surgical History:  Procedure Laterality Date   ANAL FISTULOTOMY N/A  06/28/2013   Procedure: ANAL FISTULOTOMY;  Surgeon: [name], MD;  Location: [facility];  Service: General;  Laterality: N/A;   ANAL FISTULOTOMY  2017   ANTERIOR CERVICAL DECOMP/DISCECTOMY FUSION N/A 11/12/2015   Procedure: ANTERIOR CERVICAL DECOMPRESSION/DISCECTOMY FUSION CERVICAL THREE- CERVICAL FOUR, CERVICAL FOUR- CERVICAL FIVE;  Surgeon: Jovita Gamma, MD;  Location: [place];  Service: Neurosurgery;  Laterality: N/A;  ANTERIOR CERVICAL DECOMPRESSION/DISCECTOMY FUSION C3-C4, C4-C5   BIOPSY THYROID     CARDIAC CATHETERIZATION  1994, 12/25/2007   a. PTCA alone in 1994 b. RCA CTO w/ L-R collaterals, 40-50% prox RCA, 20% prox LAD; EF 50-55%, inferoapical HK   CARDIAC CATHETERIZATION  04/02/2001   CARDIAC CATHETERIZATION  11/07/2000   CARDIAC CATHETERIZATION  09/24/1996   CARPAL TUNNEL RELEASE Left 10/09/2014   Procedure: LEFT CARPAL TUNNEL RELEASE;  Surgeon: [name], MD;  Location: [facility];  Service: Orthopedics;  Laterality: Left;   CARPAL TUNNEL RELEASE Right 08/28/2003   CAUTERY OF TURBINATES  11/05/2003   COLONOSCOPY N/A 11/06/2014   RMR: Skipped Colonic ulcerations with significant ileocecal valve involvement and rectal sparing most consisitant with Crohns disease. Minimal non-steroidal drug use. Probable colonic lipoma   CYSTECTOMY  INCISION AND DRAINAGE ABSCESS Right 05/23/2012   Procedure: INCISION AND DRAINAGE ABSCESS;  Surgeon: [name], MD;  Location: [facility];  Service: General;  Laterality: Right;   KNEE ARTHROSCOPY     LITHOTRIPSY     LUMBAR FUSION  02/18/2002   LUMBAR LAMINECTOMY  02/18/2002   L4-5   NASAL SEPTUM SURGERY  11/05/2003   PARTIAL NEPHRECTOMY Left    age 64   SHOULDER ARTHROSCOPY WITH SUBACROMIAL DECOMPRESSION, ROTATOR CUFF REPAIR AND BICEP TENDON REPAIR Left 11/30/2016   Procedure: LEFT SHOULDER ARTHROSCOPY, DEBRIDEMENT, ACROMIOPLASTY WITH DISTAL CLAVICAL EXCISION, POSSIBLE ROTATOR CUFF REPAIR AND BICEP TENODESIS;  Surgeon: Ninetta Lights, MD;  Location: [place];  Service: Orthopedics;  Laterality: Left;   SPLENECTOMY     age 66   TRANSTHORACIC ECHOCARDIOGRAM  03/05/2009   EF >55%, normal LV wall thickness.    Family History  Problem Relation Age of Onset   CAD Father        CABG    CAD Brother        CABG in 43s   CAD Brother    Arthritis/Rheumatoid Mother    Hyperlipidemia Mother    Thrombocytopenia Mother    COPD Son        smoker   Alzheimer's disease Maternal Grandmother    Lung disease Paternal Grandfather      Social History   Tobacco Use   Smoking status: Former    Packs/day: 2.00    Years: 18.00    Pack years: 36.00    Types: Cigarettes    Quit date: 02/06/1993    Years since quitting: 28.0   Smokeless tobacco: Former   Tobacco comments:    Chew twice yearly  Vaping Use   Vaping Use: Never used  Substance Use Topics   Alcohol use: No    Alcohol/week: 0.0 standard drinks   Drug use: No    Allergies  Allergen Reactions   Niacin Other (See Comments)    FLUSHING   Niacin And Related Other (See Comments)    FLUSHING   Other Other (See Comments)    MYALGIAS   Penicillins Hives, Itching and Other (See Comments)    Has patient had a PCN reaction causing immediate rash, facial/tongue/throat swelling, SOB or lightheadedness with hypotension: Unknown Has patient had a PCN reaction causing severe rash involving mucus membranes or skin necrosis: Unknown Has patient had a PCN reaction that required hospitalization: Unknown Has patient had a PCN reaction occurring within the last 10 years: No If all of the above answers are "NO", then may proceed with Cephalosporin use.  Has patient had a PCN reaction causing immediate rash, facial/tongue/throat swelling, SOB or lightheadedness with hypotension: Unknown Has patient had a PCN reaction causing severe rash involving mucus membranes or skin necrosis: Unknown Has patient had a PCN reaction that required hospitalization: Unknown Has patient had a PCN  reaction occurring within the last 10 years: No If all of the above answers are "NO", then may proceed with Cephalosporin use.   Statins Other (See Comments)    MYALGIAS    Current Outpatient Medications  Medication Sig Dispense Refill   albuterol (VENTOLIN HFA) 108 (90 Base) MCG/ACT inhaler Inhale 2 puffs into the lungs every 4 (four) hours as needed for wheezing or shortness of breath (cough). 18 g 1   ALPRAZolam (XANAX) 0.5 MG tablet Take 1 tablet (0.5 mg total) by mouth 3 (three) times daily as needed (dizziness). 21 tablet 0   amLODipine (NORVASC)  5 MG tablet Take 1 tablet (5 mg total) by mouth daily. 180 tablet 3   ascorbic acid (VITAMIN C) 500 MG tablet Take 1 tablet (500 mg total) by mouth daily. 30 tablet 2   guaiFENesin-dextromethorphan (ROBITUSSIN DM) 100-10 MG/5ML syrup Take 10 mLs by mouth every 4 (four) hours as needed for cough. 118 mL 0   loperamide (IMODIUM) 2 MG capsule Take 1 capsule (2 mg total) by mouth every 12 (twelve) hours as needed for diarrhea or loose stools. 10 capsule 0   MELATONIN PO Take by mouth.     metoprolol succinate (TOPROL-XL) 50 MG 24 hr tablet Take 50 mg by mouth daily. Take with or immediately following a meal.     naproxen (NAPROSYN) 375 MG tablet Take 1 tablet (375 mg total) by mouth 2 (two) times daily. Take with food 10 tablet 0   nitroGLYCERIN (NITROSTAT) 0.4 MG SL tablet Place 1 tablet (0.4 mg total) under the tongue every 5 (five) minutes as needed for chest pain. 25 tablet 3   ondansetron (ZOFRAN) 4 MG tablet Take 1 tablet (4 mg total) by mouth every 6 (six) hours as needed for nausea. 20 tablet 0   Probiotic Product (PROBIOTIC DAILY PO) Take 1 capsule by mouth daily. SUPREMA DOPHILUS     Psyllium Husk POWD Take 12 g by mouth daily. INTO 12 OUNCES OF WATER (1 TABLESPOON)     ramipril (ALTACE) 10 MG capsule Take 1 capsule (10 mg total) by mouth daily. Please do NOT  Restart Ramipril until 12/09/2019 30 capsule 0   traZODone (DESYREL) 50 MG tablet  Take by mouth.     zinc sulfate 220 (50 Zn) MG capsule Take 1 capsule (220 mg total) by mouth daily. 30 capsule 0   ezetimibe (ZETIA) 10 MG tablet Take 1 tablet (10 mg total) by mouth daily. 90 tablet 3   No current facility-administered medications for this visit.     Physical Exam BP (!) 159/93    Pulse 72    Ht 5\' 9"  (1.753 m)    Wt 224 lb (101.6 kg)    BMI 33.08 kg/m   The patient is well developed well nourished and well groomed.  Orientation to person place and time is normal  Mood is pleasant.  Ambulatory status normal gait and stance   Right upper extremity examination reveals the following:  SWELLING yes TENDERNESS OVER THE CARPAL TUNNEL yes  Range of motion of the wrist normal  Motor exam normal  Wrist joint is stable  Provocative tests for carpal tunnel Phalen's test positive Carpal tunnel compression test positive Tinel's test negative  Pulses are normal in the radial and ulnar artery with a normal Allen's test.  SENSORY EXAM:   Left upper extremity upper extremity examination reveals the following:  SWELLING yes TENDERNESS OVER THE CARPAL TUNNEL yes  Range of motion of the wrist normal  Motor exam normal  Wrist joint is stable  Provocative tests for carpal tunnel Phalen's test positive Carpal tunnel compression test positive Tinel's test negative  Pulses are normal in the radial and ulnar artery with a normal Allen's test.   Plan   MEDICAL DECISION SECTION   XRAYS: none  Encounter Diagnoses  Name Primary?   Carpal tunnel syndrome of right wrist Yes   Carpal tunnel syndrome of left wrist      PLAN:  The procedure has been fully reviewed with the patient; The risks and benefits of surgery have been discussed and explained and understood.  Alternative treatment has also been reviewed, questions were encouraged and answered. The postoperative plan is also been reviewed.  LEFT CTR TO BE F/B RT CTR

## 2021-02-17 NOTE — Addendum Note (Signed)
Addended byCandice Camp on: 02/17/2021 02:12 PM   Modules accepted: Orders

## 2021-02-17 NOTE — Patient Instructions (Addendum)
Your surgery will be at [facility] by Dr Aline Brochure  The hospital will contact you with a preoperative appointment to discuss Anesthesia. The phone number is [phone]  Please bring your medications with you for the appointment. They will tell you the arrival time and medication instructions when you have your preoperative evaluation. Do not wear nail polish the day of your surgery and if you take Phentermine you need to stop this medication ONE WEEK prior to your surgery.

## 2021-02-25 NOTE — Patient Instructions (Addendum)
[name]  02/25/2021     @PREFPERIOPPHARMACY @   Your procedure is scheduled on  03/02/2021.   Report to [facility] at  [facility]   Call this number if you have problems the morning of surgery:  [phone]   Remember:  Do not eat or drink after midnight.      Take these medicines the morning of surgery with A SIP OF WATER                            amlodipine, metoprolol.     Do not wear jewelry, make-up or nail polish.  Do not wear lotions, powders, or perfumes, or deodorant.  Do not shave 48 hours prior to surgery.  Men may shave face and neck.  Do not bring valuables to the hospital.  [facility] is not responsible for any belongings or valuables.  Contacts, dentures or bridgework may not be worn into surgery.  Leave your suitcase in the car.  After surgery it may be brought to your room.  For patients admitted to the hospital, discharge time will be determined by your treatment team.  Patients discharged the day of surgery will not be allowed to drive home and must have someone with them for 24 hours.    Special instructions:   DO NOT smoke tobacco or vape for 24 hours before your procedure.  Please read over the following fact sheets that you were given. Coughing and Deep Breathing, Surgical Site Infection Prevention, Anesthesia Post-op Instructions, and Care and Recovery After Surgery       Open Carpal Tunnel Release, Care After This sheet gives you information about how to care for yourself after your procedure. Your health care provider may also give you more specific instructions. If you have problems or questions, contact your health care provider. What can I expect after the procedure? After the procedure, it is common to have: Pain. Swelling. Wrist stiffness. Bruising. Follow these instructions at home: Medicines Take over-the-counter and prescription medicines only as told by your health care provider. Ask your health care provider  if the medicine prescribed to you: Requires you to avoid driving or using machinery. Can cause constipation. You may need to take these actions to prevent or treat constipation: Drink enough fluid to keep your urine pale yellow. Take over-the-counter or prescription medicines. Eat foods that are high in fiber, such as beans, whole grains, and fresh fruits and vegetables. Limit foods that are high in fat and processed sugars, such as fried or sweet foods. Bathing Do not take baths, swim, or use a hot tub until your health care provider approves. Ask your health care provider if you may take showers. Keep your bandage (dressing) dry until your health care provider says it can be removed. Cover it with a watertight covering when you take a bath or a shower. If you have a splint or brace: Wear the splint or brace as told by your health care provider. You may need to wear it for 2-3 weeks. Remove it only as told by your health care provider. Loosen the splint or brace if your fingers tingle, become numb, or turn cold and blue. Keep the splint or brace clean. If the splint or brace is not waterproof: Do not let it get wet. Cover it with a watertight covering when you take a bath or a shower. Incision care  After the compression  bandage has been removed, follow instructions from your health care provider about how to take care of your incision. Make sure you: Wash your hands with soap and water for at least 20 seconds before and after you change your bandage (dressing). If soap and water are not available, use hand sanitizer. Change your dressing as told by your health care provider. Leave stitches (sutures), skin glue, or adhesive strips in place. These skin closures may need to stay in place for 2 weeks or longer. If adhesive strip edges start to loosen and curl up, you may trim the loose edges. Do not remove adhesive strips completely unless your health care provider tells you to do that. Check  your incision area every day for signs of infection. Check for: Redness. More swelling or pain. Fluid or blood. Warmth. Pus or a bad smell. Managing pain, stiffness, and swelling  If directed, put ice on the affected area. If you have a removable splint or brace, remove it as told by your health care provider. Put ice in a plastic bag. Place a towel between your skin and the bag. Leave the ice on for 20 minutes, 2-3 times a day. Do not fall asleep with ice pack on your skin. Remove the ice if your skin turns bright red. This is very important. If you cannot feel pain, heat, or cold, you have a greater risk of damage to the area. Move your fingers often to avoid stiffness and to lessen swelling. Raise (elevate) your wrist above the level of your heart while you are sitting or lying down. Activity Do not drive until your health care provider approves. Use your hand carefully. Do not do activities that cause pain. You should be able to do light activities with your hand. Do not lift with your affected hand until your health care provider approves. Avoid pulling and pushing with the injured arm. Return to your normal activities as told by your health care provider. Ask your health care provider what activities are safe for you. If physical therapy was prescribed, do exercises as told by your therapist. Physical therapy can help you heal faster and regain movement. General instructions Do not use any products that contain nicotine or tobacco, such as cigarettes and e-cigarettes. These can delay incision healing after surgery. If you need help quitting, ask your health care provider. Keep all follow-up visits. This is important. These include visits for physical therapy. Contact a health care provider if: You have redness around your incision. You have more swelling or pain. You have fluid or blood coming from your incision. Your incision feels warm to the touch. You have pus or a bad smell  coming from your incision. You have a fever or chills. You have pain that does not get better with medicine. Your carpal tunnel symptoms do not go away after 2 months. Your carpal tunnel symptoms go away and then come back. Get help right away if: You have pain or numbness that is getting worse. Your fingers or fingertips become very pale or bluish in color. You are not able to move your fingers. Summary It is common to have wrist stiffness and bruising after a carpal tunnel release. Icing and raising (elevating) your wrist may help to lessen swelling and pain. Call your health care provider if you have a fever or notice any signs of infection in your incision area. This information is not intended to replace advice given to you by your health care provider. Make sure you discuss  any questions you have with your health care provider. Document Revised: 05/30/2019 Document Reviewed: 05/30/2019 Elsevier Patient Education  2022 [place] Anesthesia, Adult, Care After This sheet gives you information about how to care for yourself after your procedure. Your health care provider may also give you more specific instructions. If you have problems or questions, contact your health care provider. What can I expect after the procedure? After the procedure, the following side effects are common: Pain or discomfort at the IV site. Nausea. Vomiting. Sore throat. Trouble concentrating. Feeling cold or chills. Feeling weak or tired. Sleepiness and fatigue. Soreness and body aches. These side effects can affect parts of the body that were not involved in surgery. Follow these instructions at home: For the time period you were told by your health care provider:  Rest. Do not participate in activities where you could fall or become injured. Do not drive or use machinery. Do not drink alcohol. Do not take sleeping pills or medicines that cause drowsiness. Do not make important decisions  or sign legal documents. Do not take care of children on your own. Eating and drinking Follow any instructions from your health care provider about eating or drinking restrictions. When you feel hungry, start by eating small amounts of foods that are soft and easy to digest (bland), such as toast. Gradually return to your regular diet. Drink enough fluid to keep your urine pale yellow. If you vomit, rehydrate by drinking water, juice, or clear broth. General instructions If you have sleep apnea, surgery and certain medicines can increase your risk for breathing problems. Follow instructions from your health care provider about wearing your sleep device: Anytime you are sleeping, including during daytime naps. While taking prescription pain medicines, sleeping medicines, or medicines that make you drowsy. Have a responsible adult stay with you for the time you are told. It is important to have someone help care for you until you are awake and alert. Return to your normal activities as told by your health care provider. Ask your health care provider what activities are safe for you. Take over-the-counter and prescription medicines only as told by your health care provider. If you smoke, do not smoke without supervision. Keep all follow-up visits as told by your health care provider. This is important. Contact a health care provider if: You have nausea or vomiting that does not get better with medicine. You cannot eat or drink without vomiting. You have pain that does not get better with medicine. You are unable to pass urine. You develop a skin rash. You have a fever. You have redness around your IV site that gets worse. Get help right away if: You have difficulty breathing. You have chest pain. You have blood in your urine or stool, or you vomit blood. Summary After the procedure, it is common to have a sore throat or nausea. It is also common to feel tired. Have a responsible adult stay  with you for the time you are told. It is important to have someone help care for you until you are awake and alert. When you feel hungry, start by eating small amounts of foods that are soft and easy to digest (bland), such as toast. Gradually return to your regular diet. Drink enough fluid to keep your urine pale yellow. Return to your normal activities as told by your health care provider. Ask your health care provider what activities are safe for you. This information is not intended to replace advice given to  you by your health care provider. Make sure you discuss any questions you have with your health care provider. Document Revised: 10/10/2019 Document Reviewed: 05/09/2019 Elsevier Patient Education  2022 [place]. How to Use Chlorhexidine for Bathing Chlorhexidine gluconate (CHG) is a germ-killing (antiseptic) solution that is used to clean the skin. It can get rid of the bacteria that normally live on the skin and can keep them away for about 24 hours. To clean your skin with CHG, you may be given: A CHG solution to use in the shower or as part of a sponge bath. A prepackaged cloth that contains CHG. Cleaning your skin with CHG may help lower the risk for infection: While you are staying in the intensive care unit of the hospital. If you have a vascular access, such as a central line, to provide short-term or long-term access to your veins. If you have a catheter to drain urine from your bladder. If you are on a ventilator. A ventilator is a machine that helps you breathe by moving air in and out of your lungs. After surgery. What are the risks? Risks of using CHG include: A skin reaction. Hearing loss, if CHG gets in your ears and you have a perforated eardrum. Eye injury, if CHG gets in your eyes and is not rinsed out. The CHG product catching fire. Make sure that you avoid smoking and flames after applying CHG to your skin. Do not use CHG: If you have a chlorhexidine  allergy or have previously reacted to chlorhexidine. On babies younger than 78 months of age. How to use CHG solution Use CHG only as told by your health care provider, and follow the instructions on the label. Use the full amount of CHG as directed. Usually, this is one bottle. During a shower Follow these steps when using CHG solution during a shower (unless your health care provider gives you different instructions): Start the shower. Use your normal soap and shampoo to wash your face and hair. Turn off the shower or move out of the shower stream. Pour the CHG onto a clean washcloth. Do not use any type of brush or rough-edged sponge. Starting at your neck, lather your body down to your toes. Make sure you follow these instructions: If you will be having surgery, pay special attention to the part of your body where you will be having surgery. Scrub this area for at least 1 minute. Do not use CHG on your head or face. If the solution gets into your ears or eyes, rinse them well with water. Avoid your genital area. Avoid any areas of skin that have broken skin, cuts, or scrapes. Scrub your back and under your arms. Make sure to wash skin folds. Let the lather sit on your skin for 1-2 minutes or as long as told by your health care provider. Thoroughly rinse your entire body in the shower. Make sure that all body creases and crevices are rinsed well. Dry off with a clean towel. Do not put any substances on your body afterward--such as powder, lotion, or perfume--unless you are told to do so by your health care provider. Only use lotions that are recommended by the manufacturer. Put on clean clothes or pajamas. If it is the night before your surgery, sleep in clean sheets.  During a sponge bath Follow these steps when using CHG solution during a sponge bath (unless your health care provider gives you different instructions): Use your normal soap and shampoo to wash your face  and hair. Pour the  CHG onto a clean washcloth. Starting at your neck, lather your body down to your toes. Make sure you follow these instructions: If you will be having surgery, pay special attention to the part of your body where you will be having surgery. Scrub this area for at least 1 minute. Do not use CHG on your head or face. If the solution gets into your ears or eyes, rinse them well with water. Avoid your genital area. Avoid any areas of skin that have broken skin, cuts, or scrapes. Scrub your back and under your arms. Make sure to wash skin folds. Let the lather sit on your skin for 1-2 minutes or as long as told by your health care provider. Using a different clean, wet washcloth, thoroughly rinse your entire body. Make sure that all body creases and crevices are rinsed well. Dry off with a clean towel. Do not put any substances on your body afterward--such as powder, lotion, or perfume--unless you are told to do so by your health care provider. Only use lotions that are recommended by the manufacturer. Put on clean clothes or pajamas. If it is the night before your surgery, sleep in clean sheets. How to use CHG prepackaged cloths Only use CHG cloths as told by your health care provider, and follow the instructions on the label. Use the CHG cloth on clean, dry skin. Do not use the CHG cloth on your head or face unless your health care provider tells you to. When washing with the CHG cloth: Avoid your genital area. Avoid any areas of skin that have broken skin, cuts, or scrapes. Before surgery Follow these steps when using a CHG cloth to clean before surgery (unless your health care provider gives you different instructions): Using the CHG cloth, vigorously scrub the part of your body where you will be having surgery. Scrub using a back-and-forth motion for 3 minutes. The area on your body should be completely wet with CHG when you are done scrubbing. Do not rinse. Discard the cloth and let the area  air-dry. Do not put any substances on the area afterward, such as powder, lotion, or perfume. Put on clean clothes or pajamas. If it is the night before your surgery, sleep in clean sheets.  For general bathing Follow these steps when using CHG cloths for general bathing (unless your health care provider gives you different instructions). Use a separate CHG cloth for each area of your body. Make sure you wash between any folds of skin and between your fingers and toes. Wash your body in the following order, switching to a new cloth after each step: The front of your neck, shoulders, and chest. Both of your arms, under your arms, and your hands. Your stomach and groin area, avoiding the genitals. Your right leg and foot. Your left leg and foot. The back of your neck, your back, and your buttocks. Do not rinse. Discard the cloth and let the area air-dry. Do not put any substances on your body afterward--such as powder, lotion, or perfume--unless you are told to do so by your health care provider. Only use lotions that are recommended by the manufacturer. Put on clean clothes or pajamas. Contact a health care provider if: Your skin gets irritated after scrubbing. You have questions about using your solution or cloth. You swallow any chlorhexidine. Call your local poison control center (1-4754499614 in the [place] Get help right away if: Your eyes itch badly, or they become very red  or swollen. Your skin itches badly and is red or swollen. Your hearing changes. You have trouble seeing. You have swelling or tingling in your mouth or throat. You have trouble breathing. These symptoms may represent a serious problem that is an emergency. Do not wait to see if the symptoms will go away. Get medical help right away. Call your local emergency services (911 in the [place] Do not drive yourself to the hospital. Summary Chlorhexidine gluconate (CHG) is a germ-killing (antiseptic) solution that is used  to clean the skin. Cleaning your skin with CHG may help to lower your risk for infection. You may be given CHG to use for bathing. It may be in a bottle or in a prepackaged cloth to use on your skin. Carefully follow your health care provider's instructions and the instructions on the product label. Do not use CHG if you have a chlorhexidine allergy. Contact your health care provider if your skin gets irritated after scrubbing. This information is not intended to replace advice given to you by your health care provider. Make sure you discuss any questions you have with your health care provider. Document Revised: 04/06/2020 Document Reviewed: 04/06/2020 Elsevier Patient Education  2022 [employer].

## 2021-02-26 ENCOUNTER — Encounter (HOSPITAL_COMMUNITY): Payer: Self-pay

## 2021-02-26 ENCOUNTER — Encounter (HOSPITAL_COMMUNITY)
Admission: RE | Admit: 2021-02-26 | Discharge: 2021-02-26 | Disposition: A | Discharge status: Home / Self Care | Payer: Medicare Other | Source: Ambulatory Visit | Attending: Orthopedic Surgery | Admitting: Orthopedic Surgery

## 2021-02-26 ENCOUNTER — Other Ambulatory Visit: Payer: Self-pay

## 2021-02-26 DIAGNOSIS — I2510 Atherosclerotic heart disease of native coronary artery without angina pectoris: Secondary | ICD-10-CM | POA: Insufficient documentation

## 2021-02-26 DIAGNOSIS — G5602 Carpal tunnel syndrome, left upper limb: Secondary | ICD-10-CM | POA: Insufficient documentation

## 2021-02-26 DIAGNOSIS — G5601 Carpal tunnel syndrome, right upper limb: Secondary | ICD-10-CM | POA: Insufficient documentation

## 2021-02-26 DIAGNOSIS — Z01812 Encounter for preprocedural laboratory examination: Secondary | ICD-10-CM | POA: Insufficient documentation

## 2021-02-26 LAB — CBC WITH DIFFERENTIAL/PLATELET
Abs Immature Granulocytes: 0.02 10*3/uL (ref 0.00–0.07)
Basophils Absolute: 0.1 10*3/uL (ref 0.0–0.1)
Basophils Relative: 1 %
Eosinophils Absolute: 0.1 10*3/uL (ref 0.0–0.5)
Eosinophils Relative: 2 %
HCT: 44.3 % (ref 39.0–52.0)
Hemoglobin: 14.9 g/dL (ref 13.0–17.0)
Immature Granulocytes: 0 %
Lymphocytes Relative: 27 %
Lymphs Abs: 1.8 10*3/uL (ref 0.7–4.0)
MCH: 31.2 pg (ref 26.0–34.0)
MCHC: 33.6 g/dL (ref 30.0–36.0)
MCV: 92.7 fL (ref 80.0–100.0)
Monocytes Absolute: 0.8 10*3/uL (ref 0.1–1.0)
Monocytes Relative: 13 %
Neutro Abs: 3.7 10*3/uL (ref 1.7–7.7)
Neutrophils Relative %: 57 %
Platelets: 404 10*3/uL — ABNORMAL HIGH (ref 150–400)
RBC: 4.78 MIL/uL (ref 4.22–5.81)
RDW: 13.3 % (ref 11.5–15.5)
WBC: 6.6 10*3/uL (ref 4.0–10.5)
nRBC: 0 % (ref 0.0–0.2)

## 2021-02-26 LAB — BASIC METABOLIC PANEL
Anion gap: 10 (ref 5–15)
BUN: 10 mg/dL (ref 8–23)
CO2: 24 mmol/L (ref 22–32)
Calcium: 8.7 mg/dL — ABNORMAL LOW (ref 8.9–10.3)
Chloride: 105 mmol/L (ref 98–111)
Creatinine, Ser: 0.84 mg/dL (ref 0.61–1.24)
GFR, Estimated: >60 mL/min (ref >60)
Glucose, Bld: 99 mg/dL (ref 70–99)
Potassium: 3.8 mmol/L (ref 3.5–5.1)
Sodium: 139 mmol/L (ref 135–145)

## 2021-02-26 NOTE — H&P (Signed)
Chief Complaint  Patient presents with   Hand Pain      Right worse than left swollen, numbness        HPI [name] is a 71 y.o. male.  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  He had nerve conduction studies done at Dr. Freddie Apley office which were positive for bilateral carpal tunnel syndrome  He has typical complaints of numbness tingling pain swelling lack of fine motor function.  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 of heart rate dropping during 2 surgeries   (2004- back surgery and with Lithotrispy )   Crohn's disease ([place])     Depression     Dyspnea      with exertion   History of hepatitis      unknown type - 1970s   History of kidney stones     Hyperlipidemia      unable to tolerat statins   Hypertension      states under control with meds., has been on med. since 1994   Immature cataract of both eyes     Limited joint range of motion      cervical spine   Myocardial infarct ([place]) 09/13/1992   Nonobstructive atherosclerosis of coronary artery     Numbness in both hands     Obesity     Osteoarthritis 11/2016    left shoulder   Rotator cuff tear, left 11/2016   Sleep apnea      uses BiPAP, but having issues with mask not sealing    Thyroid nodule             Past Surgical History:  Procedure Laterality Date   ANAL FISTULOTOMY N/A 06/28/2013    Procedure: ANAL FISTULOTOMY;  Surgeon: [name], MD;  Location: [facility];  Service: General;   Laterality: N/A;   ANAL FISTULOTOMY   2017   ANTERIOR CERVICAL DECOMP/DISCECTOMY FUSION N/A 11/12/2015    Procedure: ANTERIOR CERVICAL DECOMPRESSION/DISCECTOMY FUSION CERVICAL THREE- CERVICAL FOUR, CERVICAL FOUR- CERVICAL FIVE;  Surgeon: Jovita Gamma, MD;  Location: [facility];  Service: Neurosurgery;  Laterality: N/A;  ANTERIOR CERVICAL DECOMPRESSION/DISCECTOMY FUSION C3-C4, C4-C5   BIOPSY THYROID       CARDIAC CATHETERIZATION   1994, 12/25/2007    a. PTCA alone in 1994 b. RCA CTO w/ L-R collaterals, 40-50% prox RCA, 20% prox LAD; EF 50-55%, inferoapical HK   CARDIAC CATHETERIZATION   04/02/2001   CARDIAC CATHETERIZATION   11/07/2000   CARDIAC CATHETERIZATION   09/24/1996   CARPAL TUNNEL RELEASE Left 10/09/2014    Procedure: LEFT CARPAL TUNNEL RELEASE;  Surgeon: [name], MD;  Location: [facility];  Service: Orthopedics;  Laterality: Left;   CARPAL TUNNEL RELEASE Right 08/28/2003   CAUTERY OF TURBINATES   11/05/2003   COLONOSCOPY N/A 11/06/2014    RMR: Skipped Colonic ulcerations with significant  ileocecal valve involvement and rectal sparing most consisitant with Crohns disease. Minimal non-steroidal drug use. Probable colonic lipoma   CYSTECTOMY       INCISION AND DRAINAGE ABSCESS Right 05/23/2012    Procedure: INCISION AND DRAINAGE ABSCESS;  Surgeon: [name], MD;  Location: [facility];  Service: General;  Laterality: Right;   KNEE ARTHROSCOPY       LITHOTRIPSY       LUMBAR FUSION   02/18/2002   LUMBAR LAMINECTOMY   02/18/2002    L4-5   NASAL SEPTUM SURGERY   11/05/2003   PARTIAL NEPHRECTOMY Left      age 41   SHOULDER ARTHROSCOPY WITH SUBACROMIAL DECOMPRESSION, ROTATOR CUFF REPAIR AND BICEP TENDON REPAIR Left 11/30/2016    Procedure: LEFT SHOULDER ARTHROSCOPY, DEBRIDEMENT, ACROMIOPLASTY WITH DISTAL CLAVICAL EXCISION, POSSIBLE ROTATOR CUFF REPAIR AND BICEP TENODESIS;  Surgeon: Ninetta Lights, MD;  Location: [facility];  Service: Orthopedics;  Laterality: Left;   SPLENECTOMY         age 49   TRANSTHORACIC ECHOCARDIOGRAM   03/05/2009    EF >55%, normal LV wall thickness.           Family History  Problem Relation Age of Onset   CAD Father          CABG    CAD Brother          CABG in 42s   CAD Brother     Arthritis/Rheumatoid Mother     Hyperlipidemia Mother     Thrombocytopenia Mother     COPD Son          smoker   Alzheimer's disease Maternal Grandmother     Lung disease Paternal Grandfather          Social History         Tobacco Use   Smoking status: Former      Packs/day: 2.00      Years: 18.00      Pack years: 36.00      Types: Cigarettes      Quit date: 02/06/1993      Years since quitting: 28.0   Smokeless tobacco: Former   Tobacco comments:      Chew twice yearly  Vaping Use   Vaping Use: Never used  Substance Use Topics   Alcohol use: No      Alcohol/week: 0.0 standard drinks   Drug use: No           Allergies  Allergen Reactions   Niacin Other (See Comments)      FLUSHING   Niacin And Related Other (See Comments)      FLUSHING   Other Other (See Comments)      MYALGIAS   Penicillins Hives, Itching and Other (See Comments)      Has patient had a PCN reaction causing immediate rash, facial/tongue/throat swelling, SOB or lightheadedness with hypotension: Unknown Has patient had a PCN reaction causing severe rash involving mucus membranes or skin necrosis: Unknown Has patient had a PCN reaction that required hospitalization: Unknown Has patient had a PCN reaction occurring within the last 10 years: No If all of the above answers are "NO", then may proceed with Cephalosporin use.   Has patient had a PCN reaction causing immediate rash, facial/tongue/throat swelling, SOB or lightheadedness with hypotension: Unknown Has patient had a PCN reaction causing severe rash involving mucus membranes or skin necrosis: Unknown Has patient had a PCN reaction that required hospitalization: Unknown Has patient had a PCN reaction  occurring within the last 10 years: No If all of the above answers are "NO", then may proceed with Cephalosporin use.   Statins Other (See Comments)      MYALGIAS            Current Outpatient Medications  Medication Sig Dispense Refill   albuterol (VENTOLIN HFA) 108 (90 Base) MCG/ACT inhaler Inhale 2 puffs into the lungs every 4 (four) hours as needed for wheezing or shortness of breath (cough). 18 g 1   ALPRAZolam (XANAX) 0.5 MG tablet Take 1 tablet (0.5 mg total) by mouth 3 (three) times daily as needed (dizziness). 21 tablet 0   amLODipine (NORVASC) 5 MG tablet Take 1 tablet (5 mg total) by mouth daily. 180 tablet 3   ascorbic acid (VITAMIN C) 500 MG tablet Take 1 tablet (500 mg total) by mouth daily. 30 tablet 2   guaiFENesin-dextromethorphan (ROBITUSSIN DM) 100-10 MG/5ML syrup Take 10 mLs by mouth every 4 (four) hours as needed for cough. 118 mL 0   loperamide (IMODIUM) 2 MG capsule Take 1 capsule (2 mg total) by mouth every 12 (twelve) hours as needed for diarrhea or loose stools. 10 capsule 0   MELATONIN PO Take by mouth.       metoprolol succinate (TOPROL-XL) 50 MG 24 hr tablet Take 50 mg by mouth daily. Take with or immediately following a meal.       naproxen (NAPROSYN) 375 MG tablet Take 1 tablet (375 mg total) by mouth 2 (two) times daily. Take with food 10 tablet 0   nitroGLYCERIN (NITROSTAT) 0.4 MG SL tablet Place 1 tablet (0.4 mg total) under the tongue every 5 (five) minutes as needed for chest pain. 25 tablet 3   ondansetron (ZOFRAN) 4 MG tablet Take 1 tablet (4 mg total) by mouth every 6 (six) hours as needed for nausea. 20 tablet 0   Probiotic Product (PROBIOTIC DAILY PO) Take 1 capsule by mouth daily. SUPREMA DOPHILUS       Psyllium Husk POWD Take 12 g by mouth daily. INTO 12 OUNCES OF WATER (1 TABLESPOON)       ramipril (ALTACE) 10 MG capsule Take 1 capsule (10 mg total) by mouth daily. Please do NOT  Restart Ramipril until 12/09/2019 30 capsule 0   traZODone (DESYREL) 50  MG tablet Take by mouth.       zinc sulfate 220 (50 Zn) MG capsule Take 1 capsule (220 mg total) by mouth daily. 30 capsule 0   ezetimibe (ZETIA) 10 MG tablet Take 1 tablet (10 mg total) by mouth daily. 90 tablet 3    No current facility-administered medications for this visit.        Physical Exam BP (!) 159/93    Pulse 72    Ht 5\' 9"  (1.753 m)    Wt 224 lb (101.6 kg)    BMI 33.08 kg/m    The patient is well developed well nourished and well groomed.  Orientation to person place and time is normal  Mood is pleasant.   Ambulatory status normal gait and stance    Right upper extremity examination reveals the following:   SWELLING yes TENDERNESS OVER THE CARPAL TUNNEL yes   Range of motion of the wrist normal   Motor exam normal   Wrist joint is stable   Provocative tests for carpal tunnel Phalen's test positive Carpal tunnel compression test positive Tinel's test negative   Pulses are normal in the radial and ulnar artery with a normal Allen's test.  SENSORY EXAM:    Left upper extremity upper extremity examination reveals the following:   SWELLING yes TENDERNESS OVER THE CARPAL TUNNEL yes   Range of motion of the wrist normal   Motor exam normal   Wrist joint is stable   Provocative tests for carpal tunnel Phalen's test positive Carpal tunnel compression test positive Tinel's test negative   Pulses are normal in the radial and ulnar artery with a normal Allen's test.     Plan     MEDICAL DECISION SECTION    XRAYS: none       Encounter Diagnoses  Name Primary?   Carpal tunnel syndrome of right wrist Yes   Carpal tunnel syndrome of left wrist          PLAN:  The procedure has been fully reviewed with the patient; The risks and benefits of surgery have been discussed and explained and understood. Alternative treatment has also been reviewed, questions were encouraged and answered. The postoperative plan is also been reviewed.   LEFT CTR TO BE  F/B RT CTR   Arther Abbott, MD 8:25 AM1/20/2023

## 2021-03-02 ENCOUNTER — Ambulatory Visit (HOSPITAL_COMMUNITY): Payer: Medicare Other | Admitting: Certified Registered"

## 2021-03-02 ENCOUNTER — Other Ambulatory Visit: Payer: Self-pay

## 2021-03-02 ENCOUNTER — Encounter (HOSPITAL_COMMUNITY): Payer: Self-pay | Admitting: Orthopedic Surgery

## 2021-03-02 ENCOUNTER — Encounter (HOSPITAL_COMMUNITY)
Admission: RE | Disposition: A | Discharge status: Home / Self Care | Payer: Self-pay | Source: Home / Self Care | Attending: Orthopedic Surgery

## 2021-03-02 ENCOUNTER — Ambulatory Visit (HOSPITAL_COMMUNITY)
Admission: RE | Admit: 2021-03-02 | Discharge: 2021-03-02 | Disposition: A | Discharge status: Home / Self Care | Payer: Medicare Other | Attending: Orthopedic Surgery | Admitting: Orthopedic Surgery

## 2021-03-02 DIAGNOSIS — F419 Anxiety disorder, unspecified: Secondary | ICD-10-CM | POA: Insufficient documentation

## 2021-03-02 DIAGNOSIS — I2510 Atherosclerotic heart disease of native coronary artery without angina pectoris: Secondary | ICD-10-CM | POA: Insufficient documentation

## 2021-03-02 DIAGNOSIS — G5602 Carpal tunnel syndrome, left upper limb: Secondary | ICD-10-CM

## 2021-03-02 DIAGNOSIS — I10 Essential (primary) hypertension: Secondary | ICD-10-CM | POA: Diagnosis not present

## 2021-03-02 DIAGNOSIS — F32A Depression, unspecified: Secondary | ICD-10-CM | POA: Diagnosis not present

## 2021-03-02 DIAGNOSIS — G4730 Sleep apnea, unspecified: Secondary | ICD-10-CM | POA: Insufficient documentation

## 2021-03-02 DIAGNOSIS — Z87891 Personal history of nicotine dependence: Secondary | ICD-10-CM | POA: Insufficient documentation

## 2021-03-02 DIAGNOSIS — I252 Old myocardial infarction: Secondary | ICD-10-CM | POA: Diagnosis not present

## 2021-03-02 HISTORY — PX: CARPAL TUNNEL RELEASE: SHX101

## 2021-03-02 SURGERY — CARPAL TUNNEL RELEASE
Anesthesia: General | Site: Hand | Laterality: Left

## 2021-03-02 MED ORDER — BUPIVACAINE HCL (PF) 0.5 % IJ SOLN
INTRAMUSCULAR | Status: AC
  Filled 2021-03-02: qty 30

## 2021-03-02 MED ORDER — ORAL CARE MOUTH RINSE: 15.0000 mL | Freq: Once | OROMUCOSAL | Status: AC

## 2021-03-02 MED ORDER — PROPOFOL 500 MG/50ML IV EMUL
INTRAVENOUS | Status: DC | PRN
  Administered 2021-03-02: 50 ug/kg/min via INTRAVENOUS

## 2021-03-02 MED ORDER — BUPIVACAINE HCL (PF) 0.5 % IJ SOLN
INTRAMUSCULAR | Status: DC | PRN
  Administered 2021-03-02: 20 mL

## 2021-03-02 MED ORDER — ONDANSETRON HCL 4 MG/2ML IJ SOLN: 4.0000 mg | Freq: Once | INTRAMUSCULAR | Status: DC | PRN

## 2021-03-02 MED ORDER — PROPOFOL 10 MG/ML IV BOLUS
INTRAVENOUS | Status: DC | PRN
  Administered 2021-03-02: 20 mg via INTRAVENOUS
  Administered 2021-03-02: 30 mg via INTRAVENOUS
  Administered 2021-03-02: 50 mg via INTRAVENOUS

## 2021-03-02 MED ORDER — LIDOCAINE HCL (CARDIAC) PF 100 MG/5ML IV SOSY
PREFILLED_SYRINGE | INTRAVENOUS | Status: DC | PRN
  Administered 2021-03-02: 60 mg via INTRAVENOUS

## 2021-03-02 MED ORDER — LACTATED RINGERS IV SOLN: INTRAVENOUS | Status: DC

## 2021-03-02 MED ORDER — FENTANYL CITRATE (PF) 250 MCG/5ML IJ SOLN
INTRAMUSCULAR | Status: DC | PRN
Start: 2021-03-02 — End: 2021-03-02
  Administered 2021-03-02 (×3): 25 ug via INTRAVENOUS

## 2021-03-02 MED ORDER — CHLORHEXIDINE GLUCONATE 0.12 % MT SOLN
15.0000 mL | Freq: Once | OROMUCOSAL | Status: AC
  Administered 2021-03-02: 10:00:00 15 mL via OROMUCOSAL

## 2021-03-02 MED ORDER — FENTANYL CITRATE PF 50 MCG/ML IJ SOSY: 25.0000 ug | PREFILLED_SYRINGE | INTRAMUSCULAR | Status: DC | PRN

## 2021-03-02 MED ORDER — VANCOMYCIN HCL 1500 MG/300ML IV SOLN
1500.0000 mg | INTRAVENOUS | Status: AC
  Administered 2021-03-02: 10:00:00 1500 mg via INTRAVENOUS
  Filled 2021-03-02: qty 300

## 2021-03-02 MED ORDER — LIDOCAINE HCL (PF) 2 % IJ SOLN
INTRAMUSCULAR | Status: AC
  Filled 2021-03-02: qty 5

## 2021-03-02 MED ORDER — ACETAMINOPHEN-CODEINE #3 300-30 MG PO TABS
1.0000 | ORAL_TABLET | Freq: Four times a day (QID) | ORAL | 0 refills | Status: DC | PRN
Start: 2021-03-02 — End: 2023-08-29

## 2021-03-02 MED ORDER — 0.9 % SODIUM CHLORIDE (POUR BTL) OPTIME
TOPICAL | Status: DC | PRN
  Administered 2021-03-02: 11:00:00 1000 mL

## 2021-03-02 MED ORDER — FENTANYL CITRATE (PF) 100 MCG/2ML IJ SOLN
INTRAMUSCULAR | Status: AC
  Filled 2021-03-02: qty 2

## 2021-03-02 SURGICAL SUPPLY — 40 items
APL PRP STRL LF DISP 70% ISPRP (MISCELLANEOUS) ×1
BANDAGE ESMARK 4X12 BL STRL LF (DISPOSABLE) ×1 IMPLANT
BANDAGE GAUZE ELAST BULKY 4 IN (GAUZE/BANDAGES/DRESSINGS) ×1 IMPLANT
BLADE SURG 15 STRL LF DISP TIS (BLADE) ×1 IMPLANT
BLADE SURG 15 STRL SS (BLADE) ×3
BNDG CMPR 12X4 ELC STRL LF (DISPOSABLE) ×1
BNDG CMPR STD VLCR NS LF 5.8X3 (GAUZE/BANDAGES/DRESSINGS) ×1
BNDG COHESIVE 4X5 TAN ST LF (GAUZE/BANDAGES/DRESSINGS) ×2 IMPLANT
BNDG ELASTIC 3X5.8 VLCR NS LF (GAUZE/BANDAGES/DRESSINGS) ×3 IMPLANT
BNDG ESMARK 4X12 BLUE STRL LF (DISPOSABLE) ×3
BNDG GAUZE ELAST 4 BULKY (GAUZE/BANDAGES/DRESSINGS) ×3 IMPLANT
CHLORAPREP W/TINT 26 (MISCELLANEOUS) ×3 IMPLANT
CLOTH BEACON ORANGE TIMEOUT ST (SAFETY) ×3 IMPLANT
COVER LIGHT HANDLE STERIS (MISCELLANEOUS) ×6 IMPLANT
CUFF TOURN SGL QUICK 24 (TOURNIQUET CUFF) ×3
CUFF TRNQT CYL 24X4X16.5-23 (TOURNIQUET CUFF) IMPLANT
DRAPE HALF SHEET 40X57 (DRAPES) ×3 IMPLANT
DRSG XEROFORM 1X8 (GAUZE/BANDAGES/DRESSINGS) ×2 IMPLANT
ELECT REM PT RETURN 9FT ADLT (ELECTROSURGICAL) ×3
ELECTRODE REM PT RTRN 9FT ADLT (ELECTROSURGICAL) ×1 IMPLANT
GAUZE 4X4 16PLY ~~LOC~~+RFID DBL (SPONGE) ×3 IMPLANT
GAUZE SPONGE 4X4 12PLY STRL (GAUZE/BANDAGES/DRESSINGS) ×3 IMPLANT
GAUZE XEROFORM 1X8 LF (GAUZE/BANDAGES/DRESSINGS) ×3 IMPLANT
GLOVE SS N UNI LF 8.5 STRL (GLOVE) ×3 IMPLANT
GLOVE SURG POLYISO LF SZ7 (GLOVE) ×2 IMPLANT
GLOVE SURG POLYISO LF SZ8 (GLOVE) ×3 IMPLANT
GLOVE SURG UNDER POLY LF SZ7 (GLOVE) ×6 IMPLANT
GOWN STRL REUS W/TWL LRG LVL3 (GOWN DISPOSABLE) ×3 IMPLANT
GOWN STRL REUS W/TWL XL LVL3 (GOWN DISPOSABLE) ×3 IMPLANT
KIT TURNOVER KIT A (KITS) ×3 IMPLANT
NDL HYPO 21X1.5 SAFETY (NEEDLE) ×2 IMPLANT
NEEDLE HYPO 21X1.5 SAFETY (NEEDLE) ×6 IMPLANT
NS IRRIG 1000ML POUR BTL (IV SOLUTION) ×3 IMPLANT
PACK BASIC LIMB (CUSTOM PROCEDURE TRAY) ×3 IMPLANT
PAD ARMBOARD 7.5X6 YLW CONV (MISCELLANEOUS) ×3 IMPLANT
POSITIONER HAND ALUMI XLG (MISCELLANEOUS) ×3 IMPLANT
SET BASIN LINEN APH (SET/KITS/TRAYS/PACK) ×3 IMPLANT
SUT ETHILON 3 0 FSL (SUTURE) ×3 IMPLANT
SYR 30ML LL (SYRINGE) ×3 IMPLANT
SYR CONTROL 10ML LL (SYRINGE) ×3 IMPLANT

## 2021-03-02 NOTE — Interval H&P Note (Signed)
History and Physical Interval Note:  03/02/2021 9:52 AM  [name]  has presented today for surgery, with the diagnosis of left carpal tunnel syndrome.  The various methods of treatment have been discussed with the patient and family. After consideration of risks, benefits and other options for treatment, the patient has consented to  Procedure(s): CARPAL TUNNEL RELEASE (Left) as a surgical intervention.  The patient's history has been reviewed, patient examined, no change in status, stable for surgery.  I have reviewed the patient's chart and labs.  Questions were answered to the patient's satisfaction.     Arther Abbott

## 2021-03-02 NOTE — Op Note (Signed)
Date 03/02/2021   PRE-OPERATIVE DIAGNOSIS:  LEFT CARPAL TUNNEL SYNDROME  POST-OPERATIVE DIAGNOSIS:  LEFT CARPAL TUNNEL SYNDROME  PROCEDURE:  Procedure(s): LEFT CARPAL TUNNEL RELEASE (   FINDINGS: Compression median nerve, scarring to the transverse carpal ligament  SURGEON:  Surgeon(s) and Role:    Carole Civil, MD - Primary  PHYSICIAN ASSISTANT:   ASSISTANTS: none   ANESTHESIA:   regional  BLOOD ADMINISTERED:none  DRAINS: none   LOCAL MEDICATIONS USED:  MARCAINE     SPECIMEN:  No Specimen  DISPOSITION OF SPECIMEN:  N/A  COUNTS:  YES  TOURNIQUET: 250 mmHg  DICTATION: .Dragon Dictation   Carpal tunnel release left wrist   Surgeon Aline Brochure  Anesthesia IV sedation with local Marcaine  Operative findings compression of the LEFT median nerve with scarring although mild, transverse carpal ligament  Indications failure of conservative treatment to relieve pain and paresthesias and numbness and tingling of the left hand  The patient was identified in the preop area we confirm the surgical site marked as left wrist chart update completed. Patient taken to surgery.  Antibiotic vancomycin was started.  Then the arm was prepped with ChloraPrep.  IV sedation was given.  Timeout was completed  A 4 inch Esmarch was used to exsanguinate the limb and the tourniquet was elevated  10 cc of Marcaine half percent was injected into the skin in the area of the incision.    A straight incision was made over the left carpal tunnel in line with the radial border of the ring finger. Blunt dissection was carried out to find the distal aspect of the carpal tunnel. A blunted instrument was passed beneath the carpal tunnel. Sharp incision was then used to release the transverse carpal ligament. The contents of the carpal tunnel were inspected. The median nerve was compressed slightly discolored and scarred to things undersurface of the transverse carpal ligament  The wound was  irrigated and then closed with 3-0 nylon in interrupted fashion.  We injected 10 mL of plain Marcaine on the radial side of the [place].  A sterile bandage was applied and the tourniquet was released the color of the hand and capillary refill were normal  The patient was taken to the recovery room in stable condition  PLAN OF CARE: Discharge to home after PACU  PATIENT DISPOSITION:  PACU - hemodynamically stable.   Delay start of Pharmacological VTE agent (>24hrs) due to surgical blood loss or risk of bleeding: not applicable

## 2021-03-02 NOTE — Transfer of Care (Signed)
Immediate Anesthesia Transfer of Care Note  Patient: [name]  Procedure(s) Performed: CARPAL TUNNEL RELEASE (Left: Hand)  Patient Location: Short Stay  Anesthesia Type:General  Level of Consciousness: awake, alert  and oriented  Airway & Oxygen Therapy: Patient Spontanous Breathing  Post-op Assessment: Report given to RN and Post -op Vital signs reviewed and stable  Post vital signs: Reviewed and stable  Last Vitals:  Vitals Value Taken Time  BP    Temp    Pulse    Resp    SpO2      Last Pain:  Vitals:   03/02/21 0905  TempSrc: Oral  PainSc: 0-No pain      Patients Stated Pain Goal: 5 (17/53/01 0404)  Complications: No notable events documented.

## 2021-03-02 NOTE — Brief Op Note (Signed)
03/02/2021  10:56 AM  PATIENT:  [name]  71 y.o. male  PRE-OPERATIVE DIAGNOSIS:  left carpal tunnel syndrome  POST-OPERATIVE DIAGNOSIS:  left carpal tunnel syndrome  PROCEDURE:  Procedure(s): CARPAL TUNNEL RELEASE (Left)  SURGEON:  Surgeon(s) and Role:    Carole Civil, MD - Primary  PHYSICIAN ASSISTANT:   ASSISTANTS: none   ANESTHESIA:   local and IV sedation  EBL:  none   BLOOD ADMINISTERED:none  DRAINS: none   LOCAL MEDICATIONS USED:  MARCAINE     SPECIMEN:  No Specimen  DISPOSITION OF SPECIMEN:  N/A  COUNTS:  YES  TOURNIQUET:   Total Tourniquet Time Documented: Upper Arm (Left) - 21 minutes Total: Upper Arm (Left) - 21 minutes   DICTATION: .Viviann Spare Dictation  PLAN OF CARE: Discharge to home after PACU  PATIENT DISPOSITION:  PACU - hemodynamically stable.   Delay start of Pharmacological VTE agent (>24hrs) due to surgical blood loss or risk of bleeding: not applicable

## 2021-03-02 NOTE — Anesthesia Preprocedure Evaluation (Signed)
Anesthesia Evaluation  Patient identified by MRN, date of birth, ID band Patient awake    Reviewed: Allergy & Precautions, H&P , NPO status , Patient's Chart, lab work & pertinent test results, reviewed documented beta blocker date and time   Airway Mallampati: II  TM Distance: >3 FB Neck ROM: full    Dental no notable dental hx.    Pulmonary shortness of breath, sleep apnea , pneumonia, resolved, former smoker,    Pulmonary exam normal breath sounds clear to auscultation       Cardiovascular Exercise Tolerance: Good hypertension, + CAD and + Past MI   Rhythm:regular Rate:Normal     Neuro/Psych PSYCHIATRIC DISORDERS Anxiety Depression  Neuromuscular disease    GI/Hepatic negative GI ROS, Neg liver ROS,   Endo/Other  negative endocrine ROS  Renal/GU negative Renal ROS  negative genitourinary   Musculoskeletal   Abdominal   Peds  Hematology negative hematology ROS (+)   Anesthesia Other Findings   Reproductive/Obstetrics negative OB ROS                             Anesthesia Physical Anesthesia Plan  ASA: 3  Anesthesia Plan: General   Post-op Pain Management:    Induction:   PONV Risk Score and Plan: Propofol infusion  Airway Management Planned:   Additional Equipment:   Intra-op Plan:   Post-operative Plan:   Informed Consent: I have reviewed the patients History and Physical, chart, labs and discussed the procedure including the risks, benefits and alternatives for the proposed anesthesia with the patient or authorized representative who has indicated his/her understanding and acceptance.     Dental Advisory Given  Plan Discussed with: CRNA  Anesthesia Plan Comments:         Anesthesia Quick Evaluation

## 2021-03-03 ENCOUNTER — Encounter (HOSPITAL_COMMUNITY): Payer: Self-pay | Admitting: Orthopedic Surgery

## 2021-03-03 NOTE — Anesthesia Postprocedure Evaluation (Signed)
Anesthesia Post Note  Patient: [name]  Procedure(s) Performed: CARPAL TUNNEL RELEASE (Left: Hand)  Patient location during evaluation: Phase II Anesthesia Type: General Level of consciousness: awake Pain management: pain level controlled Vital Signs Assessment: post-procedure vital signs reviewed and stable Respiratory status: spontaneous breathing and respiratory function stable Cardiovascular status: blood pressure returned to baseline and stable Postop Assessment: no headache and no apparent nausea or vomiting Anesthetic complications: no Comments: Late entry   No notable events documented.   Last Vitals:  Vitals:   03/02/21 0905 03/02/21 1058  BP: 128/79 117/68  Pulse: 66 (!) 57  Resp: 18 17  Temp: 36.7 C (!) 36.4 C  SpO2: 96% (!) 65%    Last Pain:  Vitals:   03/02/21 1058  TempSrc: Oral  PainSc: 0-No pain                 Louann Sjogren

## 2021-03-10 ENCOUNTER — Encounter: Payer: Medicare Other | Admitting: Orthopedic Surgery

## 2021-03-17 ENCOUNTER — Ambulatory Visit (INDEPENDENT_AMBULATORY_CARE_PROVIDER_SITE_OTHER): Payer: Medicare Other | Admitting: Orthopedic Surgery

## 2021-03-17 ENCOUNTER — Other Ambulatory Visit: Payer: Self-pay

## 2021-03-17 DIAGNOSIS — G5602 Carpal tunnel syndrome, left upper limb: Secondary | ICD-10-CM

## 2021-03-17 NOTE — Progress Notes (Signed)
POST OP   Chief Complaint  Patient presents with   Routine Post Op    CTR LT hand DOS 03/02/21    Encounter Diagnosis  Name Primary?   Carpal tunnel syndrome of left wrist s/p CTR on 03/02/21 Yes    PROCEDURE: Repeat left carpal tunnel release  POV #1, postop day 15  Meds related: None  Patient says hand still numb  The sutures were removed the wound looks clean he can return to normal activities  Patient will call to schedule right carpal tunnel release at his convenience

## 2021-06-17 DIAGNOSIS — H04123 Dry eye syndrome of bilateral lacrimal glands: Secondary | ICD-10-CM | POA: Diagnosis not present

## 2021-08-13 DIAGNOSIS — D518 Other vitamin B12 deficiency anemias: Secondary | ICD-10-CM | POA: Diagnosis not present

## 2021-08-13 DIAGNOSIS — K5090 Crohn's disease, unspecified, without complications: Secondary | ICD-10-CM | POA: Diagnosis not present

## 2021-08-13 DIAGNOSIS — I10 Essential (primary) hypertension: Secondary | ICD-10-CM | POA: Diagnosis not present

## 2021-08-13 DIAGNOSIS — Z0001 Encounter for general adult medical examination with abnormal findings: Secondary | ICD-10-CM | POA: Diagnosis not present

## 2021-08-13 DIAGNOSIS — E559 Vitamin D deficiency, unspecified: Secondary | ICD-10-CM | POA: Diagnosis not present

## 2021-08-13 DIAGNOSIS — M159 Polyosteoarthritis, unspecified: Secondary | ICD-10-CM | POA: Diagnosis not present

## 2021-08-13 DIAGNOSIS — E039 Hypothyroidism, unspecified: Secondary | ICD-10-CM | POA: Diagnosis not present

## 2022-04-07 ENCOUNTER — Encounter: Payer: Self-pay | Admitting: Radiology

## 2022-04-20 IMAGING — US US EXTREM  UP VENOUS*R*
1 series · 13 of 24 positions shown · non-contrast
Comparison: None.

CLINICAL DATA: Right arm pain and swelling



[Series 1: us venous img upper uni right (dvt) · portal-venous · 13 of 48 slices shown]
[im 1/48]
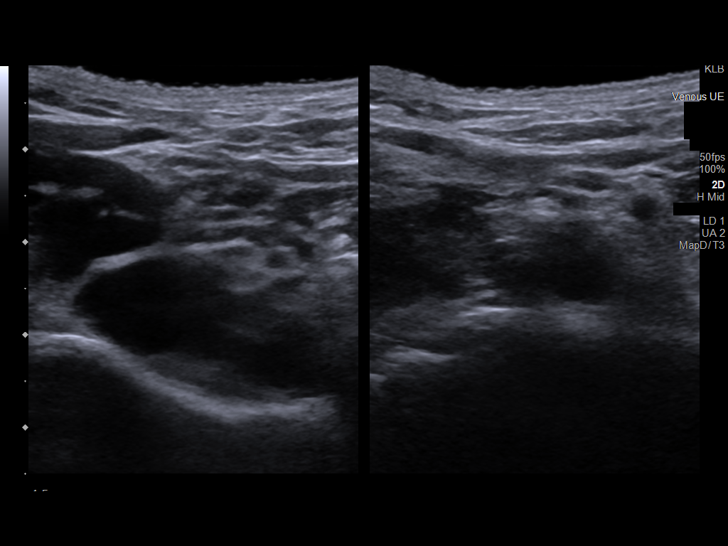
[im 5/48]
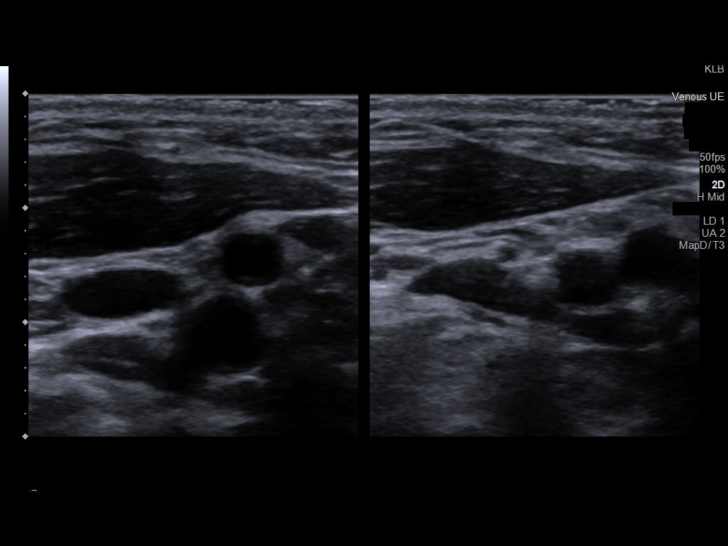
[im 9/48]
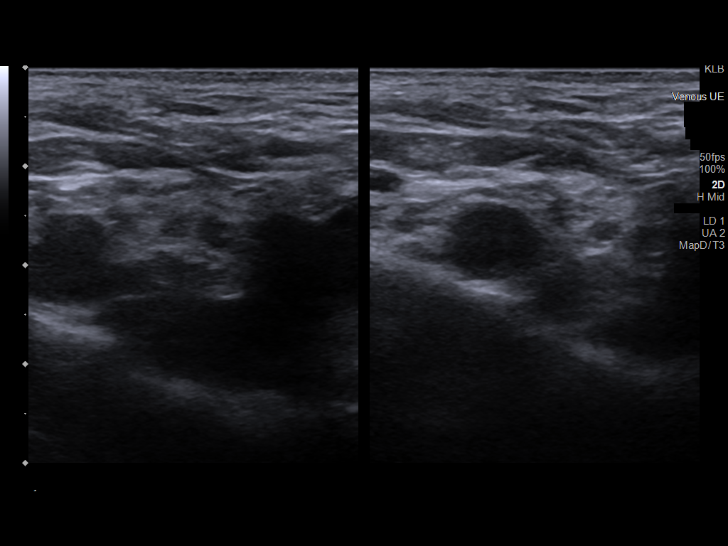
[im 13/48]
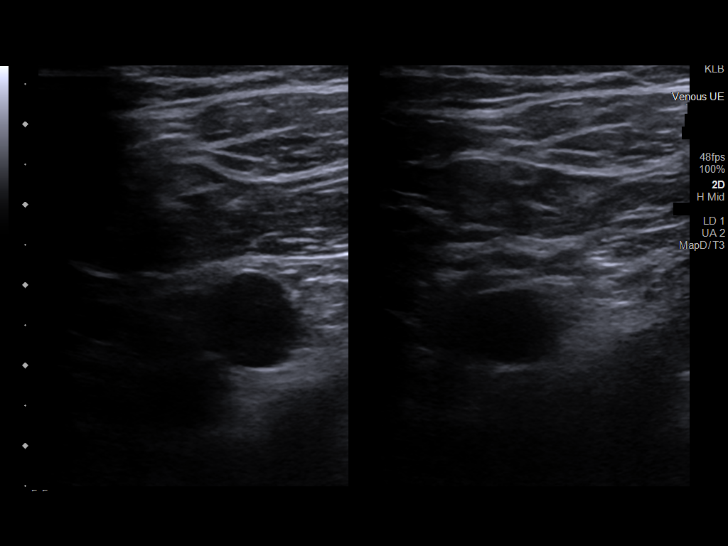
[im 17/48]
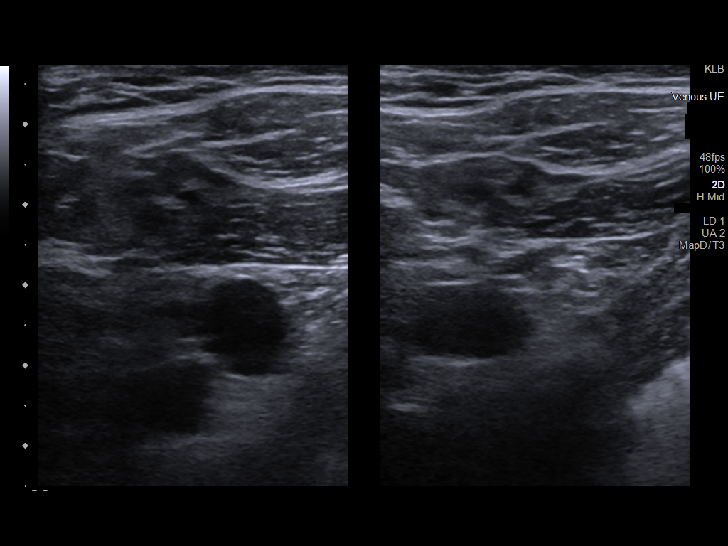
[im 21/48]
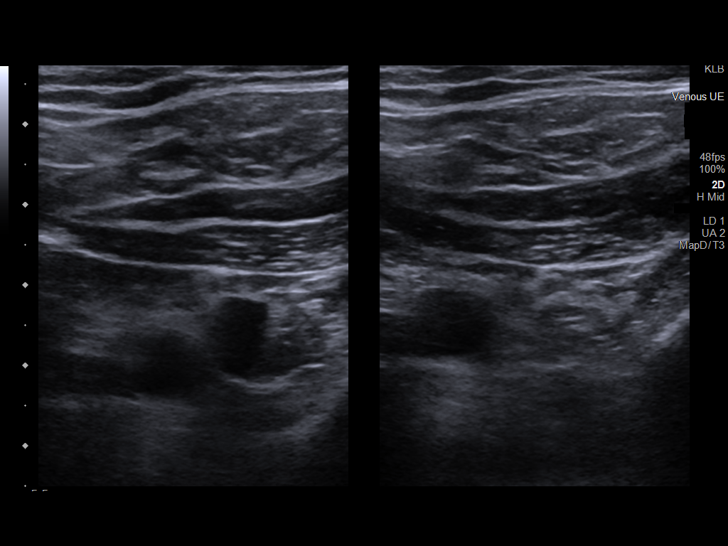
[im 25/48]
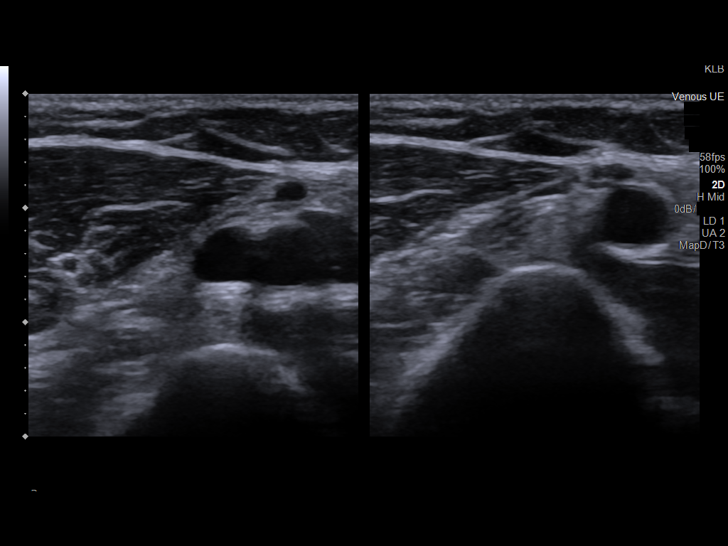
[im 27/48]
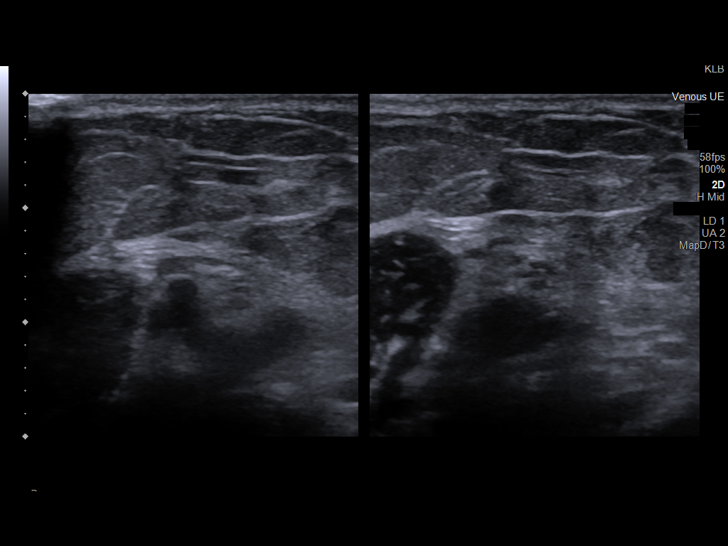
[im 31/48]
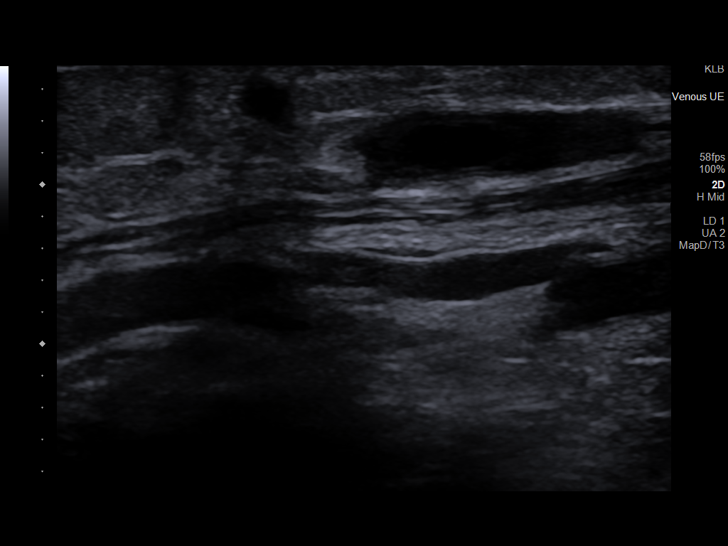
[im 35/48]
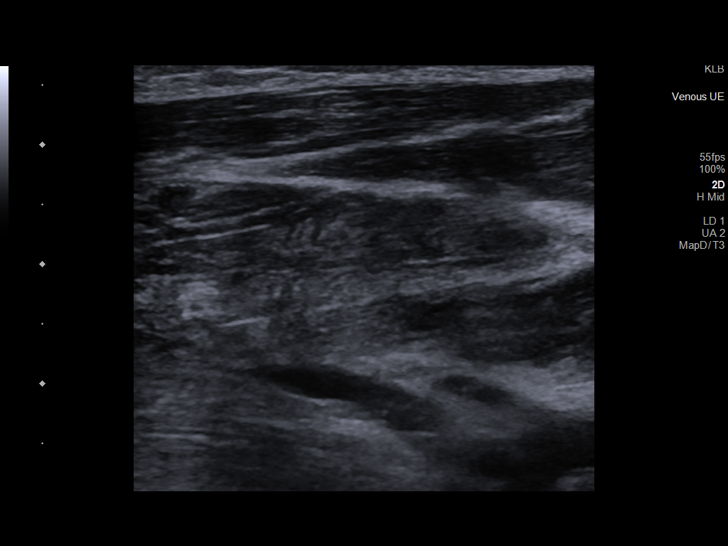
[im 39/48]
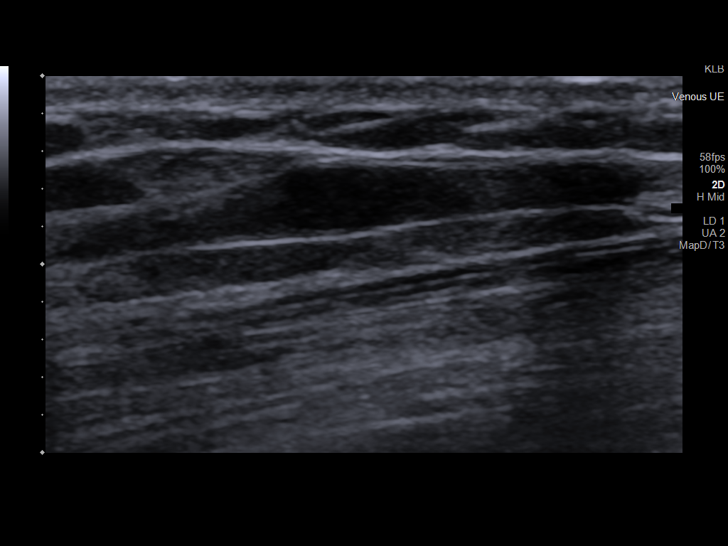
[im 43/48]
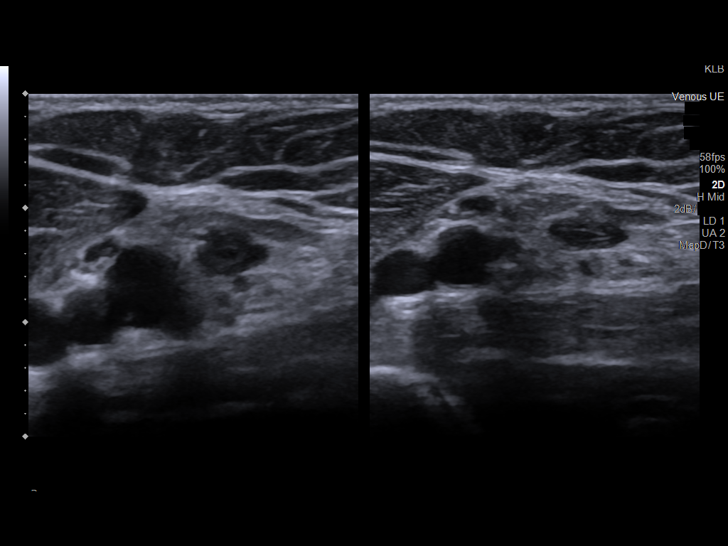
[im 48/48]
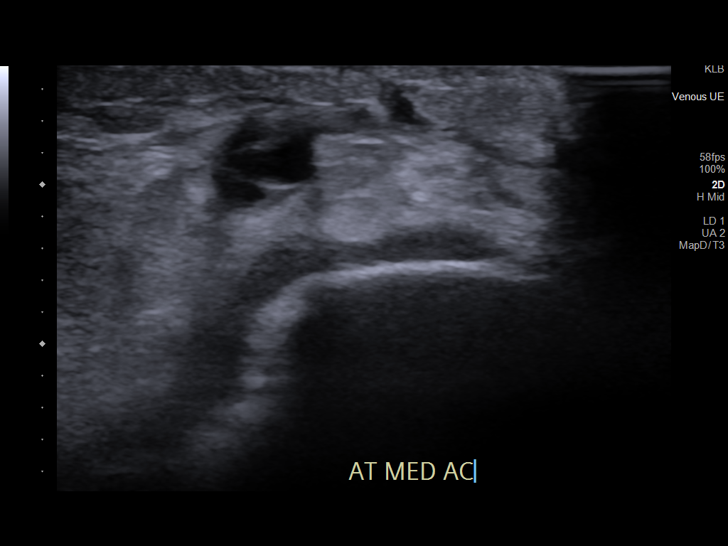

[13 of 24 positions shown; findings below may reference images not displayed]

FINDINGS: Contralateral Subclavian Vein: Respiratory phasicity is normal and
symmetric with the symptomatic side. No evidence of thrombus. Normal
compressibility.

Internal Jugular Vein: No evidence of thrombus. Normal
compressibility, respiratory phasicity and response to augmentation.

Subclavian Vein: No evidence of thrombus. Normal compressibility,
respiratory phasicity and response to augmentation.

Axillary Vein: No evidence of thrombus. Normal compressibility,
respiratory phasicity and response to augmentation.

Cephalic Vein: Thrombus is noted with decreased compressibility.

Basilic Vein: Thrombus is noted with decreased compressibility.

Brachial Veins: No evidence of thrombus. Normal compressibility,
respiratory phasicity and response to augmentation.

Radial Veins: No evidence of thrombus. Normal compressibility,
respiratory phasicity and response to augmentation.

Ulnar Veins: No evidence of thrombus. Normal compressibility,
respiratory phasicity and response to augmentation.

Venous Reflux:  None visualized.

Other Findings:  None visualized.
IMPRESSION: No evidence of DVT within the right upper extremity.

Superficial venous thrombus is noted within the cephalic and basilic
veins.

## 2022-08-15 DIAGNOSIS — I10 Essential (primary) hypertension: Secondary | ICD-10-CM | POA: Diagnosis not present

## 2022-08-15 DIAGNOSIS — D518 Other vitamin B12 deficiency anemias: Secondary | ICD-10-CM | POA: Diagnosis not present

## 2022-08-15 DIAGNOSIS — K5090 Crohn's disease, unspecified, without complications: Secondary | ICD-10-CM | POA: Diagnosis not present

## 2022-08-15 DIAGNOSIS — E039 Hypothyroidism, unspecified: Secondary | ICD-10-CM | POA: Diagnosis not present

## 2022-08-15 DIAGNOSIS — L723 Sebaceous cyst: Secondary | ICD-10-CM | POA: Diagnosis not present

## 2022-08-15 DIAGNOSIS — E559 Vitamin D deficiency, unspecified: Secondary | ICD-10-CM | POA: Diagnosis not present

## 2022-08-15 DIAGNOSIS — E782 Mixed hyperlipidemia: Secondary | ICD-10-CM | POA: Diagnosis not present

## 2022-08-15 DIAGNOSIS — Z0001 Encounter for general adult medical examination with abnormal findings: Secondary | ICD-10-CM | POA: Diagnosis not present

## 2022-08-15 DIAGNOSIS — G5601 Carpal tunnel syndrome, right upper limb: Secondary | ICD-10-CM | POA: Diagnosis not present

## 2022-08-15 DIAGNOSIS — M159 Polyosteoarthritis, unspecified: Secondary | ICD-10-CM | POA: Diagnosis not present

## 2022-08-15 DIAGNOSIS — M1991 Primary osteoarthritis, unspecified site: Secondary | ICD-10-CM | POA: Diagnosis not present

## 2022-12-30 DIAGNOSIS — J3089 Other allergic rhinitis: Secondary | ICD-10-CM | POA: Diagnosis not present

## 2022-12-30 DIAGNOSIS — L408 Other psoriasis: Secondary | ICD-10-CM | POA: Diagnosis not present

## 2023-06-24 ENCOUNTER — Emergency Department (HOSPITAL_COMMUNITY)
Admission: EM | Admit: 2023-06-24 | Discharge: 2023-06-24 | Disposition: A | Discharge status: Home / Self Care | Attending: Emergency Medicine | Admitting: Emergency Medicine

## 2023-06-24 ENCOUNTER — Other Ambulatory Visit: Payer: Self-pay

## 2023-06-24 ENCOUNTER — Encounter (HOSPITAL_COMMUNITY): Payer: Self-pay

## 2023-06-24 ENCOUNTER — Emergency Department (HOSPITAL_COMMUNITY)

## 2023-06-24 DIAGNOSIS — I2510 Atherosclerotic heart disease of native coronary artery without angina pectoris: Secondary | ICD-10-CM | POA: Insufficient documentation

## 2023-06-24 DIAGNOSIS — Z981 Arthrodesis status: Secondary | ICD-10-CM | POA: Diagnosis not present

## 2023-06-24 DIAGNOSIS — R0789 Other chest pain: Secondary | ICD-10-CM | POA: Diagnosis not present

## 2023-06-24 DIAGNOSIS — R079 Chest pain, unspecified: Secondary | ICD-10-CM | POA: Diagnosis not present

## 2023-06-24 DIAGNOSIS — R61 Generalized hyperhidrosis: Secondary | ICD-10-CM | POA: Diagnosis not present

## 2023-06-24 LAB — BASIC METABOLIC PANEL WITH GFR
Anion gap: 7 (ref 5–15)
BUN: 13 mg/dL (ref 8–23)
CO2: 25 mmol/L (ref 22–32)
Calcium: 8.6 mg/dL — ABNORMAL LOW (ref 8.9–10.3)
Chloride: 104 mmol/L (ref 98–111)
Creatinine, Ser: 0.93 mg/dL (ref 0.61–1.24)
GFR, Estimated: >60 mL/min (ref >60)
Glucose, Bld: 113 mg/dL — ABNORMAL HIGH (ref 70–99)
Potassium: 3.5 mmol/L (ref 3.5–5.1)
Sodium: 136 mmol/L (ref 135–145)

## 2023-06-24 LAB — CBC
HCT: 45.3 % (ref 39.0–52.0)
Hemoglobin: 15.7 g/dL (ref 13.0–17.0)
MCH: 31.5 pg (ref 26.0–34.0)
MCHC: 34.7 g/dL (ref 30.0–36.0)
MCV: 90.8 fL (ref 80.0–100.0)
Platelets: 365 10*3/uL (ref 150–400)
RBC: 4.99 MIL/uL (ref 4.22–5.81)
RDW: 13.9 % (ref 11.5–15.5)
WBC: 6.9 10*3/uL (ref 4.0–10.5)
nRBC: 0 % (ref 0.0–0.2)

## 2023-06-24 LAB — TROPONIN I (HIGH SENSITIVITY)
Troponin I (High Sensitivity): 5 ng/L (ref <18)
Troponin I (High Sensitivity): 5 ng/L (ref <18)

## 2023-06-24 MED ORDER — ASPIRIN 81 MG PO CHEW
324.0000 mg | CHEWABLE_TABLET | ORAL | Status: AC
  Administered 2023-06-24: 324 mg via ORAL
  Filled 2023-06-24: qty 4

## 2023-06-24 NOTE — ED Triage Notes (Signed)
 Pt c/o intermittent chest pain for the past week. Pt states the pressure feeling in rt & lf chest that radiates through shoulder blades. Pt states symptoms for over a week with sweat outbreaks per pt.

## 2023-06-24 NOTE — Discharge Instructions (Signed)
 You were seen for your chest pain in the emergency department.   At home, please take tylenol  and aspirin  for your chest pain.    Follow-up with your primary doctor in 2-3 days regarding your visit.  Cardiology will be calling you regarding an appointment within the next 72 hours.  You may contact them if you do not hear from them in that time using the information in this packet.  Return immediately to the emergency department if you experience any of the following: Worsening pain, difficulty breathing, unexplained vomiting or sweating, or any other concerning symptoms.    Thank you for visiting our Emergency Department. It was a pleasure taking care of you today.

## 2023-06-24 NOTE — ED Provider Notes (Signed)
 [place] EMERGENCY DEPARTMENT AT [facility] Provider Note   CSN: [id number] Arrival date & time: 06/24/23  9528     History {Add pertinent medical, surgical, social history, OB history to HPI:1} Chief Complaint  Patient presents with   Chest Pain    [name] is a 73 y.o. male.  73 year old male with a history of CAD and chronic disease who presents emergency department chest pain.  Over the past 6 days has had chest discomfort that is starting him on his right shoulder and will occasionally radiate to his left shoulder and back.  Exertional.  Minimal at rest.  Says that he occasionally will get nauseous and lightheaded with it.  Also had an episode where he was attempting to walk and started breaking out into a cold sweat.  Says that he does not have stents in his heart but has been catheterized before.       Home Medications Prior to Admission medications   Medication Sig Start Date End Date Taking? Authorizing Provider  acetaminophen  (TYLENOL ) 650 MG CR tablet Take 1,300 mg by mouth every 8 (eight) hours as needed for pain.    [provider]  acetaminophen -codeine  (TYLENOL  #3) 300-30 MG tablet Take 1 tablet by mouth every 6 (six) hours as needed for moderate pain. 03/02/21   Darrin Emerald, MD  amLODipine  (NORVASC ) 5 MG tablet Take 1 tablet (5 mg total) by mouth daily. 03/28/13   Millicent Ally, MD  Artificial Tear Solution (SOOTHE XP) SOLN Place 1 drop into both eyes 2 (two) times daily as needed (dry eyes).    [provider]  ascorbic acid  (VITAMIN C) 500 MG tablet Take 1 tablet (500 mg total) by mouth daily. 11/14/19   Colin Dawley, MD  cholecalciferol (VITAMIN D3) 25 MCG (1000 UNIT) tablet Take 1,000 Units by mouth daily.    [provider]  MELATONIN PO Take 1 tablet by mouth at bedtime as needed (sleep).    [provider]  metoprolol  succinate (TOPROL -XL) 50 MG 24 hr tablet Take 50 mg by mouth daily. Take with or  immediately following a meal.    [provider]  nitroGLYCERIN  (NITROSTAT ) 0.4 MG SL tablet Place 1 tablet (0.4 mg total) under the tongue every 5 (five) minutes as needed for chest pain. 01/12/17   Millicent Ally, MD  Probiotic Product (PROBIOTIC DAILY PO) Take 1 capsule by mouth daily. SUPREMA DOPHILUS    [provider]  Psyllium Husk POWD Take 1 Scoop by mouth in the morning.    [provider]  ramipril  (ALTACE ) 10 MG capsule Take 1 capsule (10 mg total) by mouth daily. Please do NOT  Restart Ramipril  until 12/09/2019 12/09/19   Colin Dawley, MD  traZODone (DESYREL) 50 MG tablet Take 100 mg by mouth at bedtime as needed for sleep. 01/21/20   [provider]  zinc  sulfate 220 (50 Zn) MG capsule Take 1 capsule (220 mg total) by mouth daily. 11/14/19   Colin Dawley, MD      Allergies    Niacin, Penicillins, and Statins    Review of Systems   Review of Systems  Physical Exam Updated Vital Signs BP (!) 141/94 (BP Location: Right Arm)   Pulse 70   Temp 97.8 F (36.6 C) (Oral)   Resp 18   Ht 5\' 9"  (1.753 m)   Wt 99.8 kg   SpO2 94%   BMI 32.49 kg/m  Physical Exam Vitals and nursing note reviewed.  Constitutional:      General: He is not in acute distress.    Appearance: He is well-developed.  HENT:     Head: Normocephalic and atraumatic.     Right Ear: External ear normal.     Left Ear: External ear normal.     Nose: Nose normal.  Eyes:     Extraocular Movements: Extraocular movements intact.     Conjunctiva/sclera: Conjunctivae normal.     Pupils: Pupils are equal, round, and reactive to light.  Cardiovascular:     Rate and Rhythm: Normal rate and regular rhythm.     Heart sounds: Normal heart sounds.     Comments: Pulses 2+ bilaterally Pulmonary:     Effort: Pulmonary effort is normal. No respiratory distress.     Breath sounds: Normal breath sounds.  Abdominal:     General: There is no distension.     Palpations: Abdomen is  soft. There is no mass.     Tenderness: There is no abdominal tenderness. There is no guarding.  Musculoskeletal:     Cervical back: Normal range of motion and neck supple.     Right lower leg: Edema (1+) present.     Left lower leg: Edema (1+) present.  Skin:    General: Skin is warm and dry.  Neurological:     Mental Status: He is alert. Mental status is at baseline.  Psychiatric:        Mood and Affect: Mood normal.        Behavior: Behavior normal.     ED Results / Procedures / Treatments   Labs (all labs ordered are listed, but only abnormal results are displayed) Labs Reviewed  CBC  BASIC METABOLIC PANEL WITH GFR  TROPONIN I (HIGH SENSITIVITY)    EKG None  Radiology No results found.  Procedures Procedures  {Document cardiac monitor, telemetry assessment procedure when appropriate:1}  Medications Ordered in ED Medications - No data to display  ED Course/ Medical Decision Making/ A&P   {   Click here for ABCD2, HEART and other calculatorsREFRESH Note before signing :1}                              Medical Decision Making Amount and/or Complexity of Data Reviewed Labs: ordered. Radiology: ordered.  Risk OTC drugs.   ***  {Document critical care time when appropriate:1} {Document review of labs and clinical decision tools ie heart score, Chads2Vasc2 etc:1}  {Document your independent review of radiology images, and any outside records:1} {Document your discussion with family members, caretakers, and with consultants:1} {Document social determinants of health affecting pt's care:1} {Document your decision making why or why not admission, treatments were needed:1} Final Clinical Impression(s) / ED Diagnoses Final diagnoses:  None    Rx / DC Orders ED Discharge Orders     None

## 2023-06-26 DIAGNOSIS — H04123 Dry eye syndrome of bilateral lacrimal glands: Secondary | ICD-10-CM | POA: Diagnosis not present

## 2023-07-09 NOTE — Progress Notes (Deleted)
   Cardiology Office Note    Date:  07/09/2023  ID:  [name], DOB September 07, 1950, MRN [id number] Cardiologist: Previously Dr. Loetta Ringer  History of Present Illness:    [name] is a 73 y.o. male ***  ROS: ***  Studies Reviewed:   EKG: EKG is*** ordered today and demonstrates ***   EKG Interpretation Date/Time:    Ventricular Rate:    PR Interval:    QRS Duration:    QT Interval:    QTC Calculation:   R Axis:      Text Interpretation:         NST: 02/2013 IMPRESSION:  1. No convincing inducible myocardial ischemia.  2. Limited evaluation of the inferior wall due to movement and  overlap of the diaphragm and activity within the left lobe of the  liver.  3. Calculated ejection fraction of 47%.   Risk Assessment/Calculations:   {Does this patient have ATRIAL FIBRILLATION?[phone]} No BP recorded.  {Refresh Note OR Click here to enter BP  :1}***         Physical Exam:   VS:  There were no vitals taken for this visit.   Wt Readings from Last 3 Encounters:  06/24/23 220 lb (99.8 kg)  02/26/21 224 lb (101.6 kg)  02/17/21 224 lb (101.6 kg)     GEN: Well nourished, well developed in no acute distress NECK: No JVD; No carotid bruits CARDIAC: ***RRR, no murmurs, rubs, gallops RESPIRATORY:  Clear to auscultation without rales, wheezing or rhonchi  ABDOMEN: Appears non-distended. No obvious abdominal masses. EXTREMITIES: No clubbing or cyanosis. No edema.  Distal pedal pulses are 2+ bilaterally.   Assessment and Plan:      {Are you ordering a CV Procedure (e.g. stress test, cath, DCCV, TEE, etc)?   Press F2        :[id number]}   Signed, Dorma Gash, PA-C

## 2023-07-11 ENCOUNTER — Ambulatory Visit: Admitting: Student

## 2023-07-13 ENCOUNTER — Encounter: Payer: Self-pay | Admitting: Internal Medicine

## 2023-07-13 ENCOUNTER — Ambulatory Visit: Attending: Internal Medicine | Admitting: Internal Medicine

## 2023-07-13 VITALS — BP 138/78 | HR 72 | Ht 68.0 in | Wt 213.0 lb

## 2023-07-13 DIAGNOSIS — G4733 Obstructive sleep apnea (adult) (pediatric): Secondary | ICD-10-CM | POA: Diagnosis not present

## 2023-07-13 DIAGNOSIS — R079 Chest pain, unspecified: Secondary | ICD-10-CM | POA: Diagnosis not present

## 2023-07-13 DIAGNOSIS — Z789 Other specified health status: Secondary | ICD-10-CM | POA: Insufficient documentation

## 2023-07-13 MED ORDER — METOPROLOL TARTRATE 25 MG PO TABS
25.0000 mg | ORAL_TABLET | Freq: Two times a day (BID) | ORAL | 3 refills | Status: DC
Start: 2023-07-13 — End: 2023-07-13

## 2023-07-13 MED ORDER — RANOLAZINE ER 500 MG PO TB12: 500.0000 mg | ORAL_TABLET | Freq: Two times a day (BID) | ORAL | 3 refills | Status: DC

## 2023-07-13 NOTE — Patient Instructions (Signed)
 Medication Instructions:  Your physician has recommended you make the following change in your medication:   -Start Ranexa 500 mg tablets twice daily   -Inclisiran Injection What is this medication? INCLISIRAN (in kli SIR an) treats high cholesterol. It works by decreasing bad cholesterol (such as LDL) in your blood. Changes to diet and exercise are often combined with this medication. This medicine may be used for other purposes; ask your health care provider or pharmacist if you have questions. COMMON BRAND NAME(S): LEQVIO What should I tell my care team before I take this medication? They need to know if you have any of these conditions: An unusual or allergic reaction to inclisiran, other medications, foods, dyes, or preservatives Pregnant or trying to get pregnant Breast-feeding How should I use this medication? This medication is injected under the skin. It is given by your care team in a hospital or clinic setting. Talk to your care team about the use of this medication in children. Special care may be needed. Overdosage: If you think you have taken too much of this medicine contact a poison control center or emergency room at once. NOTE: This medicine is only for you. Do not share this medicine with others. What if I miss a dose? Keep appointments for follow-up doses. It is important not to miss your dose. Call your care team if you are unable to keep an appointment. What may interact with this medication? Interactions are not expected. This list may not describe all possible interactions. Give your health care provider a list of all the medicines, herbs, non-prescription drugs, or dietary supplements you use. Also tell them if you smoke, drink alcohol, or use illegal drugs. Some items may interact with your medicine. What should I watch for while using this medication? Visit your care team for regular checks on your progress. Tell your care team if your symptoms do not start to  get better or if they get worse. You may need blood work while you are taking this medication. What side effects may I notice from receiving this medication? Side effects that you should report to your care team as soon as possible: Allergic reactions--skin rash, itching, hives, swelling of the face, lips, tongue, or throat Side effects that usually do not require medical attention (report these to your care team if they continue or are bothersome): Joint pain Pain, redness, or irritation at injection site This list may not describe all possible side effects. Call your doctor for medical advice about side effects. You may report side effects to FDA at 1-800-FDA-1088. Where should I keep my medication? This medication is given in a hospital or clinic. It will not be stored at home. NOTE: This sheet is a summary. It may not cover all possible information. If you have questions about this medicine, talk to your doctor, pharmacist, or health care provider.  2024 Elsevier/Gold Standard (2023-01-06 00:00:00)  *If you need a refill on your cardiac medications before your next appointment, please call your pharmacy*  Lab Work: None If you have labs (blood work) drawn today and your tests are completely normal, you will receive your results only by: MyChart Message (if you have MyChart) OR A paper copy in the mail If you have any lab test that is abnormal or we need to change your treatment, we will call you to review the results.  Testing/Procedures: Your physician has requested that you have an echocardiogram. Echocardiography is a painless test that uses sound waves to create images of  your heart. It provides your doctor with information about the size and shape of your heart and how well your heart's chambers and valves are working. This procedure takes approximately one hour. There are no restrictions for this procedure. Please do NOT wear cologne, perfume, aftershave, or lotions (deodorant is  allowed). Please arrive 15 minutes prior to your appointment time.  Please note: We ask at that you not bring children with you during ultrasound (echo/ vascular) testing. Due to room size and safety concerns, children are not allowed in the ultrasound rooms during exams. Our front office staff cannot provide observation of children in our lobby area while testing is being conducted. An adult accompanying a patient to their appointment will only be allowed in the ultrasound room at the discretion of the ultrasound technician under special circumstances. We apologize for any inconvenience.   Follow-Up: At [facility], you and your health needs are our priority.  As part of our continuing mission to provide you with exceptional heart care, our providers are all part of one team.  This team includes your primary Cardiologist (physician) and Advanced Practice Providers or APPs (Physician Assistants and Nurse Practitioners) who all work together to provide you with the care you need, when you need it.  Your next appointment:    To be determined based on testing.   Provider:   You may see Vishnu P Mallipeddi, MD or one of the following Advanced Practice Providers on your designated Care Team:   [place], PA-C  [place], [place] Theotis Flake, [place]     We recommend signing up for the patient portal called "MyChart".  Sign up information is provided on this After Visit Summary.  MyChart is used to connect with patients for Virtual Visits (Telemedicine).  Patients are able to view lab/test results, encounter notes, upcoming appointments, etc.  Non-urgent messages can be sent to your provider as well.   To learn more about what you can do with MyChart, go to [email or web address].   Other Instructions

## 2023-07-13 NOTE — Progress Notes (Signed)
 Cardiology Office Note  Date: 07/13/2023   ID: Elmond, Poehlman 1950/12/12, MRN [id number]  PCP:  Kathyleen Parkins, MD  Cardiologist:  Lasalle Pointer, MD Electrophysiologist:  None   History of Present Illness: [name] is a 73 y.o. male  Referred post ER visit for chest pain.  Patient had inferior wall MI in 1994.  LHC at that time showed mid RCA CTO with left-to-right collaterals and nonobstructive CAD in LAD and Lcx (20 to 40% stenosis). He had jaw pain during his inferior wall MI in 1994.  He had similar jaw pain bilaterally in 2015 and 2012 where he underwent NM stress test that showed no evidence of inducible myocardial ischemia. He has severe OSA but not using CPAP as he is a side sleeper.  He reported having achiness in the chest radiating to his left shoulder, left neck and left jaw for the last couple of years. Initially less frequent but recently in the last 6 months or so, he has been noticing these episodes getting more frequent with over exertional activities like doing yard work, mowing the lawn, climbing stairs multiple times etc.  Frequency once a week.  The pain last for half a day and sometimes for 2 to 3 days.  He also has imaging evidence of hardware in his neck.  No imaging performed on his left shoulder.  He does not have angina with daily exertional activities.  Does not have DOE, dizziness, palpitations, syncope. He does have minimal leg swelling.   Past Medical History:  Diagnosis Date   Anxiety    Articular cartilage disorder involving shoulder region, left 11/2016   Bursitis of shoulder, left 11/2016   Complication of anesthesia    hx. of heart rate dropping during 2 surgeries   (2004- back surgery and with Lithotrispy )   Crohn's disease (HCC)    Depression    Dyspnea    with exertion   History of hepatitis    unknown type - 1970s   History of kidney stones    Hyperlipidemia    unable to tolerat statins   Hypertension    states under control  with meds., has been on med. since 1994   Immature cataract of both eyes    Limited joint range of motion    cervical spine   Myocardial infarct (HCC) 09/13/1992   Nonobstructive atherosclerosis of coronary artery    Numbness in both hands    Obesity    Osteoarthritis 11/2016   left shoulder   Rotator cuff tear, left 11/2016   Sleep apnea    uses BiPAP, but having issues with mask not sealing    Thyroid  nodule     Past Surgical History:  Procedure Laterality Date   ANAL FISTULOTOMY N/A 06/28/2013   Procedure: ANAL FISTULOTOMY;  Surgeon: Beau Bound, MD;  Location: [facility];  Service: General;  Laterality: N/A;   ANAL FISTULOTOMY  2017   ANTERIOR CERVICAL DECOMP/DISCECTOMY FUSION N/A 11/12/2015   Procedure: ANTERIOR CERVICAL DECOMPRESSION/DISCECTOMY FUSION CERVICAL THREE- CERVICAL FOUR, CERVICAL FOUR- CERVICAL FIVE;  Surgeon: Yvonna Herder, MD;  Location: [facility];  Service: Neurosurgery;  Laterality: N/A;  ANTERIOR CERVICAL DECOMPRESSION/DISCECTOMY FUSION C3-C4, C4-C5   BIOPSY THYROID      CARDIAC CATHETERIZATION  1994, 12/25/2007   a. PTCA alone in 1994 b. RCA CTO w/ L-R collaterals, 40-50% prox RCA, 20% prox LAD; EF 50-55%, inferoapical HK   CARDIAC CATHETERIZATION  04/02/2001   CARDIAC CATHETERIZATION  11/07/2000   CARDIAC  CATHETERIZATION  09/24/1996   CARPAL TUNNEL RELEASE Left 10/09/2014   Procedure: LEFT CARPAL TUNNEL RELEASE;  Surgeon: Brunilda Capra, MD;  Location: [facility];  Service: Orthopedics;  Laterality: Left;   CARPAL TUNNEL RELEASE Right 08/28/2003   CARPAL TUNNEL RELEASE Left 03/02/2021   Procedure: CARPAL TUNNEL RELEASE;  Surgeon: Darrin Emerald, MD;  Location: [facility];  Service: Orthopedics;  Laterality: Left;   CAUTERY OF TURBINATES  11/05/2003   COLONOSCOPY N/A 11/06/2014   RMR: Skipped Colonic ulcerations with significant ileocecal valve involvement and rectal sparing most consisitant with Crohns disease. Minimal non-steroidal drug use. Probable  colonic lipoma   CYSTECTOMY     INCISION AND DRAINAGE ABSCESS Right 05/23/2012   Procedure: INCISION AND DRAINAGE ABSCESS;  Surgeon: Beau Bound, MD;  Location: [facility];  Service: General;  Laterality: Right;   KNEE ARTHROSCOPY     LITHOTRIPSY     LUMBAR FUSION  02/18/2002   LUMBAR LAMINECTOMY  02/18/2002   L4-5   NASAL SEPTUM SURGERY  11/05/2003   PARTIAL NEPHRECTOMY Left    age 38   SHOULDER ARTHROSCOPY WITH SUBACROMIAL DECOMPRESSION, ROTATOR CUFF REPAIR AND BICEP TENDON REPAIR Left 11/30/2016   Procedure: LEFT SHOULDER ARTHROSCOPY, DEBRIDEMENT, ACROMIOPLASTY WITH DISTAL CLAVICAL EXCISION, POSSIBLE ROTATOR CUFF REPAIR AND BICEP TENODESIS;  Surgeon: Ferd Householder, MD;  Location: [facility];  Service: Orthopedics;  Laterality: Left;   SPLENECTOMY     age 62   TRANSTHORACIC ECHOCARDIOGRAM  03/05/2009   EF >55%, normal LV wall thickness.    Current Outpatient Medications  Medication Sig Dispense Refill   acetaminophen  (TYLENOL ) 650 MG CR tablet Take 1,300 mg by mouth every 8 (eight) hours as needed for pain.     acetaminophen -codeine  (TYLENOL  #3) 300-30 MG tablet Take 1 tablet by mouth every 6 (six) hours as needed for moderate pain. 30 tablet 0   amLODipine  (NORVASC ) 5 MG tablet Take 1 tablet (5 mg total) by mouth daily. 180 tablet 3   Artificial Tear Solution (SOOTHE XP) SOLN Place 1 drop into both eyes 2 (two) times daily as needed (dry eyes).     ascorbic acid  (VITAMIN C) 500 MG tablet Take 1 tablet (500 mg total) by mouth daily. 30 tablet 2   cholecalciferol (VITAMIN D3) 25 MCG (1000 UNIT) tablet Take 1,000 Units by mouth daily.     MELATONIN PO Take 1 tablet by mouth at bedtime as needed (sleep).     metoprolol  succinate (TOPROL -XL) 50 MG 24 hr tablet Take 50 mg by mouth daily. Take with or immediately following a meal.     nitroGLYCERIN  (NITROSTAT ) 0.4 MG SL tablet Place 1 tablet (0.4 mg total) under the tongue every 5 (five) minutes as needed for chest pain. 25 tablet 3   Probiotic  Product (PROBIOTIC DAILY PO) Take 1 capsule by mouth daily. SUPREMA DOPHILUS     Psyllium Husk POWD Take 1 Scoop by mouth in the morning.     ramipril  (ALTACE ) 10 MG capsule Take 1 capsule (10 mg total) by mouth daily. Please do NOT  Restart Ramipril  until 12/09/2019 30 capsule 0   traZODone (DESYREL) 50 MG tablet Take 100 mg by mouth at bedtime as needed for sleep.     zinc  sulfate 220 (50 Zn) MG capsule Take 1 capsule (220 mg total) by mouth daily. 30 capsule 0   No current facility-administered medications for this visit.   Allergies:  Niacin, Penicillins, and Statins   Social History: The patient  reports that  he quit smoking about 30 years ago. His smoking use included cigarettes. He started smoking about 48 years ago. He has a 36 pack-year smoking history. He has quit using smokeless tobacco. He reports that he does not drink alcohol and does not use drugs.   Family History: The patient's family history includes Alzheimer's disease in his maternal grandmother; Arthritis/Rheumatoid in his mother; CAD in his brother, brother, and father; COPD in his son; Hyperlipidemia in his mother; Lung disease in his paternal grandfather; Thrombocytopenia in his mother.   ROS:  Please see the history of present illness. Otherwise, complete review of systems is positive for none.  All other systems are reviewed and negative.   Physical Exam: VS:  BP 138/78 (BP Location: Left Arm, Patient Position: Sitting, Cuff Size: Normal)   Pulse 72   Ht 5\' 8"  (1.727 m)   Wt 213 lb (96.6 kg)   BMI 32.39 kg/m , BMI Body mass index is 32.39 kg/m.  Wt Readings from Last 3 Encounters:  07/13/23 213 lb (96.6 kg)  06/24/23 220 lb (99.8 kg)  02/26/21 224 lb (101.6 kg)    General: Patient appears comfortable at rest. HEENT: Conjunctiva and lids normal, oropharynx clear with moist mucosa. Neck: Supple, no elevated JVP or carotid bruits, no thyromegaly. Lungs: Clear to auscultation, nonlabored breathing at  rest. Cardiac: Regular rate and rhythm, no S3 or significant systolic murmur, no pericardial rub. Abdomen: Soft, nontender, no hepatomegaly, bowel sounds present, no guarding or rebound. Extremities: No pitting edema, distal pulses 2+. Skin: Warm and dry. Musculoskeletal: No kyphosis. Neuropsychiatric: Alert and oriented x3, affect grossly appropriate.  Recent Labwork: 06/24/2023: BUN 13; Creatinine, Ser 0.93; Hemoglobin 15.7; Platelets 365; Potassium 3.5; Sodium 136     Component Value Date/Time   CHOL 117 12/29/2017 0812   TRIG 63 11/12/2019 1200   HDL 34 (L) 12/29/2017 0812   CHOLHDL 5.5 (H) 03/16/2017 0857   CHOLHDL 6.0 06/10/2014 0811   VLDL 34 06/10/2014 0811   LDLCALC 58 12/29/2017 0812     Assessment and Plan:  Cardiac chest pain: Ongoing exertional angina x couple of years that is worsening recently, occurring once a week and lasting for at least half a day and sometimes 2 to 3 days.  Angina occurs only with overexertion activities like mowing the lawn, yard work, climbing stairs multiple times etc.  Does not have SL NTG at home.  Gets headaches with SL NTG.  Already on antianginal therapy, metoprolol  succinate 50 mg once daily, amlodipine  5 mg once daily.  Will start ranolazine 500 mg twice daily.  EKG showed NSR, Q waves in inferior leads and no new ischemia.  He is instructed to make an appointment with PCP to rule out any musculoskeletal causes of chest pain as his neck/shoulder pains are reproducible with changes in the neck/arm movement.  LHC 9094 showed mid RCA CTO with left-to-right collaterals and nonobstructive CAD in the remaining vessels, NM stress test in 2012 and 2015 showed no evidence of inducible myocardial ischemia.  Will see him back in 1 month to reevaluate his symptoms.  He will need NM stress test or LHC in the next few months to rule out any LAD ischemia.  Obtain echocardiogram.  His LVEF was 45 to 50% in 1990s.  CAD (mid RCA CTO with left-to-right collaterals  and nonobstructive CAD in the remaining vessels in 1994): Has stable angina, see plan above.  Taking aspirin  81 mg once daily at home.  He has Crohn's disease.  No  issues so far.  Has been statin intolerant, see plan below.  HLD, not at goal: LDL 173 in July 2024.  TG levels are normal.  Currently not on any statins.  He tried multiple statins in the past with myopathy.  He also tried Zetia , did not tolerate.  He also tried Repatha  and Praluent , had rash.  I discussed about initiating inclisiran, nonstatin medication.  Discussed side effects.  He wants to research this medication and then his daughter will reach out to us  about the decision.  Goal LDL will need to be less than 70.  HTN, controlled: Continue current antihypertensives, ramipril  10 mg once daily, metoprolol  succinate 50 mg once daily and amlodipine  5 mg once daily.  Severe OSA not on CPAP: He sleeps on his side.  Does not want to use CPAP.  Last follow-up with Dr. Loetta Ringer, sleep medicine.  Will have him see Dr. Micael Adas.   Medication Adjustments/Labs and Tests Ordered: Current medicines are reviewed at length with the patient today.  Concerns regarding medicines are outlined above.    Disposition:  Follow up 1 month  Signed, Angeline Trick Beauford Bounds, MD, 07/13/2023 8:56 AM    [facility] [address] [place], [place] [address]

## 2023-08-09 ENCOUNTER — Ambulatory Visit (HOSPITAL_COMMUNITY)
Admission: RE | Admit: 2023-08-09 | Discharge: 2023-08-09 | Disposition: A | Discharge status: Home / Self Care | Source: Ambulatory Visit | Attending: Internal Medicine | Admitting: Internal Medicine

## 2023-08-09 DIAGNOSIS — R079 Chest pain, unspecified: Secondary | ICD-10-CM | POA: Diagnosis not present

## 2023-08-09 LAB — ECHOCARDIOGRAM COMPLETE
Area-P 1/2: 2.91 cm2
S' Lateral: 3.5 cm

## 2023-08-09 NOTE — Progress Notes (Signed)
*  PRELIMINARY RESULTS* Echocardiogram 2D Echocardiogram has been performed.  [name] 08/09/2023, 10:23 AM

## 2023-08-14 DIAGNOSIS — I10 Essential (primary) hypertension: Secondary | ICD-10-CM | POA: Diagnosis not present

## 2023-08-14 DIAGNOSIS — M2550 Pain in unspecified joint: Secondary | ICD-10-CM | POA: Diagnosis not present

## 2023-08-14 DIAGNOSIS — L408 Other psoriasis: Secondary | ICD-10-CM | POA: Diagnosis not present

## 2023-08-14 DIAGNOSIS — K5090 Crohn's disease, unspecified, without complications: Secondary | ICD-10-CM | POA: Diagnosis not present

## 2023-08-14 DIAGNOSIS — M159 Polyosteoarthritis, unspecified: Secondary | ICD-10-CM | POA: Diagnosis not present

## 2023-08-14 DIAGNOSIS — D518 Other vitamin B12 deficiency anemias: Secondary | ICD-10-CM | POA: Diagnosis not present

## 2023-08-14 DIAGNOSIS — M353 Polymyalgia rheumatica: Secondary | ICD-10-CM | POA: Diagnosis not present

## 2023-08-14 DIAGNOSIS — M1991 Primary osteoarthritis, unspecified site: Secondary | ICD-10-CM | POA: Diagnosis not present

## 2023-08-14 DIAGNOSIS — G629 Polyneuropathy, unspecified: Secondary | ICD-10-CM | POA: Diagnosis not present

## 2023-08-23 ENCOUNTER — Encounter: Payer: Self-pay | Admitting: Orthopedic Surgery

## 2023-08-23 NOTE — Progress Notes (Signed)
 Referring Physician:  Bertell Satterfield, MD [address] [place],  [place] [address]  Primary Physician:  Bertell Satterfield, MD  History of Present Illness: 08/29/2023 [name] has a history of HTN, CAD, OSA, dyslipidemia, obesity, crohn's, MI, CAD.   History of ACDF C3-C5 11/12/15 with Dr. Nudleman. Also with history of lumbar fusion. History of left ulnar neuropathy and bilateral CTS. He had bilateral CTR with recent repeat left CTR 03/02/21 with Dr. Margrette.   He has 5 year history of constant neck pain with left > right arm pain to his hands  He is having dexterity issues in left > right hand- he notes locking up of his fingers in both hands. He has difficulty picking up items. His neck is stiff. He has numbness, tingling, and weakness. 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 Some better with rest.   He takes prn tylenol  arthritis.   Tobacco use: Does not smoke.   Bowel/Bladder Dysfunction: none  Conservative measures:  Physical therapy:  has not participated in PT Multimodal medical therapy including regular antiinflammatories:  Tylenol , Tizanidine, Medrol  Dose Pak Injections:  no epidural steroid injections  Past Surgery:  11/12/2015-ACDF C3-C5 02/18/2002-LUMBAR LAMINECTOMY and FUSION L4-L5  JOREL GRAVLIN has some dexterity issues in both hands. No balance issues.   The symptoms are causing a significant impact on the patient's life.   Review of Systems:  A 10 point review of systems is negative, except for the pertinent positives and negatives detailed in the HPI.  Past Medical History: Past Medical History:  Diagnosis Date   Anxiety    Articular cartilage disorder involving shoulder region, left 11/2016   Bursitis of shoulder, left 11/2016   Complication of anesthesia    hx. of heart rate dropping during 2 surgeries   (2004- back surgery and with Lithotrispy )   Crohn's disease (HCC)    Depression    Dyspnea    with exertion    History of hepatitis    unknown type - 1970s   History of kidney stones    Hyperlipidemia    unable to tolerat statins   Hypertension    states under control with meds., has been on med. since 1994   Immature cataract of both eyes    Limited joint range of motion    cervical spine   Myocardial infarct (HCC) 09/13/1992   Nonobstructive atherosclerosis of coronary artery    Numbness in both hands    Obesity    Osteoarthritis 11/2016   left shoulder   Rotator cuff tear, left 11/2016   Sleep apnea    uses BiPAP, but having issues with mask not sealing    Thyroid  nodule     Past Surgical History: Past Surgical History:  Procedure Laterality Date   ANAL FISTULOTOMY N/A 06/28/2013   Procedure: ANAL FISTULOTOMY;  Surgeon: Oneil DELENA Budge, MD;  Location: [facility];  Service: General;  Laterality: N/A;   ANAL FISTULOTOMY  2017   ANTERIOR CERVICAL DECOMP/DISCECTOMY FUSION N/A 11/12/2015   Procedure: ANTERIOR CERVICAL DECOMPRESSION/DISCECTOMY FUSION CERVICAL THREE- CERVICAL FOUR, CERVICAL FOUR- CERVICAL FIVE;  Surgeon: Lamar Peaches, MD;  Location: [facility];  Service: Neurosurgery;  Laterality: N/A;  ANTERIOR CERVICAL DECOMPRESSION/DISCECTOMY FUSION C3-C4, C4-C5   BIOPSY THYROID      CARDIAC CATHETERIZATION  1994, 12/25/2007   a. PTCA alone in 1994 b. RCA CTO w/ L-R collaterals, 40-50% prox RCA, 20% prox LAD; EF 50-55%, inferoapical HK   CARDIAC CATHETERIZATION  04/02/2001  CARDIAC CATHETERIZATION  11/07/2000   CARDIAC CATHETERIZATION  09/24/1996   CARPAL TUNNEL RELEASE Left 10/09/2014   Procedure: LEFT CARPAL TUNNEL RELEASE;  Surgeon: Franky Curia, MD;  Location: [facility];  Service: Orthopedics;  Laterality: Left;   CARPAL TUNNEL RELEASE Right 08/28/2003   CARPAL TUNNEL RELEASE Left 03/02/2021   Procedure: CARPAL TUNNEL RELEASE;  Surgeon: Margrette Taft BRAVO, MD;  Location: [facility];  Service: Orthopedics;  Laterality: Left;   CAUTERY OF TURBINATES  11/05/2003   COLONOSCOPY  N/A 11/06/2014   RMR: Skipped Colonic ulcerations with significant ileocecal valve involvement and rectal sparing most consisitant with Crohns disease. Minimal non-steroidal drug use. Probable colonic lipoma   CYSTECTOMY     INCISION AND DRAINAGE ABSCESS Right 05/23/2012   Procedure: INCISION AND DRAINAGE ABSCESS;  Surgeon: Oneil DELENA Budge, MD;  Location: [facility];  Service: General;  Laterality: Right;   KNEE ARTHROSCOPY     LITHOTRIPSY     LUMBAR FUSION  02/18/2002   LUMBAR LAMINECTOMY  02/18/2002   L4-5   NASAL SEPTUM SURGERY  11/05/2003   PARTIAL NEPHRECTOMY Left    age 73   SHOULDER ARTHROSCOPY WITH SUBACROMIAL DECOMPRESSION, ROTATOR CUFF REPAIR AND BICEP TENDON REPAIR Left 11/30/2016   Procedure: LEFT SHOULDER ARTHROSCOPY, DEBRIDEMENT, ACROMIOPLASTY WITH DISTAL CLAVICAL EXCISION, POSSIBLE ROTATOR CUFF REPAIR AND BICEP TENODESIS;  Surgeon: Beverley Toribio FALCON, MD;  Location: [facility];  Service: Orthopedics;  Laterality: Left;   SPLENECTOMY     age 88   TRANSTHORACIC ECHOCARDIOGRAM  03/05/2009   EF >55%, normal LV wall thickness.    Allergies: Allergies as of 08/29/2023 - Review Complete 08/29/2023  Allergen Reaction Noted   Niacin Other (See Comments) 05/22/2012   Penicillins Hives, Itching, and Other (See Comments)    Statins Other (See Comments) 03/28/2013    Medications: Outpatient Encounter Medications as of 08/29/2023  Medication Sig   acetaminophen  (TYLENOL ) 650 MG CR tablet Take 1,300 mg by mouth every 8 (eight) hours as needed for pain.   acetaminophen -codeine  (TYLENOL  #3) 300-30 MG tablet Take 1 tablet by mouth every 6 (six) hours as needed for moderate pain.   amLODipine  (NORVASC ) 5 MG tablet Take 1 tablet (5 mg total) by mouth daily.   Artificial Tear Solution (SOOTHE XP) SOLN Place 1 drop into both eyes 2 (two) times daily as needed (dry eyes).   ascorbic acid  (VITAMIN C) 500 MG tablet Take 1 tablet (500 mg total) by mouth daily.   aspirin  81 MG chewable tablet Chew by  mouth daily.   cholecalciferol (VITAMIN D3) 25 MCG (1000 UNIT) tablet Take 1,000 Units by mouth daily.   MELATONIN PO Take 1 tablet by mouth at bedtime as needed (sleep).   metoprolol  succinate (TOPROL -XL) 50 MG 24 hr tablet Take 50 mg by mouth daily. Take with or immediately following a meal.   nitroGLYCERIN  (NITROSTAT ) 0.4 MG SL tablet Place 1 tablet (0.4 mg total) under the tongue every 5 (five) minutes as needed for chest pain.   Probiotic Product (PROBIOTIC DAILY PO) Take 1 capsule by mouth daily. SUPREMA DOPHILUS   Psyllium Husk POWD Take 1 Scoop by mouth in the morning.   ramipril  (ALTACE ) 10 MG capsule Take 1 capsule (10 mg total) by mouth daily. Please do NOT  Restart Ramipril  until 12/09/2019   ranolazine  (RANEXA ) 500 MG 12 hr tablet Take 1 tablet (500 mg total) by mouth 2 (two) times daily.   traZODone (DESYREL) 50 MG tablet Take 100 mg by mouth at bedtime as  needed for sleep.   zinc  sulfate 220 (50 Zn) MG capsule Take 1 capsule (220 mg total) by mouth daily.   No facility-administered encounter medications on file as of 08/29/2023.    Social History: Social History   Tobacco Use   Smoking status: Former    Current packs/day: 0.00    Average packs/day: 2.0 packs/day for 18.0 years (36.0 ttl pk-yrs)    Types: Cigarettes    Start date: 02/07/1975    Quit date: 02/06/1993    Years since quitting: 30.5   Smokeless tobacco: Former   Tobacco comments:    Chew twice yearly  Vaping Use   Vaping status: Never Used  Substance Use Topics   Alcohol use: No    Alcohol/week: 0.0 standard drinks of alcohol   Drug use: No    Family Medical History: Family History  Problem Relation Age of Onset   CAD Father        CABG    CAD Brother        CABG in 36s   CAD Brother    Arthritis/Rheumatoid Mother    Hyperlipidemia Mother    Thrombocytopenia Mother    COPD Son        smoker   Alzheimer's disease Maternal Grandmother    Lung disease Paternal Grandfather     Physical  Examination: Vitals:   08/29/23 0846  BP: (!) 148/82    General: Patient is well developed, well nourished, calm, collected, and in no apparent distress. Attention to examination is appropriate.  Respiratory: Patient is breathing without any difficulty.   NEUROLOGICAL:     Awake, alert, oriented to person, place, and time.  Speech is clear and fluent. Fund of knowledge is appropriate.   Cranial Nerves: Pupils equal round and reactive to light.  Facial tone is symmetric.    Well healed anterior cervical incision.   No posterior cervical tenderness. Mild tenderness in left trapezial region.   No abnormal lesions on exposed skin.   Strength: Side Biceps Triceps Deltoid Interossei Grip Wrist Ext. Wrist Flex.  R 5 5 5 5 5 5 5   L 5 5 5 5 5 5 5    Side Iliopsoas Quads Hamstring PF DF EHL  R 5 5 5 5 5 5   L 5 5 5 5 5 5    Reflexes are 1+ and symmetric at the biceps, brachioradialis, patella and achilles.   Hoffman's is absent.  Clonus is not present.   Bilateral upper and lower extremity sensation is intact to light touch.     He has triggering in his left middle and ring finger. Tender over A1 pulley.   Gait is normal.     Medical Decision Making  Imaging: No recent cervical imaging.   Assessment and Plan: Mr. Bunt has a history of ACDF C3-C5 11/12/15 with Dr. Nudleman. Also with history of lumbar fusion.   History of left ulnar neuropathy and bilateral CTS. He had bilateral CTR with recent repeat left CTR 03/02/21 with Dr. Margrette.   He has 5 year history of constant neck pain with left > right arm pain to his hands. He is also having dexterity issues in left > right hand- he has difficulty picking up items. No balance issues.   He has trigger fingers in left middle and ring finger.   Treatment options discussed with patient and following plan made:   - MRI of cervical spine to evaluate neck pain/dexterity issues.  - When he gets MRI done, he will get  cervical xrays  with flex/ext.  - Referral to ortho at [facility] for left middle/ring trigger fingers.  - May consider EMG/NCS of upper extremities at some point as well- was told he had pinched nerve in right hand.  - Will schedule follow up visit to review MRI/xray results once I get them back.   I spent a total of 45 minutes in face-to-face and non-face-to-face activities related to this patient's care today including review of outside records, review of imaging, review of symptoms, physical exam, discussion of differential diagnosis, discussion of treatment options, and documentation.   Thank you for involving me in the care of this patient.   Glade Boys PA-C Dept. of Neurosurgery

## 2023-08-29 ENCOUNTER — Ambulatory Visit: Admitting: Orthopedic Surgery

## 2023-08-29 ENCOUNTER — Encounter: Payer: Self-pay | Admitting: Orthopedic Surgery

## 2023-08-29 VITALS — BP 134/76 | Ht 68.0 in | Wt 216.0 lb

## 2023-08-29 DIAGNOSIS — M65342 Trigger finger, left ring finger: Secondary | ICD-10-CM

## 2023-08-29 DIAGNOSIS — M5412 Radiculopathy, cervical region: Secondary | ICD-10-CM

## 2023-08-29 DIAGNOSIS — M542 Cervicalgia: Secondary | ICD-10-CM

## 2023-08-29 DIAGNOSIS — Z981 Arthrodesis status: Secondary | ICD-10-CM

## 2023-08-29 DIAGNOSIS — R29898 Other symptoms and signs involving the musculoskeletal system: Secondary | ICD-10-CM | POA: Diagnosis not present

## 2023-08-29 DIAGNOSIS — M65332 Trigger finger, left middle finger: Secondary | ICD-10-CM | POA: Diagnosis not present

## 2023-08-29 NOTE — Patient Instructions (Signed)
 It was so nice to see you today. Thank you so much for coming in.    I think your neck and arm pain is likely from your neck. Trigger fingers are not from your neck.   I want to get an MRI of your neck to look into things further. We will get this approved through your insurance and Zelda Salmon will call you to schedule the appointment. Ask about your patient responsibility. You do not need to pay this prior to getting MRI, they can bill you.   Remind them to get xrays of your neck when you get the MRI done.   After you have the MRI/xrays, it can take 14-28 days for me to get the results back. If I don't have them in 2 weeks, we will call to try to get the results.   Once I have the results, we will call you to schedule a follow up visit with me to review them.   I want you to see ortho at the [facility] clinic for evaluation of your left trigger fingers. They should call you to schedule an appointment or you can call them at [phone].   Please do not hesitate to call if you have any questions or concerns. You can also message me in MyChart.   Glade Boys PA-C [phone]     The physicians and staff at [facility] at [facility] are committed to providing excellent care. You may receive a survey asking for feedback about your experience at our office. We value you your feedback and appreciate you taking the time to to fill it out. The [facility] leadership team is also available to discuss your experience in person, feel free to contact us  [phone].

## 2023-09-02 ENCOUNTER — Ambulatory Visit (HOSPITAL_COMMUNITY)
Admission: RE | Admit: 2023-09-02 | Discharge: 2023-09-02 | Disposition: A | Discharge status: Home / Self Care | Source: Ambulatory Visit | Attending: Orthopedic Surgery | Admitting: Orthopedic Surgery

## 2023-09-02 DIAGNOSIS — R29898 Other symptoms and signs involving the musculoskeletal system: Secondary | ICD-10-CM | POA: Diagnosis not present

## 2023-09-02 DIAGNOSIS — Z981 Arthrodesis status: Secondary | ICD-10-CM | POA: Insufficient documentation

## 2023-09-02 DIAGNOSIS — M542 Cervicalgia: Secondary | ICD-10-CM | POA: Diagnosis not present

## 2023-09-02 DIAGNOSIS — M47812 Spondylosis without myelopathy or radiculopathy, cervical region: Secondary | ICD-10-CM | POA: Diagnosis not present

## 2023-09-02 DIAGNOSIS — M5412 Radiculopathy, cervical region: Secondary | ICD-10-CM | POA: Diagnosis not present

## 2023-09-02 DIAGNOSIS — M50223 Other cervical disc displacement at C6-C7 level: Secondary | ICD-10-CM | POA: Diagnosis not present

## 2023-09-02 DIAGNOSIS — M5023 Other cervical disc displacement, cervicothoracic region: Secondary | ICD-10-CM | POA: Diagnosis not present

## 2023-09-04 ENCOUNTER — Ambulatory Visit: Payer: Self-pay | Admitting: Internal Medicine

## 2023-09-09 ENCOUNTER — Ambulatory Visit (HOSPITAL_COMMUNITY)
Admission: RE | Admit: 2023-09-09 | Discharge: 2023-09-09 | Disposition: A | Discharge status: Home / Self Care | Source: Ambulatory Visit | Attending: Orthopedic Surgery | Admitting: Orthopedic Surgery

## 2023-09-09 DIAGNOSIS — M542 Cervicalgia: Secondary | ICD-10-CM | POA: Insufficient documentation

## 2023-09-09 DIAGNOSIS — Z981 Arthrodesis status: Secondary | ICD-10-CM | POA: Diagnosis not present

## 2023-09-09 DIAGNOSIS — M4802 Spinal stenosis, cervical region: Secondary | ICD-10-CM | POA: Diagnosis not present

## 2023-09-09 DIAGNOSIS — Z4789 Encounter for other orthopedic aftercare: Secondary | ICD-10-CM | POA: Diagnosis not present

## 2023-09-09 DIAGNOSIS — M5412 Radiculopathy, cervical region: Secondary | ICD-10-CM | POA: Diagnosis not present

## 2023-09-09 DIAGNOSIS — M50323 Other cervical disc degeneration at C6-C7 level: Secondary | ICD-10-CM | POA: Diagnosis not present

## 2023-09-09 DIAGNOSIS — R29898 Other symptoms and signs involving the musculoskeletal system: Secondary | ICD-10-CM | POA: Insufficient documentation

## 2023-09-20 ENCOUNTER — Telehealth: Payer: Self-pay | Admitting: Orthopedic Surgery

## 2023-09-20 NOTE — Telephone Encounter (Signed)
 He had cervical xrays at [facility]. They did obliques instead of flex/ext xrays. Please arrange for him to go back to get flex/ext views at no charge.   Also, please schedule him a f/u with me to review his imaging.   Thanks!

## 2023-09-20 NOTE — Telephone Encounter (Signed)
 Spoke with radiology at [facility] and they will get in touch with patient and get him to come back for xrays to re do the imaging. I was advised that it would be helpful to place the actual orders for Flex/ext for patients so this way the right imaging is done, they do try to look at the comments and apologized that they overlooked this but it would be helpful to have the actual orders placed in the chart as what is needed. There is an [id number] order for Flex/ext and [id number] that has all the views needed (ap/lat/flex/ext/obloquieq/etc.  I called and left detailed message for the patient letting him know that Zelda Salmon will be calling him to get him to go back for xrays and that we need to schedule a follow up and to call us  back. I apologized to him that he has to go back for another xray also.

## 2023-09-21 NOTE — Telephone Encounter (Signed)
 Called patient and his daughter Lorie per DPR on file, no answer not able to leave a message. Sent mychart message to the patient

## 2023-09-26 NOTE — Progress Notes (Unsigned)
 Referring Physician:  Bertell Satterfield, MD [address] [place],  [place] [address]  Primary Physician:  Bertell Satterfield, MD  History of Present Illness: 09/27/2023 [name] has a history of HTN, CAD, OSA, dyslipidemia, obesity, crohn's, MI, CAD.   History of ACDF C3-C5 11/12/15 with Dr. Nudleman. Also with history of lumbar fusion. History of left ulnar neuropathy and bilateral CTS. He had bilateral CTR with recent repeat left CTR 03/02/21 with Dr. Margrette.   Last seen by me on 08/29/23 for constant neck pain with bilateral arm pain. He was also having dexterity issues in left > right hand- he has difficulty picking up items. No balance issues.   He was sent to ortho at [facility] for trigger fingers of left hand. He is being set up for surgery.   He is here to review his cervical MRI and xrays.   He continues with constant neck pain with left > right arm pain to his hands x years. Pain is mostly tolerable. Since his previous neck surgery, he has chronic dexterity issues in left > right hand. He has difficulty picking up items. He has numbness, tingling, and weakness in both arms. 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 Some better with rest.   Does not think dexterity issues are worse. No balance issues.   He takes prn tylenol  arthritis.   Tobacco use: Does not smoke.   Conservative measures:  Physical therapy:  has not participated in PT Multimodal medical therapy including regular antiinflammatories:  Tylenol , Tizanidine, Medrol  Dose Pak Injections:  no epidural steroid injections  Past Surgery:  11/12/2015-ACDF C3-C5 02/18/2002-LUMBAR LAMINECTOMY and FUSION L4-L5  [name] has some dexterity issues in both hands. No balance issues.   The symptoms are causing a significant impact on the patient's life.   Review of Systems:  A 10 point review of systems is negative, except for the pertinent positives and negatives detailed in the  HPI.  Past Medical History: Past Medical History:  Diagnosis Date   Anxiety    Articular cartilage disorder involving shoulder region, left 11/2016   Bursitis of shoulder, left 11/2016   Complication of anesthesia    hx. of heart rate dropping during 2 surgeries   (2004- back surgery and with Lithotrispy )   Crohn's disease (HCC)    Depression    Dyspnea    with exertion   History of hepatitis    unknown type - 1970s   History of kidney stones    Hyperlipidemia    unable to tolerat statins   Hypertension    states under control with meds., has been on med. since 1994   Immature cataract of both eyes    Limited joint range of motion    cervical spine   Myocardial infarct (HCC) 09/13/1992   Nonobstructive atherosclerosis of coronary artery    Numbness in both hands    Obesity    Osteoarthritis 11/2016   left shoulder   Rotator cuff tear, left 11/2016   Sleep apnea    uses BiPAP, but having issues with mask not sealing    Thyroid  nodule     Past Surgical History: Past Surgical History:  Procedure Laterality Date   ANAL FISTULOTOMY N/A 06/28/2013   Procedure: ANAL FISTULOTOMY;  Surgeon: Oneil DELENA Budge, MD;  Location: [facility];  Service: General;  Laterality: N/A;   ANAL FISTULOTOMY  2017   ANTERIOR CERVICAL DECOMP/DISCECTOMY FUSION N/A 11/12/2015   Procedure: ANTERIOR CERVICAL  DECOMPRESSION/DISCECTOMY FUSION CERVICAL THREE- CERVICAL FOUR, CERVICAL FOUR- CERVICAL FIVE;  Surgeon: Lamar Peaches, MD;  Location: [facility] OR;  Service: Neurosurgery;  Laterality: N/A;  ANTERIOR CERVICAL DECOMPRESSION/DISCECTOMY FUSION C3-C4, C4-C5   BIOPSY THYROID      CARDIAC CATHETERIZATION  1994, 12/25/2007   a. PTCA alone in 1994 b. RCA CTO w/ L-R collaterals, 40-50% prox RCA, 20% prox LAD; EF 50-55%, inferoapical HK   CARDIAC CATHETERIZATION  04/02/2001   CARDIAC CATHETERIZATION  11/07/2000   CARDIAC CATHETERIZATION  09/24/1996   CARPAL TUNNEL RELEASE Left 10/09/2014   Procedure: LEFT CARPAL  TUNNEL RELEASE;  Surgeon: Franky Curia, MD;  Location: [facility];  Service: Orthopedics;  Laterality: Left;   CARPAL TUNNEL RELEASE Right 08/28/2003   CARPAL TUNNEL RELEASE Left 03/02/2021   Procedure: CARPAL TUNNEL RELEASE;  Surgeon: Margrette Taft BRAVO, MD;  Location: [facility];  Service: Orthopedics;  Laterality: Left;   CAUTERY OF TURBINATES  11/05/2003   COLONOSCOPY N/A 11/06/2014   RMR: Skipped Colonic ulcerations with significant ileocecal valve involvement and rectal sparing most consisitant with Crohns disease. Minimal non-steroidal drug use. Probable colonic lipoma   CYSTECTOMY     INCISION AND DRAINAGE ABSCESS Right 05/23/2012   Procedure: INCISION AND DRAINAGE ABSCESS;  Surgeon: Oneil DELENA Budge, MD;  Location: [facility];  Service: General;  Laterality: Right;   KNEE ARTHROSCOPY     LITHOTRIPSY     LUMBAR FUSION  02/18/2002   LUMBAR LAMINECTOMY  02/18/2002   L4-5   NASAL SEPTUM SURGERY  11/05/2003   PARTIAL NEPHRECTOMY Left    age 70   SHOULDER ARTHROSCOPY WITH SUBACROMIAL DECOMPRESSION, ROTATOR CUFF REPAIR AND BICEP TENDON REPAIR Left 11/30/2016   Procedure: LEFT SHOULDER ARTHROSCOPY, DEBRIDEMENT, ACROMIOPLASTY WITH DISTAL CLAVICAL EXCISION, POSSIBLE ROTATOR CUFF REPAIR AND BICEP TENODESIS;  Surgeon: Beverley Toribio FALCON, MD;  Location: [facility] OR;  Service: Orthopedics;  Laterality: Left;   SPLENECTOMY     age 45   TRANSTHORACIC ECHOCARDIOGRAM  03/05/2009   EF >55%, normal LV wall thickness.    Allergies: Allergies as of 09/27/2023 - Review Complete 09/27/2023  Allergen Reaction Noted   Niacin Other (See Comments) 05/22/2012   Penicillins Hives, Itching, and Other (See Comments)    Statins Other (See Comments) 03/28/2013    Medications: Outpatient Encounter Medications as of 09/27/2023  Medication Sig   acetaminophen  (TYLENOL ) 650 MG CR tablet Take 1,300 mg by mouth every 8 (eight) hours as needed for pain.   amLODipine  (NORVASC ) 5 MG tablet Take 1 tablet (5 mg total)  by mouth daily.   Artificial Tear Solution (SOOTHE XP) SOLN Place 1 drop into both eyes 2 (two) times daily as needed (dry eyes).   ascorbic acid  (VITAMIN C) 500 MG tablet Take 1 tablet (500 mg total) by mouth daily.   aspirin  81 MG chewable tablet Chew by mouth daily.   cholecalciferol (VITAMIN D3) 25 MCG (1000 UNIT) tablet Take 1,000 Units by mouth daily.   MELATONIN PO Take 1 tablet by mouth at bedtime as needed (sleep).   metoprolol  succinate (TOPROL -XL) 50 MG 24 hr tablet Take 50 mg by mouth daily. Take with or immediately following a meal.   nitroGLYCERIN  (NITROSTAT ) 0.4 MG SL tablet Place 1 tablet (0.4 mg total) under the tongue every 5 (five) minutes as needed for chest pain.   Probiotic Product (PROBIOTIC DAILY PO) Take 1 capsule by mouth daily. SUPREMA DOPHILUS   Psyllium Husk POWD Take 1 Scoop by mouth in the morning.   ramipril  (ALTACE ) 10 MG  capsule Take 1 capsule (10 mg total) by mouth daily. Please do NOT  Restart Ramipril  until 12/09/2019   ranolazine  (RANEXA ) 500 MG 12 hr tablet Take 1 tablet (500 mg total) by mouth 2 (two) times daily.   traZODone (DESYREL) 50 MG tablet Take 100 mg by mouth at bedtime as needed for sleep.   zinc  sulfate 220 (50 Zn) MG capsule Take 1 capsule (220 mg total) by mouth daily.   No facility-administered encounter medications on file as of 09/27/2023.    Social History: Social History   Tobacco Use   Smoking status: Former    Current packs/day: 0.00    Average packs/day: 2.0 packs/day for 18.0 years (36.0 ttl pk-yrs)    Types: Cigarettes    Start date: 02/07/1975    Quit date: 02/06/1993    Years since quitting: 30.6   Smokeless tobacco: Former   Tobacco comments:    Chew twice yearly  Vaping Use   Vaping status: Never Used  Substance Use Topics   Alcohol use: No    Alcohol/week: 0.0 standard drinks of alcohol   Drug use: No    Family Medical History: Family History  Problem Relation Age of Onset   CAD Father        CABG    CAD  Brother        CABG in 42s   CAD Brother    Arthritis/Rheumatoid Mother    Hyperlipidemia Mother    Thrombocytopenia Mother    COPD Son        smoker   Alzheimer's disease Maternal Grandmother    Lung disease Paternal Grandfather     Physical Examination: Vitals:   09/27/23 1115  BP: (!) 108/56      Awake, alert, oriented to person, place, and time.  Speech is clear and fluent. Fund of knowledge is appropriate.   Cranial Nerves: Pupils equal round and reactive to light.  Facial tone is symmetric.    No abnormal lesions on exposed skin.   Strength: Side Biceps Triceps Deltoid Interossei Grip Wrist Ext. Wrist Flex.  R 5 5 5 5 5 5 5   L 5 5 5 5 5 5 5    Side Iliopsoas Quads Hamstring PF DF EHL  R 5 5 5 5 5 5   L 5 5 5 5 5 5    Reflexes are 1+ and symmetric at the biceps, brachioradialis, patella and achilles.   Hoffman's is present on left, absent on right.  Clonus is not present.   Bilateral upper and lower extremity sensation is intact to light touch.     Gait is normal.     Medical Decision Making  Imaging: Cervical xrays dated 09/19/23:  FINDINGS: Anterior fusion C3 through C5 with interbody spacers. Hardware is intact with solid fusion. Straightening of normal lordosis without listhesis. C6-C7 disc space narrowing and spurring. Multilevel facet hypertrophy. No evidence of fracture. No prevertebral soft tissue thickening.   IMPRESSION: 1. Anterior fusion C3 through C5 with solid fusion. 2. C6-C7 degenerative disc disease. 3. Multilevel facet hypertrophy.   Electronically Signed: By: Andrea Gasman M.D. On: 09/19/2023 17:07  Cervical xrays addendum dated 09/25/23:  ADDENDUM: Addendum is created as additional flexion and extension views were obtained after initial report was submitted. This addendum is to address flexion and extension imaging.   There is no evidence of abnormal motion or instability on flexion or extension. The remainder the exam is  unchanged.     Electronically Signed   By: Andrea  Sanford M.D.   On: 09/25/2023 11:00   Cervical MRI dated 09/02/23:  FINDINGS: The craniocervical junction is normal   There is no significant bone marrow signal abnormality.   There is myelomalacia of the cord centered at the C3-C5 level   C2-C3: The disc is normal. There is ankylosis of the facet joint on the right. No spinal stenosis or foraminal stenosis   C3-C4: Prior ACDF with solid arthrodesis. There is ankylosis of the facet joint on the right. There is no spinal stenosis or significant foraminal stenosis   C4-C5: Prior ACDF with solid arthrodesis. Mild facet arthropathy. No spinal stenosis or foraminal stenosis   C5-C6: The disc height is preserved. There is slight desiccation of the disc. Mild facet arthropathy on the right. No spinal stenosis or foraminal stenosis   C6-C7: There is moderate degenerative disc disease with mild disc bulge/vertebral endplate osteophyte complex causing mild spinal stenosis. No significant facet disease. There is no significant foraminal stenosis   C7-T1: There is some mild disc bulge. There is moderate facet arthropathy. There is no significant spinal stenosis or foraminal stenosis   IMPRESSION: 1. Prior ACDF at C3-C5 with solid arthrodesis 2. Mild dilatation of the cord centered at the C3-4 and C4-5 levels 3. No spinal stenosis, disc herniation or other specific cause for a radiculopathy seen.     Electronically Signed   By: Nancyann Burns M.D.   On: 09/08/2023 09:24   I have personally reviewed the images and agree with the above interpretation.    Assessment and Plan: Mr. Harvel has a history of ACDF C3-C5 11/12/15 with Dr. Nudleman. Also with history of lumbar fusion.   History of left ulnar neuropathy and bilateral CTS. He had bilateral CTR with recent repeat left CTR 03/02/21 with Dr. Margrette.   He continues with constant neck pain with left > right arm pain to his  hands x years that is mostly tolerable. Since above neck surgery, he has chronic dexterity issues in left > right hand- this is unchanged. He has numbness, tingling, and weakness in both arms. No balance issues.   No instability seen on cervical xrays. He is fused C3-C5 and has chronic myelomalacia at C3-C4 and C4-C5 (was seen at C4-C5 on cervical MRI from 05/19/04). He has mild/moderate central stenosis C6-C7. I don't see any compressive lesions that would explain his arm symptoms.   Treatment options discussed with patient and following plan made:   - Consider EMG/NCS to further evaluate pain and  numbness/tingling in arms.  - Discussed PT for cervical spine and he declines.  - Consider referral to pain management for possible cervical injections.  - He will think about above options and let me know if he wants to proceed. He may just continue to live with it.  - He will f/u prn.   Blood pressure is slightly low. Was high at ortho visit this morning. No symptoms. His daughter will recheck at home later today. She is a Engineer, civil (consulting). He will call PCP with any issues.   I spent a total of 20 minutes in face-to-face and non-face-to-face activities related to this patient's care today including review of outside records, review of imaging, review of symptoms, physical exam, discussion of differential diagnosis, discussion of treatment options, and documentation.   Glade Boys PA-C Dept. of Neurosurgery

## 2023-09-27 ENCOUNTER — Encounter: Payer: Self-pay | Admitting: Orthopedic Surgery

## 2023-09-27 ENCOUNTER — Ambulatory Visit (INDEPENDENT_AMBULATORY_CARE_PROVIDER_SITE_OTHER): Admitting: Orthopedic Surgery

## 2023-09-27 VITALS — BP 108/56 | Ht 68.0 in | Wt 214.2 lb

## 2023-09-27 DIAGNOSIS — M4802 Spinal stenosis, cervical region: Secondary | ICD-10-CM

## 2023-09-27 DIAGNOSIS — R29898 Other symptoms and signs involving the musculoskeletal system: Secondary | ICD-10-CM

## 2023-09-27 DIAGNOSIS — Z981 Arthrodesis status: Secondary | ICD-10-CM | POA: Diagnosis not present

## 2023-09-27 DIAGNOSIS — M47812 Spondylosis without myelopathy or radiculopathy, cervical region: Secondary | ICD-10-CM

## 2023-09-27 DIAGNOSIS — M65322 Trigger finger, left index finger: Secondary | ICD-10-CM | POA: Diagnosis not present

## 2023-09-27 DIAGNOSIS — M65342 Trigger finger, left ring finger: Secondary | ICD-10-CM | POA: Diagnosis not present

## 2023-09-27 DIAGNOSIS — M65332 Trigger finger, left middle finger: Secondary | ICD-10-CM | POA: Diagnosis not present

## 2023-09-27 DIAGNOSIS — M5412 Radiculopathy, cervical region: Secondary | ICD-10-CM

## 2023-09-27 NOTE — Patient Instructions (Signed)
 It was so nice to see you today. Thank you so much for coming in.    You are fused from C3-C5- this looks good. You have some wear and tear in your neck with mild spinal stenosis C6-C7. Also with chronic changes in spinal cord at C3-C4 and C4-C5, this is likely from prior to your surgery.   Neck pain may be from wear and tear. I don't see anything on MRI that would explain your arm pain, numbness, or tingling.   Here are your options:   Get nerve test/EMG to further evaluate the pain, numbness, and tingling in your arms. This would be done by neurology.  Physical therapy for your neck to help with neck and arm pain.  Referral to pain management to consider injections in your neck.   Let me know if you want to do anything of these things. I am happy to see you back to discuss further as well.   Please do not hesitate to call if you have any questions or concerns. You can also message me in MyChart.   I'm sorry you had to wait for me to see you today, thank you for being patient with me!  Glade Boys PA-C [phone]     The physicians and staff at [facility] at [facility] are committed to providing excellent care. You may receive a survey asking for feedback about your experience at our office. We value you your feedback and appreciate you taking the time to to fill it out. The [facility] leadership team is also available to discuss your experience in person, feel free to contact us  [phone].

## 2023-10-02 ENCOUNTER — Telehealth: Payer: Self-pay | Admitting: Orthopedic Surgery

## 2023-10-02 NOTE — Telephone Encounter (Signed)
 Patient is calling to follow up on the referral for an EMG or if Glade still wanted him to have this done. Please advise.

## 2023-10-02 NOTE — Telephone Encounter (Signed)
 Left detailed message letting patient know that per LOV 09/27/2023 Stacy discussed options with patient and EMG referral was one of the options and patient was to think about this information and let us  know if he wanted to proceed.  I left a message and asked patient to let us  know if he wanted to proceed with the referral for EMG at this time.

## 2023-10-16 DIAGNOSIS — M65322 Trigger finger, left index finger: Secondary | ICD-10-CM | POA: Diagnosis not present

## 2023-12-22 ENCOUNTER — Encounter: Payer: Self-pay | Admitting: Gastroenterology

## 2024-01-30 ENCOUNTER — Encounter: Payer: Self-pay | Admitting: Gastroenterology

## 2024-01-30 ENCOUNTER — Ambulatory Visit (INDEPENDENT_AMBULATORY_CARE_PROVIDER_SITE_OTHER): Admitting: Gastroenterology

## 2024-01-30 ENCOUNTER — Other Ambulatory Visit: Payer: Self-pay | Admitting: *Deleted

## 2024-01-30 ENCOUNTER — Encounter: Payer: Self-pay | Admitting: *Deleted

## 2024-01-30 VITALS — BP 129/67 | HR 67 | Temp 97.7°F | Ht 68.0 in | Wt 226.0 lb

## 2024-01-30 DIAGNOSIS — K50913 Crohn's disease, unspecified, with fistula: Secondary | ICD-10-CM

## 2024-01-30 DIAGNOSIS — R1310 Dysphagia, unspecified: Secondary | ICD-10-CM

## 2024-01-30 MED ORDER — PEG 3350-KCL-NA BICARB-NACL 420 G PO SOLR: 4000.0000 mL | ORAL | 0 refills | Status: AC

## 2024-01-30 NOTE — Patient Instructions (Signed)
 Colonoscopy and upper endoscopy to be scheduled.   Please call if any change in medical status or questions/concerns.

## 2024-01-30 NOTE — Progress Notes (Signed)
 "    GI Office Note    Referring Provider: Trudy Vaughn FALCON, MD Primary Care Physician:  Trudy Vaughn FALCON, MD  Primary Gastroenterologist: Ozell Hollingshead, MD   Chief Complaint   Chief Complaint  Patient presents with   Dysphagia    Having issues with pills getting hung up in his throat    History of Present Illness   [name] is a 73 y.o. male presenting today at the request of Dr. Trudy for follow-up of Crohn's disease.  Patient last seen in our office in 2018.  Patient has a history of anal fistulotomy by Dr. Mavis in May 2015.   Diagnosed with Crohn's in September 2016 at time of colonoscopy which found skipped colonic ulcerations with significant ileocecal valve involvement and rectal sparing most consistent with Crohn's.  Biopsies returned with ulceration but somewhat nonspecific could be related to NSAID use or Crohn's. Very limited NSAID use at that time in way of ASA. CT enterography without significant enteritis.  He has a history of Crohn's with fistulizing disease, previously declined biologic/immunosuppressive therapy.  Previously on off label Apriso . He was lost to follow up. Patient previously seen by general surgeon Dr. Sheldon in 2018. Patient has underwent several procedures with less than ideal results for his  disease and advised pharmacologic therapy directed at fistulizing disease should be consider before any additional surgery, but patient has not been interested over the years.    Discussed the use of AI scribe software for clinical note transcription with the patient, who gave verbal consent to proceed.  History of Present Illness [name] is a 73 year old male with Crohn's disease and perianal fistula who presents for evaluation of ongoing intermittent perianal drainage. He presents with his daughter, Rojelio Shines, who is RN in [place].  Surgical intervention for a fistula performed in 2015 with Dr. Mavis, but the area has not fully resolved. He  possibly had additional surgery with Dr. Sheldon (records not available). He previously used mesalamine  off label, as he was resistant to biologic therapy for Crohn's. Unfortunately he was lost to follow up. He has been off mesalamine  for years.    He has intermittent soreness and discomfort near the anal opening, worse with straining. The area occasionally drains bright red blood with pus a couple of times per year, with persistent soreness between episodes. He denies persistent bleeding, bleeding with bowel movements. Bowel movements are daily, sometimes more frequent with coffee. He uses psyllium husk to maintain regularity and reduce straining but avoids it when leaving the house due to urgency. He denies blood thinners, aspirin , ibuprofen , and Aleve . He uses Tylenol  as needed.  He has new difficulty swallowing pills with a sensation of pills sticking in the throat. Once, a probiotic capsule was regurgitated intact about 30 minutes after ingestion. He has no difficulty swallowing solids or liquids and no coughing, choking, or aspiration. He had an upper endoscopy and swallowing studies in the past, without esophageal dilation. He denies current heartburn or indigestion although he had chronic GERD in the past.  He has intermittent sharp, stabbing pain across the lower abdomen that resolves quickly.   Prior Data     Results    MR pelvis with and without contrast 12/2015 IMPRESSION: 1. Examination is positive for two left sided grade 1 intersphincteric fistulas within the anterior lateral and posterior lateral positions. 2. No abscess identified.  CTE A/P with contrast 11/2024 IMPRESSION: 1. No evidence of active enteritis. There is nonspecific  submucosal fat prominence within the terminal ileum. This has been described in patients with chronic inflammatory bowel disease but is also a normal variant. This is similar back on 03/22/2011. 2. Ascending colonic lipoma, without complicating  obstruction. 3.  Atherosclerosis, including within the coronary arteries. 4. Left nephrolithiasis.  Colonoscopy 10/2014: Skipped colonoic ulcerations with significant ileocecal valve involvement and rectal sparing most c/w crohn's. Probably colonic lipoma. Minimal nsaid use.  -ICV bx, ascending colon, and sigmoid biopsies with ulcerated colonic muclosa, nonspecific could be due to nsaids or crohn's. No granulomas.    Medications   Current Outpatient Medications  Medication Sig Dispense Refill   acetaminophen  (TYLENOL ) 650 MG CR tablet Take 1,300 mg by mouth every 8 (eight) hours as needed for pain.     amLODipine  (NORVASC ) 5 MG tablet Take 1 tablet (5 mg total) by mouth daily. 180 tablet 3   Artificial Tear Solution (SOOTHE XP) SOLN Place 1 drop into both eyes 2 (two) times daily as needed (dry eyes).     ascorbic acid  (VITAMIN C) 500 MG tablet Take 1 tablet (500 mg total) by mouth daily. 30 tablet 2   cholecalciferol (VITAMIN D3) 25 MCG (1000 UNIT) tablet Take 1,000 Units by mouth daily.     clotrimazole-betamethasone  (LOTRISONE) cream Apply 1 Application topically 2 (two) times daily as needed.     MELATONIN PO Take 1 tablet by mouth at bedtime as needed (sleep).     metoprolol  succinate (TOPROL -XL) 50 MG 24 hr tablet Take 50 mg by mouth daily. Take with or immediately following a meal.     nitroGLYCERIN  (NITROSTAT ) 0.4 MG SL tablet Place 1 tablet (0.4 mg total) under the tongue every 5 (five) minutes as needed for chest pain. 25 tablet 3   Probiotic Product (PROBIOTIC DAILY PO) Take 1 capsule by mouth daily. SUPREMA DOPHILUS     Psyllium Husk POWD Take 1 Scoop by mouth in the morning.     ramipril  (ALTACE ) 10 MG capsule Take 1 capsule (10 mg total) by mouth daily. Please do NOT  Restart Ramipril  until 12/09/2019 30 capsule 0   traZODone (DESYREL) 50 MG tablet Take 100 mg by mouth at bedtime as needed for sleep.     zinc  sulfate 220 (50 Zn) MG capsule Take 1 capsule (220 mg total) by mouth  daily. 30 capsule 0   No current facility-administered medications for this visit.    Allergies   Allergies as of 01/30/2024 - Review Complete 01/30/2024  Allergen Reaction Noted   Niacin Other (See Comments) 05/22/2012   Penicillins Hives, Itching, and Other (See Comments)    Statins Other (See Comments) 03/28/2013     Past Medical History   Past Medical History:  Diagnosis Date   Anxiety    Articular cartilage disorder involving shoulder region, left 11/2016   Bursitis of shoulder, left 11/2016   Complication of anesthesia    hx. of heart rate dropping during 2 surgeries   (2004- back surgery and with Lithotrispy )   Crohn's disease (HCC)    Depression    Dyspnea    with exertion   History of hepatitis    unknown type - 1970s   History of kidney stones    Hyperlipidemia    unable to tolerat statins   Hypertension    states under control with meds., has been on med. since 1994   Immature cataract of both eyes    Limited joint range of motion    cervical spine   Myocardial  infarct (HCC) 09/13/1992   Nonobstructive atherosclerosis of coronary artery    Numbness in both hands    Obesity    Osteoarthritis 11/2016   left shoulder   Rotator cuff tear, left 11/2016   Sleep apnea    uses BiPAP, but having issues with mask not sealing    Thyroid  nodule     Past Surgical History   Past Surgical History:  Procedure Laterality Date   ANAL FISTULOTOMY N/A 06/28/2013   Procedure: ANAL FISTULOTOMY;  Surgeon: Oneil DELENA Budge, MD;  Location: [facility];  Service: General;  Laterality: N/A;   ANAL FISTULOTOMY  2017   ANTERIOR CERVICAL DECOMP/DISCECTOMY FUSION N/A 11/12/2015   Procedure: ANTERIOR CERVICAL DECOMPRESSION/DISCECTOMY FUSION CERVICAL THREE- CERVICAL FOUR, CERVICAL FOUR- CERVICAL FIVE;  Surgeon: Lamar Peaches, MD;  Location: [facility];  Service: Neurosurgery;  Laterality: N/A;  ANTERIOR CERVICAL DECOMPRESSION/DISCECTOMY FUSION C3-C4, C4-C5   BIOPSY THYROID      CARDIAC  CATHETERIZATION  1994, 12/25/2007   a. PTCA alone in 1994 b. RCA CTO w/ L-R collaterals, 40-50% prox RCA, 20% prox LAD; EF 50-55%, inferoapical HK   CARDIAC CATHETERIZATION  04/02/2001   CARDIAC CATHETERIZATION  11/07/2000   CARDIAC CATHETERIZATION  09/24/1996   CARPAL TUNNEL RELEASE Left 10/09/2014   Procedure: LEFT CARPAL TUNNEL RELEASE;  Surgeon: Franky Curia, MD;  Location: [facility];  Service: Orthopedics;  Laterality: Left;   CARPAL TUNNEL RELEASE Right 08/28/2003   CARPAL TUNNEL RELEASE Left 03/02/2021   Procedure: CARPAL TUNNEL RELEASE;  Surgeon: Margrette Taft BRAVO, MD;  Location: [facility];  Service: Orthopedics;  Laterality: Left;   CAUTERY OF TURBINATES  11/05/2003   COLONOSCOPY N/A 11/06/2014   RMR: Skipped Colonic ulcerations with significant ileocecal valve involvement and rectal sparing most consisitant with Crohns disease. Minimal non-steroidal drug use. Probable colonic lipoma   CYSTECTOMY     INCISION AND DRAINAGE ABSCESS Right 05/23/2012   Procedure: INCISION AND DRAINAGE ABSCESS;  Surgeon: Oneil DELENA Budge, MD;  Location: [facility];  Service: General;  Laterality: Right;   KNEE ARTHROSCOPY     LITHOTRIPSY     LUMBAR FUSION  02/18/2002   LUMBAR LAMINECTOMY  02/18/2002   L4-5   NASAL SEPTUM SURGERY  11/05/2003   PARTIAL NEPHRECTOMY Left    age 3   SHOULDER ARTHROSCOPY WITH SUBACROMIAL DECOMPRESSION, ROTATOR CUFF REPAIR AND BICEP TENDON REPAIR Left 11/30/2016   Procedure: LEFT SHOULDER ARTHROSCOPY, DEBRIDEMENT, ACROMIOPLASTY WITH DISTAL CLAVICAL EXCISION, POSSIBLE ROTATOR CUFF REPAIR AND BICEP TENODESIS;  Surgeon: Beverley Toribio FALCON, MD;  Location: [facility];  Service: Orthopedics;  Laterality: Left;   SPLENECTOMY     age 58   TRANSTHORACIC ECHOCARDIOGRAM  03/05/2009   EF >55%, normal LV wall thickness.    Past Family History   Family History  Problem Relation Age of Onset   CAD Father        CABG    CAD Brother        CABG in 22s   CAD Brother     Arthritis/Rheumatoid Mother    Hyperlipidemia Mother    Thrombocytopenia Mother    COPD Son        smoker   Alzheimer's disease Maternal Grandmother    Lung disease Paternal Grandfather     Past Social History   Social History   Socioeconomic History   Marital status: Widowed    Spouse name: Not on file   Number of children: Not on file   Years of education: Not on file  Highest education level: Not on file  Occupational History   Not on file  Tobacco Use   Smoking status: Former    Current packs/day: 0.00    Average packs/day: 2.0 packs/day for 18.0 years (36.0 ttl pk-yrs)    Types: Cigarettes    Start date: 02/07/1975    Quit date: 02/06/1993    Years since quitting: 31.0   Smokeless tobacco: Former   Tobacco comments:    Chew twice yearly  Vaping Use   Vaping status: Never Used  Substance and Sexual Activity   Alcohol use: No    Alcohol/week: 0.0 standard drinks of alcohol   Drug use: No   Sexual activity: Not Currently  Other Topics Concern   Not on file  Social History Narrative   Lives in [place], [place]. Married, 2 children.    Social Drivers of Health   Tobacco Use: Medium Risk (01/30/2024)   Patient History    Smoking Tobacco Use: Former    Smokeless Tobacco Use: Former    Passive Exposure: Not on Actuary Strain: Low Risk  (10/30/2023)   Received from [facility]   Overall Financial Resource Strain (CARDIA)    Difficulty of Paying Living Expenses: Not hard at all  Food Insecurity: No Food Insecurity (10/30/2023)   Received from [facility]   Epic    Within the past 12 months, you worried that your food would run out before you got the money to buy more.: Never true    Within the past 12 months, the food you bought just didn't last and you didn't have money to get more.: Never true  Transportation Needs: No Transportation Needs (10/30/2023)   Received from [facility] -  Transportation    In the past 12 months, has lack of transportation kept you from medical appointments or from getting medications?: No    Lack of Transportation (Non-Medical): No  Physical Activity: Not on file  Stress: Not on file  Social Connections: Not on file  Intimate Partner Violence: Not on file  Depression (EYV7-0): Not on file  Alcohol Screen: Not on file  Housing: Low Risk  (10/30/2023)   Received from [facility]   Epic    In the last 12 months, was there a time when you were not able to pay the mortgage or rent on time?: No    In the past 12 months, how many times have you moved where you were living?: 0    At any time in the past 12 months, were you homeless or living in a shelter (including now)?: No  Utilities: Not At Risk (10/30/2023)   Received from [facility]   Epic    In the past 12 months has the electric, gas, oil, or water  company threatened to shut off services in your home?: No  Health Literacy: Not on file    Review of Systems   General: Negative for anorexia, weight loss, fever, chills, fatigue, weakness. ENT: Negative for hoarseness, difficulty swallowing , nasal congestion. CV: Negative for chest pain, angina, palpitations, dyspnea on exertion, peripheral edema.  Respiratory: Negative for dyspnea at rest, dyspnea on exertion, cough, sputum, wheezing.  GI: See history of present illness. GU:  Negative for dysuria, hematuria, urinary incontinence, urinary frequency, nocturnal urination.  Endo: Negative for unusual weight change.     Physical Exam   BP 129/67   Pulse 67   Temp 97.7  F (36.5 C) (Oral)   Ht 5' 8 (1.727 m)   Wt 226 lb (102.5 kg)   SpO2 94%   BMI 34.36 kg/m    General: Well-nourished, well-developed in no acute distress. Accompanied by daughter, Rojelio. Eyes: No icterus. Mouth: Oropharyngeal mucosa moist and pink   Lungs: Clear to auscultation bilaterally.  Heart: Regular rate and rhythm, no  murmurs rubs or gallops.  Abdomen: Bowel sounds are normal, nontender, nondistended, no hepatosplenomegaly or masses,  no abdominal bruits or hernia , no rebound or guarding.  Rectal: not performed, deferred to time of colonoscopy Extremities: No lower extremity edema. No clubbing or deformities. Neuro: Alert and oriented x 4   Skin: Warm and dry, no jaundice.   Psych: Alert and cooperative, normal mood and affect.  Labs   Lab Results  Component Value Date   NA 136 06/24/2023   CL 104 06/24/2023   K 3.5 06/24/2023   CO2 25 06/24/2023   BUN 13 06/24/2023   CREATININE 0.93 06/24/2023   GFRNONAA >60 06/24/2023   CALCIUM  8.6 (L) 06/24/2023   PHOS 2.6 11/13/2019   ALBUMIN 3.3 (L) 11/13/2019   GLUCOSE 113 (H) 06/24/2023   Lab Results  Component Value Date   WBC 6.9 06/24/2023   HGB 15.7 06/24/2023   HCT 45.3 06/24/2023   MCV 90.8 06/24/2023   PLT 365 06/24/2023    Imaging Studies   No results found.  Assessment/Plan:    Assessment & Plan Crohn's disease with history of perianal fistula Chronic Crohn's disease with intermittent perianal fistula symptoms, no severe complications or active bleeding, not on pharmacotherapy. - Ordered colonoscopy to assess inflammation and evaluate perianal area and fistula. ASA 3.Room 1,2 ok.  I have discussed the risks, alternatives, benefits with regards to but not limited to the risk of reaction to medication, bleeding, infection, perforation and the patient is agreeable to proceed. Written consent to be obtained.   Pill dysphagia Chronic pill dysphagia without liquid involvement, odynophagia, or aspiration. Etiology could be esophageal or oropharyngeal related. - Ordered upper endoscopy with esophageal dilation. ASA 3.  I have discussed the risks, alternatives, benefits with regards to but not limited to the risk of reaction to medication, bleeding, infection, perforation and the patient is agreeable to proceed. Written consent to be  obtained.     [name]. Ezzard, MHS, PA-C [facility]  "

## 2024-01-30 NOTE — H&P (View-Only) (Signed)
 "    GI Office Note    Referring Provider: Trudy Vaughn FALCON, MD Primary Care Physician:  Trudy Vaughn FALCON, MD  Primary Gastroenterologist: Ozell Hollingshead, MD   Chief Complaint   Chief Complaint  Patient presents with   Dysphagia    Having issues with pills getting hung up in his throat    History of Present Illness   [name] is a 73 y.o. male presenting today at the request of Dr. Trudy for follow-up of Crohn's disease.  Patient last seen in our office in 2018.  Patient has a history of anal fistulotomy by Dr. Mavis in May 2015.   Diagnosed with Crohn's in September 2016 at time of colonoscopy which found skipped colonic ulcerations with significant ileocecal valve involvement and rectal sparing most consistent with Crohn's.  Biopsies returned with ulceration but somewhat nonspecific could be related to NSAID use or Crohn's. Very limited NSAID use at that time in way of ASA. CT enterography without significant enteritis.  He has a history of Crohn's with fistulizing disease, previously declined biologic/immunosuppressive therapy.  Previously on off label Apriso . He was lost to follow up. Patient previously seen by general surgeon Dr. Sheldon in 2018. Patient has underwent several procedures with less than ideal results for his  disease and advised pharmacologic therapy directed at fistulizing disease should be consider before any additional surgery, but patient has not been interested over the years.    Discussed the use of AI scribe software for clinical note transcription with the patient, who gave verbal consent to proceed.  History of Present Illness [name] is a 73 year old male with Crohn's disease and perianal fistula who presents for evaluation of ongoing intermittent perianal drainage. He presents with his daughter, Rojelio Shines, who is RN in [place].  Surgical intervention for a fistula performed in 2015 with Dr. Mavis, but the area has not fully resolved. He  possibly had additional surgery with Dr. Sheldon (records not available). He previously used mesalamine  off label, as he was resistant to biologic therapy for Crohn's. Unfortunately he was lost to follow up. He has been off mesalamine  for years.    He has intermittent soreness and discomfort near the anal opening, worse with straining. The area occasionally drains bright red blood with pus a couple of times per year, with persistent soreness between episodes. He denies persistent bleeding, bleeding with bowel movements. Bowel movements are daily, sometimes more frequent with coffee. He uses psyllium husk to maintain regularity and reduce straining but avoids it when leaving the house due to urgency. He denies blood thinners, aspirin , ibuprofen , and Aleve . He uses Tylenol  as needed.  He has new difficulty swallowing pills with a sensation of pills sticking in the throat. Once, a probiotic capsule was regurgitated intact about 30 minutes after ingestion. He has no difficulty swallowing solids or liquids and no coughing, choking, or aspiration. He had an upper endoscopy and swallowing studies in the past, without esophageal dilation. He denies current heartburn or indigestion although he had chronic GERD in the past.  He has intermittent sharp, stabbing pain across the lower abdomen that resolves quickly.   Prior Data     Results    MR pelvis with and without contrast 12/2015 IMPRESSION: 1. Examination is positive for two left sided grade 1 intersphincteric fistulas within the anterior lateral and posterior lateral positions. 2. No abscess identified.  CTE A/P with contrast 11/2024 IMPRESSION: 1. No evidence of active enteritis. There is nonspecific  submucosal fat prominence within the terminal ileum. This has been described in patients with chronic inflammatory bowel disease but is also a normal variant. This is similar back on 03/22/2011. 2. Ascending colonic lipoma, without complicating  obstruction. 3.  Atherosclerosis, including within the coronary arteries. 4. Left nephrolithiasis.  Colonoscopy 10/2014: Skipped colonoic ulcerations with significant ileocecal valve involvement and rectal sparing most c/w crohn's. Probably colonic lipoma. Minimal nsaid use.  -ICV bx, ascending colon, and sigmoid biopsies with ulcerated colonic muclosa, nonspecific could be due to nsaids or crohn's. No granulomas.    Medications   Current Outpatient Medications  Medication Sig Dispense Refill   acetaminophen  (TYLENOL ) 650 MG CR tablet Take 1,300 mg by mouth every 8 (eight) hours as needed for pain.     amLODipine  (NORVASC ) 5 MG tablet Take 1 tablet (5 mg total) by mouth daily. 180 tablet 3   Artificial Tear Solution (SOOTHE XP) SOLN Place 1 drop into both eyes 2 (two) times daily as needed (dry eyes).     ascorbic acid  (VITAMIN C) 500 MG tablet Take 1 tablet (500 mg total) by mouth daily. 30 tablet 2   cholecalciferol (VITAMIN D3) 25 MCG (1000 UNIT) tablet Take 1,000 Units by mouth daily.     clotrimazole-betamethasone  (LOTRISONE) cream Apply 1 Application topically 2 (two) times daily as needed.     MELATONIN PO Take 1 tablet by mouth at bedtime as needed (sleep).     metoprolol  succinate (TOPROL -XL) 50 MG 24 hr tablet Take 50 mg by mouth daily. Take with or immediately following a meal.     nitroGLYCERIN  (NITROSTAT ) 0.4 MG SL tablet Place 1 tablet (0.4 mg total) under the tongue every 5 (five) minutes as needed for chest pain. 25 tablet 3   Probiotic Product (PROBIOTIC DAILY PO) Take 1 capsule by mouth daily. SUPREMA DOPHILUS     Psyllium Husk POWD Take 1 Scoop by mouth in the morning.     ramipril  (ALTACE ) 10 MG capsule Take 1 capsule (10 mg total) by mouth daily. Please do NOT  Restart Ramipril  until 12/09/2019 30 capsule 0   traZODone (DESYREL) 50 MG tablet Take 100 mg by mouth at bedtime as needed for sleep.     zinc  sulfate 220 (50 Zn) MG capsule Take 1 capsule (220 mg total) by mouth  daily. 30 capsule 0   No current facility-administered medications for this visit.    Allergies   Allergies as of 01/30/2024 - Review Complete 01/30/2024  Allergen Reaction Noted   Niacin Other (See Comments) 05/22/2012   Penicillins Hives, Itching, and Other (See Comments)    Statins Other (See Comments) 03/28/2013     Past Medical History   Past Medical History:  Diagnosis Date   Anxiety    Articular cartilage disorder involving shoulder region, left 11/2016   Bursitis of shoulder, left 11/2016   Complication of anesthesia    hx. of heart rate dropping during 2 surgeries   (2004- back surgery and with Lithotrispy )   Crohn's disease (HCC)    Depression    Dyspnea    with exertion   History of hepatitis    unknown type - 1970s   History of kidney stones    Hyperlipidemia    unable to tolerat statins   Hypertension    states under control with meds., has been on med. since 1994   Immature cataract of both eyes    Limited joint range of motion    cervical spine   Myocardial  infarct (HCC) 09/13/1992   Nonobstructive atherosclerosis of coronary artery    Numbness in both hands    Obesity    Osteoarthritis 11/2016   left shoulder   Rotator cuff tear, left 11/2016   Sleep apnea    uses BiPAP, but having issues with mask not sealing    Thyroid  nodule     Past Surgical History   Past Surgical History:  Procedure Laterality Date   ANAL FISTULOTOMY N/A 06/28/2013   Procedure: ANAL FISTULOTOMY;  Surgeon: Oneil DELENA Budge, MD;  Location: [facility];  Service: General;  Laterality: N/A;   ANAL FISTULOTOMY  2017   ANTERIOR CERVICAL DECOMP/DISCECTOMY FUSION N/A 11/12/2015   Procedure: ANTERIOR CERVICAL DECOMPRESSION/DISCECTOMY FUSION CERVICAL THREE- CERVICAL FOUR, CERVICAL FOUR- CERVICAL FIVE;  Surgeon: Lamar Peaches, MD;  Location: [facility];  Service: Neurosurgery;  Laterality: N/A;  ANTERIOR CERVICAL DECOMPRESSION/DISCECTOMY FUSION C3-C4, C4-C5   BIOPSY THYROID      CARDIAC  CATHETERIZATION  1994, 12/25/2007   a. PTCA alone in 1994 b. RCA CTO w/ L-R collaterals, 40-50% prox RCA, 20% prox LAD; EF 50-55%, inferoapical HK   CARDIAC CATHETERIZATION  04/02/2001   CARDIAC CATHETERIZATION  11/07/2000   CARDIAC CATHETERIZATION  09/24/1996   CARPAL TUNNEL RELEASE Left 10/09/2014   Procedure: LEFT CARPAL TUNNEL RELEASE;  Surgeon: Franky Curia, MD;  Location: [facility];  Service: Orthopedics;  Laterality: Left;   CARPAL TUNNEL RELEASE Right 08/28/2003   CARPAL TUNNEL RELEASE Left 03/02/2021   Procedure: CARPAL TUNNEL RELEASE;  Surgeon: Margrette Taft BRAVO, MD;  Location: [facility];  Service: Orthopedics;  Laterality: Left;   CAUTERY OF TURBINATES  11/05/2003   COLONOSCOPY N/A 11/06/2014   RMR: Skipped Colonic ulcerations with significant ileocecal valve involvement and rectal sparing most consisitant with Crohns disease. Minimal non-steroidal drug use. Probable colonic lipoma   CYSTECTOMY     INCISION AND DRAINAGE ABSCESS Right 05/23/2012   Procedure: INCISION AND DRAINAGE ABSCESS;  Surgeon: Oneil DELENA Budge, MD;  Location: [facility];  Service: General;  Laterality: Right;   KNEE ARTHROSCOPY     LITHOTRIPSY     LUMBAR FUSION  02/18/2002   LUMBAR LAMINECTOMY  02/18/2002   L4-5   NASAL SEPTUM SURGERY  11/05/2003   PARTIAL NEPHRECTOMY Left    age 3   SHOULDER ARTHROSCOPY WITH SUBACROMIAL DECOMPRESSION, ROTATOR CUFF REPAIR AND BICEP TENDON REPAIR Left 11/30/2016   Procedure: LEFT SHOULDER ARTHROSCOPY, DEBRIDEMENT, ACROMIOPLASTY WITH DISTAL CLAVICAL EXCISION, POSSIBLE ROTATOR CUFF REPAIR AND BICEP TENODESIS;  Surgeon: Beverley Toribio FALCON, MD;  Location: [facility];  Service: Orthopedics;  Laterality: Left;   SPLENECTOMY     age 58   TRANSTHORACIC ECHOCARDIOGRAM  03/05/2009   EF >55%, normal LV wall thickness.    Past Family History   Family History  Problem Relation Age of Onset   CAD Father        CABG    CAD Brother        CABG in 22s   CAD Brother     Arthritis/Rheumatoid Mother    Hyperlipidemia Mother    Thrombocytopenia Mother    COPD Son        smoker   Alzheimer's disease Maternal Grandmother    Lung disease Paternal Grandfather     Past Social History   Social History   Socioeconomic History   Marital status: Widowed    Spouse name: Not on file   Number of children: Not on file   Years of education: Not on file  Highest education level: Not on file  Occupational History   Not on file  Tobacco Use   Smoking status: Former    Current packs/day: 0.00    Average packs/day: 2.0 packs/day for 18.0 years (36.0 ttl pk-yrs)    Types: Cigarettes    Start date: 02/07/1975    Quit date: 02/06/1993    Years since quitting: 31.0   Smokeless tobacco: Former   Tobacco comments:    Chew twice yearly  Vaping Use   Vaping status: Never Used  Substance and Sexual Activity   Alcohol use: No    Alcohol/week: 0.0 standard drinks of alcohol   Drug use: No   Sexual activity: Not Currently  Other Topics Concern   Not on file  Social History Narrative   Lives in [place], [place]. Married, 2 children.    Social Drivers of Health   Tobacco Use: Medium Risk (01/30/2024)   Patient History    Smoking Tobacco Use: Former    Smokeless Tobacco Use: Former    Passive Exposure: Not on Actuary Strain: Low Risk  (10/30/2023)   Received from [facility]   Overall Financial Resource Strain (CARDIA)    Difficulty of Paying Living Expenses: Not hard at all  Food Insecurity: No Food Insecurity (10/30/2023)   Received [facility]   Epic    Within the past 12 months, you worried that your food would run out before you got the money to buy more.: Never true    Within the past 12 months, the food you bought just didn't last and you didn't have money to get more.: Never true  Transportation Needs: No Transportation Needs (10/30/2023)   Received from [facility] -  Transportation    In the past 12 months, has lack of transportation kept you from medical appointments or from getting medications?: No    Lack of Transportation (Non-Medical): No  Physical Activity: Not on file  Stress: Not on file  Social Connections: Not on file  Intimate Partner Violence: Not on file  Depression (EYV7-0): Not on file  Alcohol Screen: Not on file  Housing: Low Risk  (10/30/2023)   Received from [facility]   Epic    In the last 12 months, was there a time when you were not able to pay the mortgage or rent on time?: No    In the past 12 months, how many times have you moved where you were living?: 0    At any time in the past 12 months, were you homeless or living in a shelter (including now)?: No  Utilities: Not At Risk (10/30/2023)   Received from [facility]   Epic    In the past 12 months has the electric, gas, oil, or water  company threatened to shut off services in your home?: No  Health Literacy: Not on file    Review of Systems   General: Negative for anorexia, weight loss, fever, chills, fatigue, weakness. ENT: Negative for hoarseness, difficulty swallowing , nasal congestion. CV: Negative for chest pain, angina, palpitations, dyspnea on exertion, peripheral edema.  Respiratory: Negative for dyspnea at rest, dyspnea on exertion, cough, sputum, wheezing.  GI: See history of present illness. GU:  Negative for dysuria, hematuria, urinary incontinence, urinary frequency, nocturnal urination.  Endo: Negative for unusual weight change.     Physical Exam   BP 129/67   Pulse 67   Temp 97.7  F (36.5 C) (Oral)   Ht 5' 8 (1.727 m)   Wt 226 lb (102.5 kg)   SpO2 94%   BMI 34.36 kg/m    General: Well-nourished, well-developed in no acute distress. Accompanied by daughter, Rojelio. Eyes: No icterus. Mouth: Oropharyngeal mucosa moist and pink   Lungs: Clear to auscultation bilaterally.  Heart: Regular rate and rhythm, no  murmurs rubs or gallops.  Abdomen: Bowel sounds are normal, nontender, nondistended, no hepatosplenomegaly or masses,  no abdominal bruits or hernia , no rebound or guarding.  Rectal: not performed, deferred to time of colonoscopy Extremities: No lower extremity edema. No clubbing or deformities. Neuro: Alert and oriented x 4   Skin: Warm and dry, no jaundice.   Psych: Alert and cooperative, normal mood and affect.  Labs   Lab Results  Component Value Date   NA 136 06/24/2023   CL 104 06/24/2023   K 3.5 06/24/2023   CO2 25 06/24/2023   BUN 13 06/24/2023   CREATININE 0.93 06/24/2023   GFRNONAA >60 06/24/2023   CALCIUM  8.6 (L) 06/24/2023   PHOS 2.6 11/13/2019   ALBUMIN 3.3 (L) 11/13/2019   GLUCOSE 113 (H) 06/24/2023   Lab Results  Component Value Date   WBC 6.9 06/24/2023   HGB 15.7 06/24/2023   HCT 45.3 06/24/2023   MCV 90.8 06/24/2023   PLT 365 06/24/2023    Imaging Studies   No results found.  Assessment/Plan:    Assessment & Plan Crohn's disease with history of perianal fistula Chronic Crohn's disease with intermittent perianal fistula symptoms, no severe complications or active bleeding, not on pharmacotherapy. - Ordered colonoscopy to assess inflammation and evaluate perianal area and fistula. ASA 3.Room 1,2 ok.  I have discussed the risks, alternatives, benefits with regards to but not limited to the risk of reaction to medication, bleeding, infection, perforation and the patient is agreeable to proceed. Written consent to be obtained.   Pill dysphagia Chronic pill dysphagia without liquid involvement, odynophagia, or aspiration. Etiology could be esophageal or oropharyngeal related. - Ordered upper endoscopy with esophageal dilation. ASA 3.  I have discussed the risks, alternatives, benefits with regards to but not limited to the risk of reaction to medication, bleeding, infection, perforation and the patient is agreeable to proceed. Written consent to be  obtained.     Sonny RAMAN. Ezzard, MHS, PA-C [facility]  "

## 2024-02-15 ENCOUNTER — Ambulatory Visit: Attending: Internal Medicine | Admitting: Internal Medicine

## 2024-02-15 ENCOUNTER — Encounter: Payer: Self-pay | Admitting: Internal Medicine

## 2024-02-15 VITALS — BP 110/70 | HR 68 | Ht 68.0 in | Wt 223.6 lb

## 2024-02-15 DIAGNOSIS — R0602 Shortness of breath: Secondary | ICD-10-CM

## 2024-02-15 DIAGNOSIS — M7918 Myalgia, other site: Secondary | ICD-10-CM | POA: Insufficient documentation

## 2024-02-15 DIAGNOSIS — R0609 Other forms of dyspnea: Secondary | ICD-10-CM | POA: Insufficient documentation

## 2024-02-15 DIAGNOSIS — I10 Essential (primary) hypertension: Secondary | ICD-10-CM

## 2024-02-15 MED ORDER — NITROGLYCERIN 0.4 MG SL SUBL: 0.4000 mg | SUBLINGUAL_TABLET | SUBLINGUAL | 3 refills | Status: AC | PRN

## 2024-02-15 NOTE — Patient Instructions (Signed)
 Medication Instructions:  Your physician recommends that you continue on your current medications as directed. Please refer to the Current Medication list given to you today.   Labwork: BNP to be completed at [facility] in [place]  Testing/Procedures: None  Follow-Up: Your physician recommends that you schedule a follow-up appointment in: 6 months  Any Other Special Instructions Will Be Listed Below (If Applicable). Thank you for choosing [facility]!     If you need a refill on your cardiac medications before your next appointment, please call your pharmacy.

## 2024-02-15 NOTE — Progress Notes (Signed)
 "   Cardiology Office Note  Date: 02/15/2024   ID: [name], [name] 30-Jan-1951, MRN [id number]  PCP:  Trudy Vaughn FALCON, MD  Cardiologist:  Diannah SHAUNNA Maywood, MD Electrophysiologist:  None   History of Present Illness: [name] is a 74 y.o. male  F/u visit of CAD, HLD and chest pain.  Patient had inferior wall MI in 1994.  LHC at that time showed mid RCA CTO with left-to-right collaterals and nonobstructive CAD in LAD and Lcx (20 to 40% stenosis). He had jaw pain during his inferior wall MI in 1994.  He had similar jaw pain bilaterally in 2012 and 2015 where he underwent NM stress test that showed no evidence of inducible myocardial ischemia. He has severe OSA but not using CPAP as he is a side sleeper.  He reported having achiness in the chest radiating to his left shoulder, left neck and left jaw for the last couple of years. Initially less frequent but recently in the last 6 months or so, he has been noticing these episodes getting more frequent with over exertional activities like doing yard work, mowing the lawn, climbing stairs multiple times etc prompting ER visit.  Cardiac workup has been unremarkable.  He also has imaging evidence of hardware in his neck.  When I saw him in June 2025, I started him on ranolazine  500 mg twice daily.  He did not have any more chest pains.  However his insurance did not approve this medication in December 2025 due to which it was discontinued.  He did not want to pay for this medication out of his pocket.  He did not have any recurrence of chest pains despite this.  Doing well now.  He sleeps on his left elbow/left wrist more commonly.  He is a foodie.  He gained 9 pounds in the last few months.  He eats outside food most commonly.  He also reported new onset of DOE and minimal leg swelling.  No orthopnea or PND.  Past Medical History:  Diagnosis Date   Anxiety    Articular cartilage disorder involving shoulder region, left 11/2016   Bursitis of  shoulder, left 11/2016   Complication of anesthesia    hx. of heart rate dropping during 2 surgeries   (2004- back surgery and with Lithotrispy )   Crohn's disease (HCC)    Depression    Dyspnea    with exertion   History of hepatitis    unknown type - 1970s   History of kidney stones    Hyperlipidemia    unable to tolerat statins   Hypertension    states under control with meds., has been on med. since 1994   Immature cataract of both eyes    Limited joint range of motion    cervical spine   Myocardial infarct (HCC) 09/13/1992   Nonobstructive atherosclerosis of coronary artery    Numbness in both hands    Obesity    Osteoarthritis 11/2016   left shoulder   Rotator cuff tear, left 11/2016   Sleep apnea    uses BiPAP, but having issues with mask not sealing    Thyroid  nodule     Past Surgical History:  Procedure Laterality Date   ANAL FISTULOTOMY N/A 06/28/2013   Procedure: ANAL FISTULOTOMY;  Surgeon: Oneil DELENA Budge, MD;  Location: [facility];  Service: General;  Laterality: N/A;   ANAL FISTULOTOMY  2017   ANTERIOR CERVICAL DECOMP/DISCECTOMY FUSION N/A 11/12/2015   Procedure: ANTERIOR CERVICAL  DECOMPRESSION/DISCECTOMY FUSION CERVICAL THREE- CERVICAL FOUR, CERVICAL FOUR- CERVICAL FIVE;  Surgeon: Lamar Peaches, MD;  Location: [facility];  Service: Neurosurgery;  Laterality: N/A;  ANTERIOR CERVICAL DECOMPRESSION/DISCECTOMY FUSION C3-C4, C4-C5   BIOPSY THYROID      CARDIAC CATHETERIZATION  1994, 12/25/2007   a. PTCA alone in 1994 b. RCA CTO w/ L-R collaterals, 40-50% prox RCA, 20% prox LAD; EF 50-55%, inferoapical HK   CARDIAC CATHETERIZATION  04/02/2001   CARDIAC CATHETERIZATION  11/07/2000   CARDIAC CATHETERIZATION  09/24/1996   CARPAL TUNNEL RELEASE Left 10/09/2014   Procedure: LEFT CARPAL TUNNEL RELEASE;  Surgeon: Franky Curia, MD;  Location: [facility];  Service: Orthopedics;  Laterality: Left;   CARPAL TUNNEL RELEASE Right 08/28/2003   CARPAL TUNNEL RELEASE Left  03/02/2021   Procedure: CARPAL TUNNEL RELEASE;  Surgeon: Margrette Taft BRAVO, MD;  Location: [facility];  Service: Orthopedics;  Laterality: Left;   CAUTERY OF TURBINATES  11/05/2003   COLONOSCOPY N/A 11/06/2014   RMR: Skipped Colonic ulcerations with significant ileocecal valve involvement and rectal sparing most consisitant with Crohns disease. Minimal non-steroidal drug use. Probable colonic lipoma   CYSTECTOMY     INCISION AND DRAINAGE ABSCESS Right 05/23/2012   Procedure: INCISION AND DRAINAGE ABSCESS;  Surgeon: Oneil DELENA Budge, MD;  Location: [facility];  Service: General;  Laterality: Right;   KNEE ARTHROSCOPY     LITHOTRIPSY     LUMBAR FUSION  02/18/2002   LUMBAR LAMINECTOMY  02/18/2002   L4-5   NASAL SEPTUM SURGERY  11/05/2003   PARTIAL NEPHRECTOMY Left    age 44   SHOULDER ARTHROSCOPY WITH SUBACROMIAL DECOMPRESSION, ROTATOR CUFF REPAIR AND BICEP TENDON REPAIR Left 11/30/2016   Procedure: LEFT SHOULDER ARTHROSCOPY, DEBRIDEMENT, ACROMIOPLASTY WITH DISTAL CLAVICAL EXCISION, POSSIBLE ROTATOR CUFF REPAIR AND BICEP TENODESIS;  Surgeon: Beverley Toribio FALCON, MD;  Location: [facility];  Service: Orthopedics;  Laterality: Left;   SPLENECTOMY     age 1   TRANSTHORACIC ECHOCARDIOGRAM  03/05/2009   EF >55%, normal LV wall thickness.    Current Outpatient Medications  Medication Sig Dispense Refill   acetaminophen  (TYLENOL ) 650 MG CR tablet Take 1,300 mg by mouth every 8 (eight) hours as needed for pain.     amLODipine  (NORVASC ) 5 MG tablet Take 1 tablet (5 mg total) by mouth daily. 180 tablet 3   Artificial Tear Solution (SOOTHE XP) SOLN Place 1 drop into both eyes 2 (two) times daily as needed (dry eyes).     ascorbic acid  (VITAMIN C) 500 MG tablet Take 1 tablet (500 mg total) by mouth daily. 30 tablet 2   cholecalciferol (VITAMIN D3) 25 MCG (1000 UNIT) tablet Take 1,000 Units by mouth daily.     clotrimazole-betamethasone  (LOTRISONE) cream Apply 1 Application topically 2 (two) times daily as needed.      MELATONIN PO Take 1 tablet by mouth at bedtime as needed (sleep).     metoprolol  succinate (TOPROL -XL) 50 MG 24 hr tablet Take 50 mg by mouth daily. Take with or immediately following a meal.     nitroGLYCERIN  (NITROSTAT ) 0.4 MG SL tablet Place 1 tablet (0.4 mg total) under the tongue every 5 (five) minutes as needed for chest pain. 25 tablet 3   Probiotic Product (PROBIOTIC DAILY PO) Take 1 capsule by mouth daily. SUPREMA DOPHILUS     Psyllium Husk POWD Take 1 Scoop by mouth in the morning.     ramipril  (ALTACE ) 10 MG capsule Take 1 capsule (10 mg total) by mouth daily. Please do NOT  Restart Ramipril  until 12/09/2019 30 capsule 0   traZODone (DESYREL) 50 MG tablet Take 100 mg by mouth at bedtime as needed for sleep.     zinc  sulfate 220 (50 Zn) MG capsule Take 1 capsule (220 mg total) by mouth daily. 30 capsule 0   polyethylene glycol-electrolytes (NULYTELY) 420 g solution Take 4,000 mLs by mouth as directed. 4000 mL 0   No current facility-administered medications for this visit.   Allergies:  Niacin, Penicillins, and Statins   Social History: The patient  reports that he quit smoking about 31 years ago. His smoking use included cigarettes. He started smoking about 49 years ago. He has a 36 pack-year smoking history. He has quit using smokeless tobacco. He reports that he does not drink alcohol and does not use drugs.   Family History: The patient's family history includes Alzheimer's disease in his maternal grandmother; Arthritis/Rheumatoid in his mother; CAD in his brother, brother, and father; COPD in his son; Hyperlipidemia in his mother; Lung disease in his paternal grandfather; Thrombocytopenia in his mother.   ROS:  Please see the history of present illness. Otherwise, complete review of systems is positive for none.  All other systems are reviewed and negative.   Physical Exam: VS:  BP 110/70 (BP Location: Left Arm, Patient Position: Sitting, Cuff Size: Normal)   Pulse 68   Ht 5'  8 (1.727 m)   Wt 223 lb 9.6 oz (101.4 kg)   SpO2 96%   BMI 34.00 kg/m , BMI Body mass index is 34 kg/m.  Wt Readings from Last 3 Encounters:  02/15/24 223 lb 9.6 oz (101.4 kg)  01/30/24 226 lb (102.5 kg)  09/27/23 214 lb 4 oz (97.2 kg)    General: Patient appears comfortable at rest. HEENT: Conjunctiva and lids normal, oropharynx clear with moist mucosa. Neck: Supple, no elevated JVP or carotid bruits, no thyromegaly. Lungs: Clear to auscultation, nonlabored breathing at rest. Cardiac: Regular rate and rhythm, no S3 or significant systolic murmur, no pericardial rub. Abdomen: Soft, nontender, no hepatomegaly, bowel sounds present, no guarding or rebound. Extremities: No pitting edema, distal pulses 2+. Skin: Warm and dry. Musculoskeletal: No kyphosis. Neuropsychiatric: Alert and oriented x3, affect grossly appropriate.  Recent Labwork: 06/24/2023: BUN 13; Creatinine, Ser 0.93; Hemoglobin 15.7; Platelets 365; Potassium 3.5; Sodium 136     Component Value Date/Time   CHOL 117 12/29/2017 0812   TRIG 63 11/12/2019 1200   HDL 34 (L) 12/29/2017 0812   CHOLHDL 5.5 (H) 03/16/2017 0857   CHOLHDL 6.0 06/10/2014 0811   VLDL 34 06/10/2014 0811   LDLCALC 58 12/29/2017 0812     Assessment and Plan:  Musculoskeletal pain: Patient sleeps on his left wrist/left elbow daily.  He also has left shoulder issues.  He did not have any recurrence of chest pain after discontinuing ranolazine .  Will monitor for now.  DOE: He gained 9 pounds of weight in the last 4 months.  He also eats outside food.  He also reported new onset of DOE with no orthopnea or PND.  Has minimal leg swelling but not appreciated today.  Obtain BNP.  Echo from July 2025 showed normal LV function with RWMA (basal inferior segment and basal inferoseptal segment are akinetic), indeterminate diastology, normal RV function, no valvular heart disease and CVP 3 mmHg.  CAD (mid RCA CTO with left-to-right collaterals and  nonobstructive CAD in the remaining vessels in 1994): Denies having any exertional angina now.  Taking aspirin  81 mg once daily at home.  He has Crohn's disease.  No issues so far.  Has been statin intolerant.,  See plan below.  SL NTG 0.4 mg as needed for chest pains.  HLD, not at goal: LDL 173 in July 2024, 160s in 01/2024.  TG levels are normal.  Currently not on any statins.  He tried multiple statins in the past with myopathy.  He also tried Zetia , did not tolerate.  He also tried Repatha  and Praluent , had rash.  I discussed about initiating Leqvio with patient's daughter prefer him to start red yeast rice and recheck lipid panel.  Discussed we will revisit conversation of initiating Leqvio or nexlizet if his LDLs are not at goal with red yeast rice.  He verbalized understanding and agreed to the plan.  HTN, controlled: Continue current antihypertensives, ramipril  10 mg once daily, metoprolol  succinate 50 mg once daily and amlodipine  5 mg once daily.  Severe OSA not on CPAP: He sleeps on his side.  Does not want to use CPAP.  Last follow-up with Dr. Burnard, sleep medicine.  Will have him see Dr. Shlomo.  I spent 30 minutes in reviewing prior medical records, reports, more than 3 labs, discussion and documentation.   Medication Adjustments/Labs and Tests Ordered: Current medicines are reviewed at length with the patient today.  Concerns regarding medicines are outlined above.    Disposition:  Follow up 6 months  Signed, Bernie Fobes Arleta Maywood, MD, 02/15/2024 8:54 AM    [facility] [address] [place], [place] [address]  "

## 2024-02-17 LAB — BRAIN NATRIURETIC PEPTIDE: BNP: 124.7 pg/mL — ABNORMAL HIGH (ref 0.0–100.0)

## 2024-02-19 ENCOUNTER — Encounter (HOSPITAL_COMMUNITY)
Admission: RE | Admit: 2024-02-19 | Discharge: 2024-02-19 | Disposition: A | Discharge status: Home / Self Care | Source: Ambulatory Visit | Attending: Internal Medicine | Admitting: Internal Medicine

## 2024-02-19 ENCOUNTER — Encounter (HOSPITAL_COMMUNITY): Payer: Self-pay

## 2024-02-19 ENCOUNTER — Ambulatory Visit: Payer: Self-pay | Admitting: Internal Medicine

## 2024-02-19 DIAGNOSIS — Z79899 Other long term (current) drug therapy: Secondary | ICD-10-CM

## 2024-02-19 HISTORY — DX: Prediabetes: R73.03

## 2024-02-19 HISTORY — DX: Pneumonia, unspecified organism: J18.9

## 2024-02-20 MED ORDER — FUROSEMIDE 40 MG PO TABS: 40.0000 mg | ORAL_TABLET | Freq: Every day | ORAL | 5 refills | Status: AC

## 2024-02-20 NOTE — Telephone Encounter (Signed)
 The patient has been notified of the result and verbalized understanding.  All questions (if any) were answered. Patient aware his appointment will be changed and that the schedulers will be calling. Littie CHRISTELLA Croak, CMA 02/20/2024 1:32 PM

## 2024-02-20 NOTE — Telephone Encounter (Signed)
-----   Message from Vishnu Mallipeddi, MD sent at 02/19/2024 11:17 AM EST ----- BNP is mildly elevated.  Start p.o. Lasix  40 mg once daily.  Obtain BMP in 5 days.  Schedule follow-up in 3 months instead of 6 months.

## 2024-02-21 ENCOUNTER — Encounter (HOSPITAL_COMMUNITY): Payer: Self-pay | Admitting: Internal Medicine

## 2024-02-21 ENCOUNTER — Telehealth: Payer: Self-pay

## 2024-02-21 ENCOUNTER — Other Ambulatory Visit: Payer: Self-pay

## 2024-02-21 ENCOUNTER — Encounter (HOSPITAL_COMMUNITY): Admission: RE | Disposition: A | Payer: Self-pay | Source: Home / Self Care | Attending: Internal Medicine

## 2024-02-21 ENCOUNTER — Ambulatory Visit (HOSPITAL_COMMUNITY)
Admission: RE | Admit: 2024-02-21 | Discharge: 2024-02-21 | Disposition: A | Discharge status: Home / Self Care | Attending: Internal Medicine | Admitting: Internal Medicine

## 2024-02-21 ENCOUNTER — Ambulatory Visit (HOSPITAL_COMMUNITY): Admitting: Anesthesiology

## 2024-02-21 ENCOUNTER — Encounter (HOSPITAL_COMMUNITY): Admitting: Anesthesiology

## 2024-02-21 DIAGNOSIS — I2510 Atherosclerotic heart disease of native coronary artery without angina pectoris: Secondary | ICD-10-CM | POA: Insufficient documentation

## 2024-02-21 DIAGNOSIS — R1310 Dysphagia, unspecified: Secondary | ICD-10-CM | POA: Diagnosis present

## 2024-02-21 DIAGNOSIS — K2960 Other gastritis without bleeding: Secondary | ICD-10-CM

## 2024-02-21 DIAGNOSIS — K2210 Ulcer of esophagus without bleeding: Secondary | ICD-10-CM | POA: Diagnosis not present

## 2024-02-21 DIAGNOSIS — Z87891 Personal history of nicotine dependence: Secondary | ICD-10-CM | POA: Diagnosis not present

## 2024-02-21 DIAGNOSIS — K449 Diaphragmatic hernia without obstruction or gangrene: Secondary | ICD-10-CM | POA: Insufficient documentation

## 2024-02-21 DIAGNOSIS — K649 Unspecified hemorrhoids: Secondary | ICD-10-CM | POA: Diagnosis not present

## 2024-02-21 DIAGNOSIS — R1030 Lower abdominal pain, unspecified: Secondary | ICD-10-CM | POA: Insufficient documentation

## 2024-02-21 DIAGNOSIS — K5730 Diverticulosis of large intestine without perforation or abscess without bleeding: Secondary | ICD-10-CM | POA: Insufficient documentation

## 2024-02-21 DIAGNOSIS — I252 Old myocardial infarction: Secondary | ICD-10-CM | POA: Diagnosis not present

## 2024-02-21 DIAGNOSIS — K603 Anal fistula, unspecified: Secondary | ICD-10-CM | POA: Diagnosis not present

## 2024-02-21 DIAGNOSIS — I10 Essential (primary) hypertension: Secondary | ICD-10-CM | POA: Insufficient documentation

## 2024-02-21 DIAGNOSIS — G4730 Sleep apnea, unspecified: Secondary | ICD-10-CM | POA: Insufficient documentation

## 2024-02-21 DIAGNOSIS — K269 Duodenal ulcer, unspecified as acute or chronic, without hemorrhage or perforation: Secondary | ICD-10-CM | POA: Diagnosis not present

## 2024-02-21 DIAGNOSIS — K222 Esophageal obstruction: Secondary | ICD-10-CM | POA: Diagnosis not present

## 2024-02-21 DIAGNOSIS — K50113 Crohn's disease of large intestine with fistula: Secondary | ICD-10-CM | POA: Insufficient documentation

## 2024-02-21 DIAGNOSIS — Z1211 Encounter for screening for malignant neoplasm of colon: Secondary | ICD-10-CM | POA: Insufficient documentation

## 2024-02-21 DIAGNOSIS — K56699 Other intestinal obstruction unspecified as to partial versus complete obstruction: Secondary | ICD-10-CM | POA: Insufficient documentation

## 2024-02-21 DIAGNOSIS — K219 Gastro-esophageal reflux disease without esophagitis: Secondary | ICD-10-CM | POA: Diagnosis not present

## 2024-02-21 DIAGNOSIS — M1990 Unspecified osteoarthritis, unspecified site: Secondary | ICD-10-CM | POA: Diagnosis not present

## 2024-02-21 DIAGNOSIS — K2970 Gastritis, unspecified, without bleeding: Secondary | ICD-10-CM | POA: Diagnosis not present

## 2024-02-21 DIAGNOSIS — K259 Gastric ulcer, unspecified as acute or chronic, without hemorrhage or perforation: Secondary | ICD-10-CM | POA: Diagnosis not present

## 2024-02-21 HISTORY — PX: ESOPHAGOGASTRODUODENOSCOPY: SHX5428

## 2024-02-21 HISTORY — PX: ESOPHAGEAL DILATION: SHX303

## 2024-02-21 HISTORY — PX: COLONOSCOPY: SHX5424

## 2024-02-21 LAB — GLUCOSE, CAPILLARY: Glucose-Capillary: 82 mg/dL (ref 70–99)

## 2024-02-21 MED ORDER — PROPOFOL 10 MG/ML IV BOLUS
INTRAVENOUS | Status: DC | PRN
  Administered 2024-02-21: 50 mg via INTRAVENOUS
  Administered 2024-02-21: 100 mg via INTRAVENOUS

## 2024-02-21 MED ORDER — PANTOPRAZOLE SODIUM 40 MG PO TBEC: 40.0000 mg | DELAYED_RELEASE_TABLET | Freq: Every day | ORAL | 11 refills | Status: AC

## 2024-02-21 MED ORDER — LACTATED RINGERS IV SOLN: INTRAVENOUS | Status: DC | PRN

## 2024-02-21 MED ORDER — LIDOCAINE 2% (20 MG/ML) 5 ML SYRINGE
INTRAMUSCULAR | Status: DC | PRN
  Administered 2024-02-21: 50 mg via INTRAVENOUS

## 2024-02-21 MED ORDER — PROPOFOL 500 MG/50ML IV EMUL
INTRAVENOUS | Status: DC | PRN
  Administered 2024-02-21: 150 ug/kg/min via INTRAVENOUS

## 2024-02-21 NOTE — Anesthesia Preprocedure Evaluation (Signed)
"                                    Anesthesia Evaluation  Patient identified by MRN, date of birth, ID band Patient awake    Reviewed: Allergy & Precautions, H&P , NPO status , Patient's Chart, lab work & pertinent test results, reviewed documented beta blocker date and time   History of Anesthesia Complications (+) history of anesthetic complications  Airway Mallampati: II  TM Distance: >3 FB Neck ROM: full    Dental no notable dental hx.    Pulmonary neg pulmonary ROS, shortness of breath, sleep apnea , pneumonia, former smoker   Pulmonary exam normal breath sounds clear to auscultation       Cardiovascular Exercise Tolerance: Good hypertension, + CAD, + Past MI and + DOE  negative cardio ROS  Rhythm:regular Rate:Normal     Neuro/Psych  PSYCHIATRIC DISORDERS Anxiety Depression     Neuromuscular disease negative neurological ROS  negative psych ROS   GI/Hepatic negative GI ROS, Neg liver ROS,,,  Endo/Other  negative endocrine ROS    Renal/GU negative Renal ROS  negative genitourinary   Musculoskeletal  (+) Arthritis ,    Abdominal   Peds  Hematology negative hematology ROS (+)   Anesthesia Other Findings   Reproductive/Obstetrics negative OB ROS                              Anesthesia Physical Anesthesia Plan  ASA: 3  Anesthesia Plan: MAC   Post-op Pain Management:    Induction:   PONV Risk Score and Plan: Propofol  infusion  Airway Management Planned:   Additional Equipment:   Intra-op Plan:   Post-operative Plan:   Informed Consent: I have reviewed the patients History and Physical, chart, labs and discussed the procedure including the risks, benefits and alternatives for the proposed anesthesia with the patient or authorized representative who has indicated his/her understanding and acceptance.     Dental Advisory Given  Plan Discussed with: CRNA  Anesthesia Plan Comments:          Anesthesia Quick Evaluation  "

## 2024-02-21 NOTE — Discharge Instructions (Signed)
 EGD Discharge instructions Please read the instructions outlined below and refer to this sheet in the next few weeks. These discharge instructions provide you with general information on caring for yourself after you leave the hospital. Your doctor may also give you specific instructions. While your treatment has been planned according to the most current medical practices available, unavoidable complications occasionally occur. If you have any problems or questions after discharge, please call your doctor. ACTIVITY You may resume your regular activity but move at a slower pace for the next 24 hours.  Take frequent rest periods for the next 24 hours.  Walking will help expel (get rid of) the air and reduce the bloated feeling in your abdomen.  No driving for 24 hours (because of the anesthesia (medicine) used during the test).  You may shower.  Do not sign any important legal documents or operate any machinery for 24 hours (because of the anesthesia used during the test).  NUTRITION Drink plenty of fluids.  You may resume your normal diet.  Begin with a light meal and progress to your normal diet.  Avoid alcoholic beverages for 24 hours or as instructed by your caregiver.  MEDICATIONS You may resume your normal medications unless your caregiver tells you otherwise.  WHAT YOU CAN EXPECT TODAY You may experience abdominal discomfort such as a feeling of fullness or gas pains.  FOLLOW-UP Your doctor will discuss the results of your test with you.  SEEK IMMEDIATE MEDICAL ATTENTION IF ANY OF THE FOLLOWING OCCUR: Excessive nausea (feeling sick to your stomach) and/or vomiting.  Severe abdominal pain and distention (swelling).  Trouble swallowing.  Temperature over 101 F (37.8 C).  Rectal bleeding or vomiting of blood.    Colonoscopy Discharge Instructions  Read the instructions outlined below and refer to this sheet in the next few weeks. These discharge instructions provide you with  general information on caring for yourself after you leave the hospital. Your doctor may also give you specific instructions. While your treatment has been planned according to the most current medical practices available, unavoidable complications occasionally occur. If you have any problems or questions after discharge, call Dr. Shaaron at [phone]. ACTIVITY You may resume your regular activity, but move at a slower pace for the next 24 hours.  Take frequent rest periods for the next 24 hours.  Walking will help get rid of the air and reduce the bloated feeling in your belly (abdomen).  No driving for 24 hours (because of the medicine (anesthesia) used during the test).   Do not sign any important legal documents or operate any machinery for 24 hours (because of the anesthesia used during the test).  NUTRITION Drink plenty of fluids.  You may resume your normal diet as instructed by your doctor.  Begin with a light meal and progress to your normal diet. Heavy or fried foods are harder to digest and may make you feel sick to your stomach (nauseated).  Avoid alcoholic beverages for 24 hours or as instructed.  MEDICATIONS You may resume your normal medications unless your doctor tells you otherwise.  WHAT YOU CAN EXPECT TODAY Some feelings of bloating in the abdomen.  Passage of more gas than usual.  Spotting of blood in your stool or on the toilet paper.  IF YOU HAD POLYPS REMOVED DURING THE COLONOSCOPY: No aspirin  products for 7 days or as instructed.  No alcohol for 7 days or as instructed.  Eat a soft diet for the next 24 hours.  FINDING  OUT THE RESULTS OF YOUR TEST Not all test results are available during your visit. If your test results are not back during the visit, make an appointment with your caregiver to find out the results. Do not assume everything is normal if you have not heard from your caregiver or the medical facility. It is important for you to follow up on all of your test  results.  SEEK IMMEDIATE MEDICAL ATTENTION IF: You have more than a spotting of blood in your stool.  Your belly is swollen (abdominal distention).  You are nauseated or vomiting.  You have a temperature over 101.  You have abdominal pain or discomfort that is severe or gets worse throughout the day.      Found acid reflux and a Schatzki's ring along with stomach inflammation esophagus stretched.  Biopsies taken      begin Protonix  40 mg once daily best taken 30 minutes before breakfast for acid reflux.  My office has already called in a prescription to your pharmacy  Fistula identified in the rectum.  Diverticulosis throughout the colon no polyps.  The small intestine appeared abnormal suspicious for Crohn's disease-biopsies taken  My office will schedule an MRI of the pelvis to further evaluate for fistula disease.  Office follow-up with us  in 4 weeks  Further recommendations to follow  I called Beverly Hanks to discuss.  Call rolled to voicemail.  Left a detailed message at [phone]

## 2024-02-21 NOTE — Telephone Encounter (Signed)
-----   Message from Lamar Hollingshead, MD sent at 02/21/2024 12:17 PM EST -----  needs 4-week office appointment with Sonny Kerns  New prescription for Protonix  40 mg pill.  Dispense 30.  Take 1 daily 30 minutes before breakfast.  11 refills.  Please schedule an MRI of the pelvis to further evaluate for possible PERI anal/perirectal fistula seen at colonoscopy.

## 2024-02-21 NOTE — Telephone Encounter (Signed)
 Rx sent to pharmacy on file.

## 2024-02-21 NOTE — Op Note (Signed)
 [facility] Patient Name: [name] Procedure Date: 02/21/2024 11:08 AM MRN: [id number] Date of Birth: 26-Sep-1950 Attending MD: [name] , MD, [id number] CSN: [id number] Age: 74 Admit Type: Outpatient Procedure:                Colonoscopy Indications:              High risk colon cancer surveillance: Crohn's                            colitis of 8 (or more) years duration with                            one-third (or more) of the colon involved Providers:                [name], MD, Devere Lodge, Daphne Mulch                            Technician, Technician Referring MD:              Medicines:                Propofol  per Anesthesia Complications:            No immediate complications. Estimated Blood Loss:     Estimated blood loss was minimal. Procedure:                Pre-Anesthesia Assessment:                           - Prior to the procedure, a History and Physical                            was performed, and patient medications and                            allergies were reviewed. The patient's tolerance of                            previous anesthesia was also reviewed. The risks                            and benefits of the procedure and the sedation                            options and risks were discussed with the patient.                            All questions were answered, and informed consent                            was obtained. Prior Anticoagulants: The patient has                            taken no anticoagulant or antiplatelet agents. ASA  Grade Assessment: III - A patient with severe                            systemic disease. After reviewing the risks and                            benefits, the patient was deemed in satisfactory                            condition to undergo the procedure.                           After obtaining informed consent, the colonoscope                             was passed under direct vision. Throughout the                            procedure, the patient's blood pressure, pulse, and                            oxygen saturations were monitored continuously. The                            CF-[id number] ([id number]) Colon was introduced through                            the anus and advanced to the the cecum, identified                            by appendiceal orifice and ileocecal valve. The                            colonoscopy was performed without difficulty. The                            patient tolerated the procedure well. The quality                            of the bowel preparation was adequate. The                            ileocecal valve, appendiceal orifice, and rectum                            were photographed. Scope In: 11:41:02 AM Scope Out: 12:00:45 PM Scope Withdrawal Time: 0 hours 14 minutes 19 seconds  Total Procedure Duration: 0 hours 19 minutes 43 seconds  Findings:      The perianal and digital rectal examinations were normal.      Scattered medium-mouthed diverticula were found in the entire colon.       Fatty appearing ileocecal valve. The very end of the opening of the       terminal ileum was stenotic and markedly inflamed; unable to deeply  intubate the terminal ileum however biopsies of the terminal ileum were       taken. Impression:               - Diverticulosis in the entire examined colon.                            Ileocecal valve/opening to the terminal                            ileumbiopsied- No specimens collected..                           - Perianal hemorrhoids with possible adjacent                            fistulous opening. Moderate Sedation:      Moderate (conscious) sedation was personally administered by an       anesthesia professional. The following parameters were monitored: oxygen       saturation, heart rate, blood pressure, respiratory rate, EKG, adequacy       of pulmonary  ventilation, and response to care. Recommendation:           - Patient has a contact number available for                            emergencies. The signs and symptoms of potential                            delayed complications were discussed with the                            patient. Return to normal activities tomorrow.                            Written discharge instructions were provided to the                            patient.                           - Advance diet as tolerated. Follow-up on                            pathology. MRI of pelvis to further evaluate for                            the presence of a fistula. Office visit in 4 weeks.                            See EGD report. Procedure Code(s):        --- Professional ---                           [phone], Colonoscopy, flexible; diagnostic, including  collection of specimen(s) by brushing or washing,                            when performed (separate procedure) Diagnosis Code(s):        --- Professional ---                           K50.10, Crohn's disease of large intestine without                            complications                           K57.30, Diverticulosis of large intestine without                            perforation or abscess without bleeding CPT copyright 2022 [employer]. All rights reserved. The codes documented in this report are preliminary and upon coder review may  be revised to meet current compliance requirements. Lamar HERO. Thresea Doble, MD [name], MD 02/21/2024 12:13:09 PM This report has been signed electronically. Number of Addenda: 0

## 2024-02-21 NOTE — Transfer of Care (Signed)
 Immediate Anesthesia Transfer of Care Note  Patient: [name]  Procedure(s) Performed: COLONOSCOPY EGD (ESOPHAGOGASTRODUODENOSCOPY) DILATION, ESOPHAGUS  Patient Location: Short Stay  Anesthesia Type:General  Level of Consciousness: awake  Airway & Oxygen Therapy: Patient Spontanous Breathing  Post-op Assessment: Report given to RN  Post vital signs: Reviewed and stable  Last Vitals:  Vitals Value Taken Time  BP 97/37 02/21/24 12:06  Temp 36.7 C 02/21/24 12:06  Pulse 73 02/21/24 12:06  Resp 17 02/21/24 12:06  SpO2 95 % 02/21/24 12:06    Last Pain:  Vitals:   02/21/24 1206  TempSrc: Oral  PainSc: 0-No pain      Patients Stated Pain Goal: 7 (02/21/24 1049)  Complications: No notable events documented.

## 2024-02-21 NOTE — Interval H&P Note (Signed)
 History and Physical Interval Note:  02/21/2024 11:19 AM  [name]  has presented today for surgery, with the diagnosis of dysphagia, crohns, anal fistula.  The various methods of treatment have been discussed with the patient and family. After consideration of risks, benefits and other options for treatment, the patient has consented to  Procedures with comments: COLONOSCOPY (N/A) - 10:15am, asa 3 (ok rm 1) EGD (ESOPHAGOGASTRODUODENOSCOPY) (N/A) DILATION, ESOPHAGUS (N/A) as a surgical intervention.  The patient's history has been reviewed, patient examined, no change in status, stable for surgery.  I have reviewed the patient's chart and labs.  Questions were answered to the patient's satisfaction.     [name]   no change.  Patient with esophageal dysphagia sketchy history of Crohn's colitis possible perianal fistula.  Colonoscopy now being to update disease.  EGD with potential esophageal dilation as feasible/appropriate today per plan.  The risks, benefits, limitations, imponderables and alternatives regarding both EGD and colonoscopy have been reviewed with the patient. Questions have been answered. All parties agreeable.

## 2024-02-21 NOTE — Telephone Encounter (Signed)
 Checked [employer] and no PA required MRI pelvis scheduled 02/24/24, Saturday, arrival 11:00am, register through radiology at [facility]  Called pt, LMOVM to call back

## 2024-02-21 NOTE — Addendum Note (Signed)
 Addended by: JEANELL GRAEME RAMAN on: 02/21/2024 01:28 PM   Modules accepted: Orders

## 2024-02-21 NOTE — Op Note (Signed)
 [facility] Patient Name: [name] Procedure Date: 02/21/2024 11:11 AM MRN: [id number] Date of Birth: 01-15-1951 Attending MD: [name] , MD, [id number] CSN: [id number] Age: 74 Admit Type: Outpatient Procedure:                Upper GI endoscopy Indications:              Dysphagia Providers:                [name], MD, Devere Lodge, Daphne Mulch                            Technician, Technician Referring MD:              Medicines:                Propofol  per Anesthesia Complications:            No immediate complications. Estimated Blood Loss:     Estimated blood loss was minimal. Procedure:                Pre-Anesthesia Assessment:                           - Prior to the procedure, a History and Physical                            was performed, and patient medications and                            allergies were reviewed. The patient's tolerance of                            previous anesthesia was also reviewed. The risks                            and benefits of the procedure and the sedation                            options and risks were discussed with the patient.                            All questions were answered, and informed consent                            was obtained. Prior Anticoagulants: The patient has                            taken no anticoagulant or antiplatelet agents. ASA                            Grade Assessment: III - A patient with severe                            systemic disease. After reviewing the risks and  benefits, the patient was deemed in satisfactory                            condition to undergo the procedure.                           After obtaining informed consent, the endoscope was                            passed under direct vision. Throughout the                            procedure, the patient's blood pressure, pulse, and                            oxygen saturations  were monitored continuously. The                            [id number] ([id number]) Upper was introduced through                            the mouth, and advanced to the second part of                            duodenum. The upper GI endoscopy was accomplished                            without difficulty. The patient tolerated the                            procedure well. Scope In: 11:26:44 AM Scope Out: 11:35:57 AM Total Procedure Duration: 0 hours 9 minutes 13 seconds  Findings:      A moderate Schatzki ring was found at the gastroesophageal junction.       Superimposed distal 5 mm esophageal erosion straddling the       ringmultiple. No Barrett's epithelium or tumor seen.      A small hiatal hernia was present. Scattered gastric/antral erosions no       ulcer or infiltrating process seen pylorus patent. Examination of bulb       second portion revealed additional erosions in the bulb otherwise D1 and       D2 appeared normal.      The scope was withdrawn. Dilation was performed with a Maloney dilator       with moderate resistance at 56 Fr. The dilation site was examined       following endoscope reinsertion and showed no change. Estimated blood       loss: none. Subsequently, four-quadrant bites with a cold biopsy forceps       taken to disrupt the ring. This was done effectively and without       apparent complication. Finally, biopsies of the abnormal appearing       gastric mucosa taken for histologic study. Impression:               - Moderate Schatzki ring. Erosive reflux dilated  and disrupted                           - Small hiatal hernia. Gastric and duodenal                            erosionsstatus post biopsy Moderate Sedation:      Moderate (conscious) sedation was personally administered by an       anesthesia professional. The following parameters were monitored: oxygen       saturation, heart rate, blood pressure, respiratory rate,  EKG, adequacy       of pulmonary ventilation, and response to care. Recommendation:           - Patient has a contact number available for                            emergencies. The signs and symptoms of potential                            delayed complications were discussed with the                            patient. Return to normal activities tomorrow.                            Written discharge instructions were provided to the                            patient.                           - Advance diet as tolerated. Begin Protonix  40 mg                            daily. Follow-up on pathology. Further                            recommendations to follow. office follow-up                            ointment in 6 weeks. See colonoscopy report. Procedure Code(s):        --- Professional ---                           [phone], Esophagogastroduodenoscopy, flexible,                            transoral; diagnostic, including collection of                            specimen(s) by brushing or washing, when performed                            (separate procedure)                           43450,  Dilation of esophagus, by unguided sound or                            bougie, single or multiple passes Diagnosis Code(s):        --- Professional ---                           K22.2, Esophageal obstruction                           K44.9, Diaphragmatic hernia without obstruction or                            gangrene                           R13.10, Dysphagia, unspecified CPT copyright 2022 American Medical Association. All rights reserved. The codes documented in this report are preliminary and upon coder review may  be revised to meet current compliance requirements. [name], MD [name], MD 02/21/2024 12:08:01 PM This report has been signed electronically. Number of Addenda: 0

## 2024-02-21 NOTE — Interval H&P Note (Signed)
 History and Physical Interval Note:  02/21/2024 10:33 AM  [name]  has presented today for surgery, with the diagnosis of dysphagia, crohns, anal fistula.  The various methods of treatment have been discussed with the patient and family. After consideration of risks, benefits and other options for treatment, the patient has consented to  Procedures with comments: COLONOSCOPY (N/A) - 10:15am, asa 3 (ok rm 1) EGD (ESOPHAGOGASTRODUODENOSCOPY) (N/A) DILATION, ESOPHAGUS (N/A) as a surgical intervention.  The patient's history has been reviewed, patient examined, no change in status, stable for surgery.  I have reviewed the patient's chart and labs.  Questions were answered to the patient's satisfaction.     Lamar Hollingshead    Patient here for further evaluation dysphagia via EGD with esophageal dilation is feasible/appropriate.  History of Crohn's colitis previously possible fistula formation.  Here for an updated ileocolonoscopy per plan.  The risks, benefits, limitations, imponderables and alternatives regarding both EGD and colonoscopy have been reviewed with the patient. Questions have been answered. All parties agreeable.

## 2024-02-22 ENCOUNTER — Encounter (HOSPITAL_COMMUNITY): Payer: Self-pay | Admitting: Internal Medicine

## 2024-02-22 NOTE — Telephone Encounter (Signed)
LMOVM to call back for pt ?

## 2024-02-23 LAB — SURGICAL PATHOLOGY

## 2024-02-23 NOTE — Anesthesia Postprocedure Evaluation (Signed)
"   Anesthesia Post Note  Patient: [name]  Procedure(s) Performed: COLONOSCOPY EGD (ESOPHAGOGASTRODUODENOSCOPY) DILATION, ESOPHAGUS  Patient location during evaluation: Phase II Anesthesia Type: MAC Level of consciousness: awake Pain management: pain level controlled Vital Signs Assessment: post-procedure vital signs reviewed and stable Respiratory status: spontaneous breathing and respiratory function stable Cardiovascular status: blood pressure returned to baseline and stable Postop Assessment: no headache and no apparent nausea or vomiting Anesthetic complications: no Comments: Late entry   No notable events documented.   Last Vitals:  Vitals:   02/21/24 1206 02/21/24 1208  BP: (!) 97/37 (!) 106/46  Pulse: 73   Resp: 17   Temp: 36.7 C   SpO2: 95%     Last Pain:  Vitals:   02/21/24 1206  TempSrc: Oral  PainSc: 0-No pain                 Yvonna PARAS Nikeisha Klutz      "

## 2024-02-24 ENCOUNTER — Ambulatory Visit (HOSPITAL_COMMUNITY)
Admission: RE | Admit: 2024-02-24 | Discharge: 2024-02-24 | Disposition: A | Source: Ambulatory Visit | Attending: Internal Medicine | Admitting: Internal Medicine

## 2024-02-24 DIAGNOSIS — K603 Anal fistula, unspecified: Secondary | ICD-10-CM | POA: Insufficient documentation

## 2024-02-24 MED ORDER — GADOBUTROL 1 MMOL/ML IV SOLN
10.0000 mL | Freq: Once | INTRAVENOUS | Status: AC | PRN
  Administered 2024-02-24: 10 mL via INTRAVENOUS

## 2024-02-26 ENCOUNTER — Ambulatory Visit: Payer: Self-pay | Admitting: Internal Medicine

## 2024-02-26 NOTE — Telephone Encounter (Signed)
 Pt was made aware and verbalized understanding. MRI of Pelvis was completed on 02/24/24 await results.

## 2024-02-28 ENCOUNTER — Ambulatory Visit: Payer: Self-pay | Admitting: Internal Medicine

## 2024-03-26 ENCOUNTER — Institutional Professional Consult (permissible substitution) (HOSPITAL_BASED_OUTPATIENT_CLINIC_OR_DEPARTMENT_OTHER): Admitting: Internal Medicine

## 2024-03-27 ENCOUNTER — Ambulatory Visit: Admitting: Gastroenterology

## 2024-05-27 ENCOUNTER — Ambulatory Visit: Admitting: Internal Medicine
